# Patient Record
Sex: Male | Born: 1940
Health system: Southern US, Community
[De-identification: ages and names within clinical notes are randomized; demographics above are authoritative.]

## PROBLEM LIST (undated history)

## (undated) DIAGNOSIS — K089 Disorder of teeth and supporting structures, unspecified: Secondary | ICD-10-CM

## (undated) DIAGNOSIS — H7013 Chronic mastoiditis, bilateral: Secondary | ICD-10-CM

## (undated) DIAGNOSIS — E041 Nontoxic single thyroid nodule: Secondary | ICD-10-CM

## (undated) DIAGNOSIS — N189 Chronic kidney disease, unspecified: Secondary | ICD-10-CM

## (undated) DIAGNOSIS — E785 Hyperlipidemia, unspecified: Secondary | ICD-10-CM

## (undated) DIAGNOSIS — M272 Inflammatory conditions of jaws: Secondary | ICD-10-CM

## (undated) DIAGNOSIS — M549 Dorsalgia, unspecified: Secondary | ICD-10-CM

## (undated) DIAGNOSIS — F419 Anxiety disorder, unspecified: Secondary | ICD-10-CM

## (undated) DIAGNOSIS — I10 Essential (primary) hypertension: Secondary | ICD-10-CM

## (undated) DIAGNOSIS — F17210 Nicotine dependence, cigarettes, uncomplicated: Secondary | ICD-10-CM

## (undated) DIAGNOSIS — J449 Chronic obstructive pulmonary disease, unspecified: Secondary | ICD-10-CM

## (undated) DIAGNOSIS — M109 Gout, unspecified: Secondary | ICD-10-CM

## (undated) HISTORY — DX: Morbid (severe) obesity due to excess calories: E66.01

## (undated) HISTORY — PX: TONSILLECTOMY: SUR1361

## (undated) HISTORY — DX: Nontoxic single thyroid nodule: E04.1

## (undated) HISTORY — DX: Essential (primary) hypertension: I10

## (undated) HISTORY — DX: Chronic mastoiditis, bilateral: H70.13

## (undated) HISTORY — DX: Gout, unspecified: M10.9

## (undated) HISTORY — DX: Anxiety disorder, unspecified: F41.9

## (undated) HISTORY — DX: Hyperlipidemia, unspecified: E78.5

## (undated) HISTORY — DX: Inflammatory conditions of jaws: M27.2

## (undated) HISTORY — PX: APPENDECTOMY: SHX54

## (undated) HISTORY — DX: Disorder of teeth and supporting structures, unspecified: K08.9

## (undated) HISTORY — DX: Chronic obstructive pulmonary disease, unspecified: J44.9

## (undated) HISTORY — DX: Nicotine dependence, cigarettes, uncomplicated: F17.210

## (undated) HISTORY — DX: Dorsalgia, unspecified: M54.9

---

## 2003-05-17 ENCOUNTER — Ambulatory Visit (HOSPITAL_COMMUNITY): Admission: RE | Admit: 2003-05-17 | Discharge: 2003-05-17 | Payer: Self-pay | Admitting: Family Medicine

## 2006-07-22 ENCOUNTER — Ambulatory Visit: Payer: Self-pay | Admitting: Cardiology

## 2006-07-30 ENCOUNTER — Ambulatory Visit: Payer: Self-pay

## 2010-08-06 NOTE — Assessment & Plan Note (Signed)
[facility]                            CARDIOLOGY OFFICE NOTE   NAME:Marcus Carr, Marcus Carr                   MRN:          [id number]  DATE:07/22/2006                            DOB:          1940/09/02    PRIMARY CARE PHYSICIAN:  Lindaann Pascal P.A.   REASON FOR PRESENTATION:  Patient with dyspnea and multiple  cardiovascular risk factors.   HISTORY OF PRESENT ILLNESS:  The patient is a 70 year old gentleman  without prior cardiac history.  He has significant cardiovascular risk  factors. He has had some dyspnea.  He notices this doing moderate  activity or activity such as climbing a flight of stairs.  He has been  very limited over the years because of spinal stenosis and he is on  disability for this.  He thinks his dyspnea has slowly been progressive.  He does not describe resting shortness of breath.  He does not have PND  or orthopnea.  He has had palpations long standing but these are  fleeting and sporadic. They are not associated with presyncope or  syncope.  He does not describe chest pressure, neck or arm discomfort.   PAST MEDICAL HISTORY:  Hypertension  x20 years, hyperlipidemia x20  years, gout, depression x10 years, panic attacks.   PAST SURGICAL HISTORY:  Tonsillectomy, adenoidectomy, appendectomy, skin  cancer resected.   ALLERGIES:  PENICILLIN.   MEDICATIONS:  1. Oxycodone.  2. Advicor 1000/20 daily.  3. Hydrochlorothiazide 12.5 mg daily.  4. Lexapro 10 mg daily.  5. Aspirin 325 mg daily.  6. Fish oil 1000 mg daily.  7. Verapamil 240 mg b.i.d.  8. Uric acid.  9. Stool softener.  10.Chantix.   SOCIAL HISTORY:  The patient is disabled.  He is married.  He has  children.  He has been smoking 1-1/2 packs a day of cigarettes for 40  years.   FAMILY HISTORY:  Is contributory for his father having multiple  myocardial infarctions and dying with heart failure at age 54.  He has a  brother who apparently had heart failure, but by  the patient's report,  died with a dissected thoracic aneurysm at age 45.   REVIEW OF SYSTEMS:  As stated in the HPI and negative for other systems.   PHYSICAL EXAMINATION:  The patient is in no distress.  Blood pressure 134/80, heart rate 66 and regular.  Weight 250 pounds.  Body mass index 37.  HEENT:  Eyes unremarkable. Pupils are equal, round and react to light,  fundi not visualized. Oral mucosa unremarkable.  NECK:  No jugular venous distention, wave form within normal limits,  carotid upstroke brisk and symmetric. No bruits, no thyromegaly.  LYMPHATICS:  No cervical, axillary, inguinal adenopathy.  LUNGS:  Clear to auscultation bilaterally.  BACK:  No costovertebral angle tenderness.  CHEST:  Unremarkable.  HEART:  PMI not displaced or sustained, S1 and S2 within normal limits.  No S3, No S4, no clicks, no rubs, no murmurs.  ABDOMEN:  Obese, positive bowel sounds.  Normal in frequency and pitch,  no bruits, no rebound, no guarding, no midline pulsatile mass, no  organomegaly.  SKIN:  No rashes.  No nodules.  EXTREMITIES:  2+ pulses, no cyanosis, no clubbing, no edema.  NEURO:  Oriented to person, place and time.  Cranial nerves II through  XII grossly intact, motor grossly intact.   EKG sinus rhythm, rate 66, axis within normal limits. Intervals within  normal limits, no acute ST-T wave changes.   ASSESSMENT AND PLAN:  1. Dyspnea.  The patient has dyspnea which is probably related to his      deconditioning and smoking.  However, it could be an anginal      equivalent.  It has been slowly progressive.  It happens with      minimal activity.  He has multiple severe cardiovascular risk      factors.  He would need to be screened with a stress test.      However, he could not walk on a treadmill because of back pain.      Therefore, he will have an adenosine Cardiolite.  Further      evaluation based on these results.  2. Tobacco.  We discussed the need to stop smoking.  He  is taking      Chantix.  Hopefully he can commit to this.  3. Obesity.  He understands the need to lose weight with diet and      exercise.  I am asking him to look into whether he has access to a      swimming pool where he could exercise despite his back pain.      Otherwise he will need a more restrictive diet and we have      discussed this.  4. Hypertension.  Blood pressure is well controlled.  He will continue      the medications as listed.  5. Dyslipidemia per Mr. Jacqulyn Bath.  6. Followup.  I will see the patient back as needed.    Rollene Rotunda, MD, [facility]  Electronically Signed   JH/MedQ  DD: 07/22/2006  DT: 07/22/2006  Job #: [id number]   cc:   Lindaann Pascal, P.A.

## 2012-09-14 ENCOUNTER — Other Ambulatory Visit: Payer: Self-pay | Admitting: Nurse Practitioner

## 2012-11-30 ENCOUNTER — Other Ambulatory Visit: Payer: Self-pay | Admitting: Nurse Practitioner

## 2012-11-30 NOTE — Telephone Encounter (Signed)
Last seen 2/14 [facility]   

## 2012-12-01 NOTE — Telephone Encounter (Signed)
Prescription renewed in EPIC. 

## 2012-12-02 NOTE — Telephone Encounter (Signed)
Rx done, pt aware 

## 2012-12-07 ENCOUNTER — Other Ambulatory Visit: Payer: Self-pay

## 2012-12-07 NOTE — Telephone Encounter (Signed)
Last lipids 04/27/12  [facility]

## 2012-12-08 ENCOUNTER — Other Ambulatory Visit: Payer: Self-pay | Admitting: Nurse Practitioner

## 2012-12-09 NOTE — Telephone Encounter (Signed)
Last lipid 04/27/12  [facility]

## 2012-12-10 MED ORDER — SIMVASTATIN 40 MG PO TABS: 40.0000 mg | ORAL_TABLET | Freq: Every day | ORAL | Status: DC

## 2012-12-16 ENCOUNTER — Ambulatory Visit (INDEPENDENT_AMBULATORY_CARE_PROVIDER_SITE_OTHER): Payer: Medicare Other | Admitting: Family Medicine

## 2012-12-16 ENCOUNTER — Encounter: Payer: Self-pay | Admitting: Family Medicine

## 2012-12-16 VITALS — BP 124/71 | HR 59 | Temp 96.7°F | Ht 67.5 in | Wt 237.0 lb

## 2012-12-16 DIAGNOSIS — F32A Depression, unspecified: Secondary | ICD-10-CM

## 2012-12-16 DIAGNOSIS — F329 Major depressive disorder, single episode, unspecified: Secondary | ICD-10-CM

## 2012-12-16 DIAGNOSIS — M109 Gout, unspecified: Secondary | ICD-10-CM

## 2012-12-16 DIAGNOSIS — E785 Hyperlipidemia, unspecified: Secondary | ICD-10-CM

## 2012-12-16 DIAGNOSIS — F3289 Other specified depressive episodes: Secondary | ICD-10-CM

## 2012-12-16 DIAGNOSIS — I10 Essential (primary) hypertension: Secondary | ICD-10-CM

## 2012-12-16 LAB — POCT CBC
Granulocyte percent: 61.9 %G (ref 37–80)
HCT, POC: 44 % (ref 43.5–53.7)
Hemoglobin: 14.2 g/dL (ref 14.1–18.1)
Lymph, poc: 1.4 (ref 0.6–3.4)
MCH, POC: 29.8 pg (ref 27–31.2)
MCHC: 32.4 g/dL (ref 31.8–35.4)
MCV: 92 fL (ref 80–97)
MPV: 6.6 fL (ref 0–99.8)
POC Granulocyte: 2.8 (ref 2–6.9)
POC LYMPH PERCENT: 31.1 %L (ref 10–50)
Platelet Count, POC: 129 10*3/uL — AB (ref 142–424)
RBC: 4.8 M/uL (ref 4.69–6.13)
RDW, POC: 14 %
WBC: 4.6 10*3/uL (ref 4.6–10.2)

## 2012-12-16 MED ORDER — HYDROCHLOROTHIAZIDE 25 MG PO TABS: 25.0000 mg | ORAL_TABLET | Freq: Every day | ORAL | Status: DC

## 2012-12-16 MED ORDER — ALLOPURINOL 300 MG PO TABS: 300.0000 mg | ORAL_TABLET | Freq: Every day | ORAL | Status: DC

## 2012-12-16 MED ORDER — SIMVASTATIN 40 MG PO TABS: 40.0000 mg | ORAL_TABLET | Freq: Every day | ORAL | Status: DC

## 2012-12-16 MED ORDER — VERAPAMIL HCL ER 240 MG PO CP24: 240.0000 mg | ORAL_CAPSULE | Freq: Every day | ORAL | Status: DC

## 2012-12-16 MED ORDER — CITALOPRAM HYDROBROMIDE 20 MG PO TABS: 20.0000 mg | ORAL_TABLET | Freq: Every day | ORAL | Status: DC

## 2012-12-16 MED ORDER — NIACIN ER (ANTIHYPERLIPIDEMIC) 1000 MG PO TBCR: 1000.0000 mg | EXTENDED_RELEASE_TABLET | Freq: Every day | ORAL | Status: DC

## 2012-12-16 NOTE — Progress Notes (Signed)
  Subjective:    Patient ID: Marcus Carr, male    DOB: [birth year], 72 y.o.   MRN: [id number]  HPI  This 72 y.o. male presents for evaluation of hypertension, hyperlipidemia, gout, obesity, Tobacco abuse, and depression.  He states his depression is under control.  He has Been doing fine with gout and has not had any flares.  His last uric acid was elevated At 7.2 but has not had any gout flares and takes allopurinol 300mg  po qd.  He had a cardiac W/u 5 years ago he states by [employer] cardiolgy.  He states he still does not want a colonoscopy. He needs labs and refills on meds.  He has no acute c/o. He denies SOB, hemoptysis and chest pain.  Review of Systems    No chest pain, SOB, HA, dizziness, vision change, N/V, diarrhea, constipation, dysuria, urinary urgency or frequency, myalgias, arthralgias or rash.  Objective:   Physical Exam Vital signs noted  Well developed well nourished male.  HEENT - Head atraumatic Normocephalic                Eyes - PERRLA, Conjuctiva - clear Sclera- Clear EOMI                Ears - EAC's Wnl TM's Wnl Gross Hearing WNL                Nose - Nares patent                 Throat - oropharanx wnl Respiratory - Lungs CTA bilateral Cardiac - RRR S1 and S2 without murmur GI - Abdomen soft Nontender and bowel sounds active x 4 Extremities - No edema. Neuro - Grossly intact.       Assessment & Plan:  Essential hypertension, benign - Plan: POCT CBC, Thyroid Panel With TSH, CMP14+EGFR  Other and unspecified hyperlipidemia - Plan: simvastatin (ZOCOR) 40 MG tablet, niacin (NIASPAN) 1000 MG CR tablet, Thyroid Panel With TSH, Lipid panel, CMP14+EGFR  Unspecified essential hypertension - Plan: hydrochlorothiazide (HYDRODIURIL) 25 MG tablet, verapamil (VERELAN PM) 240 MG 24 hr capsule, Thyroid Panel With TSH  Depression - Plan: citalopram (CELEXA) 20 MG tablet, Thyroid Panel With TSH  Gout - Plan: allopurinol (ZYLOPRIM) 300 MG tablet.  Tobacco  abuse - Discussed quitting smoking.    Follow up in 6 months  Deatra Canter FNP

## 2012-12-16 NOTE — Patient Instructions (Signed)

## 2012-12-17 ENCOUNTER — Telehealth: Payer: Self-pay | Admitting: Family Medicine

## 2012-12-17 LAB — CMP14+EGFR
ALT: 17 IU/L (ref 0–44)
AST: 22 IU/L (ref 0–40)
Albumin/Globulin Ratio: 2.2 (ref 1.1–2.5)
Albumin: 4.6 g/dL (ref 3.5–4.8)
Alkaline Phosphatase: 72 IU/L (ref 39–117)
BUN/Creatinine Ratio: 14 (ref 10–22)
BUN: 15 mg/dL (ref 8–27)
CO2: 26 mmol/L (ref 18–29)
Calcium: 9.7 mg/dL (ref 8.6–10.2)
Chloride: 98 mmol/L (ref 97–108)
Creatinine, Ser: 1.1 mg/dL (ref 0.76–1.27)
GFR calc Af Amer: 78 mL/min/{1.73_m2} (ref >59)
GFR calc non Af Amer: 68 mL/min/{1.73_m2} (ref >59)
Globulin, Total: 2.1 g/dL (ref 1.5–4.5)
Glucose: 104 mg/dL — ABNORMAL HIGH (ref 65–99)
Potassium: 4.4 mmol/L (ref 3.5–5.2)
Sodium: 141 mmol/L (ref 134–144)
Total Bilirubin: 0.5 mg/dL (ref 0.0–1.2)
Total Protein: 6.7 g/dL (ref 6.0–8.5)

## 2012-12-17 LAB — THYROID PANEL WITH TSH
Free Thyroxine Index: 1.8 (ref 1.2–4.9)
T3 Uptake Ratio: 33 % (ref 24–39)
T4, Total: 5.5 ug/dL (ref 4.5–12.0)
TSH: 2.07 u[IU]/mL (ref 0.450–4.500)

## 2012-12-17 LAB — LIPID PANEL
Chol/HDL Ratio: 2.8 ratio units (ref 0.0–5.0)
Cholesterol, Total: 110 mg/dL (ref 100–199)
HDL: 40 mg/dL (ref >39)
LDL Calculated: 49 mg/dL (ref 0–99)
Triglycerides: 103 mg/dL (ref 0–149)
VLDL Cholesterol Cal: 21 mg/dL (ref 5–40)

## 2012-12-20 ENCOUNTER — Other Ambulatory Visit: Payer: Self-pay | Admitting: Family Medicine

## 2012-12-20 MED ORDER — AZITHROMYCIN 250 MG PO TABS: ORAL_TABLET | ORAL | Status: DC

## 2013-01-18 ENCOUNTER — Telehealth: Payer: Self-pay | Admitting: Family Medicine

## 2013-01-20 NOTE — Telephone Encounter (Signed)
DONE

## 2013-01-20 NOTE — Telephone Encounter (Signed)
PT NOTIFIED ABOUT LABS AND TO HAVE RCK. VERBALIZED UNDERSTANDING.

## 2013-06-09 ENCOUNTER — Other Ambulatory Visit: Payer: Self-pay | Admitting: Family Medicine

## 2013-09-22 ENCOUNTER — Other Ambulatory Visit: Payer: Self-pay

## 2013-09-22 DIAGNOSIS — I10 Essential (primary) hypertension: Secondary | ICD-10-CM

## 2013-09-22 NOTE — Telephone Encounter (Signed)
Last seen 12/16/12  [facility]  Requesting 90 day supply

## 2013-09-26 MED ORDER — VERAPAMIL HCL ER 240 MG PO CP24: 240.0000 mg | ORAL_CAPSULE | Freq: Every day | ORAL | Status: DC

## 2013-12-09 ENCOUNTER — Other Ambulatory Visit: Payer: Self-pay | Admitting: Family Medicine

## 2014-01-12 ENCOUNTER — Ambulatory Visit: Payer: Medicare Other | Admitting: Family Medicine

## 2014-01-18 ENCOUNTER — Other Ambulatory Visit: Payer: Self-pay | Admitting: Family Medicine

## 2014-01-20 ENCOUNTER — Other Ambulatory Visit: Payer: Self-pay | Admitting: *Deleted

## 2014-01-20 ENCOUNTER — Telehealth: Payer: Self-pay | Admitting: Family Medicine

## 2014-01-20 DIAGNOSIS — I10 Essential (primary) hypertension: Secondary | ICD-10-CM

## 2014-01-20 MED ORDER — VERAPAMIL HCL ER 240 MG PO CP24: 240.0000 mg | ORAL_CAPSULE | Freq: Every day | ORAL | Status: DC

## 2014-01-20 NOTE — Telephone Encounter (Signed)
Patient notified that rx sent to pharmacy 

## 2014-01-30 ENCOUNTER — Other Ambulatory Visit: Payer: Self-pay | Admitting: Family Medicine

## 2014-01-31 NOTE — Telephone Encounter (Signed)
Pt hasn't been seen since 11/2012, he has appt with [facility] 11/16, wants refill on celexa, ok to refill till appt?

## 2014-02-06 ENCOUNTER — Encounter: Payer: Self-pay | Admitting: Family Medicine

## 2014-02-06 ENCOUNTER — Encounter (INDEPENDENT_AMBULATORY_CARE_PROVIDER_SITE_OTHER): Payer: Self-pay

## 2014-02-06 ENCOUNTER — Ambulatory Visit (INDEPENDENT_AMBULATORY_CARE_PROVIDER_SITE_OTHER): Payer: Medicare Other | Admitting: Family Medicine

## 2014-02-06 VITALS — BP 133/70 | HR 65 | Temp 97.0°F | Ht 67.5 in | Wt 251.4 lb

## 2014-02-06 DIAGNOSIS — E785 Hyperlipidemia, unspecified: Secondary | ICD-10-CM

## 2014-02-06 DIAGNOSIS — I10 Essential (primary) hypertension: Secondary | ICD-10-CM

## 2014-02-06 DIAGNOSIS — R5383 Other fatigue: Secondary | ICD-10-CM

## 2014-02-06 DIAGNOSIS — M1000 Idiopathic gout, unspecified site: Secondary | ICD-10-CM

## 2014-02-06 DIAGNOSIS — Z0000 Encounter for general adult medical examination without abnormal findings: Secondary | ICD-10-CM

## 2014-02-06 DIAGNOSIS — Z139 Encounter for screening, unspecified: Secondary | ICD-10-CM

## 2014-02-06 MED ORDER — VERAPAMIL HCL ER 240 MG PO CP24: 240.0000 mg | ORAL_CAPSULE | Freq: Every day | ORAL | Status: DC

## 2014-02-06 MED ORDER — NIACIN ER (ANTIHYPERLIPIDEMIC) 1000 MG PO TBCR: EXTENDED_RELEASE_TABLET | ORAL | Status: DC

## 2014-02-06 MED ORDER — SIMVASTATIN 40 MG PO TABS: 40.0000 mg | ORAL_TABLET | Freq: Every day | ORAL | Status: DC

## 2014-02-06 MED ORDER — ALLOPURINOL 300 MG PO TABS: 300.0000 mg | ORAL_TABLET | Freq: Every day | ORAL | Status: DC

## 2014-02-06 MED ORDER — HYDROCHLOROTHIAZIDE 25 MG PO TABS: ORAL_TABLET | ORAL | Status: DC

## 2014-02-06 MED ORDER — CITALOPRAM HYDROBROMIDE 20 MG PO TABS: ORAL_TABLET | ORAL | Status: DC

## 2014-02-06 NOTE — Addendum Note (Signed)
Addended by: Selmer Dominion on: 02/06/2014 09:47 AM   Modules accepted: Orders

## 2014-02-06 NOTE — Addendum Note (Signed)
Addended by: Marin Olp on: 02/06/2014 08:30 AM   Modules accepted: Orders

## 2014-02-06 NOTE — Progress Notes (Signed)
   Subjective:    Patient ID: Marcus Carr, male    DOB: [birth year], 73 y.o.   MRN: [id number]  HPI Patient is here for routine CPE.  He has hx of hyperlipidemia, hypertension, and vitamin D deficiency.  He is due for labs.  He has no acute medical problems.  Review of Systems  Constitutional: Negative for fever.  HENT: Negative for ear pain.   Eyes: Negative for discharge.  Respiratory: Negative for cough.   Cardiovascular: Negative for chest pain.  Gastrointestinal: Negative for abdominal distention.  Endocrine: Negative for polyuria.  Genitourinary: Negative for difficulty urinating.  Musculoskeletal: Negative for gait problem and neck pain.  Skin: Negative for color change and rash.  Neurological: Negative for speech difficulty and headaches.  Psychiatric/Behavioral: Negative for agitation.       Objective:    BP 133/70 mmHg  Pulse 65  Temp(Src) 97 F (36.1 C) (Oral)  Ht 5' 7.5" (1.715 m)  Wt 251 lb 6.4 oz (114.034 kg)  BMI 38.77 kg/m2 Physical Exam  Constitutional: He is oriented to person, place, and time. He appears well-developed and well-nourished.  HENT:  Head: Normocephalic and atraumatic.  Mouth/Throat: Oropharynx is clear and moist.  Eyes: Pupils are equal, round, and reactive to light.  Neck: Normal range of motion. Neck supple.  Cardiovascular: Normal rate and regular rhythm.   No murmur heard. Pulmonary/Chest: Effort normal and breath sounds normal.  Abdominal: Soft. Bowel sounds are normal. There is no tenderness.  Neurological: He is alert and oriented to person, place, and time.  Skin: Skin is warm and dry.  Psychiatric: He has a normal mood and affect.          Assessment & Plan:     ICD-9-CM ICD-10-CM   1. Screening V82.9 Z13.9 PSA, total and free     Thyroid Panel With TSH  2. Essential hypertension, benign 401.1 I10 POCT CBC     CMP14+EGFR  3. Hyperlipemia 272.4 E78.5 CMP14+EGFR     Lipid panel  4. Other fatigue 780.79 R53.83  POCT CBC     Thyroid Panel With TSH     No Follow-up on file.  Lysbeth Penner FNP

## 2014-02-07 LAB — LIPID PANEL
Chol/HDL Ratio: 2.9 ratio units (ref 0.0–5.0)
Cholesterol, Total: 115 mg/dL (ref 100–199)
HDL: 39 mg/dL — ABNORMAL LOW (ref >39)
LDL Calculated: 55 mg/dL (ref 0–99)
Triglycerides: 105 mg/dL (ref 0–149)
VLDL Cholesterol Cal: 21 mg/dL (ref 5–40)

## 2014-02-07 LAB — CBC WITH DIFFERENTIAL
Basophils Absolute: 0 10*3/uL (ref 0.0–0.2)
Basos: 0 %
Eos: 0 %
Eosinophils Absolute: 0 10*3/uL (ref 0.0–0.4)
HCT: 40.9 % (ref 37.5–51.0)
Hemoglobin: 13.8 g/dL (ref 12.6–17.7)
Immature Grans (Abs): 0 10*3/uL (ref 0.0–0.1)
Immature Granulocytes: 0 %
Lymphocytes Absolute: 1.7 10*3/uL (ref 0.7–3.1)
Lymphs: 35 %
MCH: 30.7 pg (ref 26.6–33.0)
MCHC: 33.7 g/dL (ref 31.5–35.7)
MCV: 91 fL (ref 79–97)
Monocytes Absolute: 0.5 10*3/uL (ref 0.1–0.9)
Monocytes: 11 %
Neutrophils Absolute: 2.6 10*3/uL (ref 1.4–7.0)
Neutrophils Relative %: 54 %
Platelets: 140 10*3/uL — ABNORMAL LOW (ref 150–379)
RBC: 4.49 x10E6/uL (ref 4.14–5.80)
RDW: 14.5 % (ref 12.3–15.4)
WBC: 4.8 10*3/uL (ref 3.4–10.8)

## 2014-02-07 LAB — CMP14+EGFR
ALT: 18 IU/L (ref 0–44)
AST: 24 IU/L (ref 0–40)
Albumin/Globulin Ratio: 1.8 (ref 1.1–2.5)
Albumin: 4.5 g/dL (ref 3.5–4.8)
Alkaline Phosphatase: 75 IU/L (ref 39–117)
BUN/Creatinine Ratio: 17 (ref 10–22)
BUN: 21 mg/dL (ref 8–27)
CO2: 24 mmol/L (ref 18–29)
Calcium: 9.3 mg/dL (ref 8.6–10.2)
Chloride: 97 mmol/L (ref 97–108)
Creatinine, Ser: 1.26 mg/dL (ref 0.76–1.27)
GFR calc Af Amer: 66 mL/min/{1.73_m2} (ref >59)
GFR calc non Af Amer: 57 mL/min/{1.73_m2} — ABNORMAL LOW (ref >59)
Globulin, Total: 2.5 g/dL (ref 1.5–4.5)
Glucose: 123 mg/dL — ABNORMAL HIGH (ref 65–99)
Potassium: 4.1 mmol/L (ref 3.5–5.2)
Sodium: 138 mmol/L (ref 134–144)
Total Bilirubin: 0.4 mg/dL (ref 0.0–1.2)
Total Protein: 7 g/dL (ref 6.0–8.5)

## 2014-02-07 LAB — THYROID PANEL WITH TSH
Free Thyroxine Index: 1.9 (ref 1.2–4.9)
T3 Uptake Ratio: 30 % (ref 24–39)
T4, Total: 6.4 ug/dL (ref 4.5–12.0)
TSH: 2.26 u[IU]/mL (ref 0.450–4.500)

## 2014-02-07 LAB — PSA, TOTAL AND FREE
PSA, Free Pct: 60 %
PSA, Free: 0.42 ng/mL
PSA: 0.7 ng/mL (ref 0.0–4.0)

## 2014-03-07 ENCOUNTER — Other Ambulatory Visit: Payer: Self-pay | Admitting: Family Medicine

## 2014-03-07 ENCOUNTER — Telehealth: Payer: Self-pay | Admitting: Family Medicine

## 2014-03-07 DIAGNOSIS — I10 Essential (primary) hypertension: Secondary | ICD-10-CM

## 2014-03-07 MED ORDER — VERAPAMIL HCL ER 240 MG PO CP24: ORAL_CAPSULE | ORAL | Status: DC

## 2014-03-07 NOTE — Telephone Encounter (Signed)
rx fixed and sent to pharm

## 2014-03-07 NOTE — Telephone Encounter (Signed)
Aware,script at pharmacy.

## 2014-04-07 ENCOUNTER — Other Ambulatory Visit: Payer: Self-pay | Admitting: Family Medicine

## 2014-04-07 ENCOUNTER — Telehealth: Payer: Self-pay | Admitting: Family Medicine

## 2014-04-07 NOTE — Telephone Encounter (Signed)
Labs show elevated glucose not diabetic range and other labs normal.

## 2014-04-08 NOTE — Telephone Encounter (Signed)
Patient wife aware of results and copy of labs have been mailed to patient.

## 2014-06-21 ENCOUNTER — Other Ambulatory Visit: Payer: Self-pay | Admitting: Family Medicine

## 2014-06-23 ENCOUNTER — Ambulatory Visit (INDEPENDENT_AMBULATORY_CARE_PROVIDER_SITE_OTHER): Payer: Medicare Other | Admitting: Family

## 2014-06-23 ENCOUNTER — Encounter: Payer: Self-pay | Admitting: Family

## 2014-06-23 VITALS — BP 126/77 | HR 75 | Temp 98.5°F | Ht 67.5 in | Wt 248.2 lb

## 2014-06-23 DIAGNOSIS — J0100 Acute maxillary sinusitis, unspecified: Secondary | ICD-10-CM

## 2014-06-23 DIAGNOSIS — I10 Essential (primary) hypertension: Secondary | ICD-10-CM

## 2014-06-23 MED ORDER — METHYLPREDNISOLONE (PAK) 4 MG PO TABS: ORAL_TABLET | ORAL | Status: DC

## 2014-06-23 MED ORDER — VERAPAMIL HCL ER 240 MG PO CP24: ORAL_CAPSULE | ORAL | Status: DC

## 2014-06-23 MED ORDER — AZITHROMYCIN 250 MG PO TABS: ORAL_TABLET | ORAL | Status: DC

## 2014-06-23 MED ORDER — HYDROCHLOROTHIAZIDE 12.5 MG PO TABS: 12.5000 mg | ORAL_TABLET | Freq: Every day | ORAL | Status: DC

## 2014-06-23 NOTE — Progress Notes (Signed)
Subjective:    Patient ID: Marcus Carr, male    DOB: 02/24/1941, 74 y.o.   MRN: [id number]  Otalgia  There is pain in the right ear. This is a new problem. The current episode started in the past 7 days ("about 4 days ago"). The problem occurs every few hours. The problem has been unchanged. There has been no fever. The pain is at a severity of 3/10. The pain is mild. Associated symptoms include coughing, hearing loss, rhinorrhea and a sore throat. Pertinent negatives include no ear discharge, headaches or vomiting. He has tried acetaminophen and antibiotics for the symptoms. The treatment provided mild relief.      Review of Systems  Constitutional: Negative.   HENT: Positive for ear pain, hearing loss, rhinorrhea and sore throat. Negative for ear discharge.   Respiratory: Positive for cough.   Cardiovascular: Negative.   Gastrointestinal: Negative.  Negative for vomiting.  Endocrine: Negative.   Genitourinary: Negative.   Musculoskeletal: Negative.   Neurological: Negative.  Negative for headaches.  Hematological: Negative.   Psychiatric/Behavioral: Negative.   All other systems reviewed and are negative.      Objective:   Physical Exam  Constitutional: He is oriented to person, place, and time. He appears well-developed and well-nourished. No distress.  HENT:  Head: Normocephalic.  Right Ear: External ear normal.  Left Ear: External ear normal.  Nose: Right sinus exhibits maxillary sinus tenderness.  Nasal passage erythemas with mild swelling  Oropharynx erythemas  Eyes: Pupils are equal, round, and reactive to light. Right eye exhibits no discharge. Left eye exhibits no discharge.  Neck: Normal range of motion. Neck supple. No thyromegaly present.  Cardiovascular: Normal rate, regular rhythm, normal heart sounds and intact distal pulses.   No murmur heard. Pulmonary/Chest: Effort normal and breath sounds normal. No respiratory distress. He has no wheezes.    Abdominal: Soft. Bowel sounds are normal. He exhibits no distension. There is no tenderness.  Musculoskeletal: Normal range of motion. He exhibits no edema or tenderness.  Neurological: He is alert and oriented to person, place, and time. He has normal reflexes. No cranial nerve deficit.  Skin: Skin is warm and dry. No rash noted. No erythema.  Psychiatric: He has a normal mood and affect. His behavior is normal. Judgment and thought content normal.  Vitals reviewed.     BP 126/77 mmHg  Pulse 75  Temp(Src) 98.5 F (36.9 C) (Oral)  Ht 5' 7.5" (1.715 m)  Wt 248 lb 3.2 oz (112.583 kg)  BMI 38.28 kg/m2     Assessment & Plan:  1. Essential hypertension - verapamil (VERELAN PM) 240 MG 24 hr capsule; One po bid  Dispense: 60 capsule; Refill: 5 - hydrochlorothiazide (HYDRODIURIL) 12.5 MG tablet; Take 1 tablet (12.5 mg total) by mouth daily.  Dispense: 90 tablet; Refill: 3  2. Acute maxillary sinusitis, recurrence not specified -- Take meds as prescribed - Use a cool mist humidifier  -Use saline nose sprays frequently -Saline irrigations of the nose can be very helpful if done frequently.  * 4X daily for 1 week*  * Use of a nettie pot can be helpful with this. Follow directions with this* -Force fluids -For any cough or congestion  Use plain Mucinex- regular strength or max strength is fine   * Children- consult with Pharmacist for dosing -For fever or aces or pains- take tylenol or ibuprofen appropriate for age and weight.  * for fevers greater than 101 orally you may alternate ibuprofen and  tylenol every  3 hours. -Throat lozenges if help - azithromycin (ZITHROMAX) 250 MG tablet; Take 500 mg once, then 250 mg for four days  Dispense: 6 tablet; Refill: 0 - methylPREDNIsolone (MEDROL DOSPACK) 4 MG tablet; follow package directions  Dispense: 21 tablet; Refill: 0  [name], FNP

## 2014-06-23 NOTE — Patient Instructions (Signed)
Sinusitis Sinusitis is redness, soreness, and inflammation of the paranasal sinuses. Paranasal sinuses are air pockets within the bones of your face (beneath the eyes, the middle of the forehead, or above the eyes). In healthy paranasal sinuses, mucus is able to drain out, and air is able to circulate through them by way of your nose. However, when your paranasal sinuses are inflamed, mucus and air can become trapped. This can allow bacteria and other germs to grow and cause infection. Sinusitis can develop quickly and last only a short time (acute) or continue over a long period (chronic). Sinusitis that lasts for more than 12 weeks is considered chronic.  CAUSES  Causes of sinusitis include:  Allergies.  Structural abnormalities, such as displacement of the cartilage that separates your nostrils (deviated septum), which can decrease the air flow through your nose and sinuses and affect sinus drainage.  Functional abnormalities, such as when the small hairs (cilia) that line your sinuses and help remove mucus do not work properly or are not present. SIGNS AND SYMPTOMS  Symptoms of acute and chronic sinusitis are the same. The primary symptoms are pain and pressure around the affected sinuses. Other symptoms include:  Upper toothache.  Earache.  Headache.  Bad breath.  Decreased sense of smell and taste.  A cough, which worsens when you are lying flat.  Fatigue.  Fever.  Thick drainage from your nose, which often is green and may contain pus (purulent).  Swelling and warmth over the affected sinuses. DIAGNOSIS  Your health care provider will perform a physical exam. During the exam, your health care provider may:  Look in your nose for signs of abnormal growths in your nostrils (nasal polyps).  Tap over the affected sinus to check for signs of infection.  View the inside of your sinuses (endoscopy) using an imaging device that has a light attached (endoscope). If your health  care provider suspects that you have chronic sinusitis, one or more of the following tests may be recommended:  Allergy tests.  Nasal culture. A sample of mucus is taken from your nose, sent to a lab, and screened for bacteria.  Nasal cytology. A sample of mucus is taken from your nose and examined by your health care provider to determine if your sinusitis is related to an allergy. TREATMENT  Most cases of acute sinusitis are related to a viral infection and will resolve on their own within 10 days. Sometimes medicines are prescribed to help relieve symptoms (pain medicine, decongestants, nasal steroid sprays, or saline sprays).  However, for sinusitis related to a bacterial infection, your health care provider will prescribe antibiotic medicines. These are medicines that will help kill the bacteria causing the infection.  Rarely, sinusitis is caused by a fungal infection. In theses cases, your health care provider will prescribe antifungal medicine. For some cases of chronic sinusitis, surgery is needed. Generally, these are cases in which sinusitis recurs more than 3 times per year, despite other treatments. HOME CARE INSTRUCTIONS   Drink plenty of water. Water helps thin the mucus so your sinuses can drain more easily.  Use a humidifier.  Inhale steam 3 to 4 times a day (for example, sit in the bathroom with the shower running).  Apply a warm, moist washcloth to your face 3 to 4 times a day, or as directed by your health care provider.  Use saline nasal sprays to help moisten and clean your sinuses.  Take medicines only as directed by your health care provider.    If you were prescribed either an antibiotic or antifungal medicine, finish it all even if you start to feel better. SEEK IMMEDIATE MEDICAL CARE IF:  You have increasing pain or severe headaches.  You have nausea, vomiting, or drowsiness.  You have swelling around your face.  You have vision problems.  You have a stiff  neck.  You have difficulty breathing. MAKE SURE YOU:   Understand these instructions.  Will watch your condition.  Will get help right away if you are not doing well or get worse. Document Released: 03/10/2005 Document Revised: 07/25/2013 Document Reviewed: 03/25/2011 Exit[employer] Patient Information 2015 [employer]. This information is not intended to replace advice given to you by your health care provider. Make sure you discuss any questions you have with your health care provider.  - Take meds as prescribed - Use a cool mist humidifier  -Use saline nose sprays frequently -Saline irrigations of the nose can be very helpful if done frequently.  * 4X daily for 1 week*  * Use of a nettie pot can be helpful with this. Follow directions with this* -Force fluids -For any cough or congestion  Use plain Mucinex- regular strength or max strength is fine   * Children- consult with Pharmacist for dosing -For fever or aces or pains- take tylenol or ibuprofen appropriate for age and weight.  * for fevers greater than 101 orally you may alternate ibuprofen and tylenol every  3 hours. -Throat lozenges if help   Mutasim Tuckey, FNP  

## 2014-07-07 ENCOUNTER — Telehealth: Payer: Self-pay | Admitting: Family

## 2014-07-07 NOTE — Telephone Encounter (Signed)
Patient offered an appointment for today with [facility] but wanted to wait until Monday appointment scheduled for 4/18 with Christy.

## 2014-07-10 ENCOUNTER — Encounter: Payer: Self-pay | Admitting: Family

## 2014-07-10 ENCOUNTER — Ambulatory Visit (INDEPENDENT_AMBULATORY_CARE_PROVIDER_SITE_OTHER): Payer: Medicare Other | Admitting: Family

## 2014-07-10 VITALS — BP 122/71 | HR 75 | Temp 97.5°F | Ht 67.5 in | Wt 243.0 lb

## 2014-07-10 DIAGNOSIS — N410 Acute prostatitis: Secondary | ICD-10-CM | POA: Diagnosis not present

## 2014-07-10 DIAGNOSIS — K047 Periapical abscess without sinus: Secondary | ICD-10-CM

## 2014-07-10 DIAGNOSIS — R300 Dysuria: Secondary | ICD-10-CM | POA: Diagnosis not present

## 2014-07-10 LAB — POCT URINALYSIS DIPSTICK
BILIRUBIN UA: NEGATIVE
GLUCOSE UA: NEGATIVE
KETONES UA: NEGATIVE
NITRITE UA: NEGATIVE
Protein, UA: NEGATIVE
Spec Grav, UA: 1.01
Urobilinogen, UA: NEGATIVE
pH, UA: 6.5

## 2014-07-10 LAB — POCT UA - MICROSCOPIC ONLY
CRYSTALS, UR, HPF, POC: NEGATIVE
Casts, Ur, LPF, POC: NEGATIVE
Mucus, UA: NEGATIVE

## 2014-07-10 MED ORDER — CIPROFLOXACIN HCL 500 MG PO TABS: 500.0000 mg | ORAL_TABLET | Freq: Two times a day (BID) | ORAL | Status: DC

## 2014-07-10 MED ORDER — CLINDAMYCIN HCL 300 MG PO CAPS: 300.0000 mg | ORAL_CAPSULE | Freq: Four times a day (QID) | ORAL | Status: DC

## 2014-07-10 NOTE — Patient Instructions (Addendum)
Abscessed Tooth An abscessed tooth is an infection around your tooth. It may be caused by holes or damage to the tooth (cavity) or a dental disease. An abscessed tooth causes mild to very bad pain in and around the tooth. See your dentist right away if you have tooth or gum pain. HOME CARE  Take your medicine as told. Finish it even if you start to feel better.  Do not drive after taking pain medicine.  Rinse your mouth (gargle) often with salt water ( teaspoon salt in 8 ounces of warm water).  Do not apply heat to the outside of your face. GET HELP RIGHT AWAY IF:   You have a temperature by mouth above 102 F (38.9 C), not controlled by medicine.  You have chills and a very bad headache.  You have problems breathing or swallowing.  Your mouth will not open.  You develop puffiness (swelling) on the neck or around the eye.  Your pain is not helped by medicine.  Your pain is getting worse instead of better. MAKE SURE YOU:   Understand these instructions.  Will watch your condition.  Will get help right away if you are not doing well or get worse. Document Released: 08/27/2007 Document Revised: 06/02/2011 Document Reviewed: 06/18/2010 [facility] Patient Information 2015 [place], [place]. This information is not intended to replace advice given to you by your health care provider. Make sure you discuss any questions you have with your health care provider.  Prostatitis The prostate gland is about the size and shape of a walnut. It is located just below your bladder. It produces one of the components of semen, which is made up of sperm and the fluids that help nourish and transport it out from the testicles. Prostatitis is inflammation of the prostate gland.  There are four types of prostatitis:  Acute bacterial prostatitis. This is the least common type of prostatitis. It starts quickly and usually is associated with a bladder infection, high fever, and shaking chills. It can occur  at any age.  Chronic bacterial prostatitis. This is a persistent bacterial infection in the prostate. It usually develops from repeated acute bacterial prostatitis or acute bacterial prostatitis that was not properly treated. It can occur in men of any age but is most common in middle-aged men whose prostate has begun to enlarge. The symptoms are not as severe as those in acute bacterial prostatitis. Discomfort in the part of your body that is in front of your rectum and below your scrotum (perineum), lower abdomen, or in the head of your penis (glans) may represent your primary discomfort.  Chronic prostatitis (nonbacterial). This is the most common type of prostatitis. It is inflammation of the prostate gland that is not caused by a bacterial infection. The cause is unknown and may be associated with a viral infection or autoimmune disorder.  Prostatodynia (pelvic floor disorder). This is associated with increased muscular tone in the pelvis surrounding the prostate. CAUSES The causes of bacterial prostatitis are bacterial infection. The causes of the other types of prostatitis are unknown.  SYMPTOMS  Symptoms can vary depending upon the type of prostatitis that exists. There can also be overlap in symptoms. Possible symptoms for each type of prostatitis are listed below. Acute Bacterial Prostatitis  Painful urination.  Fever or chills.  Muscle or joint pains.  Low back pain.  Low abdominal pain.  Inability to empty bladder completely. Chronic Bacterial Prostatitis, Chronic Nonbacterial Prostatitis, and Prostatodynia  Sudden urge to urinate.  Frequent  urination.  Difficulty starting urine stream.  Weak urine stream.  Discharge from the urethra.  Dribbling after urination.  Rectal pain.  Pain in the testicles, penis, or tip of the penis.  Pain in the perineum.  Problems with sexual function.  Painful ejaculation.  Bloody semen. DIAGNOSIS  In order to diagnose  prostatitis, your health care provider will ask about your symptoms. One or more urine samples will be taken and tested (urinalysis). If the urinalysis result is negative for bacteria, your health care provider may use a finger to feel your prostate (digital rectal exam). This exam helps your health care provider determine if your prostate is swollen and tender. It will also produce a specimen of semen that can be analyzed. TREATMENT  Treatment for prostatitis depends on the cause. If a bacterial infection is the cause, it can be treated with antibiotic medicine. In cases of chronic bacterial prostatitis, the use of antibiotics for up to 1 month or 6 weeks may be necessary. Your health care provider may instruct you to take sitz baths to help relieve pain. A sitz bath is a bath of hot water in which your hips and buttocks are under water. This relaxes the pelvic floor muscles and often helps to relieve the pressure on your prostate. HOME CARE INSTRUCTIONS   Take all medicines as directed by your health care provider.  Take sitz baths as directed by your health care provider. SEEK MEDICAL CARE IF:   Your symptoms get worse, not better.  You have a fever. SEEK IMMEDIATE MEDICAL CARE IF:   You have chills.  You feel nauseous or vomit.  You feel lightheaded or faint.  You are unable to urinate.  You have blood or blood clots in your urine. MAKE SURE YOU:  Understand these instructions.  Will watch your condition.  Will get help right away if you are not doing well or get worse. Document Released: 03/07/2000 Document Revised: 03/15/2013 Document Reviewed: 09/27/2012 [facility] Patient Information 2015 [place], [place]. This information is not intended to replace advice given to you by your health care provider. Make sure you discuss any questions you have with your health care provider.

## 2014-07-10 NOTE — Progress Notes (Signed)
Subjective:    Patient ID: Marcus Carr, male    DOB: 09-17-1940, 74 y.o.   MRN: [id number]  Otalgia  There is pain in the right ear. This is a recurrent problem. The current episode started 1 to 4 weeks ago. The problem occurs constantly. The problem has been waxing and waning. There has been no fever. The pain is at a severity of 5/10. The pain is mild. Associated symptoms include headaches. Pertinent negatives include no coughing, diarrhea, ear discharge, hearing loss, neck pain, rhinorrhea, sore throat or vomiting. He has tried acetaminophen for the symptoms. The treatment provided mild relief.  Dysuria  This is a new problem. The current episode started in the past 7 days. The problem occurs intermittently. The problem has been waxing and waning. Associated symptoms include frequency, hesitancy and urgency. Pertinent negatives include no discharge, flank pain, hematuria, nausea or vomiting. The treatment provided no relief.      Review of Systems  Constitutional: Negative.   HENT: Positive for ear pain. Negative for ear discharge, hearing loss, rhinorrhea and sore throat.   Respiratory: Negative.  Negative for cough.   Cardiovascular: Negative.   Gastrointestinal: Negative.  Negative for nausea, vomiting and diarrhea.  Endocrine: Negative.   Genitourinary: Positive for dysuria, hesitancy, urgency and frequency. Negative for hematuria and flank pain.  Musculoskeletal: Negative.  Negative for neck pain.  Neurological: Positive for headaches.  Hematological: Negative.   Psychiatric/Behavioral: Negative.   All other systems reviewed and are negative.      Objective:   Physical Exam  Constitutional: He is oriented to person, place, and time. He appears well-developed and well-nourished. No distress.  HENT:  Head: Normocephalic.  Right Ear: External ear normal. A middle ear effusion is present.  Left Ear: External ear normal.  Mouth/Throat: Oropharynx is clear and moist.    Eyes: Pupils are equal, round, and reactive to light. Right eye exhibits no discharge. Left eye exhibits no discharge.  Neck: Normal range of motion. Neck supple. No thyromegaly present.  Cardiovascular: Normal rate, regular rhythm, normal heart sounds and intact distal pulses.   No murmur heard. Pulmonary/Chest: Effort normal and breath sounds normal. No respiratory distress. He has no wheezes.  Abdominal: Soft. Bowel sounds are normal. He exhibits no distension. There is no tenderness.  Musculoskeletal: Normal range of motion. He exhibits no edema or tenderness.  Neurological: He is alert and oriented to person, place, and time. He has normal reflexes. No cranial nerve deficit.  Skin: Skin is warm and dry. No rash noted. No erythema.  Psychiatric: He has a normal mood and affect. His behavior is normal. Judgment and thought content normal.  Vitals reviewed.     BP 122/71 mmHg  Pulse 75  Temp(Src) 97.5 F (36.4 C) (Oral)  Ht 5' 7.5" (1.715 m)  Wt 243 lb (110.224 kg)  BMI 37.48 kg/m2     Assessment & Plan:  1. Dysuria - POCT urinalysis dipstick - POCT UA - Microscopic Only  2. Tooth abscess - ciprofloxacin (CIPRO) 500 MG tablet; Take 1 tablet (500 mg total) by mouth 2 (two) times daily.  Dispense: 28 tablet; Refill: 0  3. Acute prostatitis - ciprofloxacin (CIPRO) 500 MG tablet; Take 1 tablet (500 mg total) by mouth 2 (two) times daily.  Dispense: 28 tablet; Refill: 0  Pt started on cipro today  If jaw pain does not improve pt needs to follow up with dentists  Pt to RTO in 10-14 weeks to recheck urine  [name], FNP

## 2014-07-17 DIAGNOSIS — M5441 Lumbago with sciatica, right side: Secondary | ICD-10-CM | POA: Diagnosis not present

## 2014-07-17 DIAGNOSIS — M5136 Other intervertebral disc degeneration, lumbar region: Secondary | ICD-10-CM | POA: Diagnosis not present

## 2014-07-24 ENCOUNTER — Ambulatory Visit: Payer: Medicare Other | Admitting: Family

## 2014-07-28 ENCOUNTER — Ambulatory Visit: Payer: Medicare Other | Admitting: Family

## 2014-08-07 ENCOUNTER — Other Ambulatory Visit: Payer: Self-pay | Admitting: Family Medicine

## 2014-09-07 ENCOUNTER — Other Ambulatory Visit: Payer: Self-pay | Admitting: Family Medicine

## 2014-09-07 NOTE — Telephone Encounter (Signed)
Last seen 07/10/14 Marcus Carr  Last lipid 11/15

## 2014-09-21 ENCOUNTER — Telehealth: Payer: Self-pay | Admitting: Family

## 2014-09-23 NOTE — Telephone Encounter (Signed)
Stp's wife and she states the oral surgeon told them to stop the ASA 5 days prior to surgery. Advised pt since he will be doing the surgery to follow the oral surgeons directions. Pt's wife voiced understanding, will close encounter.

## 2014-09-23 NOTE — Telephone Encounter (Signed)
Yes hold ASA for 3 days prior to surgery

## 2014-10-09 ENCOUNTER — Encounter: Payer: Self-pay | Admitting: Family Medicine

## 2014-10-09 ENCOUNTER — Ambulatory Visit (INDEPENDENT_AMBULATORY_CARE_PROVIDER_SITE_OTHER): Payer: Medicare Other | Admitting: Family Medicine

## 2014-10-09 VITALS — BP 134/81 | HR 69 | Temp 97.1°F | Ht 67.5 in | Wt 244.0 lb

## 2014-10-09 DIAGNOSIS — R6884 Jaw pain: Secondary | ICD-10-CM

## 2014-10-09 MED ORDER — CLINDAMYCIN HCL 300 MG PO CAPS: 300.0000 mg | ORAL_CAPSULE | Freq: Three times a day (TID) | ORAL | Status: DC

## 2014-10-09 NOTE — Progress Notes (Signed)
Subjective:  Patient ID: Marcus Carr, male    DOB: 1940/12/12  Age: 74 y.o. MRN: [id number]  CC: gum infection   HPI Marcus Carr presents for pain in R lower jaw. Fragment of jaw fell out last week. Got better, but now painful again. Knot at jawline. Dx with Torus Mandible. Has oral surgeon, Dr. Russella Dar, in [place].   History Marcus Carr has a past medical history of Gout; Hyperlipidemia; Hypertension; Back pain; and Anxiety.   Marcus Carr has past surgical history that includes Appendectomy and Tonsillectomy.   His family history includes Cancer in his mother; Heart attack in his father.Marcus Carr reports that Marcus Carr has been smoking Cigarettes.  Marcus Carr has a 75 pack-year smoking history. Marcus Carr has never used smokeless tobacco. Marcus Carr reports that Marcus Carr does not drink alcohol or use illicit drugs.  Outpatient Prescriptions Prior to Visit  Medication Sig Dispense Refill  . allopurinol (ZYLOPRIM) 300 MG tablet Take 1 tablet (300 mg total) by mouth daily. 90 tablet 3  . cholecalciferol (VITAMIN D) 400 UNITS TABS tablet Take 800 Units by mouth daily.    . citalopram (CELEXA) 20 MG tablet TAKE 1 TABLET DAILY 90 tablet 3  . fish oil-omega-3 fatty acids 1000 MG capsule Take 1 g by mouth daily.    . hydrochlorothiazide (HYDRODIURIL) 12.5 MG tablet Take 1 tablet (12.5 mg total) by mouth daily. 90 tablet 3  . niacin (NIASPAN) 1000 MG CR tablet TAKE 1 TABLET (1,000 MG TOTAL) BY MOUTH AT BEDTIME. 90 tablet 0  . oxyCODONE-acetaminophen (PERCOCET) 10-325 MG per tablet Take 1 tablet by mouth every 12 (twelve) hours as needed.     . simvastatin (ZOCOR) 40 MG tablet Take 1 tablet (40 mg total) by mouth at bedtime. 90 tablet 3  . verapamil (VERELAN PM) 240 MG 24 hr capsule One po bid 60 capsule 5  . ciprofloxacin (CIPRO) 500 MG tablet Take 1 tablet (500 mg total) by mouth 2 (two) times daily. 28 tablet 0   No facility-administered medications prior to visit.    ROS Review of Systems  Constitutional: Negative for fever, chills  and diaphoresis.  HENT: Negative for congestion, rhinorrhea and sore throat.   Respiratory: Negative for cough, shortness of breath and wheezing.   Cardiovascular: Negative for chest pain.  Gastrointestinal: Negative for nausea, vomiting, abdominal pain, diarrhea, constipation and abdominal distention.  Genitourinary: Negative for dysuria and frequency.  Musculoskeletal: Negative for joint swelling and arthralgias.  Skin: Negative for rash.  Neurological: Negative for headaches.    Objective:  BP 134/81 mmHg  Pulse 69  Temp(Src) 97.1 F (36.2 C) (Oral)  Ht 5' 7.5" (1.715 m)  Wt 244 lb (110.678 kg)  BMI 37.63 kg/m2  BP Readings from Last 3 Encounters:  10/09/14 134/81  07/10/14 122/71  06/23/14 126/77    Wt Readings from Last 3 Encounters:  10/09/14 244 lb (110.678 kg)  07/10/14 243 lb (110.224 kg)  06/23/14 248 lb 3.2 oz (112.583 kg)     Physical Exam  Constitutional: Marcus Carr appears well-developed and well-nourished.  HENT:  Head: Normocephalic and atraumatic.  Right Ear: External ear normal.  Left Ear: External ear normal.  Mouth/Throat: Abnormal dentition. Dental caries present. No oropharyngeal exudate or posterior oropharyngeal erythema.  Eyes: Pupils are equal, round, and reactive to light.  Neck: Normal range of motion. Neck supple.  Cardiovascular: Normal rate and regular rhythm.   No murmur heard. Pulmonary/Chest: Breath sounds normal. No respiratory distress.  Abdominal: Bowel sounds are normal.  Vitals reviewed.  No results found for: HGBA1C  Lab Results  Component Value Date   WBC 4.8 02/06/2014   HGB 13.8 02/06/2014   HCT 40.9 02/06/2014   PLT 140* 02/06/2014   GLUCOSE 123* 02/06/2014   CHOL 115 02/06/2014   TRIG 105 02/06/2014   HDL 39* 02/06/2014   LDLCALC 55 02/06/2014   ALT 18 02/06/2014   AST 24 02/06/2014   NA 138 02/06/2014   K 4.1 02/06/2014   CL 97 02/06/2014   CREATININE 1.26 02/06/2014   BUN 21 02/06/2014   CO2 24 02/06/2014    TSH 2.260 02/06/2014   PSA 0.7 02/06/2014    No results found.  Assessment & Plan:   Marcus Carr was seen today for gum infection.  Diagnoses and all orders for this visit:  Pain in bone of jaw Orders: -     Ambulatory referral to Oral Maxillofacial Surgery  Other orders -     clindamycin (CLEOCIN) 300 MG capsule; Take 1 capsule (300 mg total) by mouth 3 (three) times daily.   Marcus have discontinued Marcus Carr's ciprofloxacin. Marcus Carr on clindamycin. Additionally, Marcus am having him maintain his oxyCODONE-acetaminophen, fish oil-omega-3 fatty acids, cholecalciferol, allopurinol, citalopram, simvastatin, verapamil, hydrochlorothiazide, niacin, and chlorhexidine.  Meds ordered this encounter  Medications  . chlorhexidine (PERIDEX) 0.12 % solution    Sig: USE 1/2 CAPFUL SWISH FOR 30 SECONDS THEN SPIT TWICE DAILY BEGIN 2 DAYS BEFORE SURGERY    Refill:  0  . clindamycin (CLEOCIN) 300 MG capsule    Sig: Take 1 capsule (300 mg total) by mouth 3 (three) times daily.    Dispense:  30 capsule    Refill:  0   Patient was encouraged to follow-up as soon as possible with his oral surgeon.  Follow-up: Return if symptoms worsen or fail to improve.  Claretta Fraise, M.D.

## 2014-10-25 ENCOUNTER — Encounter (HOSPITAL_BASED_OUTPATIENT_CLINIC_OR_DEPARTMENT_OTHER): Payer: Medicare Other | Attending: Surgery

## 2014-10-25 DIAGNOSIS — S02600A Fracture of unspecified part of body of mandible, initial encounter for closed fracture: Secondary | ICD-10-CM | POA: Insufficient documentation

## 2014-10-25 DIAGNOSIS — M879 Osteonecrosis, unspecified: Secondary | ICD-10-CM | POA: Diagnosis not present

## 2014-10-25 DIAGNOSIS — X58XXXA Exposure to other specified factors, initial encounter: Secondary | ICD-10-CM | POA: Diagnosis not present

## 2014-10-25 DIAGNOSIS — F17218 Nicotine dependence, cigarettes, with other nicotine-induced disorders: Secondary | ICD-10-CM | POA: Insufficient documentation

## 2014-10-25 DIAGNOSIS — M109 Gout, unspecified: Secondary | ICD-10-CM | POA: Insufficient documentation

## 2014-10-25 DIAGNOSIS — I10 Essential (primary) hypertension: Secondary | ICD-10-CM | POA: Insufficient documentation

## 2014-10-25 DIAGNOSIS — M8788 Other osteonecrosis, other site: Secondary | ICD-10-CM | POA: Diagnosis not present

## 2014-10-26 ENCOUNTER — Ambulatory Visit (INDEPENDENT_AMBULATORY_CARE_PROVIDER_SITE_OTHER): Payer: Medicare Other | Admitting: Family

## 2014-10-26 ENCOUNTER — Ambulatory Visit (INDEPENDENT_AMBULATORY_CARE_PROVIDER_SITE_OTHER): Payer: Medicare Other

## 2014-10-26 ENCOUNTER — Encounter: Payer: Self-pay | Admitting: Family

## 2014-10-26 VITALS — BP 132/80 | HR 69 | Temp 97.3°F | Ht 67.5 in | Wt 234.8 lb

## 2014-10-26 DIAGNOSIS — Z01818 Encounter for other preprocedural examination: Secondary | ICD-10-CM | POA: Diagnosis not present

## 2014-10-26 DIAGNOSIS — Z720 Tobacco use: Secondary | ICD-10-CM | POA: Diagnosis not present

## 2014-10-26 DIAGNOSIS — Z716 Tobacco abuse counseling: Secondary | ICD-10-CM

## 2014-10-26 MED ORDER — VARENICLINE TARTRATE 1 MG PO TABS
1.0000 mg | ORAL_TABLET | Freq: Two times a day (BID) | ORAL | Status: DC
Start: 2014-10-26 — End: 2015-04-03

## 2014-10-26 MED ORDER — VARENICLINE TARTRATE 0.5 MG X 11 & 1 MG X 42 PO MISC: ORAL | Status: DC

## 2014-10-26 NOTE — Patient Instructions (Signed)
Smoking Cessation Quitting smoking is important to your health and has many advantages. However, it is not always easy to quit since nicotine is a very addictive drug. Oftentimes, people try 3 times or more before being able to quit. This document explains the best ways for you to prepare to quit smoking. Quitting takes hard work and a lot of effort, but you can do it. ADVANTAGES OF QUITTING SMOKING  You will live longer, feel better, and live better.  Your body will feel the impact of quitting smoking almost immediately.  Within 20 minutes, blood pressure decreases. Your pulse returns to its normal level.  After 8 hours, carbon monoxide levels in the blood return to normal. Your oxygen level increases.  After 24 hours, the chance of having a heart attack starts to decrease. Your breath, hair, and body stop smelling like smoke.  After 48 hours, damaged nerve endings begin to recover. Your sense of taste and smell improve.  After 72 hours, the body is virtually free of nicotine. Your bronchial tubes relax and breathing becomes easier.  After 2 to 12 weeks, lungs can hold more air. Exercise becomes easier and circulation improves.  The risk of having a heart attack, stroke, cancer, or lung disease is greatly reduced.  After 1 year, the risk of coronary heart disease is cut in half.  After 5 years, the risk of stroke falls to the same as a nonsmoker.  After 10 years, the risk of lung cancer is cut in half and the risk of other cancers decreases significantly.  After 15 years, the risk of coronary heart disease drops, usually to the level of a nonsmoker.  If you are pregnant, quitting smoking will improve your chances of having a healthy baby.  The people you live with, especially any children, will be healthier.  You will have extra money to spend on things other than cigarettes. QUESTIONS TO THINK ABOUT BEFORE ATTEMPTING TO QUIT You may want to talk about your answers with your  health care provider.  Why do you want to quit?  If you tried to quit in the past, what helped and what did not?  What will be the most difficult situations for you after you quit? How will you plan to handle them?  Who can help you through the tough times? Your family? Friends? A health care provider?  What pleasures do you get from smoking? What ways can you still get pleasure if you quit? Here are some questions to ask your health care provider:  How can you help me to be successful at quitting?  What medicine do you think would be best for me and how should I take it?  What should I do if I need more help?  What is smoking withdrawal like? How can I get information on withdrawal? GET READY  Set a quit date.  Change your environment by getting rid of all cigarettes, ashtrays, matches, and lighters in your home, car, or work. Do not let people smoke in your home.  Review your past attempts to quit. Think about what worked and what did not. GET SUPPORT AND ENCOURAGEMENT You have a better chance of being successful if you have help. You can get support in many ways.  Tell your family, friends, and coworkers that you are going to quit and need their support. Ask them not to smoke around you.  Get individual, group, or telephone counseling and support. Programs are available at local hospitals and health centers. Call   your local health department for information about programs in your area.  Spiritual beliefs and practices may help some smokers quit.  Download a "quit meter" on your computer to keep track of quit statistics, such as how long you have gone without smoking, cigarettes not smoked, and money saved.  Get a self-help book about quitting smoking and staying off tobacco. LEARN NEW SKILLS AND BEHAVIORS  Distract yourself from urges to smoke. Talk to someone, go for a walk, or occupy your time with a task.  Change your normal routine. Take a different route to work.  Drink tea instead of coffee. Eat breakfast in a different place.  Reduce your stress. Take a hot bath, exercise, or read a book.  Plan something enjoyable to do every day. Reward yourself for not smoking.  Explore interactive web-based programs that specialize in helping you quit. GET MEDICINE AND USE IT CORRECTLY Medicines can help you stop smoking and decrease the urge to smoke. Combining medicine with the above behavioral methods and support can greatly increase your chances of successfully quitting smoking.  Nicotine replacement therapy helps deliver nicotine to your body without the negative effects and risks of smoking. Nicotine replacement therapy includes nicotine gum, lozenges, inhalers, nasal sprays, and skin patches. Some may be available over-the-counter and others require a prescription.  Antidepressant medicine helps people abstain from smoking, but how this works is unknown. This medicine is available by prescription.  Nicotinic receptor partial agonist medicine simulates the effect of nicotine in your brain. This medicine is available by prescription. Ask your health care provider for advice about which medicines to use and how to use them based on your health history. Your health care provider will tell you what side effects to look out for if you choose to be on a medicine or therapy. Carefully read the information on the package. Do not use any other product containing nicotine while using a nicotine replacement product.  RELAPSE OR DIFFICULT SITUATIONS Most relapses occur within the first 3 months after quitting. Do not be discouraged if you start smoking again. Remember, most people try several times before finally quitting. You may have symptoms of withdrawal because your body is used to nicotine. You may crave cigarettes, be irritable, feel very hungry, cough often, get headaches, or have difficulty concentrating. The withdrawal symptoms are only temporary. They are strongest  when you first quit, but they will go away within 10-14 days. To reduce the chances of relapse, try to:  Avoid drinking alcohol. Drinking lowers your chances of successfully quitting.  Reduce the amount of caffeine you consume. Once you quit smoking, the amount of caffeine in your body increases and can give you symptoms, such as a rapid heartbeat, sweating, and anxiety.  Avoid smokers because they can make you want to smoke.  Do not let weight gain distract you. Many smokers will gain weight when they quit, usually less than 10 pounds. Eat a healthy diet and stay active. You can always lose the weight gained after you quit.  Find ways to improve your mood other than smoking. FOR MORE INFORMATION  www.smokefree.gov  Document Released: 03/04/2001 Document Revised: 07/25/2013 Document Reviewed: 06/19/2011 [employer] Patient Information 2015 [employer]. This information is not intended to replace advice given to you by your health care provider. Make sure you discuss any questions you have with your health care provider.  

## 2014-10-26 NOTE — Progress Notes (Signed)
Subjective:    Patient ID: Marcus Carr, male    DOB: [birth year], 74 y.o.   MRN: [id number]  HPI Pt presents to office to have a chest x-ray and EKG for Wound Care for use of Hyperbaric Oxygen Thearpy. Pt states he has in "infection in his jaw". According to patients record he has a history of Torus Mandible. Pt also states he needs to discuss smoking cessation.    Review of Systems  Constitutional: Negative.   HENT: Negative.   Respiratory: Negative.   Cardiovascular: Negative.   Gastrointestinal: Negative.   Endocrine: Negative.   Genitourinary: Negative.   Musculoskeletal: Negative.   Neurological: Negative.   Hematological: Negative.   Psychiatric/Behavioral: Negative.   All other systems reviewed and are negative.      Objective:   Physical Exam  Constitutional: He is oriented to person, place, and time. He appears well-developed and well-nourished. No distress.  HENT:  Head: Normocephalic.  Neck: Normal range of motion. Neck supple. No thyromegaly present.  Cardiovascular: Normal rate, regular rhythm, normal heart sounds and intact distal pulses.   No murmur heard. Pulmonary/Chest: Effort normal and breath sounds normal. No respiratory distress. He has no wheezes.  Abdominal: Soft. Bowel sounds are normal. He exhibits no distension. There is no tenderness.  Musculoskeletal: Normal range of motion. He exhibits no edema or tenderness.  Neurological: He is alert and oriented to person, place, and time. He has normal reflexes. No cranial nerve deficit.  Skin: Skin is warm and dry. No rash noted. No erythema.  Psychiatric: He has a normal mood and affect. His behavior is normal. Judgment and thought content normal.  Vitals reviewed.   BP 132/80 mmHg  Pulse 69  Temp(Src) 97.3 F (36.3 C) (Oral)  Ht 5' 7.5" (1.715 m)  Wt 234 lb 12.8 oz (106.505 kg)  BMI 36.21 kg/m2  Chest X-ray- WNL  EKG-Sinus Rhythm     Assessment & Plan:  1. Pre-operative clearance -  EKG 12-Lead - DG Chest 2 View; Future - CMP14+EGFR  2. Encounter for smoking cessation counseling -Smoking cessation discussed - varenicline (CHANTIX STARTING MONTH PAK) 0.5 MG X 11 & 1 MG X 42 tablet; Take one 0.5 mg tablet by mouth once daily for 3 days, then increase to one 0.5 mg tablet twice daily for 4 days, then increase to one 1 mg tablet twice daily.  Dispense: 53 tablet; Refill: 0 - varenicline (CHANTIX CONTINUING MONTH PAK) 1 MG tablet; Take 1 tablet (1 mg total) by mouth 2 (two) times daily.  Dispense: 60 tablet; Refill: 2 - CMP14+EGFR   Continue all meds Labs pending Health Maintenance reviewed Diet and exercise encouraged RTO  As needed  [name], FNP    [name], FNP

## 2014-10-27 LAB — CMP14+EGFR
ALT: 20 IU/L (ref 0–44)
AST: 26 IU/L (ref 0–40)
Albumin/Globulin Ratio: 1.3 (ref 1.1–2.5)
Albumin: 3.8 g/dL (ref 3.5–4.8)
Alkaline Phosphatase: 96 IU/L (ref 39–117)
BILIRUBIN TOTAL: 0.5 mg/dL (ref 0.0–1.2)
BUN/Creatinine Ratio: 18 (ref 10–22)
BUN: 19 mg/dL (ref 8–27)
CALCIUM: 9.1 mg/dL (ref 8.6–10.2)
CO2: 25 mmol/L (ref 18–29)
CREATININE: 1.03 mg/dL (ref 0.76–1.27)
Chloride: 93 mmol/L — ABNORMAL LOW (ref 97–108)
GFR calc Af Amer: 84 mL/min/{1.73_m2} (ref >59)
GFR, EST NON AFRICAN AMERICAN: 72 mL/min/{1.73_m2} (ref >59)
GLUCOSE: 97 mg/dL (ref 65–99)
Globulin, Total: 2.9 g/dL (ref 1.5–4.5)
Potassium: 4.1 mmol/L (ref 3.5–5.2)
Sodium: 137 mmol/L (ref 134–144)
Total Protein: 6.7 g/dL (ref 6.0–8.5)

## 2014-10-29 DIAGNOSIS — K122 Cellulitis and abscess of mouth: Secondary | ICD-10-CM | POA: Diagnosis not present

## 2014-10-29 DIAGNOSIS — F17200 Nicotine dependence, unspecified, uncomplicated: Secondary | ICD-10-CM | POA: Diagnosis not present

## 2014-10-29 DIAGNOSIS — I10 Essential (primary) hypertension: Secondary | ICD-10-CM | POA: Diagnosis not present

## 2014-10-29 DIAGNOSIS — L0201 Cutaneous abscess of face: Secondary | ICD-10-CM | POA: Diagnosis not present

## 2014-10-29 DIAGNOSIS — Z79899 Other long term (current) drug therapy: Secondary | ICD-10-CM | POA: Diagnosis not present

## 2014-10-29 DIAGNOSIS — Z7982 Long term (current) use of aspirin: Secondary | ICD-10-CM | POA: Diagnosis not present

## 2014-10-30 ENCOUNTER — Ambulatory Visit (INDEPENDENT_AMBULATORY_CARE_PROVIDER_SITE_OTHER): Payer: Medicare Other | Admitting: Family

## 2014-10-30 ENCOUNTER — Encounter: Payer: Self-pay | Admitting: Family

## 2014-10-30 VITALS — BP 123/76 | HR 69 | Temp 98.4°F | Ht 67.5 in | Wt 234.0 lb

## 2014-10-30 DIAGNOSIS — R6884 Jaw pain: Secondary | ICD-10-CM

## 2014-10-30 DIAGNOSIS — L0291 Cutaneous abscess, unspecified: Secondary | ICD-10-CM

## 2014-10-30 MED ORDER — SULFAMETHOXAZOLE-TRIMETHOPRIM 800-160 MG PO TABS: 1.0000 | ORAL_TABLET | Freq: Two times a day (BID) | ORAL | Status: DC

## 2014-10-30 MED ORDER — OXYCODONE-ACETAMINOPHEN 10-325 MG PO TABS: 1.0000 | ORAL_TABLET | Freq: Two times a day (BID) | ORAL | Status: AC | PRN

## 2014-10-30 NOTE — Patient Instructions (Signed)

## 2014-10-30 NOTE — Progress Notes (Signed)
   Subjective:    Patient ID: Marcus Carr, male    DOB: March 22, 1941, 74 y.o.   MRN: [id number]  HPI Pt presents to the office today for hospital follow up and jaw pain. Pt states he has had jaw pain for about two months. Pt has been to an Chief Financial Officer and a wound center with no answer. PT states the wound center is suppose to start hyperbaric chamber. Pt states he is is constant  Pain of 10 out 10. Pt states his chin started draining yellow pus Saturday morning.   Review of Systems  Constitutional: Negative.   HENT: Negative.   Respiratory: Negative.   Cardiovascular: Negative.   Gastrointestinal: Negative.   Endocrine: Negative.   Genitourinary: Negative.   Musculoskeletal: Negative.   Neurological: Negative.   Hematological: Negative.   Psychiatric/Behavioral: Negative.   All other systems reviewed and are negative.      Objective:   Physical Exam  Constitutional: He is oriented to person, place, and time. He appears well-developed and well-nourished. No distress.  HENT:  Head: Normocephalic.  Right Ear: External ear normal.  Left Ear: External ear normal.  Mouth/Throat: Oropharynx is clear and moist.  Eyes: Pupils are equal, round, and reactive to light. Right eye exhibits no discharge. Left eye exhibits no discharge.  Neck: Normal range of motion. Neck supple. No thyromegaly present.  Cardiovascular: Normal rate, regular rhythm, normal heart sounds and intact distal pulses.   No murmur heard. Pulmonary/Chest: Effort normal and breath sounds normal. No respiratory distress. He has no wheezes.  Abdominal: Soft. Bowel sounds are normal. He exhibits no distension. There is no tenderness.  Musculoskeletal: Normal range of motion. He exhibits no edema or tenderness.  Neurological: He is alert and oriented to person, place, and time. He has normal reflexes. No cranial nerve deficit.  Skin: Skin is warm and dry. No rash noted. No erythema.  Psychiatric: He has a normal mood  and affect. His behavior is normal. Judgment and thought content normal.  Vitals reviewed.     BP 123/76 mmHg  Pulse 69  Temp(Src) 98.4 F (36.9 C) (Oral)  Ht 5' 7.5" (1.715 m)  Wt 234 lb (106.142 kg)  BMI 36.09 kg/m2     Assessment & Plan:  1. Jaw pain - oxyCODONE-acetaminophen (PERCOCET) 10-325 MG per tablet; Take 1 tablet by mouth every 12 (twelve) hours as needed.  Dispense: 90 tablet; Refill: 0  2. Abscess -Warm compresses -Pt to take antibiotic and continue to let chin drain -If pain continue pt will need to follow up with oral surgeon -RTO in 1 week  - sulfamethoxazole-trimethoprim (BACTRIM DS,SEPTRA DS) 800-160 MG per tablet; Take 1 tablet by mouth 2 (two) times daily.  Dispense: 20 tablet; Refill: 0 - oxyCODONE-acetaminophen (PERCOCET) 10-325 MG per tablet; Take 1 tablet by mouth every 12 (twelve) hours as needed.  Dispense: 90 tablet; Refill: 0  * I refilled pt's percocet today because pt is having to take it every 6 hours for pain. Told pt this is the only rx I could write him.  [name], FNP

## 2014-10-31 ENCOUNTER — Telehealth: Payer: Self-pay | Admitting: Family

## 2014-11-01 NOTE — Telephone Encounter (Signed)
Patient advised that I called the hospital and received records. The hospitals medical record department states that they do not see that a culture was done.

## 2014-11-06 ENCOUNTER — Inpatient Hospital Stay (HOSPITAL_COMMUNITY)
Admission: EM | Admit: 2014-11-06 | Discharge: 2014-11-10 | DRG: 158 | Disposition: A | Discharge status: Home / Self Care | Payer: Medicare Other | Attending: Internal Medicine | Admitting: Internal Medicine

## 2014-11-06 ENCOUNTER — Emergency Department (HOSPITAL_COMMUNITY): Payer: Medicare Other

## 2014-11-06 ENCOUNTER — Ambulatory Visit: Payer: Medicare Other | Admitting: Family

## 2014-11-06 ENCOUNTER — Encounter (HOSPITAL_COMMUNITY): Payer: Self-pay | Admitting: Emergency Medicine

## 2014-11-06 DIAGNOSIS — E861 Hypovolemia: Secondary | ICD-10-CM | POA: Diagnosis present

## 2014-11-06 DIAGNOSIS — Z6835 Body mass index (BMI) 35.0-35.9, adult: Secondary | ICD-10-CM | POA: Diagnosis not present

## 2014-11-06 DIAGNOSIS — F419 Anxiety disorder, unspecified: Secondary | ICD-10-CM | POA: Diagnosis present

## 2014-11-06 DIAGNOSIS — K089 Disorder of teeth and supporting structures, unspecified: Secondary | ICD-10-CM | POA: Diagnosis present

## 2014-11-06 DIAGNOSIS — H7013 Chronic mastoiditis, bilateral: Secondary | ICD-10-CM | POA: Diagnosis present

## 2014-11-06 DIAGNOSIS — N189 Chronic kidney disease, unspecified: Secondary | ICD-10-CM | POA: Diagnosis present

## 2014-11-06 DIAGNOSIS — M109 Gout, unspecified: Secondary | ICD-10-CM | POA: Diagnosis not present

## 2014-11-06 DIAGNOSIS — E785 Hyperlipidemia, unspecified: Secondary | ICD-10-CM | POA: Diagnosis present

## 2014-11-06 DIAGNOSIS — N183 Chronic kidney disease, stage 3 (moderate): Secondary | ICD-10-CM | POA: Diagnosis present

## 2014-11-06 DIAGNOSIS — F329 Major depressive disorder, single episode, unspecified: Secondary | ICD-10-CM | POA: Diagnosis present

## 2014-11-06 DIAGNOSIS — Z7982 Long term (current) use of aspirin: Secondary | ICD-10-CM

## 2014-11-06 DIAGNOSIS — E041 Nontoxic single thyroid nodule: Secondary | ICD-10-CM | POA: Diagnosis not present

## 2014-11-06 DIAGNOSIS — I10 Essential (primary) hypertension: Secondary | ICD-10-CM | POA: Diagnosis present

## 2014-11-06 DIAGNOSIS — Z79899 Other long term (current) drug therapy: Secondary | ICD-10-CM

## 2014-11-06 DIAGNOSIS — I129 Hypertensive chronic kidney disease with stage 1 through stage 4 chronic kidney disease, or unspecified chronic kidney disease: Secondary | ICD-10-CM | POA: Diagnosis present

## 2014-11-06 DIAGNOSIS — Z880 Allergy status to penicillin: Secondary | ICD-10-CM | POA: Diagnosis not present

## 2014-11-06 DIAGNOSIS — Z79891 Long term (current) use of opiate analgesic: Secondary | ICD-10-CM

## 2014-11-06 DIAGNOSIS — M549 Dorsalgia, unspecified: Secondary | ICD-10-CM | POA: Diagnosis present

## 2014-11-06 DIAGNOSIS — Z87891 Personal history of nicotine dependence: Secondary | ICD-10-CM | POA: Diagnosis present

## 2014-11-06 DIAGNOSIS — R6884 Jaw pain: Secondary | ICD-10-CM | POA: Diagnosis not present

## 2014-11-06 DIAGNOSIS — F17210 Nicotine dependence, cigarettes, uncomplicated: Secondary | ICD-10-CM | POA: Diagnosis not present

## 2014-11-06 DIAGNOSIS — M272 Inflammatory conditions of jaws: Principal | ICD-10-CM | POA: Diagnosis present

## 2014-11-06 DIAGNOSIS — K122 Cellulitis and abscess of mouth: Secondary | ICD-10-CM | POA: Diagnosis present

## 2014-11-06 DIAGNOSIS — L0201 Cutaneous abscess of face: Secondary | ICD-10-CM | POA: Diagnosis not present

## 2014-11-06 DIAGNOSIS — M869 Osteomyelitis, unspecified: Secondary | ICD-10-CM

## 2014-11-06 HISTORY — DX: Chronic kidney disease, unspecified: N18.9

## 2014-11-06 LAB — CBC WITH DIFFERENTIAL/PLATELET
BASOS ABS: 0 10*3/uL (ref 0.0–0.1)
BASOS PCT: 0 % (ref 0–1)
Eosinophils Absolute: 0.1 10*3/uL (ref 0.0–0.7)
Eosinophils Relative: 2 % (ref 0–5)
HCT: 37.5 % — ABNORMAL LOW (ref 39.0–52.0)
HEMOGLOBIN: 12.5 g/dL — AB (ref 13.0–17.0)
Lymphocytes Relative: 18 % (ref 12–46)
Lymphs Abs: 1.4 10*3/uL (ref 0.7–4.0)
MCH: 29.4 pg (ref 26.0–34.0)
MCHC: 33.3 g/dL (ref 30.0–36.0)
MCV: 88.2 fL (ref 78.0–100.0)
Monocytes Absolute: 0.6 10*3/uL (ref 0.1–1.0)
Monocytes Relative: 8 % (ref 3–12)
NEUTROS PCT: 72 % (ref 43–77)
Neutro Abs: 5.5 10*3/uL (ref 1.7–7.7)
Platelets: 261 10*3/uL (ref 150–400)
RBC: 4.25 MIL/uL (ref 4.22–5.81)
RDW: 13.8 % (ref 11.5–15.5)
WBC: 7.6 10*3/uL (ref 4.0–10.5)

## 2014-11-06 LAB — COMPREHENSIVE METABOLIC PANEL
ALBUMIN: 3.2 g/dL — AB (ref 3.5–5.0)
ALK PHOS: 73 U/L (ref 38–126)
ALT: 26 U/L (ref 17–63)
ANION GAP: 11 (ref 5–15)
AST: 29 U/L (ref 15–41)
BUN: 16 mg/dL (ref 6–20)
CO2: 28 mmol/L (ref 22–32)
CREATININE: 1.25 mg/dL — AB (ref 0.61–1.24)
Calcium: 9.6 mg/dL (ref 8.9–10.3)
Chloride: 96 mmol/L — ABNORMAL LOW (ref 101–111)
GFR calc Af Amer: >60 mL/min (ref >60)
GFR calc non Af Amer: 56 mL/min — ABNORMAL LOW (ref >60)
GLUCOSE: 102 mg/dL — AB (ref 65–99)
Potassium: 4.3 mmol/L (ref 3.5–5.1)
SODIUM: 135 mmol/L (ref 135–145)
Total Bilirubin: 0.3 mg/dL (ref 0.3–1.2)
Total Protein: 7.1 g/dL (ref 6.5–8.1)

## 2014-11-06 LAB — LACTIC ACID, PLASMA: LACTIC ACID, VENOUS: 0.9 mmol/L (ref 0.5–2.0)

## 2014-11-06 MED ORDER — VANCOMYCIN HCL IN DEXTROSE 1-5 GM/200ML-% IV SOLN
1000.0000 mg | Freq: Once | INTRAVENOUS | Status: DC
Start: 2014-11-06 — End: 2014-11-06

## 2014-11-06 MED ORDER — DEXTROSE 5 % IV SOLN
2.0000 g | Freq: Three times a day (TID) | INTRAVENOUS | Status: DC
  Administered 2014-11-06 – 2014-11-10 (×11): 2 g via INTRAVENOUS
  Filled 2014-11-06 (×15): qty 2

## 2014-11-06 MED ORDER — MORPHINE SULFATE (PF) 4 MG/ML IV SOLN
6.0000 mg | Freq: Once | INTRAVENOUS | Status: AC
  Administered 2014-11-06: 6 mg via INTRAVENOUS
  Filled 2014-11-06: qty 2

## 2014-11-06 MED ORDER — VANCOMYCIN HCL 10 G IV SOLR
2000.0000 mg | Freq: Once | INTRAVENOUS | Status: AC
  Administered 2014-11-06: 2000 mg via INTRAVENOUS
  Filled 2014-11-06: qty 2000

## 2014-11-06 MED ORDER — SODIUM CHLORIDE 0.9 % IV BOLUS (SEPSIS)
500.0000 mL | Freq: Once | INTRAVENOUS | Status: AC
  Administered 2014-11-06: 500 mL via INTRAVENOUS

## 2014-11-06 NOTE — ED Notes (Signed)
Pt. reports abscess at lower chin with drainage for several weeks , swelling and drainage worsened this week , denies fever or chills / currently taking oral Clindamycin with no improvement  .

## 2014-11-06 NOTE — ED Provider Notes (Signed)
CSN: [id number]     Arrival date & time 11/06/14  1906 History   First MD Initiated Contact with Patient 11/06/14 2114     Chief Complaint  Patient presents with  . Abscess     (Consider location/radiation/quality/duration/timing/severity/associated sxs/prior Treatment) HPI Comments: Patient followed by [facility] with a history of HTN and chronic back pain presents with draining abscess of chin. He was diagnosed with osteomyelitis of the mandible, according to his wife, by an oral surgeon and has been on Clindamycin for the past 3 weeks. No fever. He has significant pain associated with his last remaining 3 teeth which are the lower incisors. He has been unable to eat anything but a soft diet for 3 weeks. No fever, vomiting. Two weeks ago he had external drainage of purulent material on lower chin, and 2 days ago a second opening began draining. He presents for evaluation of worsening infection.  Patient is a 74 y.o. male presenting with abscess. The history is provided by the patient. No language interpreter was used.  Abscess Location:  Face Facial abscess location:  Chin Abscess quality: draining and painful   Red streaking: no   Duration:  3 weeks Associated symptoms: no fever, no nausea and no vomiting     Past Medical History  Diagnosis Date  . Gout   . Hyperlipidemia   . Hypertension   . Back pain     chronic back pain treated by Dr. Nelva Bush  . Anxiety    Past Surgical History  Procedure Laterality Date  . Appendectomy    . Tonsillectomy     Family History  Problem Relation Age of Onset  . Cancer Mother     esophageal  . Heart attack Father    Social History  Substance Use Topics  . Smoking status: Current Every Day Smoker -- 1.50 packs/day for 50 years    Types: Cigarettes  . Smokeless tobacco: Never Used  . Alcohol Use: No    Review of Systems  Constitutional: Negative for fever and chills.  HENT: Positive for dental problem. Negative for sore throat and  trouble swallowing.   Respiratory: Negative.  Negative for shortness of breath.   Cardiovascular: Negative.   Gastrointestinal: Negative.  Negative for nausea and vomiting.  Genitourinary: Negative.   Musculoskeletal: Positive for back pain.       Chronic back pain, currently uncontrolled with home medications.   Skin: Positive for wound.  Neurological: Negative.       Allergies  Penicillins  Home Medications   Prior to Admission medications   Medication Sig Start Date End Date Taking? Authorizing Provider  allopurinol (ZYLOPRIM) 300 MG tablet Take 1 tablet (300 mg total) by mouth daily. 02/06/14  Yes Lysbeth Penner, FNP  aspirin 325 MG tablet Take 325 mg by mouth daily.   Yes Historical Provider, MD  cholecalciferol (VITAMIN D) 400 UNITS TABS tablet Take 800 Units by mouth daily.   Yes Historical Provider, MD  citalopram (CELEXA) 20 MG tablet TAKE 1 TABLET DAILY 02/06/14  Yes Lysbeth Penner, FNP  clindamycin (CLEOCIN) 300 MG capsule Take 1 capsule (300 mg total) by mouth 3 (three) times daily. 10/09/14  Yes Claretta Fraise, MD  fish oil-omega-3 fatty acids 1000 MG capsule Take 1 g by mouth 2 (two) times daily.    Yes Historical Provider, MD  hydrochlorothiazide (HYDRODIURIL) 12.5 MG tablet Take 1 tablet (12.5 mg total) by mouth daily. 06/23/14  Yes Sharion Balloon, FNP  niacin (NIASPAN) 1000  MG CR tablet TAKE 1 TABLET (1,000 MG TOTAL) BY MOUTH AT BEDTIME. 09/08/14  Yes Sharion Balloon, FNP  oxyCODONE-acetaminophen (PERCOCET) 10-325 MG per tablet Take 1 tablet by mouth every 12 (twelve) hours as needed. 10/30/14  Yes Sharion Balloon, FNP  simvastatin (ZOCOR) 40 MG tablet Take 1 tablet (40 mg total) by mouth at bedtime. 02/06/14  Yes Lysbeth Penner, FNP  varenicline (CHANTIX CONTINUING MONTH PAK) 1 MG tablet Take 1 tablet (1 mg total) by mouth 2 (two) times daily. 10/26/14  Yes Sharion Balloon, FNP  verapamil (VERELAN PM) 240 MG 24 hr capsule One po bid 06/23/14  Yes Sharion Balloon, FNP   chlorhexidine (PERIDEX) 0.12 % solution USE 1/2 CAPFUL SWISH FOR 30 SECONDS THEN SPIT TWICE DAILY BEGIN 2 DAYS BEFORE SURGERY 09/17/14   Historical Provider, MD  clindamycin (CLEOCIN) 150 MG capsule  10/24/14   Historical Provider, MD  sulfamethoxazole-trimethoprim (BACTRIM DS,SEPTRA DS) 800-160 MG per tablet Take 1 tablet by mouth 2 (two) times daily. Patient not taking: Reported on 11/06/2014 10/30/14   Sharion Balloon, FNP   BP 148/79 mmHg  Pulse 65  Temp(Src) 97.7 F (36.5 C) (Oral)  Resp 14  Ht 5\' 8"  (1.727 m)  Wt 234 lb (106.142 kg)  BMI 35.59 kg/m2  SpO2 97% Physical Exam  Constitutional: He is oriented to person, place, and time. He appears well-developed and well-nourished.  HENT:  Head: Normocephalic.  Edentulous except for 3 lower incisors with severe decay that remain.   Eyes: Conjunctivae are normal.  Neck: Normal range of motion. Neck supple.  No anterior neck tenderness or concerning swelling.  Cardiovascular: Normal rate and regular rhythm.   Pulmonary/Chest: Effort normal and breath sounds normal.  Abdominal: Soft. Bowel sounds are normal. There is no tenderness. There is no rebound and no guarding.  Musculoskeletal: Normal range of motion.  Neurological: He is alert and oriented to person, place, and time. Coordination normal.  Skin: Skin is warm and dry. No rash noted.  Abscesses x 2 to chin, actively draining. Moderate localized swelling with induration that extends through submental area.    Psychiatric: He has a normal mood and affect.    ED Course  Procedures (including critical care time) Labs Review Labs Reviewed  CBC WITH DIFFERENTIAL/PLATELET - Abnormal; Notable for the following:    Hemoglobin 12.5 (*)    HCT 37.5 (*)    All other components within normal limits  COMPREHENSIVE METABOLIC PANEL - Abnormal; Notable for the following:    Chloride 96 (*)    Glucose, Bld 102 (*)    Creatinine, Ser 1.25 (*)    Albumin 3.2 (*)    GFR calc non Af Amer 56  (*)    All other components within normal limits  LACTIC ACID, PLASMA  LACTIC ACID, PLASMA   Results for orders placed or performed during the hospital encounter of 11/06/14  Wound culture  Result Value Ref Range   Specimen Description WOUND FACE    Special Requests NONE    Gram Stain      FEW WBC PRESENT, PREDOMINANTLY PMN NO SQUAMOUS EPITHELIAL CELLS SEEN RARE GRAM POSITIVE COCCI IN PAIRS Performed at [facility]    Culture      MULTIPLE ORGANISMS PRESENT, NONE PREDOMINANT Note: NO STAPHYLOCOCCUS AUREUS ISOLATED NO GROUP A STREP (S.PYOGENES) ISOLATED Performed at [facility]    Report Status 11/09/2014 FINAL   CBC with Differential  Result Value Ref Range   WBC 7.6  4.0 - 10.5 K/uL   RBC 4.25 4.22 - 5.81 MIL/uL   Hemoglobin 12.5 (L) 13.0 - 17.0 g/dL   HCT 37.5 (L) 39.0 - 52.0 %   MCV 88.2 78.0 - 100.0 fL   MCH 29.4 26.0 - 34.0 pg   MCHC 33.3 30.0 - 36.0 g/dL   RDW 13.8 11.5 - 15.5 %   Platelets 261 150 - 400 K/uL   Neutrophils Relative % 72 43 - 77 %   Neutro Abs 5.5 1.7 - 7.7 K/uL   Lymphocytes Relative 18 12 - 46 %   Lymphs Abs 1.4 0.7 - 4.0 K/uL   Monocytes Relative 8 3 - 12 %   Monocytes Absolute 0.6 0.1 - 1.0 K/uL   Eosinophils Relative 2 0 - 5 %   Eosinophils Absolute 0.1 0.0 - 0.7 K/uL   Basophils Relative 0 0 - 1 %   Basophils Absolute 0.0 0.0 - 0.1 K/uL  Comprehensive metabolic panel  Result Value Ref Range   Sodium 135 135 - 145 mmol/L   Potassium 4.3 3.5 - 5.1 mmol/L   Chloride 96 (L) 101 - 111 mmol/L   CO2 28 22 - 32 mmol/L   Glucose, Bld 102 (H) 65 - 99 mg/dL   BUN 16 6 - 20 mg/dL   Creatinine, Ser 1.25 (H) 0.61 - 1.24 mg/dL   Calcium 9.6 8.9 - 10.3 mg/dL   Total Protein 7.1 6.5 - 8.1 g/dL   Albumin 3.2 (L) 3.5 - 5.0 g/dL   AST 29 15 - 41 U/L   ALT 26 17 - 63 U/L   Alkaline Phosphatase 73 38 - 126 U/L   Total Bilirubin 0.3 0.3 - 1.2 mg/dL   GFR calc non Af Amer 56 (L) >60 mL/min   GFR calc Af Amer >60 >60 mL/min   Anion gap  11 5 - 15  Lactic acid, plasma  Result Value Ref Range   Lactic Acid, Venous 0.9 0.5 - 2.0 mmol/L  Lactic acid, plasma  Result Value Ref Range   Lactic Acid, Venous 0.8 0.5 - 2.0 mmol/L  TSH  Result Value Ref Range   TSH 1.447 0.350 - 4.500 uIU/mL  Urinalysis, Routine w reflex microscopic (not at [facility])  Result Value Ref Range   Color, Urine YELLOW YELLOW   APPearance CLEAR CLEAR   Specific Gravity, Urine 1.020 1.005 - 1.030   pH 6.5 5.0 - 8.0   Glucose, UA NEGATIVE NEGATIVE mg/dL   Hgb urine dipstick SMALL (A) NEGATIVE   Bilirubin Urine NEGATIVE NEGATIVE   Ketones, ur NEGATIVE NEGATIVE mg/dL   Protein, ur NEGATIVE NEGATIVE mg/dL   Urobilinogen, UA 1.0 0.0 - 1.0 mg/dL   Nitrite NEGATIVE NEGATIVE   Leukocytes, UA NEGATIVE NEGATIVE  Basic metabolic panel  Result Value Ref Range   Sodium 133 (L) 135 - 145 mmol/L   Potassium 3.6 3.5 - 5.1 mmol/L   Chloride 97 (L) 101 - 111 mmol/L   CO2 27 22 - 32 mmol/L   Glucose, Bld 106 (H) 65 - 99 mg/dL   BUN 11 6 - 20 mg/dL   Creatinine, Ser 0.95 0.61 - 1.24 mg/dL   Calcium 8.6 (L) 8.9 - 10.3 mg/dL   GFR calc non Af Amer >60 >60 mL/min   GFR calc Af Amer >60 >60 mL/min   Anion gap 9 5 - 15  CBC  Result Value Ref Range   WBC 5.4 4.0 - 10.5 K/uL   RBC 4.10 (L) 4.22 - 5.81 MIL/uL  Hemoglobin 11.8 (L) 13.0 - 17.0 g/dL   HCT 35.6 (L) 39.0 - 52.0 %   MCV 86.8 78.0 - 100.0 fL   MCH 28.8 26.0 - 34.0 pg   MCHC 33.1 30.0 - 36.0 g/dL   RDW 13.6 11.5 - 15.5 %   Platelets 234 150 - 400 K/uL  Urine microscopic-add on  Result Value Ref Range   RBC / HPF 3-6 <3 RBC/hpf  CBC  Result Value Ref Range   WBC 5.5 4.0 - 10.5 K/uL   RBC 4.07 (L) 4.22 - 5.81 MIL/uL   Hemoglobin 11.7 (L) 13.0 - 17.0 g/dL   HCT 35.3 (L) 39.0 - 52.0 %   MCV 86.7 78.0 - 100.0 fL   MCH 28.7 26.0 - 34.0 pg   MCHC 33.1 30.0 - 36.0 g/dL   RDW 13.6 11.5 - 15.5 %   Platelets 233 150 - 400 K/uL  Basic metabolic panel  Result Value Ref Range   Sodium 135 135 - 145 mmol/L    Potassium 3.4 (L) 3.5 - 5.1 mmol/L   Chloride 101 101 - 111 mmol/L   CO2 27 22 - 32 mmol/L   Glucose, Bld 90 65 - 99 mg/dL   BUN 7 6 - 20 mg/dL   Creatinine, Ser 0.85 0.61 - 1.24 mg/dL   Calcium 8.6 (L) 8.9 - 10.3 mg/dL   GFR calc non Af Amer >60 >60 mL/min   GFR calc Af Amer >60 >60 mL/min   Anion gap 7 5 - 15   Dg Mandible 4 Views  11/06/2014   CLINICAL DATA:  Pt c/o jaw pain x 2 months. no known injury, Sts approx 2 weeks ago he had a large lump on the middle of his chin (bottom) which opened and began draining. Sts a two days ago he had a second opening on the right part of his chin open. Yellow drainage noted from both areas with some swelling and redness.  EXAM: MANDIBLE - 4+ VIEW  COMPARISON:  None.  FINDINGS: No fracture.  Patient is mostly edentulous. There are 3 remaining anterior mandibular teeth which appear narrowed. These are consistent with the incisors and left lateral incisor. There is also an unerupted left third molar. There is no evidence of a root abscess. There is no mandibular lesion.  IMPRESSION: 1. No fracture.  No bone lesion. 2. Patient mostly edentulous.  No evidence of a dental abscess.   Electronically Signed   By: Lajean Manes M.D.   On: 11/06/2014 22:43   Dg Chest 2 View  10/27/2014   CLINICAL DATA:  Preoperative clearance  EXAM: CHEST  2 VIEW  COMPARISON:  None in PACs  FINDINGS: The lungs are adequately inflated and clear. The heart and pulmonary vascularity are normal. The mediastinum is normal in width. There is tortuosity of the descending thoracic aorta. The bony thorax is unremarkable.  IMPRESSION: There is no active cardiopulmonary disease.   Electronically Signed   By: David  Martinique M.D.   On: 10/27/2014 08:00   Ct Soft Tissue Neck W Contrast  11/07/2014   CLINICAL DATA:  Lower chin abscess for several weeks with drainage.  EXAM: CT NECK WITH CONTRAST  TECHNIQUE: Multidetector CT imaging of the neck was performed using the standard protocol following the  bolus administration of intravenous contrast.  CONTRAST:  34mL OMNIPAQUE IOHEXOL 300 MG/ML  SOLN  COMPARISON:  None.  FINDINGS: Pharynx and larynx: No evidence of mass lesion.  Salivary glands: Unremarkable  Thyroid: 3 cm (craniocaudal)  solitary nodule in the left thyroid gland. No invasive features or regional adenopathy.  Lymph nodes: Mild reactive enlargement of submental and submandibular lymph nodes. No necrotic appearing nodes.  Vascular: Cervical carotid atherosclerosis without notable stenosis. No venous thrombosis.  Limited intracranial: Negative  Visualized orbits: Negative  Mastoids and visualized paranasal sinuses: Bilateral mastoid opacification, greater on the left with under pneumatization and sclerosis.  Skeleton: There is mottled lucency within the mandibular symphysis and anterior right body with chronic periosteal reaction and fragmentation (a band of lucency across the diseased bone has the appearance of fracture, but is incomplete). There is contiguous thickening of regional soft tissues with a subcutaneous fluid collection in the right chin measuring 2 cm and consistent with abscess. There is also a 1cm rim enhancing collection around the buccal surface of the right mandible seen on image 42. The alveolar ridge is atrophic in the setting of nearly complete edentulous state (3 remaining mandibular incisors are present and carious) and there is mucosal thinning which could exposed bone. No mucosal based mass is seen. There is no reported history of head neck cancer or radiation therapy. No bisphosphonates on the patient's medication list.  High-grade right C3-4 foraminal stenosis from uncovertebral spurring.  Upper chest: Clear apical lungs.  Acute findings were called by telephone at the time of interpretation on 11/07/2014 at 2:04 am to Dr. Charlann Lange , who verbally acknowledged these results.  IMPRESSION: 1. Mandibular osteomyelitis with 2 cm subcutaneous chin abscess and 1 cm subperiosteal  abscess on the outer right mandible body. 2. 3 cm solitary thyroid nodule on the left. Recommend outpatient sonography. 3. Chronic bilateral mastoiditis.   Electronically Signed   By: Monte Fantasia M.D.   On: 11/07/2014 02:10    Imaging Review No results found. I, Ruger Saxer A, personally reviewed and evaluated these images and lab results as part of my medical decision-making.   EKG Interpretation None      MDM   Final diagnoses:  None    1. Mandibular osteomyelitis  IV Vancomycin and Zosyn provided. The patient appears comfortable, VSS. No fever or leukocytosis, however, CT evidence of osteomyelitis with failure of outpatient treatment on Clindamycin. He will be admitted for further care.     Charlann Lange, PA-C 11/10/14 [id number]  Charlesetta Shanks, MD 11/14/14 [phone]

## 2014-11-06 NOTE — Progress Notes (Addendum)
ANTIBIOTIC CONSULT NOTE - INITIAL  Pharmacy Consult for aztreonam Indication: osteomyelitis  Allergies  Allergen Reactions  . Penicillins Rash    Patient Measurements: Height: 5\' 8"  (172.7 cm) Weight: 234 lb (106.142 kg) IBW/kg (Calculated) : 68.4 Adjusted Body Weight:   Vital Signs: Temp: 97.7 F (36.5 C) (08/15 1932) Temp Source: Oral (08/15 1932) BP: 142/78 mmHg (08/15 2145) Pulse Rate: 69 (08/15 2145) Intake/Output from previous day:   Intake/Output from this shift:    Labs:  Recent Labs  11/06/14 1952  WBC 7.6  HGB 12.5*  PLT 261  CREATININE 1.25*   Estimated Creatinine Clearance: 63.1 mL/min (by C-G formula based on Cr of 1.25). No results for input(s): VANCOTROUGH, VANCOPEAK, VANCORANDOM, GENTTROUGH, GENTPEAK, GENTRANDOM, TOBRATROUGH, TOBRAPEAK, TOBRARND, AMIKACINPEAK, AMIKACINTROU, AMIKACIN in the last 72 hours.   Microbiology: No results found for this or any previous visit (from the past 720 hour(s)).  Medical History: Past Medical History  Diagnosis Date  . Gout   . Hyperlipidemia   . Hypertension   . Back pain     chronic back pain treated by Dr. Nelva Bush  . Anxiety     Medications:  Anti-infectives    Start     Dose/Rate Route Frequency Ordered Stop   11/06/14 2230  aztreonam (AZACTAM) 2 g in dextrose 5 % 50 mL IVPB     2 g 100 mL/hr over 30 Minutes Intravenous Every 8 hours 11/06/14 2213     11/06/14 2215  vancomycin (VANCOCIN) IVPB 1000 mg/200 mL premix  Status:  Discontinued     1,000 mg 200 mL/hr over 60 Minutes Intravenous  Once 11/06/14 2213 11/06/14 2214   11/06/14 2215  vancomycin (VANCOCIN) 2,000 mg in sodium chloride 0.9 % 500 mL IVPB     2,000 mg 250 mL/hr over 120 Minutes Intravenous  Once 11/06/14 2214       Assessment: 86 yom presented to the ED with jaw pain x 2 months. Recently diagnosed with oste and being treated with clindamycin. Now to change to aztreonam. Also ordered a 1x dose of vancomycin.  Aztreo 8/15>> Vanc x  1 8/15  Goal of Therapy:  Eradication of infection  Plan:  - Aztreonam 2gm IV Q8H - Change vancomycin to 2000mg  IV x 1 for loading dose - f/u plans to continue - F/u renal fxn, C&S, clinical status  Rumbarger, Rande Lawman 11/06/2014,10:16 PM   Addendum: Pharmacy consulted to start maintenance IV Vancomycin dose for osteomyelitis. Start Vancomycin 750 mg IV Q 12 hours. [facility] at [place], PharmD., BCPS Clinical Pharmacist Pager [phone]

## 2014-11-06 NOTE — ED Notes (Signed)
Pt c/o jaw pain x 2 months.  Sts approx 2 weeks ago he had a large lump on the middle of his chin (bottom) which opened and began draining.  Sts a two days ago he had a second opening on the right part of his chin open.  Yellow drainage noted from both areas with some swelling and redness.

## 2014-11-07 ENCOUNTER — Emergency Department (HOSPITAL_COMMUNITY): Payer: Medicare Other

## 2014-11-07 ENCOUNTER — Other Ambulatory Visit: Payer: Self-pay | Admitting: Internal Medicine

## 2014-11-07 ENCOUNTER — Encounter (HOSPITAL_COMMUNITY): Payer: Self-pay | Admitting: Radiology

## 2014-11-07 DIAGNOSIS — Z880 Allergy status to penicillin: Secondary | ICD-10-CM | POA: Diagnosis not present

## 2014-11-07 DIAGNOSIS — E861 Hypovolemia: Secondary | ICD-10-CM | POA: Diagnosis present

## 2014-11-07 DIAGNOSIS — N183 Chronic kidney disease, stage 3 (moderate): Secondary | ICD-10-CM | POA: Diagnosis not present

## 2014-11-07 DIAGNOSIS — E785 Hyperlipidemia, unspecified: Secondary | ICD-10-CM | POA: Diagnosis present

## 2014-11-07 DIAGNOSIS — F329 Major depressive disorder, single episode, unspecified: Secondary | ICD-10-CM | POA: Diagnosis present

## 2014-11-07 DIAGNOSIS — I10 Essential (primary) hypertension: Secondary | ICD-10-CM | POA: Diagnosis not present

## 2014-11-07 DIAGNOSIS — M272 Inflammatory conditions of jaws: Secondary | ICD-10-CM | POA: Diagnosis not present

## 2014-11-07 DIAGNOSIS — Z79891 Long term (current) use of opiate analgesic: Secondary | ICD-10-CM | POA: Diagnosis not present

## 2014-11-07 DIAGNOSIS — F419 Anxiety disorder, unspecified: Secondary | ICD-10-CM | POA: Diagnosis present

## 2014-11-07 DIAGNOSIS — H7013 Chronic mastoiditis, bilateral: Secondary | ICD-10-CM | POA: Diagnosis not present

## 2014-11-07 DIAGNOSIS — L0201 Cutaneous abscess of face: Secondary | ICD-10-CM | POA: Diagnosis not present

## 2014-11-07 DIAGNOSIS — Z7982 Long term (current) use of aspirin: Secondary | ICD-10-CM | POA: Diagnosis not present

## 2014-11-07 DIAGNOSIS — M549 Dorsalgia, unspecified: Secondary | ICD-10-CM | POA: Diagnosis present

## 2014-11-07 DIAGNOSIS — N189 Chronic kidney disease, unspecified: Secondary | ICD-10-CM | POA: Diagnosis present

## 2014-11-07 DIAGNOSIS — Z6835 Body mass index (BMI) 35.0-35.9, adult: Secondary | ICD-10-CM | POA: Diagnosis not present

## 2014-11-07 DIAGNOSIS — E041 Nontoxic single thyroid nodule: Secondary | ICD-10-CM | POA: Diagnosis present

## 2014-11-07 DIAGNOSIS — K089 Disorder of teeth and supporting structures, unspecified: Secondary | ICD-10-CM | POA: Diagnosis present

## 2014-11-07 DIAGNOSIS — Z87891 Personal history of nicotine dependence: Secondary | ICD-10-CM | POA: Diagnosis present

## 2014-11-07 DIAGNOSIS — M109 Gout, unspecified: Secondary | ICD-10-CM | POA: Diagnosis not present

## 2014-11-07 DIAGNOSIS — K122 Cellulitis and abscess of mouth: Secondary | ICD-10-CM | POA: Diagnosis not present

## 2014-11-07 DIAGNOSIS — F17210 Nicotine dependence, cigarettes, uncomplicated: Secondary | ICD-10-CM

## 2014-11-07 DIAGNOSIS — I129 Hypertensive chronic kidney disease with stage 1 through stage 4 chronic kidney disease, or unspecified chronic kidney disease: Secondary | ICD-10-CM | POA: Diagnosis present

## 2014-11-07 DIAGNOSIS — Z79899 Other long term (current) drug therapy: Secondary | ICD-10-CM | POA: Diagnosis not present

## 2014-11-07 HISTORY — DX: Morbid (severe) obesity due to excess calories: E66.01

## 2014-11-07 HISTORY — DX: Disorder of teeth and supporting structures, unspecified: K08.9

## 2014-11-07 HISTORY — DX: Nontoxic single thyroid nodule: E04.1

## 2014-11-07 HISTORY — DX: Inflammatory conditions of jaws: M27.2

## 2014-11-07 HISTORY — DX: Chronic mastoiditis, bilateral: H70.13

## 2014-11-07 HISTORY — DX: Nicotine dependence, cigarettes, uncomplicated: F17.210

## 2014-11-07 LAB — BASIC METABOLIC PANEL
Anion gap: 9 (ref 5–15)
BUN: 11 mg/dL (ref 6–20)
CO2: 27 mmol/L (ref 22–32)
CREATININE: 0.95 mg/dL (ref 0.61–1.24)
Calcium: 8.6 mg/dL — ABNORMAL LOW (ref 8.9–10.3)
Chloride: 97 mmol/L — ABNORMAL LOW (ref 101–111)
GFR calc Af Amer: >60 mL/min (ref >60)
Glucose, Bld: 106 mg/dL — ABNORMAL HIGH (ref 65–99)
Potassium: 3.6 mmol/L (ref 3.5–5.1)
SODIUM: 133 mmol/L — AB (ref 135–145)

## 2014-11-07 LAB — URINALYSIS, ROUTINE W REFLEX MICROSCOPIC
BILIRUBIN URINE: NEGATIVE
Glucose, UA: NEGATIVE mg/dL
KETONES UR: NEGATIVE mg/dL
Leukocytes, UA: NEGATIVE
Nitrite: NEGATIVE
PROTEIN: NEGATIVE mg/dL
Specific Gravity, Urine: 1.02 (ref 1.005–1.030)
UROBILINOGEN UA: 1 mg/dL (ref 0.0–1.0)
pH: 6.5 (ref 5.0–8.0)

## 2014-11-07 LAB — TSH: TSH: 1.447 u[IU]/mL (ref 0.350–4.500)

## 2014-11-07 LAB — CBC
HCT: 35.6 % — ABNORMAL LOW (ref 39.0–52.0)
Hemoglobin: 11.8 g/dL — ABNORMAL LOW (ref 13.0–17.0)
MCH: 28.8 pg (ref 26.0–34.0)
MCHC: 33.1 g/dL (ref 30.0–36.0)
MCV: 86.8 fL (ref 78.0–100.0)
PLATELETS: 234 10*3/uL (ref 150–400)
RBC: 4.1 MIL/uL — ABNORMAL LOW (ref 4.22–5.81)
RDW: 13.6 % (ref 11.5–15.5)
WBC: 5.4 10*3/uL (ref 4.0–10.5)

## 2014-11-07 LAB — URINE MICROSCOPIC-ADD ON

## 2014-11-07 LAB — LACTIC ACID, PLASMA: Lactic Acid, Venous: 0.8 mmol/L (ref 0.5–2.0)

## 2014-11-07 MED ORDER — CLINDAMYCIN PHOSPHATE 900 MG/50ML IV SOLN
900.0000 mg | Freq: Three times a day (TID) | INTRAVENOUS | Status: DC
  Administered 2014-11-07 – 2014-11-10 (×10): 900 mg via INTRAVENOUS
  Filled 2014-11-07 (×13): qty 50

## 2014-11-07 MED ORDER — SIMVASTATIN 40 MG PO TABS: 40.0000 mg | ORAL_TABLET | Freq: Every day | ORAL | Status: DC

## 2014-11-07 MED ORDER — OMEGA-3-ACID ETHYL ESTERS 1 G PO CAPS
1.0000 g | ORAL_CAPSULE | Freq: Two times a day (BID) | ORAL | Status: DC
  Administered 2014-11-07 – 2014-11-10 (×7): 1 g via ORAL
  Filled 2014-11-07 (×7): qty 1

## 2014-11-07 MED ORDER — ONDANSETRON HCL 4 MG/2ML IJ SOLN: 4.0000 mg | Freq: Four times a day (QID) | INTRAMUSCULAR | Status: DC | PRN

## 2014-11-07 MED ORDER — DEXTROSE-NACL 5-0.45 % IV SOLN
INTRAVENOUS | Status: DC
  Administered 2014-11-08 – 2014-11-10 (×3): via INTRAVENOUS

## 2014-11-07 MED ORDER — HYDROMORPHONE HCL 1 MG/ML IJ SOLN
0.5000 mg | Freq: Once | INTRAMUSCULAR | Status: AC
  Administered 2014-11-07: 0.5 mg via INTRAVENOUS
  Filled 2014-11-07: qty 1

## 2014-11-07 MED ORDER — VERAPAMIL HCL ER 240 MG PO TBCR
240.0000 mg | EXTENDED_RELEASE_TABLET | Freq: Every day | ORAL | Status: DC
  Administered 2014-11-07 – 2014-11-10 (×4): 240 mg via ORAL
  Filled 2014-11-07 (×4): qty 1

## 2014-11-07 MED ORDER — ENOXAPARIN SODIUM 60 MG/0.6ML ~~LOC~~ SOLN
60.0000 mg | SUBCUTANEOUS | Status: DC
  Administered 2014-11-08 – 2014-11-10 (×3): 60 mg via SUBCUTANEOUS
  Filled 2014-11-07 (×3): qty 0.6

## 2014-11-07 MED ORDER — OMEGA-3 FATTY ACIDS 1000 MG PO CAPS: 1.0000 g | ORAL_CAPSULE | Freq: Two times a day (BID) | ORAL | Status: DC

## 2014-11-07 MED ORDER — VARENICLINE TARTRATE 1 MG PO TABS
1.0000 mg | ORAL_TABLET | Freq: Two times a day (BID) | ORAL | Status: DC
Start: 2014-11-07 — End: 2014-11-10
  Administered 2014-11-07 – 2014-11-10 (×7): 1 mg via ORAL
  Filled 2014-11-07 (×9): qty 1

## 2014-11-07 MED ORDER — NIACIN ER (ANTIHYPERLIPIDEMIC) 1000 MG PO TBCR: 1000.0000 mg | EXTENDED_RELEASE_TABLET | Freq: Every day | ORAL | Status: DC

## 2014-11-07 MED ORDER — HYDROCODONE-ACETAMINOPHEN 5-325 MG PO TABS
1.0000 | ORAL_TABLET | ORAL | Status: DC | PRN
  Administered 2014-11-07 – 2014-11-10 (×9): 2 via ORAL
  Filled 2014-11-07 (×9): qty 2

## 2014-11-07 MED ORDER — ONDANSETRON HCL 4 MG PO TABS: 4.0000 mg | ORAL_TABLET | Freq: Four times a day (QID) | ORAL | Status: DC | PRN

## 2014-11-07 MED ORDER — NIACIN ER 500 MG PO CPCR
1000.0000 mg | ORAL_CAPSULE | Freq: Every day | ORAL | Status: DC
  Administered 2014-11-07 – 2014-11-09 (×3): 1000 mg via ORAL
  Filled 2014-11-07 (×4): qty 2

## 2014-11-07 MED ORDER — ASPIRIN 325 MG PO TABS
325.0000 mg | ORAL_TABLET | Freq: Every day | ORAL | Status: DC
  Administered 2014-11-07 – 2014-11-10 (×4): 325 mg via ORAL
  Filled 2014-11-07 (×4): qty 1

## 2014-11-07 MED ORDER — ALUM & MAG HYDROXIDE-SIMETH 200-200-20 MG/5ML PO SUSP: 30.0000 mL | Freq: Four times a day (QID) | ORAL | Status: DC | PRN

## 2014-11-07 MED ORDER — ENOXAPARIN SODIUM 40 MG/0.4ML ~~LOC~~ SOLN
40.0000 mg | SUBCUTANEOUS | Status: DC
  Administered 2014-11-07: 40 mg via SUBCUTANEOUS
  Filled 2014-11-07: qty 0.4

## 2014-11-07 MED ORDER — SODIUM CHLORIDE 0.9 % IV BOLUS (SEPSIS): 2000.0000 mL | Freq: Once | INTRAVENOUS | Status: DC

## 2014-11-07 MED ORDER — POLYETHYLENE GLYCOL 3350 17 G PO PACK: 17.0000 g | PACK | Freq: Every day | ORAL | Status: DC | PRN

## 2014-11-07 MED ORDER — MORPHINE SULFATE (PF) 4 MG/ML IV SOLN
6.0000 mg | Freq: Once | INTRAVENOUS | Status: AC
  Administered 2014-11-07: 6 mg via INTRAVENOUS

## 2014-11-07 MED ORDER — VANCOMYCIN HCL IN DEXTROSE 750-5 MG/150ML-% IV SOLN
750.0000 mg | Freq: Two times a day (BID) | INTRAVENOUS | Status: DC
  Filled 2014-11-07: qty 150

## 2014-11-07 MED ORDER — IOHEXOL 300 MG/ML  SOLN
75.0000 mL | Freq: Once | INTRAMUSCULAR | Status: AC | PRN
  Administered 2014-11-07: 75 mL via INTRAVENOUS

## 2014-11-07 MED ORDER — MORPHINE SULFATE (PF) 2 MG/ML IV SOLN
INTRAVENOUS | Status: AC
  Filled 2014-11-07: qty 1

## 2014-11-07 MED ORDER — CITALOPRAM HYDROBROMIDE 20 MG PO TABS
20.0000 mg | ORAL_TABLET | Freq: Every day | ORAL | Status: DC
  Administered 2014-11-07 – 2014-11-10 (×4): 20 mg via ORAL
  Filled 2014-11-07 (×4): qty 1

## 2014-11-07 MED ORDER — MORPHINE SULFATE (PF) 4 MG/ML IV SOLN
INTRAVENOUS | Status: AC
  Filled 2014-11-07: qty 1

## 2014-11-07 MED ORDER — ATORVASTATIN CALCIUM 10 MG PO TABS
20.0000 mg | ORAL_TABLET | Freq: Every day | ORAL | Status: DC
  Administered 2014-11-07 – 2014-11-09 (×3): 20 mg via ORAL
  Filled 2014-11-07 (×3): qty 2

## 2014-11-07 MED ORDER — ALLOPURINOL 300 MG PO TABS
300.0000 mg | ORAL_TABLET | Freq: Every day | ORAL | Status: DC
  Administered 2014-11-07 – 2014-11-10 (×4): 300 mg via ORAL
  Filled 2014-11-07 (×4): qty 1

## 2014-11-07 MED ORDER — CHOLECALCIFEROL 10 MCG (400 UNIT) PO TABS
800.0000 [IU] | ORAL_TABLET | Freq: Every day | ORAL | Status: DC
  Administered 2014-11-07 – 2014-11-10 (×4): 800 [IU] via ORAL
  Filled 2014-11-07 (×4): qty 2

## 2014-11-07 MED ORDER — VERAPAMIL HCL ER 240 MG PO CP24: 240.0000 mg | ORAL_CAPSULE | Freq: Every day | ORAL | Status: DC

## 2014-11-07 NOTE — Progress Notes (Signed)
I have seen and assessed patient and agree with Dr Melvia Heaps assessment and plan. ID has been consulted and antibiotics have been changed to IV Clindamycin and IV aztreonam per Dr Megan Salon. Patient has been seen in the outpatient setting by oral surgeon Dr.Golehon. [facility] oral surgeon's office and received a call back from surgical coordinator, Geralyn Corwin, who stated that oral surgeon, Dr.Golehon did not have privileges at [facility] to consult on the patient but had recommended patient undergo hyperbaric treatment at the [facility] wound care center and once infection is improving will need a referral to be seen by Dr. Fredricka Bonine ENT/oncology at the [facility] for further evaluation. Will continue current empiric IV antibiotics as directed by infectious diseases. Will need to curbside Dr. Enrique Sack of dentistry for further recommendations tomorrow 11/08/2014.

## 2014-11-07 NOTE — Consult Note (Signed)
[facility] for Infectious Disease    Date of Admission:  11/06/2014          Reason for Consult: Mandibular osteomyelitis complicated by submandibular abscess    Referring Physician: Dr. Irine Seal  Principal Problem:   Osteomyelitis of mandible Active Problems:   Poor dentition   Hypertension   Gout   Chronic kidney disease   Hyperlipidemia   Morbid obesity   Back pain   Anxiety   Chronic mastoiditis of both sides   Thyroid nodule   Cigarette smoker   . allopurinol  300 mg Oral Daily  . aspirin  325 mg Oral Daily  . atorvastatin  20 mg Oral q1800  . aztreonam  2 g Intravenous Q8H  . cholecalciferol  800 Units Oral Daily  . citalopram  20 mg Oral Daily  . [START ON 11/08/2014] enoxaparin (LOVENOX) injection  60 mg Subcutaneous Q24H  . morphine      . niacin  1,000 mg Oral QHS  . omega-3 acid ethyl esters  1 g Oral BID  . sodium chloride  2,000 mL Intravenous Once  . vancomycin  750 mg Intravenous Q12H  . varenicline  1 mg Oral BID  . verapamil  240 mg Oral Daily    Recommendations: 1. Continue aztreonam 2. Start IV clindamycin 3. Discontinue vancomycin   Assessment: Marcus Carr has chronic mandibular osteomyelitis with abscess formation. His few remaining teeth are carious. He really needs further evaluation and consideration for oral surgery. I will treat him with IV aztreonam and clindamycin given his history of penicillin allergy.   HPI: Marcus Carr is a 74 y.o. male with poor dentition who has been struggling with dental pain and infection for several months. He was treated for right maxillary sinusitis and possible tooth abscess in April. More recently he began to develop jaw pain. He had x-rays done by his dentist and was referred to Dr. Consuella Lose [facility] Oral Surgery. Initially he was felt to have a bony abnormality (torus mandible) and was scheduled for surgery. Apparently earlier this month surgery was canceled and he was  started on oral clindamycin. He has had increasingly severe jaw pain and developed an enlarging mass in the submandibular region that began to drain recently leading to his admission last night. CT scan reveals mandibular osteomyelitis with adjacent abscess. He has a history of developing hives after penicillin several years ago.   Review of Systems: Pertinent items are noted in HPI.  Past Medical History  Diagnosis Date  . Gout   . Hyperlipidemia   . Hypertension   . Back pain     chronic back pain treated by Dr. Nelva Bush  . Anxiety   . Chronic kidney disease     Social History  Substance Use Topics  . Smoking status: Current Every Day Smoker -- 1.50 packs/day for 50 years    Types: Cigarettes  . Smokeless tobacco: Never Used  . Alcohol Use: No    Family History  Problem Relation Age of Onset  . Cancer Mother     esophageal  . Heart attack Father    Allergies  Allergen Reactions  . Penicillins Rash    OBJECTIVE: Blood pressure 143/80, pulse 72, temperature 97.4 F (36.3 C), temperature source Oral, resp. rate 16, height 5\' 8"  (1.727 m), weight 234 lb (106.142 kg), SpO2 96 %. General: He is in no distress sitting up on the side of the bed eating lunch  Skin: No rash Oral: Many missing teeth. No oropharyngeal lesions. Draining sub-mandibular abscess Lungs: Clear Cor: Regular S1 and S2 with no murmurs   Lab Results Lab Results  Component Value Date   WBC 5.4 11/07/2014   HGB 11.8* 11/07/2014   HCT 35.6* 11/07/2014   MCV 86.8 11/07/2014   PLT 234 11/07/2014    Lab Results  Component Value Date   CREATININE 0.95 11/07/2014   BUN 11 11/07/2014   NA 133* 11/07/2014   K 3.6 11/07/2014   CL 97* 11/07/2014   CO2 27 11/07/2014    Lab Results  Component Value Date   ALT 26 11/06/2014   AST 29 11/06/2014   ALKPHOS 73 11/06/2014   BILITOT 0.3 11/06/2014     Microbiology: Recent Results (from the past 240 hour(s))  Wound culture     Status: None (Preliminary  result)   Collection Time: 11/07/14  1:05 AM  Result Value Ref Range Status   Specimen Description WOUND FACE  Final   Special Requests NONE  Final   Gram Stain   Final    FEW WBC PRESENT, PREDOMINANTLY PMN NO SQUAMOUS EPITHELIAL CELLS SEEN RARE GRAM POSITIVE COCCI IN PAIRS Performed at [facility]    Culture PENDING  Incomplete   Report Status PENDING  Incomplete   CT NECK WITH CONTRAST  TECHNIQUE: Multidetector CT imaging of the neck was performed using the standard protocol following the bolus administration of intravenous contrast.  CONTRAST: 58mL OMNIPAQUE IOHEXOL 300 MG/ML SOLN  COMPARISON: None.  FINDINGS: Pharynx and larynx: No evidence of mass lesion.  Salivary glands: Unremarkable  Thyroid: 3 cm (craniocaudal) solitary nodule in the left thyroid gland. No invasive features or regional adenopathy.  Lymph nodes: Mild reactive enlargement of submental and submandibular lymph nodes. No necrotic appearing nodes.  Vascular: Cervical carotid atherosclerosis without notable stenosis. No venous thrombosis.  Limited intracranial: Negative  Visualized orbits: Negative  Mastoids and visualized paranasal sinuses: Bilateral mastoid opacification, greater on the left with under pneumatization and sclerosis.  Skeleton: There is mottled lucency within the mandibular symphysis and anterior right body with chronic periosteal reaction and fragmentation (a band of lucency across the diseased bone has the appearance of fracture, but is incomplete). There is contiguous thickening of regional soft tissues with a subcutaneous fluid collection in the right chin measuring 2 cm and consistent with abscess. There is also a 1cm rim enhancing collection around the buccal surface of the right mandible seen on image 42. The alveolar ridge is atrophic in the setting of nearly complete edentulous state (3 remaining mandibular incisors are present and carious)  and there is mucosal thinning which could exposed bone. No mucosal based mass is seen. There is no reported history of head neck cancer or radiation therapy. No bisphosphonates on the patient's medication list.  High-grade right C3-4 foraminal stenosis from uncovertebral spurring.  Upper chest: Clear apical lungs.  Acute findings were called by telephone at the time of interpretation on 11/07/2014 at 2:04 am to Dr. Charlann Lange , who verbally acknowledged these results.  IMPRESSION: 1. Mandibular osteomyelitis with 2 cm subcutaneous chin abscess and 1 cm subperiosteal abscess on the outer right mandible body. 2. 3 cm solitary thyroid nodule on the left. Recommend outpatient sonography. 3. Chronic bilateral mastoiditis.   Electronically Signed  By: Monte Fantasia M.D.  On: 11/07/2014 02:10  Michel Bickers, MD [place] [phone] pager   [phone] cell 11/07/2014, 12:32 PM

## 2014-11-07 NOTE — ED Notes (Signed)
Patient transported to CT scan . 

## 2014-11-07 NOTE — H&P (Signed)
[facility] History and Physical  Marcus Carr WUJ:[id number] DOB: 05/07/1940 DOA: 11/06/2014  Referring physician: ER MD PCP: Sharion Balloon, FNP   Chief Complaint: Pus draining under chin  HPI: Marcus Carr is a 74 y.o. male with hx of gout, HL, HTN and anxiety who about 1- 2 months ago was treated with po clindamycin for "osteomyelitis" of his jaw , according to the patient's wife. Now he is presenting to ED with draining of pus from a large lump under the middle of his chin which started about 2 weeks ago.  Then 2 days ago there began a second opening on the under the R part of his chin w yellow drainage. Came to ED where no fever, WBC wnl.  CT of neck with contrast shows "mandibular osteomyelitis with 2 cm subcutaneous chin abscess and 1 cm subperiosteal abscess on the outer R mandible body."  He rec'd IV vanc/ and aztreonam in the ED (PCN allergic), we are asked to admit.   Patient denies any fevers, chills, sweats. No CP, abd pain, n/v/d, no joint pain. No trouble swallowing or pain in the throat. No HA. No confusion.  Where does patient live at home w family Can patient participate in ADLs? yes  Past Medical History  Past Medical History  Diagnosis Date  . Gout   . Hyperlipidemia   . Hypertension   . Back pain     chronic back pain treated by Dr. Nelva Bush  . Anxiety    Past Surgical History  Past Surgical History  Procedure Laterality Date  . Appendectomy    . Tonsillectomy     Family History  Family History  Problem Relation Age of Onset  . Cancer Mother     esophageal  . Heart attack Father    Social History  reports that he has been smoking Cigarettes.  He has a 75 pack-year smoking history. He has never used smokeless tobacco. He reports that he does not drink alcohol or use illicit drugs. Allergies  Allergies  Allergen Reactions  . Penicillins Rash   Home medications Prior to Admission medications   Medication Sig Start Date End Date  Taking? Authorizing Provider  allopurinol (ZYLOPRIM) 300 MG tablet Take 1 tablet (300 mg total) by mouth daily. 02/06/14  Yes Lysbeth Penner, FNP  aspirin 325 MG tablet Take 325 mg by mouth daily.   Yes Historical Provider, MD  cholecalciferol (VITAMIN D) 400 UNITS TABS tablet Take 800 Units by mouth daily.   Yes Historical Provider, MD  citalopram (CELEXA) 20 MG tablet TAKE 1 TABLET DAILY 02/06/14  Yes Lysbeth Penner, FNP  clindamycin (CLEOCIN) 300 MG capsule Take 1 capsule (300 mg total) by mouth 3 (three) times daily. 10/09/14  Yes Claretta Fraise, MD  fish oil-omega-3 fatty acids 1000 MG capsule Take 1 g by mouth 2 (two) times daily.    Yes Historical Provider, MD  hydrochlorothiazide (HYDRODIURIL) 12.5 MG tablet Take 1 tablet (12.5 mg total) by mouth daily. 06/23/14  Yes Sharion Balloon, FNP  niacin (NIASPAN) 1000 MG CR tablet TAKE 1 TABLET (1,000 MG TOTAL) BY MOUTH AT BEDTIME. 09/08/14  Yes Sharion Balloon, FNP  oxyCODONE-acetaminophen (PERCOCET) 10-325 MG per tablet Take 1 tablet by mouth every 12 (twelve) hours as needed. 10/30/14  Yes Sharion Balloon, FNP  simvastatin (ZOCOR) 40 MG tablet Take 1 tablet (40 mg total) by mouth at bedtime. 02/06/14  Yes Lysbeth Penner, FNP  varenicline (CHANTIX CONTINUING MONTH PAK) 1  MG tablet Take 1 tablet (1 mg total) by mouth 2 (two) times daily. 10/26/14  Yes Sharion Balloon, FNP  verapamil (VERELAN PM) 240 MG 24 hr capsule One po bid 06/23/14  Yes Sharion Balloon, FNP  sulfamethoxazole-trimethoprim (BACTRIM DS,SEPTRA DS) 800-160 MG per tablet Take 1 tablet by mouth 2 (two) times daily. Patient not taking: Reported on 11/06/2014 10/30/14   Sharion Balloon, FNP   Liver Function Tests  Recent Labs Lab 11/06/14 1952  AST 29  ALT 26  ALKPHOS 73  BILITOT 0.3  PROT 7.1  ALBUMIN 3.2*   No results for input(s): LIPASE, AMYLASE in the last 168 hours. CBC  Recent Labs Lab 11/06/14 1952  WBC 7.6  NEUTROABS 5.5  HGB 12.5*  HCT 37.5*  MCV 88.2  PLT 951    Basic Metabolic Panel  Recent Labs Lab 11/06/14 1952  NA 135  K 4.3  CL 96*  CO2 28  GLUCOSE 102*  BUN 16  CREATININE 1.25*  CALCIUM 9.6     Filed Vitals:   11/07/14 0015 11/07/14 0030 11/07/14 0101 11/07/14 0131  BP: 145/78 139/116 134/84 140/75  Pulse: 67 67 70 73  Temp:      TempSrc:      Resp:   16 16  Height:      Weight:      SpO2: 94% 96% 95% 95%   Exam: Alert, no distress, calm, looks uncomfortable No rash, cyanosis or gangrene Sclera anicteric, throat clear/ dry Under chin there is indurated inflamed tissue and two areas which are draining, one under middle of the chin and another to the right under chin +cervical post LAN Chest is clear bilat RRR no MRG Abd soft ntnd no hsm or ascites +bs GU normal male MS no joint effusions/ deformity No LE or UE edema No foot / arm wounds or ulcers Neuro is alert, nf, Ox 3  Na 135  K 4.3  CO2 28  BUN 16  Creat 1.25   Ca 9.6   glo 102   Alb 3.2   lft's wnl WBC 7k  Hb 12  plt 261   Venous lactic acid 0.8  CT neck >  1. Mandibular osteomyelitis with 2 cm subcutaneous chin abscess and 1 cm subperiosteal abscess on the outer right mandible body. 2. 3 cm solitary thyroid nodule on the left. Recommend outpatient sonography. 3. Chronic bilateral mastoiditis.    Assessment: 1. Mandibular osteomyelitis - admit, IV vanc/ aztreonam (PCN allergic), IVF's 2. CKD stage 3 - baseline creat 1.0- 1.2 3. HTN cont verapamil, hold hctz 4. Hypovolemia - looks a bit dry on exam 5. Gout 6. Depression 7. HL  Plan - abx, IVF's, wound cx, pain control   DVT Prophylaxis lovenox  Code Status: full  Family Communication: d/w family at bedside  Disposition Plan: home when stable    [place] [facility] Pager [phone]  If 7PM-7AM, please contact night-coverage [email or web address] Password [email or web address] 11/07/2014, 2:20 AM

## 2014-11-07 NOTE — Progress Notes (Signed)
Utilization review completed.  

## 2014-11-08 ENCOUNTER — Encounter (HOSPITAL_COMMUNITY): Payer: Self-pay | Admitting: Dentistry

## 2014-11-08 DIAGNOSIS — M272 Inflammatory conditions of jaws: Secondary | ICD-10-CM

## 2014-11-08 DIAGNOSIS — N183 Chronic kidney disease, stage 3 (moderate): Secondary | ICD-10-CM

## 2014-11-08 DIAGNOSIS — F419 Anxiety disorder, unspecified: Secondary | ICD-10-CM

## 2014-11-08 LAB — CBC
HCT: 35.3 % — ABNORMAL LOW (ref 39.0–52.0)
Hemoglobin: 11.7 g/dL — ABNORMAL LOW (ref 13.0–17.0)
MCH: 28.7 pg (ref 26.0–34.0)
MCHC: 33.1 g/dL (ref 30.0–36.0)
MCV: 86.7 fL (ref 78.0–100.0)
PLATELETS: 233 10*3/uL (ref 150–400)
RBC: 4.07 MIL/uL — AB (ref 4.22–5.81)
RDW: 13.6 % (ref 11.5–15.5)
WBC: 5.5 10*3/uL (ref 4.0–10.5)

## 2014-11-08 LAB — BASIC METABOLIC PANEL
Anion gap: 7 (ref 5–15)
BUN: 7 mg/dL (ref 6–20)
CALCIUM: 8.6 mg/dL — AB (ref 8.9–10.3)
CO2: 27 mmol/L (ref 22–32)
Chloride: 101 mmol/L (ref 101–111)
Creatinine, Ser: 0.85 mg/dL (ref 0.61–1.24)
GFR calc Af Amer: >60 mL/min (ref >60)
Glucose, Bld: 90 mg/dL (ref 65–99)
POTASSIUM: 3.4 mmol/L — AB (ref 3.5–5.1)
SODIUM: 135 mmol/L (ref 135–145)

## 2014-11-08 MED ORDER — POTASSIUM CHLORIDE CRYS ER 20 MEQ PO TBCR
20.0000 meq | EXTENDED_RELEASE_TABLET | Freq: Once | ORAL | Status: AC
  Administered 2014-11-08: 20 meq via ORAL

## 2014-11-08 NOTE — Progress Notes (Signed)
Patient ID: Marcus Carr, male   DOB: January 30, 1941, 74 y.o.   MRN: [id number]         [facility] for Infectious Disease    Date of Admission:  11/06/2014   He has had multiple outpatient courses of oral antibiotics including 4 rounds of clindamycin therapy recently        Day 2 IV clindamycin and aztreonam         Principal Problem:   Osteomyelitis of mandible Active Problems:   Poor dentition   Hypertension   Gout   Chronic kidney disease   Hyperlipidemia   Morbid obesity   Back pain   Anxiety   Chronic mastoiditis of both sides   Thyroid nodule   Cigarette smoker   Abscess, jaw   . allopurinol  300 mg Oral Daily  . aspirin  325 mg Oral Daily  . atorvastatin  20 mg Oral q1800  . aztreonam  2 g Intravenous Q8H  . cholecalciferol  800 Units Oral Daily  . citalopram  20 mg Oral Daily  . clindamycin (CLEOCIN) IV  900 mg Intravenous 3 times per day  . enoxaparin (LOVENOX) injection  60 mg Subcutaneous Q24H  . niacin  1,000 mg Oral QHS  . omega-3 acid ethyl esters  1 g Oral BID  . potassium chloride  20 mEq Oral Once  . sodium chloride  2,000 mL Intravenous Once  . varenicline  1 mg Oral BID  . verapamil  240 mg Oral Daily    OBJECTIVE: Filed Vitals:   11/07/14 0945 11/07/14 1457 11/07/14 2213 11/08/14 0554  BP: 143/80 121/65 154/81 155/80  Pulse:  57 71 67  Temp:  98.1 F (36.7 C) 97.7 F (36.5 C) 97.8 F (36.6 C)  TempSrc:  Oral Oral Oral  Resp:  16 16 16   Height:      Weight:      SpO2:  94% 97% 96%   Body mass index is 35.59 kg/(m^2).  General: he was sound asleep when I came by today. I did not wake him for examination. He has a bandage covering his submandibular draining abscess  Lab Results Lab Results  Component Value Date   WBC 5.5 11/08/2014   HGB 11.7* 11/08/2014   HCT 35.3* 11/08/2014   MCV 86.7 11/08/2014   PLT 233 11/08/2014    Lab Results  Component Value Date   CREATININE 0.85 11/08/2014   BUN 7 11/08/2014   NA 135  11/08/2014   K 3.4* 11/08/2014   CL 101 11/08/2014   CO2 27 11/08/2014    Lab Results  Component Value Date   ALT 26 11/06/2014   AST 29 11/06/2014   ALKPHOS 73 11/06/2014   BILITOT 0.3 11/06/2014     Microbiology: Recent Results (from the past 240 hour(s))  Wound culture     Status: None (Preliminary result)   Collection Time: 11/07/14  1:05 AM  Result Value Ref Range Status   Specimen Description WOUND FACE  Final   Special Requests NONE  Final   Gram Stain   Final    FEW WBC PRESENT, PREDOMINANTLY PMN NO SQUAMOUS EPITHELIAL CELLS SEEN RARE GRAM POSITIVE COCCI IN PAIRS Performed at [facility]    Culture   Final    Culture reincubated for better growth Performed at [facility]    Report Status PENDING  Incomplete    Problem List Items Addressed This Visit      High   * (Principal)Osteomyelitis of  mandible    I will continue empiric IV clindamycin and aztreonam for his mandibular osteomyelitis and abscess. I agree with [facility] and oral surgery consultation.      Relevant Medications   aztreonam (AZACTAM) 2 g in dextrose 5 % 50 mL IVPB   vancomycin (VANCOCIN) 2,000 mg in sodium chloride 0.9 % 500 mL IVPB (Completed)   clindamycin (CLEOCIN) IVPB 900 mg     Unprioritized   Abscess, jaw    Other Visit Diagnoses    Osteomyelitis    -  Primary    Relevant Medications    aztreonam (AZACTAM) 2 g in dextrose 5 % 50 mL IVPB    vancomycin (VANCOCIN) 2,000 mg in sodium chloride 0.9 % 500 mL IVPB (Completed)    clindamycin (CLEOCIN) IVPB 900 mg    Other Relevant Orders    MR FACE/TRIGEMINAL WO CM        Michel Bickers, MD [facility] for [facility] [place] [phone] pager   [phone] cell 11/08/2014, 4:41 PM

## 2014-11-08 NOTE — Progress Notes (Signed)
PROGRESS NOTE  Marcus Carr WER:[id number] DOB: 08/06/1940 DOA: 11/06/2014 PCP: Sharion Balloon, FNP  Brief history 74 year old male with a history of osteomyelitis of his right jaw for which he had been following [place] oral surgery. The patient states that he recently finished a course of oral antibiotics. However for the past 2-3 weeks he has noted increasing drainage from the right side of his and able. Approximately 2 days prior to admission he noticed a new opening closer to the midline of his jaw. He denied any fevers, chills, chest pain, shortness breath.  He was treated for right maxillary sinusitis and possible tooth abscess in April 2016. CT of his neck revealed mandibular osteomyelitis with 2 cm thin abscess and 1 cm subperiosteal abscess on the right mandibular body. Assessment/Plan: Mandibular osteomyelitis -appreciate ID recommendations -I have consulted oral surgery, Dr. Enrique Sack -order Panorex -continue IV abx hypertension  -Continue verapamil  -Hold HCTZ Hyperlipidemia  -Continue Lipitor, niacin, Lovaza Tobacco abuse  -continue Chantix -tobacco cessation discussed Anxiety  -Continue Celexa  Chronic mastoiditis  CKD stage III  -Baseline creatinine 1.0-1.2     Family Communication:   Wife updated at beside Disposition Plan:   Home when medically stable       Procedures/Studies: Dg Mandible 4 Views  11/06/2014   CLINICAL DATA:  Pt c/o jaw pain x 2 months. no known injury, Sts approx 2 weeks ago he had a large lump on the middle of his chin (bottom) which opened and began draining. Sts a two days ago he had a second opening on the right part of his chin open. Yellow drainage noted from both areas with some swelling and redness.  EXAM: MANDIBLE - 4+ VIEW  COMPARISON:  None.  FINDINGS: No fracture.  Patient is mostly edentulous. There are 3 remaining anterior mandibular teeth which appear narrowed. These are consistent with the incisors and left  lateral incisor. There is also an unerupted left third molar. There is no evidence of a root abscess. There is no mandibular lesion.  IMPRESSION: 1. No fracture.  No bone lesion. 2. Patient mostly edentulous.  No evidence of a dental abscess.   Electronically Signed   By: Lajean Manes M.D.   On: 11/06/2014 22:43   Dg Chest 2 View  10/27/2014   CLINICAL DATA:  Preoperative clearance  EXAM: CHEST  2 VIEW  COMPARISON:  None in PACs  FINDINGS: The lungs are adequately inflated and clear. The heart and pulmonary vascularity are normal. The mediastinum is normal in width. There is tortuosity of the descending thoracic aorta. The bony thorax is unremarkable.  IMPRESSION: There is no active cardiopulmonary disease.   Electronically Signed   By: Ura Yingling  [place] M.D.   On: 10/27/2014 08:00   Ct Soft Tissue Neck W Contrast  11/07/2014   CLINICAL DATA:  Lower chin abscess for several weeks with drainage.  EXAM: CT NECK WITH CONTRAST  TECHNIQUE: Multidetector CT imaging of the neck was performed using the standard protocol following the bolus administration of intravenous contrast.  CONTRAST:  84mL OMNIPAQUE IOHEXOL 300 MG/ML  SOLN  COMPARISON:  None.  FINDINGS: Pharynx and larynx: No evidence of mass lesion.  Salivary glands: Unremarkable  Thyroid: 3 cm (craniocaudal) solitary nodule in the left thyroid gland. No invasive features or regional adenopathy.  Lymph nodes: Mild reactive enlargement of submental and submandibular lymph nodes. No necrotic appearing nodes.  Vascular: Cervical carotid atherosclerosis without notable stenosis. No  venous thrombosis.  Limited intracranial: Negative  Visualized orbits: Negative  Mastoids and visualized paranasal sinuses: Bilateral mastoid opacification, greater on the left with under pneumatization and sclerosis.  Skeleton: There is mottled lucency within the mandibular symphysis and anterior right body with chronic periosteal reaction and fragmentation (a band of lucency across the  diseased bone has the appearance of fracture, but is incomplete). There is contiguous thickening of regional soft tissues with a subcutaneous fluid collection in the right chin measuring 2 cm and consistent with abscess. There is also a 1cm rim enhancing collection around the buccal surface of the right mandible seen on image 42. The alveolar ridge is atrophic in the setting of nearly complete edentulous state (3 remaining mandibular incisors are present and carious) and there is mucosal thinning which could exposed bone. No mucosal based mass is seen. There is no reported history of head neck cancer or radiation therapy. No bisphosphonates on the patient's medication list.  High-grade right C3-4 foraminal stenosis from uncovertebral spurring.  Upper chest: Clear apical lungs.  Acute findings were called by telephone at the time of interpretation on 11/07/2014 at 2:04 am to Dr. Charlann Lange , who verbally acknowledged these results.  IMPRESSION: 1. Mandibular osteomyelitis with 2 cm subcutaneous chin abscess and 1 cm subperiosteal abscess on the outer right mandible body. 2. 3 cm solitary thyroid nodule on the left. Recommend outpatient sonography. 3. Chronic bilateral mastoiditis.   Electronically Signed   By: Monte Fantasia M.D.   On: 11/07/2014 02:10         Subjective: Patient complains of right-sided jaw pain that is somewhat better than the day of admission. Denies any fevers, chills, chest pain, tightness breath, nausea, vomiting or diarrhea, coughing, dysuria, hematuria. No rashes.   Objective: Filed Vitals:   11/07/14 0945 11/07/14 1457 11/07/14 2213 11/08/14 0554  BP: 143/80 121/65 154/81 155/80  Pulse:  57 71 67  Temp:  98.1 F (36.7 C) 97.7 F (36.5 C) 97.8 F (36.6 C)  TempSrc:  Oral Oral Oral  Resp:  16 16 16   Height:      Weight:      SpO2:  94% 97% 96%    Intake/Output Summary (Last 24 hours) at 11/08/14 1309 Last data filed at 11/08/14 6387  Gross per 24 hour  Intake    1560 ml  Output    150 ml  Net   1410 ml   Weight change:  Exam:   General:  Pt is alert, follows commands appropriately, not in acute distress  HEENT: No icterus, No thrush, [place]/AT right lower mandible with purulent drainage without crepitance or necrosis. ;   Cardiovascular: RRR, S1/S2, no rubs, no gallops  Respiratory: CTA bilaterally, no wheezing, no crackles, no rhonchi  Abdomen: Soft/+BS, non tender, non distended, no guarding  Extremities: No edema, No lymphangitis, No petechiae, No rashes, no synovitis  Data Reviewed: Basic Metabolic Panel:  Recent Labs Lab 11/06/14 1952 11/07/14 0628 11/08/14 0610  NA 135 133* 135  K 4.3 3.6 3.4*  CL 96* 97* 101  CO2 28 27 27   GLUCOSE 102* 106* 90  BUN 16 11 7   CREATININE 1.25* 0.95 0.85  CALCIUM 9.6 8.6* 8.6*   Liver Function Tests:  Recent Labs Lab 11/06/14 1952  AST 29  ALT 26  ALKPHOS 73  BILITOT 0.3  PROT 7.1  ALBUMIN 3.2*   No results for input(s): LIPASE, AMYLASE in the last 168 hours. No results for input(s): AMMONIA in the last 168  hours. CBC:  Recent Labs Lab 11/06/14 1952 11/07/14 0628 11/08/14 0610  WBC 7.6 5.4 5.5  NEUTROABS 5.5  --   --   HGB 12.5* 11.8* 11.7*  HCT 37.5* 35.6* 35.3*  MCV 88.2 86.8 86.7  PLT 261 234 233   Cardiac Enzymes: No results for input(s): CKTOTAL, CKMB, CKMBINDEX, TROPONINI in the last 168 hours. BNP: Invalid input(s): POCBNP CBG: No results for input(s): GLUCAP in the last 168 hours.  Recent Results (from the past 240 hour(s))  Wound culture     Status: None (Preliminary result)   Collection Time: 11/07/14  1:05 AM  Result Value Ref Range Status   Specimen Description WOUND FACE  Final   Special Requests NONE  Final   Gram Stain   Final    FEW WBC PRESENT, PREDOMINANTLY PMN NO SQUAMOUS EPITHELIAL CELLS SEEN RARE GRAM POSITIVE COCCI IN PAIRS Performed at [facility]    Culture   Final    Culture reincubated for better growth Performed at  [facility]    Report Status PENDING  Incomplete     Scheduled Meds: . allopurinol  300 mg Oral Daily  . aspirin  325 mg Oral Daily  . atorvastatin  20 mg Oral q1800  . aztreonam  2 g Intravenous Q8H  . cholecalciferol  800 Units Oral Daily  . citalopram  20 mg Oral Daily  . clindamycin (CLEOCIN) IV  900 mg Intravenous 3 times per day  . enoxaparin (LOVENOX) injection  60 mg Subcutaneous Q24H  . niacin  1,000 mg Oral QHS  . omega-3 acid ethyl esters  1 g Oral BID  . sodium chloride  2,000 mL Intravenous Once  . varenicline  1 mg Oral BID  . verapamil  240 mg Oral Daily   Continuous Infusions: . dextrose 5 % and 0.45% NaCl       Jaxtyn Linville, DO  [facility] Pager [phone]  If 7PM-7AM, please contact night-coverage [email or web address] Password [email or web address] 11/08/2014, 1:09 PM   LOS: 1 day

## 2014-11-08 NOTE — Assessment & Plan Note (Signed)
I will continue empiric IV clindamycin and aztreonam for his mandibular osteomyelitis and abscess. I agree with Panorex and oral surgery consultation.

## 2014-11-08 NOTE — Consult Note (Signed)
DENTAL CONSULTATION  Date of Consultation:  11/08/2014 Patient Name:   Marcus Carr Date of Birth:   [birth year] Medical Record Number: [id number]  VITALS: BP 155/80 mmHg  Pulse 67  Temp(Src) 97.8 F (36.6 C) (Oral)  Resp 16  Ht 5\' 8"  (1.727 m)  Wt 234 lb (106.142 kg)  BMI 35.59 kg/m2  SpO2 96%  CHIEF COMPLAINT: Patient was referred by Dr. Shanon Brow Tat for evaluation of osteomyelitis of the right mandible.  HPI: Marcus Carr is a 74 year old male that was recently diagnosed with osteomyelitis of the right mandible after admission on 11/06/2014. Patient is currently on IV antibiotic therapy with clindamycin and Azactam under the care of infectious disease specialist, Dr. Michel Bickers.  Dental consultation requested to evaluate patient and assist in coordination of definitive care for the patient.  The patient has a history of developing jaw pain in July of 2016. Patient saw Dr. Wynetta Emery (primary dentist ) in [place], [place] at that time. Patient was subsequently referred to an oral surgeon, Dr. Consuella Lose, for further evaluation and treatment as indicated. Patient was found to have mandibular right exposed bone and ulcerated mandibular tori. The patient was reevaluated by Dr. Olena Heckle (oral surgeon) on 10/24/2014. Patient was prescribed oral antibiotic therapy at that time and was referred to the [facility] for hyperbaric oxygen therapy. The patient saw Dr. Con Memos and was in the process of obtaining prior authorization for hyperbaric oxygen therapy with definitive surgical intervention to be performed by Dr. Ulyses Amor and Neck Surgeon) at [facility] after appropriate healing.  Patient subsequently developed increased pain and jaw swelling and presented to the emergency department on 11/06/2014. Patient was experiencing 10 out of 10 dull and sharp pain that occurred in an intermittent fashion. Pain last for hours at a time. Pain with spontaneous at times. Pain is  currently 1 out of 10 in intensity with antibiotic therapy and pain medication. The patient denies ever having had previous radiation therapy to the head and neck area. The patient denies having had any oral bisphosphonate therapy or osteoporosis therapy.  Prior to July of 2016, the patient had not seen a dentist for over 15 years by report. Patient denies having any dentures. The patient indicates that he has 3 remaining teeth and was not aware of the presence of an impacted mandibular left wisdom tooth #17.  PROBLEM LIST: Patient Active Problem List   Diagnosis Date Noted  . Abscess, jaw   . Osteomyelitis of mandible 11/07/2014  . Hypertension 11/07/2014  . Gout 11/07/2014  . Chronic kidney disease 11/07/2014  . Hyperlipidemia 11/07/2014  . Morbid obesity 11/07/2014  . Back pain 11/07/2014  . Anxiety 11/07/2014  . Chronic mastoiditis of both sides 11/07/2014  . Thyroid nodule 11/07/2014  . Cigarette smoker 11/07/2014  . Poor dentition 11/07/2014    PMH: Past Medical History  Diagnosis Date  . Gout   . Hyperlipidemia   . Hypertension   . Back pain     chronic back pain treated by Dr. Nelva Bush  . Anxiety   . Chronic kidney disease     PSH: Past Surgical History  Procedure Laterality Date  . Appendectomy    . Tonsillectomy      ALLERGIES: Allergies  Allergen Reactions  . Penicillins Rash    MEDICATIONS: Current Facility-Administered Medications  Medication Dose Route Frequency Provider Last Rate Last Dose  . allopurinol (ZYLOPRIM) tablet 300 mg  300 mg Oral Daily Roney Jaffe, MD   300 mg at  11/08/14 0916  . alum & mag hydroxide-simeth (MAALOX/MYLANTA) 200-200-20 MG/5ML suspension 30 mL  30 mL Oral Q6H PRN Roney Jaffe, MD      . aspirin tablet 325 mg  325 mg Oral Daily Roney Jaffe, MD   325 mg at 11/08/14 0917  . atorvastatin (LIPITOR) tablet 20 mg  20 mg Oral q1800 Roney Jaffe, MD   20 mg at 11/07/14 1749  . aztreonam (AZACTAM) 2 g in dextrose 5 % 50 mL  IVPB  2 g Intravenous Q8H Rachel L Rumbarger, RPH 100 mL/hr at 11/08/14 0753 2 g at 11/08/14 0753  . cholecalciferol (VITAMIN D) tablet 800 Units  800 Units Oral Daily Roney Jaffe, MD   800 Units at 11/07/14 0944  . citalopram (CELEXA) tablet 20 mg  20 mg Oral Daily Roney Jaffe, MD   20 mg at 11/08/14 0916  . clindamycin (CLEOCIN) IVPB 900 mg  900 mg Intravenous 3 times per day Michel Bickers, MD   900 mg at 11/08/14 [phone]  . dextrose 5 %-0.45 % sodium chloride infusion   Intravenous Continuous Roney Jaffe, MD      . enoxaparin (LOVENOX) injection 60 mg  60 mg Subcutaneous Q24H Eugenie Filler, MD   60 mg at 11/08/14 0917  . HYDROcodone-acetaminophen (NORCO/VICODIN) 5-325 MG per tablet 1-2 tablet  1-2 tablet Oral Q4H PRN Roney Jaffe, MD   2 tablet at 11/08/14 0640  . niacin CR capsule 1,000 mg  1,000 mg Oral QHS Roney Jaffe, MD   1,000 mg at 11/07/14 2217  . omega-3 acid ethyl esters (LOVAZA) capsule 1 g  1 g Oral BID Roney Jaffe, MD   1 g at 11/08/14 9371  . ondansetron (ZOFRAN) tablet 4 mg  4 mg Oral Q6H PRN Roney Jaffe, MD       Or  . ondansetron [facility]) injection 4 mg  4 mg Intravenous Q6H PRN Roney Jaffe, MD      . polyethylene glycol (MIRALAX / GLYCOLAX) packet 17 g  17 g Oral Daily PRN Roney Jaffe, MD      . potassium chloride SA (K-DUR,KLOR-CON) CR tablet 20 mEq  20 mEq Oral Once Shanon Brow Tat, MD      . sodium chloride 0.9 % bolus 2,000 mL  2,000 mL Intravenous Once Roney Jaffe, MD   2,000 mL at 11/07/14 0343  . varenicline (CHANTIX) tablet 1 mg  1 mg Oral BID Roney Jaffe, MD   1 mg at 11/07/14 2301  . verapamil (CALAN-SR) CR tablet 240 mg  240 mg Oral Daily Roney Jaffe, MD   240 mg at 11/07/14 0945    LABS: Lab Results  Component Value Date   WBC 5.5 11/08/2014   HGB 11.7* 11/08/2014   HCT 35.3* 11/08/2014   MCV 86.7 11/08/2014   PLT 233 11/08/2014      Component Value Date/Time   NA 135 11/08/2014 0610   NA 137 10/26/2014 1513   K 3.4*  11/08/2014 0610   CL 101 11/08/2014 0610   CO2 27 11/08/2014 0610   GLUCOSE 90 11/08/2014 0610   GLUCOSE 97 10/26/2014 1513   BUN 7 11/08/2014 0610   BUN 19 10/26/2014 1513   CREATININE 0.85 11/08/2014 0610   CALCIUM 8.6* 11/08/2014 0610   GFRNONAA >60 11/08/2014 0610   GFRAA >60 11/08/2014 0610   No results found for: INR, PROTIME No results found for: PTT  SOCIAL HISTORY: Social History   Social History  . Marital Status: Married    Spouse Name:  N/A  . Number of Children: N/A  . Years of Education: N/A   Occupational History  . Not on file.   Social History Main Topics  . Smoking status: Current Every Day Smoker -- 1.50 packs/day for 50 years    Types: Cigarettes  . Smokeless tobacco: Never Used  . Alcohol Use: No  . Drug Use: No  . Sexual Activity: Not on file   Other Topics Concern  . Not on file   Social History Narrative    FAMILY HISTORY: Family History  Problem Relation Age of Onset  . Cancer Mother     esophageal  . Heart attack Father     REVIEW OF SYSTEMS: Reviewed from chart for this admission.  DENTAL HISTORY: CHIEF COMPLAINT: Patient was referred by Dr. Shanon Brow Tat for evaluation of osteomyelitis of the right mandible.  HPI: Marcus Carr is a 74 year old male that was recently diagnosed with osteomyelitis of the right mandible after admission on 11/06/2014. Patient is currently on IV antibiotic therapy with clindamycin and Azactam under the care of infectious disease specialist, Dr. Michel Bickers.  Dental consultation requested to evaluate patient and assist in coordination of definitive care for the patient.  The patient has a history of developing jaw pain in July of 2016. Patient saw Dr. Wynetta Emery (primary dentist ) in [place], [place] at that time. Patient was subsequently referred to an oral surgeon, Dr. Consuella Lose, for further evaluation and treatment as indicated. Patient was found to have mandibular right exposed bone and  ulcerated mandibular tori. The patient was reevaluated by Dr. Olena Heckle (oral surgeon) on 10/24/2014. Patient was prescribed oral antibiotic therapy at that time and was referred to the [facility] for hyperbaric oxygen therapy. The patient saw Dr. Con Memos and was in the process of obtaining prior authorization for hyperbaric oxygen therapy with definitive surgical intervention to be performed by Dr. Ulyses Amor and Neck Surgeon) at [facility] after appropriate healing.  Patient subsequently developed increased pain and jaw swelling and presented to the emergency department on 11/06/2014. Patient was experiencing 10 out of 10 dull and sharp pain that occurred in an intermittent fashion. Pain last for hours at a time. Pain with spontaneous at times. Pain is currently 1 out of 10 in intensity with antibiotic therapy and pain medication. The patient denies ever having had previous radiation therapy to the head and neck area. The patient denies having had any oral bisphosphonate therapy or osteoporosis therapy.  Prior to July of 2016, the patient had not seen a dentist for over 15 years by report. Patient denies having any dentures. The patient indicates that he has 3 remaining teeth and was not aware of the presence of an impacted mandibular left wisdom tooth #17.  DENTAL EXAMINATION: GENERAL: The patient is a well-developed, well-nourished male sitting in a chair by the window. HEAD AND NECK: The patient has a fistulous tract in the area of the right submental area.  This is covered by a bandage and minimal examination is allowed at this time. INTRAORAL EXAM: Patient has normal saliva. Patient has bilateral mandibular lingual tori. Patient has tooth numbers 22, 23, 24 noted clinically. The impacted wisdom tooth #17 is not noted clinically. There is atrophy of the edentulous alveolar ridges. No exposed bone is noted at this time. DENTITION: Patient is missing all teeth with exception of the impacted  tooth numbers 17, 22, 23, and 24.  PERIODONTAL: Patient has chronic periodontitis with plaque and calculus accumulations, gingival recession, and incipient  tooth mobility of tooth numbers 22, 23, and 24. DENTAL CARIES/SUBOPTIMAL RESTORATIONS: No obvious dental caries are associated with remaining teeth although patient has significant plaque that prevents a thorough clinical exam at this time. ENDODONTIC: The patient is complaining of jaw pain, but does not point to any teeth as being the offending teeth. No obvious periapical radiolucencies are noted involving tooth numbers 22, 23, 24. Ideally a periapical radiograph is to be obtained of this area to rule out incipient periapical pathology. Tooth #17 does appear to have some coronal radiolucency. CROWN AND BRIDGE: There are no crown or bridge restorations. PROSTHODONTIC: Patient denies having any dentures. OCCLUSION: Patient has a poor occlusal scheme secondary to multiple missing teeth and lack of replacement of all missing teeth with clinically acceptable dental prostheses.  RADIOGRAPHIC INTERPRETATION: An orthopantogram was taken as part of a mandible series on 11/06/2014. This is suboptimal. There are multiple missing teeth with exception of impacted tooth #17, 22, 23, and 24. There is a radiolucency involving the right body of mandible and extending into the mandibular anterior mandible that may be consistent with the osteomyelitis diagnosis. Tooth #17 is a full bony impaction with coronal radiolucency. No obvious dental caries are noted. Patient has moderate bone loss noted.  CLINICAL DATA: Lower chin abscess for several weeks with drainage.  EXAM: CT NECK WITH CONTRAST  TECHNIQUE: Multidetector CT imaging of the neck was performed using the standard protocol following the bolus administration of intravenous contrast.  CONTRAST: 59mL OMNIPAQUE IOHEXOL 300 MG/ML SOLN  COMPARISON: None.  FINDINGS: Pharynx and larynx: No  evidence of mass lesion.  Salivary glands: Unremarkable  Thyroid: 3 cm (craniocaudal) solitary nodule in the left thyroid gland. No invasive features or regional adenopathy.  Lymph nodes: Mild reactive enlargement of submental and submandibular lymph nodes. No necrotic appearing nodes.  Vascular: Cervical carotid atherosclerosis without notable stenosis. No venous thrombosis.  Limited intracranial: Negative  Visualized orbits: Negative  Mastoids and visualized paranasal sinuses: Bilateral mastoid opacification, greater on the left with under pneumatization and sclerosis.  Skeleton: There is mottled lucency within the mandibular symphysis and anterior right body with chronic periosteal reaction and fragmentation (a band of lucency across the diseased bone has the appearance of fracture, but is incomplete). There is contiguous thickening of regional soft tissues with a subcutaneous fluid collection in the right chin measuring 2 cm and consistent with abscess. There is also a 1cm rim enhancing collection around the buccal surface of the right mandible seen on image 42. The alveolar ridge is atrophic in the setting of nearly complete edentulous state (3 remaining mandibular incisors are present and carious) and there is mucosal thinning which could exposed bone. No mucosal based mass is seen. There is no reported history of head neck cancer or radiation therapy. No bisphosphonates on the patient's medication list.  High-grade right C3-4 foraminal stenosis from uncovertebral spurring.  Upper chest: Clear apical lungs.  Acute findings were called by telephone at the time of interpretation on 11/07/2014 at 2:04 am to Dr. Charlann Lange , who verbally acknowledged these results.  IMPRESSION: 1. Mandibular osteomyelitis with 2 cm subcutaneous chin abscess and 1 cm subperiosteal abscess on the outer right mandible body. 2. 3 cm solitary thyroid nodule on the left.  Recommend outpatient sonography. 3. Chronic bilateral mastoiditis.   Electronically Signed  By: Monte Fantasia M.D.  On: 11/07/2014 02:10    ASSESSMENTS: 1. Osteomyelitis of the right mandible with current IV antibiotic therapy 2. Impacted tooth #17 3. Chronic periodontitis  with bone loss 4. Incipient tooth mobility 5. Multiple missing teeth with the exception of tooth numbers 17, 22, 23, and 24. 6. Malocclusion 7. Bilateral mandibular lingual tori  PLAN/RECOMMENDATIONS: 1. I discussed the risks, benefits, and complications of various treatment options with the patient in relationship to his medical and dental conditions. Patient will need continued antibiotic therapy per infectious disease consultation. The patient will then need to follow-up with Dr. Consuella Lose [phone], oral surgeon, for discussion of plan of care in the future considering referral to Dr. Conley Canal at [facility] and to determine whether hyperbaric oxygen therapy is indicated at this time. Patient eventually may benefit from extraction of remaining teeth with alveoloplasty and pre-prosthetic surgery as indicated along with fabrication of upper and lower complete dentures after adequate healing pending acceptable resolution of the infective process.   2. Discussion of findings with medical team and coordination of future medical and dental care as needed.  Lenn Cal, DDS

## 2014-11-09 DIAGNOSIS — H7013 Chronic mastoiditis, bilateral: Secondary | ICD-10-CM

## 2014-11-09 LAB — WOUND CULTURE

## 2014-11-09 NOTE — Progress Notes (Signed)
Patient ID: Marcus Carr, male   DOB: 06-Mar-1941, 74 y.o.   MRN: [id number]         [facility]    Date of Admission:  11/06/2014           Day 3 aztreonam and clindamycin  Principal Problem:   Osteomyelitis of mandible Active Problems:   Poor dentition   Hypertension   Gout   Chronic kidney disease   Hyperlipidemia   Morbid obesity   Back pain   Anxiety   Chronic mastoiditis of both sides   Thyroid nodule   Cigarette smoker   Abscess, jaw   . allopurinol  300 mg Oral Daily  . aspirin  325 mg Oral Daily  . atorvastatin  20 mg Oral q1800  . aztreonam  2 g Intravenous Q8H  . cholecalciferol  800 Units Oral Daily  . citalopram  20 mg Oral Daily  . clindamycin (CLEOCIN) IV  900 mg Intravenous 3 times per day  . enoxaparin (LOVENOX) injection  60 mg Subcutaneous Q24H  . niacin  1,000 mg Oral QHS  . omega-3 acid ethyl esters  1 g Oral BID  . sodium chloride  2,000 mL Intravenous Once  . varenicline  1 mg Oral BID  . verapamil  240 mg Oral Daily    Subjective: His pain is under a little bit better control.  Review of Systems: Pertinent items are noted in HPI.  Past Medical History  Diagnosis Date  . Gout   . Hyperlipidemia   . Hypertension   . Back pain     chronic back pain treated by Dr. Nelva Bush  . Anxiety   . Chronic kidney disease     Social History  Substance Use Topics  . Smoking status: Current Every Day Smoker -- 1.50 packs/day for 50 years    Types: Cigarettes  . Smokeless tobacco: Never Used  . Alcohol Use: No    Family History  Problem Relation Age of Onset  . Cancer Mother     esophageal  . Heart attack Father    Allergies  Allergen Reactions  . Penicillins Rash    OBJECTIVE: Filed Vitals:   11/08/14 0554 11/08/14 0949 11/08/14 2039 11/09/14 0600  BP: 155/80 155/80 148/71 145/80  Pulse: 67  69 70  Temp: 97.8 F (36.6 C)  97.7 F (36.5 C) 97.9 F (36.6 C)  TempSrc: Oral   Oral  Resp: 16  16 18     Height:      Weight:      SpO2: 96%  96% 97%   Body mass index is 35.59 kg/(m^2).  General: he is sitting up in a chair visiting with his wife Oral: Submandibular swelling slightly decreased was some drainage on gauze dressing  Lab Results Lab Results  Component Value Date   WBC 5.5 11/08/2014   HGB 11.7* 11/08/2014   HCT 35.3* 11/08/2014   MCV 86.7 11/08/2014   PLT 233 11/08/2014    Lab Results  Component Value Date   CREATININE 0.85 11/08/2014   BUN 7 11/08/2014   NA 135 11/08/2014   K 3.4* 11/08/2014   CL 101 11/08/2014   CO2 27 11/08/2014    Lab Results  Component Value Date   ALT 26 11/06/2014   AST 29 11/06/2014   ALKPHOS 73 11/06/2014   BILITOT 0.3 11/06/2014     Microbiology: Recent Results (from the past 240 hour(s))  Wound culture     Status: None  Collection Time: 11/07/14  1:05 AM  Result Value Ref Range Status   Specimen Description WOUND FACE  Final   Special Requests NONE  Final   Gram Stain   Final    FEW WBC PRESENT, PREDOMINANTLY PMN NO SQUAMOUS EPITHELIAL CELLS SEEN RARE GRAM POSITIVE COCCI IN PAIRS Performed at [facility]    Culture   Final    MULTIPLE ORGANISMS PRESENT, NONE PREDOMINANT Note: NO STAPHYLOCOCCUS AUREUS ISOLATED NO GROUP A STREP (S.PYOGENES) ISOLATED Performed at [facility]    Report Status 11/09/2014 FINAL  Final   He has chronic mandibular osteomyelitis complicated by submandibular abscess. He has been treated with multiple short courses of antibiotic therapy. When he is ready for discharge I would convert him back to oral clindamycin and plan on at least 6 weeks of therapy. I also believe that he needs definitive oral surgery. If the plan is to refer him to [facility] I would prefer that that be done sooner rather than later. Hyperbaric oxygen has been discussed but there is very little research evidence for utility in the setting of osteomyelitis.  Michel Bickers,  MD [facility] [phone] pager   [phone] cell 11/09/2014, 2:18 PM

## 2014-11-09 NOTE — Care Management Important Message (Signed)
Important Message  Patient Details  Name: Marcus Carr MRN: [id number] Date of Birth: May 06, 1940   Medicare Important Message Given:  Yes-second notification given    Delorse Lek 11/09/2014, 2:21 PM

## 2014-11-09 NOTE — Consult Note (Signed)
Reason for Consult: Mandibular osteomyelitis Referring Physician: Orson Eva, MD  Marcus Carr is an 74 y.o. male.  HPI: Long history of dental neglect, more recent history of chronic drainage from the external chin skin. He has been thoroughly evaluated by infectious disease, dental medicine, and has seen Dr. Lewanda Rife. He was supposed to have surgical treatment of his dental disease.  Past Medical History  Diagnosis Date  . Gout   . Hyperlipidemia   . Hypertension   . Back pain     chronic back pain treated by Dr. Nelva Bush  . Anxiety   . Chronic kidney disease     Past Surgical History  Procedure Laterality Date  . Appendectomy    . Tonsillectomy      Family History  Problem Relation Age of Onset  . Cancer Mother     esophageal  . Heart attack Father     Social History:  reports that he has been smoking Cigarettes.  He has a 75 pack-year smoking history. He has never used smokeless tobacco. He reports that he does not drink alcohol or use illicit drugs.  Allergies:  Allergies  Allergen Reactions  . Penicillins Rash    Medications: Reviewed  Results for orders placed or performed during the hospital encounter of 11/06/14 (from the past 48 hour(s))  CBC     Status: Abnormal   Collection Time: 11/08/14  6:10 AM  Result Value Ref Range   WBC 5.5 4.0 - 10.5 K/uL   RBC 4.07 (L) 4.22 - 5.81 MIL/uL   Hemoglobin 11.7 (L) 13.0 - 17.0 g/dL   HCT 35.3 (L) 39.0 - 52.0 %   MCV 86.7 78.0 - 100.0 fL   MCH 28.7 26.0 - 34.0 pg   MCHC 33.1 30.0 - 36.0 g/dL   RDW 13.6 11.5 - 15.5 %   Platelets 233 150 - 400 K/uL  Basic metabolic panel     Status: Abnormal   Collection Time: 11/08/14  6:10 AM  Result Value Ref Range   Sodium 135 135 - 145 mmol/L   Potassium 3.4 (L) 3.5 - 5.1 mmol/L   Chloride 101 101 - 111 mmol/L   CO2 27 22 - 32 mmol/L   Glucose, Bld 90 65 - 99 mg/dL   BUN 7 6 - 20 mg/dL   Creatinine, Ser 0.85 0.61 - 1.24 mg/dL   Calcium 8.6 (L) 8.9 - 10.3 mg/dL   GFR  calc non Af Amer >60 >60 mL/min   GFR calc Af Amer >60 >60 mL/min    Comment: (NOTE) The eGFR has been calculated using the CKD EPI equation. This calculation has not been validated in all clinical situations. eGFR's persistently <60 mL/min signify possible Chronic Kidney Disease.    Anion gap 7 5 - 15    No results found.  NGE:[id number] except as listed in admit H&P  Blood pressure 145/80, pulse 70, temperature 97.9 F (36.6 C), temperature source Oral, resp. rate 18, height _0  (1.727 m), weight 106.142 kg (234 lb), SpO2 97 %.  PHYSICAL EXAM: Overall appearance:  Healthy appearing, in no distress Head:  Normocephalic, atraumatic. Ears: External ears appear normal. Nose: External nose is healthy in appearance. Internal nasal exam free of any lesions or obstruction. Oral Cavity:  Remaining dentition is in poor shape with diffuse periodontal disease. There are no mucosal masses identified. Neuro:  No identifiable neurologic deficits. Neck: No palpable neck masses. There is some crusting and residual induration of the submental skin but no palpable  fluid-filled cavity.  Studies Reviewed: CT neck  Procedures: none   Assessment/Plan: There is no persistent drainable abscess currently. Recommend continue with the original plan of oral surgical debridement and tooth extraction. I have nothing to offer this unfortunate gentleman. Call if needed.  Marcus Carr 11/09/2014, 5:32 PM

## 2014-11-09 NOTE — Progress Notes (Signed)
ANTIBIOTIC CONSULT NOTE - FOLLOW UP  Pharmacy Consult for Aztreonam Indication: mandibular osteomyelitis  Allergies  Allergen Reactions  . Penicillins Rash    Patient Measurements: Height: 5\' 8"  (172.7 cm) Weight: 234 lb (106.142 kg) IBW/kg (Calculated) : 68.4  Vital Signs: Temp: 97.9 F (36.6 C) (08/18 0600) Temp Source: Oral (08/18 0600) BP: 145/80 mmHg (08/18 0600) Pulse Rate: 70 (08/18 0600) Intake/Output from previous day: 08/17 0701 - 08/18 0700 In: 2520 [P.O.:1320; I.V.:1200] Out: -  Intake/Output from this shift:    Labs:  Recent Labs  11/06/14 1952 11/07/14 0628 11/08/14 0610  WBC 7.6 5.4 5.5  HGB 12.5* 11.8* 11.7*  PLT 261 234 233  CREATININE 1.25* 0.95 0.85   Estimated Creatinine Clearance: 92.8 mL/min (by C-G formula based on Cr of 0.85). No results for input(s): VANCOTROUGH, VANCOPEAK, VANCORANDOM, GENTTROUGH, GENTPEAK, GENTRANDOM, TOBRATROUGH, TOBRAPEAK, TOBRARND, AMIKACINPEAK, AMIKACINTROU, AMIKACIN in the last 72 hours.   Microbiology: Recent Results (from the past 720 hour(s))  Wound culture     Status: None   Collection Time: 11/07/14  1:05 AM  Result Value Ref Range Status   Specimen Description WOUND FACE  Final   Special Requests NONE  Final   Gram Stain   Final    FEW WBC PRESENT, PREDOMINANTLY PMN NO SQUAMOUS EPITHELIAL CELLS SEEN RARE GRAM POSITIVE COCCI IN PAIRS Performed at [facility]    Culture   Final    MULTIPLE ORGANISMS PRESENT, NONE PREDOMINANT Note: NO STAPHYLOCOCCUS AUREUS ISOLATED NO GROUP A STREP (S.PYOGENES) ISOLATED Performed at [facility]    Report Status 11/09/2014 FINAL  Final    Anti-infectives    Start     Dose/Rate Route Frequency Ordered Stop   11/07/14 1400  clindamycin (CLEOCIN) IVPB 900 mg     900 mg 100 mL/hr over 30 Minutes Intravenous 3 times per day 11/07/14 1250     11/07/14 1200  vancomycin (VANCOCIN) IVPB 750 mg/150 ml premix  Status:  Discontinued     750 mg 150 mL/hr  over 60 Minutes Intravenous Every 12 hours 11/07/14 0315 11/07/14 1249   11/06/14 2230  aztreonam (AZACTAM) 2 g in dextrose 5 % 50 mL IVPB     2 g 100 mL/hr over 30 Minutes Intravenous Every 8 hours 11/06/14 2213     11/06/14 2215  vancomycin (VANCOCIN) IVPB 1000 mg/200 mL premix  Status:  Discontinued     1,000 mg 200 mL/hr over 60 Minutes Intravenous  Once 11/06/14 2213 11/06/14 2214   11/06/14 2215  vancomycin (VANCOCIN) 2,000 mg in sodium chloride 0.9 % 500 mL IVPB     2,000 mg 250 mL/hr over 120 Minutes Intravenous  Once 11/06/14 2214 11/07/14 0236      Assessment: 74 yo M continues on day#4 of IV antibiotics for mandibular abscess/osteo.  His renal function remains stable.  Afeb, WBC wnl.  ID and oral surgery are also consulted.  8/16 wound cx data = multi organisms, none predominant  Goal of Therapy:  Eradication of Infection Renal dose adjustment  Plan:  Continue Aztreonam 2gm IV q8h - anticipate no further renal dose adjustment needed.  Rx will sign off.  [employer], Pharm.D., BCPS Clinical Pharmacist Pager [phone] 11/09/2014 1:39 PM

## 2014-11-09 NOTE — Progress Notes (Signed)
PROGRESS NOTE  Marcus Carr NWG:[id number] DOB: [birth year] DOA: 11/06/2014 PCP: Sharion Balloon, FNP   Brief history 74 year old male with a history of osteomyelitis of his right jaw for which he had been following [place] oral surgery. The patient states that he recently finished a course of oral antibiotics. However for the past 2-3 weeks he has noted increasing drainage from the right side of his and able. Approximately 2 days prior to admission he noticed a new opening closer to the midline of his jaw. He denied any fevers, chills, chest pain, shortness breath. He was treated for right maxillary sinusitis and possible tooth abscess in April 2016. CT of his neck revealed mandibular osteomyelitis with 2 cm thin abscess and 1 cm subperiosteal abscess on the right mandibular body. Assessment/Plan: Mandibular osteomyelitis -appreciate ID recommendations -I have consulted oral surgery, Dr. Eulah Citizen input -order Panorex-no bone lesion, no dental abscess -continue IV abx -placed consult to [facility]NT for possible I&D hypertension  -Continue verapamil  -Hold HCTZ Hyperlipidemia  -Continue Lipitor, niacin, Lovaza Tobacco abuse  -continue Chantix -tobacco cessation discussed Anxiety  -Continue Celexa  Chronic mastoiditis  CKD stage III  -Baseline creatinine 1.0-1.2     Family Communication: Wife updated at beside Disposition Plan: Home pending ENT input     Procedures/Studies: Dg Mandible 4 Views  11/06/2014   CLINICAL DATA:  Pt c/o jaw pain x 2 months. no known injury, Sts approx 2 weeks ago he had a large lump on the middle of his chin (bottom) which opened and began draining. Sts a two days ago he had a second opening on the right part of his chin open. Yellow drainage noted from both areas with some swelling and redness.  EXAM: MANDIBLE - 4+ VIEW  COMPARISON:  None.  FINDINGS: No fracture.  Patient is mostly edentulous. There are 3  remaining anterior mandibular teeth which appear narrowed. These are consistent with the incisors and left lateral incisor. There is also an unerupted left third molar. There is no evidence of a root abscess. There is no mandibular lesion.  IMPRESSION: 1. No fracture.  No bone lesion. 2. Patient mostly edentulous.  No evidence of a dental abscess.   Electronically Signed   By: Lajean Manes M.D.   On: 11/06/2014 22:43   Dg Chest 2 View  10/27/2014   CLINICAL DATA:  Preoperative clearance  EXAM: CHEST  2 VIEW  COMPARISON:  None in PACs  FINDINGS: The lungs are adequately inflated and clear. The heart and pulmonary vascularity are normal. The mediastinum is normal in width. There is tortuosity of the descending thoracic aorta. The bony thorax is unremarkable.  IMPRESSION: There is no active cardiopulmonary disease.   Electronically Signed   By: Joeanne Robicheaux  [place] M.D.   On: 10/27/2014 08:00   Ct Soft Tissue Neck W Contrast  11/07/2014   CLINICAL DATA:  Lower chin abscess for several weeks with drainage.  EXAM: CT NECK WITH CONTRAST  TECHNIQUE: Multidetector CT imaging of the neck was performed using the standard protocol following the bolus administration of intravenous contrast.  CONTRAST:  68mL OMNIPAQUE IOHEXOL 300 MG/ML  SOLN  COMPARISON:  None.  FINDINGS: Pharynx and larynx: No evidence of mass lesion.  Salivary glands: Unremarkable  Thyroid: 3 cm (craniocaudal) solitary nodule in the left thyroid gland. No invasive features or regional adenopathy.  Lymph nodes: Mild reactive enlargement of submental and submandibular lymph nodes. No necrotic appearing nodes.  Vascular: Cervical carotid atherosclerosis without notable stenosis. No venous thrombosis.  Limited intracranial: Negative  Visualized orbits: Negative  Mastoids and visualized paranasal sinuses: Bilateral mastoid opacification, greater on the left with under pneumatization and sclerosis.  Skeleton: There is mottled lucency within the mandibular symphysis  and anterior right body with chronic periosteal reaction and fragmentation (a band of lucency across the diseased bone has the appearance of fracture, but is incomplete). There is contiguous thickening of regional soft tissues with a subcutaneous fluid collection in the right chin measuring 2 cm and consistent with abscess. There is also a 1cm rim enhancing collection around the buccal surface of the right mandible seen on image 42. The alveolar ridge is atrophic in the setting of nearly complete edentulous state (3 remaining mandibular incisors are present and carious) and there is mucosal thinning which could exposed bone. No mucosal based mass is seen. There is no reported history of head neck cancer or radiation therapy. No bisphosphonates on the patient's medication list.  High-grade right C3-4 foraminal stenosis from uncovertebral spurring.  Upper chest: Clear apical lungs.  Acute findings were called by telephone at the time of interpretation on 11/07/2014 at 2:04 am to Dr. Charlann Lange , who verbally acknowledged these results.  IMPRESSION: 1. Mandibular osteomyelitis with 2 cm subcutaneous chin abscess and 1 cm subperiosteal abscess on the outer right mandible body. 2. 3 cm solitary thyroid nodule on the left. Recommend outpatient sonography. 3. Chronic bilateral mastoiditis.   Electronically Signed   By: Monte Fantasia M.D.   On: 11/07/2014 02:10         Subjective: Patient denies fevers, chills, headache, chest pain, dyspnea, nausea, vomiting, diarrhea, abdominal pain, dysuria, hematuria Overall he is feeling better. Jaw is less painful. Denies any fevers, chills, chest pain, shortness breath, nausea, vomiting, diarrhea.  Objective: Filed Vitals:   11/08/14 0554 11/08/14 0949 11/08/14 2039 11/09/14 0600  BP: 155/80 155/80 148/71 145/80  Pulse: 67  69 70  Temp: 97.8 F (36.6 C)  97.7 F (36.5 C) 97.9 F (36.6 C)  TempSrc: Oral   Oral  Resp: 16  16 18   Height:      Weight:        SpO2: 96%  96% 97%    Intake/Output Summary (Last 24 hours) at 11/09/14 1559 Last data filed at 11/09/14 0615  Gross per 24 hour  Intake   1920 ml  Output      0 ml  Net   1920 ml   Weight change:  Exam:   General:  Pt is alert, follows commands appropriately, not in acute distress  HEENT: No icterus, No thrush, No neck mass, [place]/AT; right jaw also mandibular area with induration and fullness. Tender to palpation. No purulent drainage.  Cardiovascular: RRR, S1/S2, no rubs, no gallops  Respiratory: Diminished breath hospital to auscultation. No wheezing.  Abdomen: Soft/+BS, non tender, non distended, no guarding  Extremities: No edema, No lymphangitis, No petechiae, No rashes, no synovitis  Data Reviewed: Basic Metabolic Panel:  Recent Labs Lab 11/06/14 1952 11/07/14 0628 11/08/14 0610  NA 135 133* 135  K 4.3 3.6 3.4*  CL 96* 97* 101  CO2 28 27 27   GLUCOSE 102* 106* 90  BUN 16 11 7   CREATININE 1.25* 0.95 0.85  CALCIUM 9.6 8.6* 8.6*   Liver Function Tests:  Recent Labs Lab 11/06/14 1952  AST 29  ALT 26  ALKPHOS 73  BILITOT 0.3  PROT 7.1  ALBUMIN 3.2*   No results  for input(s): LIPASE, AMYLASE in the last 168 hours. No results for input(s): AMMONIA in the last 168 hours. CBC:  Recent Labs Lab 11/06/14 1952 11/07/14 0628 11/08/14 0610  WBC 7.6 5.4 5.5  NEUTROABS 5.5  --   --   HGB 12.5* 11.8* 11.7*  HCT 37.5* 35.6* 35.3*  MCV 88.2 86.8 86.7  PLT 261 234 233   Cardiac Enzymes: No results for input(s): CKTOTAL, CKMB, CKMBINDEX, TROPONINI in the last 168 hours. BNP: Invalid input(s): POCBNP CBG: No results for input(s): GLUCAP in the last 168 hours.  Recent Results (from the past 240 hour(s))  Wound culture     Status: None   Collection Time: 11/07/14  1:05 AM  Result Value Ref Range Status   Specimen Description WOUND FACE  Final   Special Requests NONE  Final   Gram Stain   Final    FEW WBC PRESENT, PREDOMINANTLY PMN NO SQUAMOUS  EPITHELIAL CELLS SEEN RARE GRAM POSITIVE COCCI IN PAIRS Performed at [facility]    Culture   Final    MULTIPLE ORGANISMS PRESENT, NONE PREDOMINANT Note: NO STAPHYLOCOCCUS AUREUS ISOLATED NO GROUP A STREP (S.PYOGENES) ISOLATED Performed at [facility]    Report Status 11/09/2014 FINAL  Final     Scheduled Meds: . allopurinol  300 mg Oral Daily  . aspirin  325 mg Oral Daily  . atorvastatin  20 mg Oral q1800  . aztreonam  2 g Intravenous Q8H  . cholecalciferol  800 Units Oral Daily  . citalopram  20 mg Oral Daily  . clindamycin (CLEOCIN) IV  900 mg Intravenous 3 times per day  . enoxaparin (LOVENOX) injection  60 mg Subcutaneous Q24H  . niacin  1,000 mg Oral QHS  . omega-3 acid ethyl esters  1 g Oral BID  . sodium chloride  2,000 mL Intravenous Once  . varenicline  1 mg Oral BID  . verapamil  240 mg Oral Daily   Continuous Infusions: . dextrose 5 % and 0.45% NaCl 100 mL/hr at 11/08/14 2331     [place] Nordquist, DO  [facility] Pager [phone]  If 7PM-7AM, please contact night-coverage [email or web address] Password [email or web address] 11/09/2014, 3:59 PM   LOS: 2 days

## 2014-11-10 DIAGNOSIS — E785 Hyperlipidemia, unspecified: Secondary | ICD-10-CM

## 2014-11-10 LAB — BASIC METABOLIC PANEL
Anion gap: 10 (ref 5–15)
BUN: 5 mg/dL — AB (ref 6–20)
CALCIUM: 8.8 mg/dL — AB (ref 8.9–10.3)
CO2: 26 mmol/L (ref 22–32)
CREATININE: 0.93 mg/dL (ref 0.61–1.24)
Chloride: 101 mmol/L (ref 101–111)
GFR calc Af Amer: >60 mL/min (ref >60)
GLUCOSE: 115 mg/dL — AB (ref 65–99)
POTASSIUM: 3.4 mmol/L — AB (ref 3.5–5.1)
SODIUM: 137 mmol/L (ref 135–145)

## 2014-11-10 LAB — MAGNESIUM: MAGNESIUM: 1.6 mg/dL — AB (ref 1.7–2.4)

## 2014-11-10 MED ORDER — ATORVASTATIN CALCIUM 20 MG PO TABS: 20.0000 mg | ORAL_TABLET | Freq: Every day | ORAL | Status: DC

## 2014-11-10 MED ORDER — CLINDAMYCIN HCL 150 MG PO CAPS: 450.0000 mg | ORAL_CAPSULE | Freq: Three times a day (TID) | ORAL | Status: DC

## 2014-11-10 MED ORDER — POTASSIUM CHLORIDE CRYS ER 20 MEQ PO TBCR
20.0000 meq | EXTENDED_RELEASE_TABLET | Freq: Once | ORAL | Status: AC
  Administered 2014-11-10: 20 meq via ORAL
  Filled 2014-11-10: qty 1

## 2014-11-10 MED ORDER — MAGNESIUM SULFATE 2 GM/50ML IV SOLN
2.0000 g | Freq: Once | INTRAVENOUS | Status: AC
  Administered 2014-11-10: 2 g via INTRAVENOUS
  Filled 2014-11-10: qty 50

## 2014-11-10 NOTE — Discharge Summary (Signed)
Physician Discharge Summary  Marcus Carr CVE:[id number] DOB: 1940/07/28 DOA: 11/06/2014  PCP: Sharion Balloon, FNP  Admit date: 11/06/2014 Discharge date: 11/10/2014  Recommendations for Outpatient Follow-up:  1. Pt will need to follow up with PCP in 2 weeks post discharge 2. Please obtain BMP and CBC in 1-2 weeks  Discharge Diagnoses:  Mandibular osteomyelitis -appreciate ID recommendations -11/06/2014 CT neck showed mandibular osteomyelitis with thin abscess on the outer right mandibular body. -I have consulted oral Carr, Dr. Eulah Citizen input--ultimately deferred any further surgical intervention to the patient's outpatient oral surgeons. -order Panorex-no bone lesion, no dental abscess -continue IV abx--patient received clindamycin and aztreonam during the hospitalization -He will be discharged with clindamycin 450 milligrams every 8 hours for 6 weeks -Consulted [facility]--Dr. Elie Goody saw the pt--> after reviewing the patient's CT, he did not feel that there was any persistent drainable abscess as the patient had clinically improved with decreasing fluctuance and edema on intravenous antibiotics  -The patient was instructed to follow up with his oral surgeon Dr. Consuella Lose or Dr. Olena Heckle within one week after d/c -pt presently wanted to go home with oupt followup with his oral surgeon.  He did not want transfer to [facility] -At the time of discharge, the patient was able to chew and masticate without significant pain.  hypertension  -Continue verapamil  -Hold HCTZ-->restart after d/c Hyperlipidemia  -Continue Lipitor, niacin, Lovaza -due to interaction with verapamil, simvastatin was discontinued -home with atorvastatin 20 mg daily Tobacco abuse  -continue Chantix -tobacco cessation discussed Anxiety  -Continue Celexa  Chronic mastoiditis  CKD stage III  -Baseline creatinine 1.0-1.2   Discharge Condition: stable  Disposition:  home Follow-up Information    Call [facility], DDS.   Specialty:  Oral Carr   Why:  Follow up evaluation   Contact information:   [address] [place] [address] [phone]       Diet:soft Wt Readings from Last 3 Encounters:  11/06/14 106.142 kg (234 lb)  10/30/14 106.142 kg (234 lb)  10/26/14 106.505 kg (234 lb 12.8 oz)    History of present illness:  74 year old male with a history of osteomyelitis of his right jaw for which he had been following [facility], Dr. Consuella Lose. The patient states that he recently finished a course of oral antibiotics. However for the past 2-3 weeks he has noted increasing drainage from the right side of his and able. Approximately 2 days prior to admission he noticed a new opening closer to the midline of his jaw and he had increasing pain. He denied any fevers, chills, chest pain, shortness breath. He was treated for right maxillary sinusitis and possible tooth abscess in April 2016. CT of his neck revealed mandibular osteomyelitis with 2 cm thin abscess and 1 cm subperiosteal abscess on the right mandibular body. After admission, the patient was started on clindamycin and asked to name. CT of the neck confirmed mandibular osteomyelitis with order right mandibular abscess. His wound continued to drain spontaneously. He was seen by dental medicine Dr. Enrique Sack who felt the patient was receiving appropriate treatment and need to follow up with oral Carr, Dr. Lewanda Rife. He was also seen by infectious disease who recommended 6 weeks of outpatient clindamycin. He was also seen by ENT, Dr. Constance Holster who did not feel that the patient had any further drainable abscess. The patient improved clinically. He will be discharged in stable condition.  Consultants: Dr. Enrique Sack Dr. Megan Salon Dr. Garret Reddish  Rosen  Discharge Exam: Filed Vitals:   11/10/14 0632  BP: 142/71  Pulse: 68  Temp: 98 F (36.7 C)  Resp: 18   Filed  Vitals:   11/09/14 0600 11/09/14 1400 11/09/14 2022 11/10/14 0632  BP: 145/80 124/67 145/81 142/71  Pulse: 70 68 60 68  Temp: 97.9 F (36.6 C) 98.1 F (36.7 C) 97.7 F (36.5 C) 98 F (36.7 C)  TempSrc: Oral     Resp: 18 18 18 18   Height:      Weight:      SpO2: 97% 97% 97% 97%   General: A&O x 3, NAD, pleasant, cooperative Cardiovascular: RRR, no rub, no gallop, no S3; right jaw without any crepitance, lymphangitis, necrosis. Respiratory: CTAB, no wheeze, no rhonchi Abdomen:soft, nontender, nondistended, positive bowel sounds Extremities: No edema, No lymphangitis, no petechiae  Discharge Instructions      Discharge Instructions    Diet - low sodium heart healthy    Complete by:  As directed      Increase activity slowly    Complete by:  As directed             Medication List    STOP taking these medications        simvastatin 40 MG tablet  Commonly known as:  ZOCOR     sulfamethoxazole-trimethoprim 800-160 MG per tablet  Commonly known as:  BACTRIM DS,SEPTRA DS      TAKE these medications        allopurinol 300 MG tablet  Commonly known as:  ZYLOPRIM  Take 1 tablet (300 mg total) by mouth daily.     aspirin 325 MG tablet  Take 325 mg by mouth daily.     atorvastatin 20 MG tablet  Commonly known as:  LIPITOR  Take 1 tablet (20 mg total) by mouth daily at 6 PM.     cholecalciferol 400 UNITS Tabs tablet  Commonly known as:  VITAMIN D  Take 800 Units by mouth daily.     citalopram 20 MG tablet  Commonly known as:  CELEXA  TAKE 1 TABLET DAILY     clindamycin 150 MG capsule  Commonly known as:  CLEOCIN  Take 3 capsules (450 mg total) by mouth 3 (three) times daily. X 6 weeks     fish oil-omega-3 fatty acids 1000 MG capsule  Take 1 g by mouth 2 (two) times daily.     hydrochlorothiazide 12.5 MG tablet  Commonly known as:  HYDRODIURIL  Take 1 tablet (12.5 mg total) by mouth daily.     niacin 1000 MG CR tablet  Commonly known as:  NIASPAN  TAKE  1 TABLET (1,000 MG TOTAL) BY MOUTH AT BEDTIME.     oxyCODONE-acetaminophen 10-325 MG per tablet  Commonly known as:  PERCOCET  Take 1 tablet by mouth every 12 (twelve) hours as needed.     varenicline 1 MG tablet  Commonly known as:  CHANTIX CONTINUING MONTH PAK  Take 1 tablet (1 mg total) by mouth 2 (two) times daily.     verapamil 240 MG 24 hr capsule  Commonly known as:  VERELAN PM  One po bid         The results of significant diagnostics from this hospitalization (including imaging, microbiology, ancillary and laboratory) are listed below for reference.    Significant Diagnostic Studies: Dg Mandible 4 Views  11/06/2014   CLINICAL DATA:  Pt c/o jaw pain x 2 months. no known injury, Sts approx 2 weeks ago he  had a large lump on the middle of his chin (bottom) which opened and began draining. Sts a two days ago he had a second opening on the right part of his chin open. Yellow drainage noted from both areas with some swelling and redness.  EXAM: MANDIBLE - 4+ VIEW  COMPARISON:  None.  FINDINGS: No fracture.  Patient is mostly edentulous. There are 3 remaining anterior mandibular teeth which appear narrowed. These are consistent with the incisors and left lateral incisor. There is also an unerupted left third molar. There is no evidence of a root abscess. There is no mandibular lesion.  IMPRESSION: 1. No fracture.  No bone lesion. 2. Patient mostly edentulous.  No evidence of a dental abscess.   Electronically Signed   By: Lajean Manes M.D.   On: 11/06/2014 22:43   Dg Chest 2 View  10/27/2014   CLINICAL DATA:  Preoperative clearance  EXAM: CHEST  2 VIEW  COMPARISON:  None in PACs  FINDINGS: The lungs are adequately inflated and clear. The heart and pulmonary vascularity are normal. The mediastinum is normal in width. There is tortuosity of the descending thoracic aorta. The bony thorax is unremarkable.  IMPRESSION: There is no active cardiopulmonary disease.   Electronically Signed   By:  Lanitra Battaglini  [place] M.D.   On: 10/27/2014 08:00   Ct Soft Tissue Neck W Contrast  11/07/2014   CLINICAL DATA:  Lower chin abscess for several weeks with drainage.  EXAM: CT NECK WITH CONTRAST  TECHNIQUE: Multidetector CT imaging of the neck was performed using the standard protocol following the bolus administration of intravenous contrast.  CONTRAST:  52mL OMNIPAQUE IOHEXOL 300 MG/ML  SOLN  COMPARISON:  None.  FINDINGS: Pharynx and larynx: No evidence of mass lesion.  Salivary glands: Unremarkable  Thyroid: 3 cm (craniocaudal) solitary nodule in the left thyroid gland. No invasive features or regional adenopathy.  Lymph nodes: Mild reactive enlargement of submental and submandibular lymph nodes. No necrotic appearing nodes.  Vascular: Cervical carotid atherosclerosis without notable stenosis. No venous thrombosis.  Limited intracranial: Negative  Visualized orbits: Negative  Mastoids and visualized paranasal sinuses: Bilateral mastoid opacification, greater on the left with under pneumatization and sclerosis.  Skeleton: There is mottled lucency within the mandibular symphysis and anterior right body with chronic periosteal reaction and fragmentation (a band of lucency across the diseased bone has the appearance of fracture, but is incomplete). There is contiguous thickening of regional soft tissues with a subcutaneous fluid collection in the right chin measuring 2 cm and consistent with abscess. There is also a 1cm rim enhancing collection around the buccal surface of the right mandible seen on image 42. The alveolar ridge is atrophic in the setting of nearly complete edentulous state (3 remaining mandibular incisors are present and carious) and there is mucosal thinning which could exposed bone. No mucosal based mass is seen. There is no reported history of head neck cancer or radiation therapy. No bisphosphonates on the patient's medication list.  High-grade right C3-4 foraminal stenosis from uncovertebral spurring.   Upper chest: Clear apical lungs.  Acute findings were called by telephone at the time of interpretation on 11/07/2014 at 2:04 am to Dr. Charlann Lange , who verbally acknowledged these results.  IMPRESSION: 1. Mandibular osteomyelitis with 2 cm subcutaneous chin abscess and 1 cm subperiosteal abscess on the outer right mandible body. 2. 3 cm solitary thyroid nodule on the left. Recommend outpatient sonography. 3. Chronic bilateral mastoiditis.   Electronically Signed   By:  Monte Fantasia M.D.   On: 11/07/2014 02:10     Microbiology: Recent Results (from the past 240 hour(s))  Wound culture     Status: None   Collection Time: 11/07/14  1:05 AM  Result Value Ref Range Status   Specimen Description WOUND FACE  Final   Special Requests NONE  Final   Gram Stain   Final    FEW WBC PRESENT, PREDOMINANTLY PMN NO SQUAMOUS EPITHELIAL CELLS SEEN RARE GRAM POSITIVE COCCI IN PAIRS Performed at [facility]    Culture   Final    MULTIPLE ORGANISMS PRESENT, NONE PREDOMINANT Note: NO STAPHYLOCOCCUS AUREUS ISOLATED NO GROUP A STREP (S.PYOGENES) ISOLATED Performed at [facility]    Report Status 11/09/2014 FINAL  Final     Labs: Basic Metabolic Panel:  Recent Labs Lab 11/06/14 1952 11/07/14 0628 11/08/14 0610 11/10/14 0415  NA 135 133* 135 137  K 4.3 3.6 3.4* 3.4*  CL 96* 97* 101 101  CO2 28 27 27 26   GLUCOSE 102* 106* 90 115*  BUN 16 11 7  5*  CREATININE 1.25* 0.95 0.85 0.93  CALCIUM 9.6 8.6* 8.6* 8.8*  MG  --   --   --  1.6*   Liver Function Tests:  Recent Labs Lab 11/06/14 1952  AST 29  ALT 26  ALKPHOS 73  BILITOT 0.3  PROT 7.1  ALBUMIN 3.2*   No results for input(s): LIPASE, AMYLASE in the last 168 hours. No results for input(s): AMMONIA in the last 168 hours. CBC:  Recent Labs Lab 11/06/14 1952 11/07/14 0628 11/08/14 0610  WBC 7.6 5.4 5.5  NEUTROABS 5.5  --   --   HGB 12.5* 11.8* 11.7*  HCT 37.5* 35.6* 35.3*  MCV 88.2 86.8 86.7  PLT 261 234  233   Cardiac Enzymes: No results for input(s): CKTOTAL, CKMB, CKMBINDEX, TROPONINI in the last 168 hours. BNP: Invalid input(s): POCBNP CBG: No results for input(s): GLUCAP in the last 168 hours.  Time coordinating discharge:  Greater than 30 minutes  Signed:  Felipa Laroche, DO [facility] Pager: [phone] 11/10/2014, 9:04 AM

## 2014-11-13 ENCOUNTER — Ambulatory Visit (HOSPITAL_COMMUNITY)
Admit: 2014-11-13 | Discharge: 2014-11-13 | Disposition: A | Discharge status: Home / Self Care | Payer: Medicare Other | Attending: Surgery | Admitting: Surgery

## 2014-11-13 ENCOUNTER — Other Ambulatory Visit: Payer: Self-pay | Admitting: Surgery

## 2014-11-13 DIAGNOSIS — M869 Osteomyelitis, unspecified: Secondary | ICD-10-CM

## 2014-11-15 ENCOUNTER — Ambulatory Visit (HOSPITAL_COMMUNITY)
Admission: RE | Admit: 2014-11-15 | Discharge: 2014-11-15 | Disposition: A | Discharge status: Home / Self Care | Payer: Medicare Other | Source: Ambulatory Visit | Attending: Surgery | Admitting: Surgery

## 2014-11-15 ENCOUNTER — Other Ambulatory Visit: Payer: Self-pay | Admitting: Surgery

## 2014-11-15 DIAGNOSIS — M272 Inflammatory conditions of jaws: Secondary | ICD-10-CM | POA: Diagnosis not present

## 2014-11-15 DIAGNOSIS — R6884 Jaw pain: Secondary | ICD-10-CM | POA: Diagnosis present

## 2014-11-15 DIAGNOSIS — M869 Osteomyelitis, unspecified: Secondary | ICD-10-CM

## 2014-11-16 ENCOUNTER — Other Ambulatory Visit: Payer: Self-pay

## 2014-11-16 MED ORDER — NIACIN ER (ANTIHYPERLIPIDEMIC) 1000 MG PO TBCR: EXTENDED_RELEASE_TABLET | ORAL | Status: DC

## 2014-11-23 ENCOUNTER — Other Ambulatory Visit: Payer: Self-pay | Admitting: *Deleted

## 2014-11-23 DIAGNOSIS — I10 Essential (primary) hypertension: Secondary | ICD-10-CM

## 2014-11-23 MED ORDER — HYDROCHLOROTHIAZIDE 12.5 MG PO TABS: 12.5000 mg | ORAL_TABLET | Freq: Every day | ORAL | Status: DC

## 2014-11-30 ENCOUNTER — Encounter (HOSPITAL_BASED_OUTPATIENT_CLINIC_OR_DEPARTMENT_OTHER): Payer: Medicare Other | Attending: Internal Medicine

## 2014-11-30 DIAGNOSIS — I10 Essential (primary) hypertension: Secondary | ICD-10-CM | POA: Diagnosis not present

## 2014-11-30 DIAGNOSIS — M109 Gout, unspecified: Secondary | ICD-10-CM | POA: Insufficient documentation

## 2014-11-30 DIAGNOSIS — F17210 Nicotine dependence, cigarettes, uncomplicated: Secondary | ICD-10-CM | POA: Insufficient documentation

## 2014-11-30 DIAGNOSIS — M272 Inflammatory conditions of jaws: Secondary | ICD-10-CM | POA: Insufficient documentation

## 2014-11-30 DIAGNOSIS — M879 Osteonecrosis, unspecified: Secondary | ICD-10-CM | POA: Diagnosis not present

## 2014-12-04 DIAGNOSIS — M272 Inflammatory conditions of jaws: Secondary | ICD-10-CM | POA: Diagnosis not present

## 2014-12-04 DIAGNOSIS — I10 Essential (primary) hypertension: Secondary | ICD-10-CM | POA: Diagnosis not present

## 2014-12-04 DIAGNOSIS — F17210 Nicotine dependence, cigarettes, uncomplicated: Secondary | ICD-10-CM | POA: Diagnosis not present

## 2014-12-04 DIAGNOSIS — M109 Gout, unspecified: Secondary | ICD-10-CM | POA: Diagnosis not present

## 2014-12-04 DIAGNOSIS — M879 Osteonecrosis, unspecified: Secondary | ICD-10-CM | POA: Diagnosis not present

## 2014-12-05 DIAGNOSIS — I10 Essential (primary) hypertension: Secondary | ICD-10-CM | POA: Diagnosis not present

## 2014-12-05 DIAGNOSIS — F17210 Nicotine dependence, cigarettes, uncomplicated: Secondary | ICD-10-CM | POA: Diagnosis not present

## 2014-12-05 DIAGNOSIS — M272 Inflammatory conditions of jaws: Secondary | ICD-10-CM | POA: Diagnosis not present

## 2014-12-05 DIAGNOSIS — M879 Osteonecrosis, unspecified: Secondary | ICD-10-CM | POA: Diagnosis not present

## 2014-12-05 DIAGNOSIS — M109 Gout, unspecified: Secondary | ICD-10-CM | POA: Diagnosis not present

## 2014-12-06 ENCOUNTER — Ambulatory Visit: Payer: Medicare Other | Admitting: Family Medicine

## 2014-12-08 ENCOUNTER — Ambulatory Visit (INDEPENDENT_AMBULATORY_CARE_PROVIDER_SITE_OTHER): Payer: Medicare Other | Admitting: Family

## 2014-12-08 ENCOUNTER — Encounter: Payer: Self-pay | Admitting: Family

## 2014-12-08 VITALS — BP 116/75 | HR 77 | Temp 96.6°F | Ht 67.5 in | Wt 219.6 lb

## 2014-12-08 DIAGNOSIS — R55 Syncope and collapse: Secondary | ICD-10-CM | POA: Diagnosis not present

## 2014-12-08 NOTE — Patient Instructions (Signed)
Syncope °Syncope is a medical term for fainting or passing out. This means you lose consciousness and drop to the ground. People are generally unconscious for less than 5 minutes. You may have some muscle twitches for up to 15 seconds before waking up and returning to normal. Syncope occurs more often in older adults, but it can happen to anyone. While most causes of syncope are not dangerous, syncope can be a sign of a serious medical problem. It is important to seek medical care.  °CAUSES  °Syncope is caused by a sudden drop in blood flow to the brain. The specific cause is often not determined. Factors that can bring on syncope include: °· Taking medicines that lower blood pressure. °· Sudden changes in posture, such as standing up quickly. °· Taking more medicine than prescribed. °· Standing in one place for too long. °· Seizure disorders. °· Dehydration and excessive exposure to heat. °· Low blood sugar (hypoglycemia). °· Straining to have a bowel movement. °· Heart disease, irregular heartbeat, or other circulatory problems. °· Fear, emotional distress, seeing blood, or severe pain. °SYMPTOMS  °Right before fainting, you may: °· Feel dizzy or light-headed. °· Feel nauseous. °· See all white or all black in your field of vision. °· Have cold, clammy skin. °DIAGNOSIS  °Your health care provider will ask about your symptoms, perform a physical exam, and perform an electrocardiogram (ECG) to record the electrical activity of your heart. Your health care provider may also perform other heart or blood tests to determine the cause of your syncope which may include: °· Transthoracic echocardiogram (TTE). During echocardiography, sound waves are used to evaluate how blood flows through your heart. °· Transesophageal echocardiogram (TEE). °· Cardiac monitoring. This allows your health care provider to monitor your heart rate and rhythm in real time. °· Holter monitor. This is a portable device that records your  heartbeat and can help diagnose heart arrhythmias. It allows your health care provider to track your heart activity for several days, if needed. °· Stress tests by exercise or by giving medicine that makes the heart beat faster. °TREATMENT  °In most cases, no treatment is needed. Depending on the cause of your syncope, your health care provider may recommend changing or stopping some of your medicines. °HOME CARE INSTRUCTIONS °· Have someone stay with you until you feel stable. °· Do not drive, use machinery, or play sports until your health care provider says it is okay. °· Keep all follow-up appointments as directed by your health care provider. °· Lie down right away if you start feeling like you might faint. Breathe deeply and steadily. Wait until all the symptoms have passed. °· Drink enough fluids to keep your urine clear or pale yellow. °· If you are taking blood pressure or heart medicine, get up slowly and take several minutes to sit and then stand. This can reduce dizziness. °SEEK IMMEDIATE MEDICAL CARE IF:  °· You have a severe headache. °· You have unusual pain in the chest, abdomen, or back. °· You are bleeding from your mouth or rectum, or you have black or tarry stool. °· You have an irregular or very fast heartbeat. °· You have pain with breathing. °· You have repeated fainting or seizure-like jerking during an episode. °· You faint when sitting or lying down. °· You have confusion. °· You have trouble walking. °· You have severe weakness. °· You have vision problems. °If you fainted, call your local emergency services (911 in [place] Do not drive   yourself to the hospital.  °MAKE SURE YOU: °· Understand these instructions. °· Will watch your condition. °· Will get help right away if you are not doing well or get worse. °Document Released: 03/10/2005 Document Revised: 03/15/2013 Document Reviewed: 05/09/2011 °ExitCare® Patient Information ©2015 [employer]. This information is not intended to replace  advice given to you by your health care provider. Make sure you discuss any questions you have with your health care provider. ° °

## 2014-12-08 NOTE — Progress Notes (Signed)
   Subjective:    Patient ID: Marcus Carr, male    DOB: 1940-04-05, 74 y.o.   MRN: [id number]  PT presents to the office today for a syncope episode. Pt states on Tuesday he got out of his car and was walking into his house and "fainted". Pt states he fell and hit the coffee table on her forehead. Pt denies any double vision, gait problems, slurred speech, dizziness, or weakness. Pt states at times when he "stands up too fast" he gets "light headed". Pt is currently being treated for ostiomeylitis with clindamycin TID and hyperbaric oxygen therapy 5 times a week. Loss of Consciousness This is a new problem. The current episode started in the past 7 days (Tuesday). There was no loss of consciousness. The symptoms are aggravated by standing. Pertinent negatives include no abdominal pain, back pain, bladder incontinence, dizziness, fever, vertigo, vomiting or weakness. He has tried nothing for the symptoms. The treatment provided no relief.      Review of Systems  Constitutional: Negative.  Negative for fever.  HENT: Negative.   Respiratory: Negative.   Cardiovascular: Positive for syncope.  Gastrointestinal: Negative.  Negative for vomiting and abdominal pain.  Endocrine: Negative.   Genitourinary: Negative.  Negative for bladder incontinence.  Musculoskeletal: Negative.  Negative for back pain.  Neurological: Negative for dizziness, vertigo and weakness.  Hematological: Negative.   Psychiatric/Behavioral: Negative.   All other systems reviewed and are negative.      Objective:   Physical Exam  Constitutional: He is oriented to person, place, and time. He appears well-developed and well-nourished. No distress.  Eyes: Pupils are equal, round, and reactive to light. Right eye exhibits no discharge. Left eye exhibits no discharge.  Cardiovascular: Normal rate, regular rhythm, normal heart sounds and intact distal pulses.   No murmur heard. Pulmonary/Chest: Effort normal and breath  sounds normal. No respiratory distress. He has no wheezes.  Abdominal: Soft. Bowel sounds are normal. He exhibits no distension. There is no tenderness.  Musculoskeletal: Normal range of motion. He exhibits no edema or tenderness.  Neurological: He is alert and oriented to person, place, and time. He has normal reflexes. No cranial nerve deficit.  Skin: Skin is warm and dry. No rash noted. No erythema.  Psychiatric: He has a normal mood and affect. His behavior is normal. Judgment and thought content normal.  Vitals reviewed.     BP 116/75 mmHg  Pulse 77  Temp(Src) 96.6 F (35.9 C) (Oral)  Ht 5' 7.5" (1.715 m)  Wt 219 lb 9.6 oz (99.61 kg)  BMI 33.87 kg/m2     Assessment & Plan:  1. Syncope, unspecified syncope type -PT to stop HCTZ 12.5 mg -When pt stands he is to count to 5 before moving -Falls precaution discussed -Continue with antibiotic and hyperbaric oxygen therapy for the osteomyelitis   -RTO prn - CBC with Differential/Platelet - CMP14+EGFR  [name], FNP

## 2014-12-09 LAB — CBC WITH DIFFERENTIAL/PLATELET
BASOS ABS: 0 10*3/uL (ref 0.0–0.2)
Basos: 0 %
EOS (ABSOLUTE): 0.2 10*3/uL (ref 0.0–0.4)
Eos: 2 %
Hematocrit: 36 % — ABNORMAL LOW (ref 37.5–51.0)
Hemoglobin: 12.4 g/dL — ABNORMAL LOW (ref 12.6–17.7)
Immature Grans (Abs): 0 10*3/uL (ref 0.0–0.1)
Immature Granulocytes: 0 %
LYMPHS ABS: 1.2 10*3/uL (ref 0.7–3.1)
LYMPHS: 13 %
MCH: 29 pg (ref 26.6–33.0)
MCHC: 34.4 g/dL (ref 31.5–35.7)
MCV: 84 fL (ref 79–97)
MONOS ABS: 0.8 10*3/uL (ref 0.1–0.9)
Monocytes: 9 %
NEUTROS ABS: 6.8 10*3/uL (ref 1.4–7.0)
Neutrophils: 76 %
PLATELETS: 267 10*3/uL (ref 150–379)
RBC: 4.27 x10E6/uL (ref 4.14–5.80)
RDW: 15.1 % (ref 12.3–15.4)
WBC: 9 10*3/uL (ref 3.4–10.8)

## 2014-12-09 LAB — CMP14+EGFR
A/G RATIO: 1.2 (ref 1.1–2.5)
ALK PHOS: 82 IU/L (ref 39–117)
ALT: 13 IU/L (ref 0–44)
AST: 13 IU/L (ref 0–40)
Albumin: 4.1 g/dL (ref 3.5–4.8)
BILIRUBIN TOTAL: 0.4 mg/dL (ref 0.0–1.2)
BUN/Creatinine Ratio: 14 (ref 10–22)
BUN: 15 mg/dL (ref 8–27)
CHLORIDE: 91 mmol/L — AB (ref 97–108)
CO2: 25 mmol/L (ref 18–29)
Calcium: 9.4 mg/dL (ref 8.6–10.2)
Creatinine, Ser: 1.11 mg/dL (ref 0.76–1.27)
GFR calc Af Amer: 76 mL/min/{1.73_m2} (ref >59)
GFR calc non Af Amer: 66 mL/min/{1.73_m2} (ref >59)
GLUCOSE: 133 mg/dL — AB (ref 65–99)
Globulin, Total: 3.3 g/dL (ref 1.5–4.5)
POTASSIUM: 3.6 mmol/L (ref 3.5–5.2)
Sodium: 136 mmol/L (ref 134–144)
Total Protein: 7.4 g/dL (ref 6.0–8.5)

## 2014-12-11 ENCOUNTER — Encounter: Payer: Self-pay | Admitting: *Deleted

## 2014-12-11 DIAGNOSIS — M5441 Lumbago with sciatica, right side: Secondary | ICD-10-CM | POA: Diagnosis not present

## 2014-12-11 DIAGNOSIS — G894 Chronic pain syndrome: Secondary | ICD-10-CM | POA: Diagnosis not present

## 2014-12-11 DIAGNOSIS — F17210 Nicotine dependence, cigarettes, uncomplicated: Secondary | ICD-10-CM | POA: Diagnosis not present

## 2014-12-11 DIAGNOSIS — I10 Essential (primary) hypertension: Secondary | ICD-10-CM | POA: Diagnosis not present

## 2014-12-11 DIAGNOSIS — M109 Gout, unspecified: Secondary | ICD-10-CM | POA: Diagnosis not present

## 2014-12-11 DIAGNOSIS — M272 Inflammatory conditions of jaws: Secondary | ICD-10-CM | POA: Diagnosis not present

## 2014-12-11 DIAGNOSIS — M5136 Other intervertebral disc degeneration, lumbar region: Secondary | ICD-10-CM | POA: Diagnosis not present

## 2014-12-11 DIAGNOSIS — Z79891 Long term (current) use of opiate analgesic: Secondary | ICD-10-CM | POA: Diagnosis not present

## 2014-12-11 DIAGNOSIS — M879 Osteonecrosis, unspecified: Secondary | ICD-10-CM | POA: Diagnosis not present

## 2014-12-12 DIAGNOSIS — I10 Essential (primary) hypertension: Secondary | ICD-10-CM | POA: Diagnosis not present

## 2014-12-12 DIAGNOSIS — F17210 Nicotine dependence, cigarettes, uncomplicated: Secondary | ICD-10-CM | POA: Diagnosis not present

## 2014-12-12 DIAGNOSIS — M109 Gout, unspecified: Secondary | ICD-10-CM | POA: Diagnosis not present

## 2014-12-12 DIAGNOSIS — M272 Inflammatory conditions of jaws: Secondary | ICD-10-CM | POA: Diagnosis not present

## 2014-12-12 DIAGNOSIS — M879 Osteonecrosis, unspecified: Secondary | ICD-10-CM | POA: Diagnosis not present

## 2014-12-13 DIAGNOSIS — M109 Gout, unspecified: Secondary | ICD-10-CM | POA: Diagnosis not present

## 2014-12-13 DIAGNOSIS — M8668 Other chronic osteomyelitis, other site: Secondary | ICD-10-CM | POA: Diagnosis not present

## 2014-12-13 DIAGNOSIS — I10 Essential (primary) hypertension: Secondary | ICD-10-CM | POA: Diagnosis not present

## 2014-12-13 DIAGNOSIS — M272 Inflammatory conditions of jaws: Secondary | ICD-10-CM | POA: Diagnosis not present

## 2014-12-13 DIAGNOSIS — F17210 Nicotine dependence, cigarettes, uncomplicated: Secondary | ICD-10-CM | POA: Diagnosis not present

## 2014-12-13 DIAGNOSIS — M8648 Chronic osteomyelitis with draining sinus, other site: Secondary | ICD-10-CM | POA: Diagnosis not present

## 2014-12-13 DIAGNOSIS — M879 Osteonecrosis, unspecified: Secondary | ICD-10-CM | POA: Diagnosis not present

## 2014-12-14 DIAGNOSIS — M272 Inflammatory conditions of jaws: Secondary | ICD-10-CM | POA: Diagnosis not present

## 2014-12-14 DIAGNOSIS — M109 Gout, unspecified: Secondary | ICD-10-CM | POA: Diagnosis not present

## 2014-12-14 DIAGNOSIS — M879 Osteonecrosis, unspecified: Secondary | ICD-10-CM | POA: Diagnosis not present

## 2014-12-14 DIAGNOSIS — I10 Essential (primary) hypertension: Secondary | ICD-10-CM | POA: Diagnosis not present

## 2014-12-14 DIAGNOSIS — F17210 Nicotine dependence, cigarettes, uncomplicated: Secondary | ICD-10-CM | POA: Diagnosis not present

## 2014-12-15 DIAGNOSIS — F17210 Nicotine dependence, cigarettes, uncomplicated: Secondary | ICD-10-CM | POA: Diagnosis not present

## 2014-12-15 DIAGNOSIS — M879 Osteonecrosis, unspecified: Secondary | ICD-10-CM | POA: Diagnosis not present

## 2014-12-15 DIAGNOSIS — M272 Inflammatory conditions of jaws: Secondary | ICD-10-CM | POA: Diagnosis not present

## 2014-12-15 DIAGNOSIS — M109 Gout, unspecified: Secondary | ICD-10-CM | POA: Diagnosis not present

## 2014-12-15 DIAGNOSIS — I10 Essential (primary) hypertension: Secondary | ICD-10-CM | POA: Diagnosis not present

## 2014-12-18 DIAGNOSIS — F17210 Nicotine dependence, cigarettes, uncomplicated: Secondary | ICD-10-CM | POA: Diagnosis not present

## 2014-12-18 DIAGNOSIS — M879 Osteonecrosis, unspecified: Secondary | ICD-10-CM | POA: Diagnosis not present

## 2014-12-18 DIAGNOSIS — I10 Essential (primary) hypertension: Secondary | ICD-10-CM | POA: Diagnosis not present

## 2014-12-18 DIAGNOSIS — M272 Inflammatory conditions of jaws: Secondary | ICD-10-CM | POA: Diagnosis not present

## 2014-12-18 DIAGNOSIS — M109 Gout, unspecified: Secondary | ICD-10-CM | POA: Diagnosis not present

## 2014-12-19 DIAGNOSIS — M272 Inflammatory conditions of jaws: Secondary | ICD-10-CM | POA: Diagnosis not present

## 2014-12-19 DIAGNOSIS — F17210 Nicotine dependence, cigarettes, uncomplicated: Secondary | ICD-10-CM | POA: Diagnosis not present

## 2014-12-19 DIAGNOSIS — I10 Essential (primary) hypertension: Secondary | ICD-10-CM | POA: Diagnosis not present

## 2014-12-19 DIAGNOSIS — M109 Gout, unspecified: Secondary | ICD-10-CM | POA: Diagnosis not present

## 2014-12-19 DIAGNOSIS — M879 Osteonecrosis, unspecified: Secondary | ICD-10-CM | POA: Diagnosis not present

## 2014-12-20 DIAGNOSIS — M868X8 Other osteomyelitis, other site: Secondary | ICD-10-CM | POA: Diagnosis not present

## 2014-12-20 DIAGNOSIS — F17210 Nicotine dependence, cigarettes, uncomplicated: Secondary | ICD-10-CM | POA: Diagnosis not present

## 2014-12-20 DIAGNOSIS — M879 Osteonecrosis, unspecified: Secondary | ICD-10-CM | POA: Diagnosis not present

## 2014-12-20 DIAGNOSIS — M109 Gout, unspecified: Secondary | ICD-10-CM | POA: Diagnosis not present

## 2014-12-20 DIAGNOSIS — M272 Inflammatory conditions of jaws: Secondary | ICD-10-CM | POA: Diagnosis not present

## 2014-12-20 DIAGNOSIS — I10 Essential (primary) hypertension: Secondary | ICD-10-CM | POA: Diagnosis not present

## 2014-12-21 DIAGNOSIS — M272 Inflammatory conditions of jaws: Secondary | ICD-10-CM | POA: Diagnosis not present

## 2014-12-21 DIAGNOSIS — M109 Gout, unspecified: Secondary | ICD-10-CM | POA: Diagnosis not present

## 2014-12-21 DIAGNOSIS — I10 Essential (primary) hypertension: Secondary | ICD-10-CM | POA: Diagnosis not present

## 2014-12-21 DIAGNOSIS — F17210 Nicotine dependence, cigarettes, uncomplicated: Secondary | ICD-10-CM | POA: Diagnosis not present

## 2014-12-21 DIAGNOSIS — M879 Osteonecrosis, unspecified: Secondary | ICD-10-CM | POA: Diagnosis not present

## 2014-12-22 DIAGNOSIS — M109 Gout, unspecified: Secondary | ICD-10-CM | POA: Diagnosis not present

## 2014-12-22 DIAGNOSIS — I10 Essential (primary) hypertension: Secondary | ICD-10-CM | POA: Diagnosis not present

## 2014-12-22 DIAGNOSIS — M879 Osteonecrosis, unspecified: Secondary | ICD-10-CM | POA: Diagnosis not present

## 2014-12-22 DIAGNOSIS — F17210 Nicotine dependence, cigarettes, uncomplicated: Secondary | ICD-10-CM | POA: Diagnosis not present

## 2014-12-22 DIAGNOSIS — M272 Inflammatory conditions of jaws: Secondary | ICD-10-CM | POA: Diagnosis not present

## 2014-12-25 ENCOUNTER — Encounter (HOSPITAL_BASED_OUTPATIENT_CLINIC_OR_DEPARTMENT_OTHER): Payer: Medicare Other | Attending: Surgery

## 2014-12-25 DIAGNOSIS — I10 Essential (primary) hypertension: Secondary | ICD-10-CM | POA: Insufficient documentation

## 2014-12-25 DIAGNOSIS — M109 Gout, unspecified: Secondary | ICD-10-CM | POA: Insufficient documentation

## 2014-12-25 DIAGNOSIS — L0201 Cutaneous abscess of face: Secondary | ICD-10-CM | POA: Diagnosis not present

## 2014-12-25 DIAGNOSIS — F17210 Nicotine dependence, cigarettes, uncomplicated: Secondary | ICD-10-CM | POA: Diagnosis not present

## 2014-12-25 DIAGNOSIS — M272 Inflammatory conditions of jaws: Secondary | ICD-10-CM | POA: Diagnosis not present

## 2014-12-25 DIAGNOSIS — M879 Osteonecrosis, unspecified: Secondary | ICD-10-CM | POA: Insufficient documentation

## 2014-12-26 DIAGNOSIS — M879 Osteonecrosis, unspecified: Secondary | ICD-10-CM | POA: Diagnosis not present

## 2014-12-26 DIAGNOSIS — F17210 Nicotine dependence, cigarettes, uncomplicated: Secondary | ICD-10-CM | POA: Diagnosis not present

## 2014-12-26 DIAGNOSIS — L0201 Cutaneous abscess of face: Secondary | ICD-10-CM | POA: Diagnosis not present

## 2014-12-26 DIAGNOSIS — I10 Essential (primary) hypertension: Secondary | ICD-10-CM | POA: Diagnosis not present

## 2014-12-26 DIAGNOSIS — M272 Inflammatory conditions of jaws: Secondary | ICD-10-CM | POA: Diagnosis not present

## 2014-12-26 DIAGNOSIS — M109 Gout, unspecified: Secondary | ICD-10-CM | POA: Diagnosis not present

## 2014-12-27 DIAGNOSIS — M879 Osteonecrosis, unspecified: Secondary | ICD-10-CM | POA: Diagnosis not present

## 2014-12-27 DIAGNOSIS — M272 Inflammatory conditions of jaws: Secondary | ICD-10-CM | POA: Diagnosis not present

## 2014-12-27 DIAGNOSIS — F17210 Nicotine dependence, cigarettes, uncomplicated: Secondary | ICD-10-CM | POA: Diagnosis not present

## 2014-12-27 DIAGNOSIS — F17218 Nicotine dependence, cigarettes, with other nicotine-induced disorders: Secondary | ICD-10-CM | POA: Diagnosis not present

## 2014-12-27 DIAGNOSIS — L0201 Cutaneous abscess of face: Secondary | ICD-10-CM | POA: Diagnosis not present

## 2014-12-27 DIAGNOSIS — I10 Essential (primary) hypertension: Secondary | ICD-10-CM | POA: Diagnosis not present

## 2014-12-27 DIAGNOSIS — M868X8 Other osteomyelitis, other site: Secondary | ICD-10-CM | POA: Diagnosis not present

## 2014-12-27 DIAGNOSIS — M109 Gout, unspecified: Secondary | ICD-10-CM | POA: Diagnosis not present

## 2014-12-28 DIAGNOSIS — L0201 Cutaneous abscess of face: Secondary | ICD-10-CM | POA: Diagnosis not present

## 2014-12-28 DIAGNOSIS — M272 Inflammatory conditions of jaws: Secondary | ICD-10-CM | POA: Diagnosis not present

## 2014-12-28 DIAGNOSIS — M879 Osteonecrosis, unspecified: Secondary | ICD-10-CM | POA: Diagnosis not present

## 2014-12-28 DIAGNOSIS — M109 Gout, unspecified: Secondary | ICD-10-CM | POA: Diagnosis not present

## 2014-12-28 DIAGNOSIS — I10 Essential (primary) hypertension: Secondary | ICD-10-CM | POA: Diagnosis not present

## 2014-12-28 DIAGNOSIS — M868X8 Other osteomyelitis, other site: Secondary | ICD-10-CM | POA: Diagnosis not present

## 2014-12-28 DIAGNOSIS — F17210 Nicotine dependence, cigarettes, uncomplicated: Secondary | ICD-10-CM | POA: Diagnosis not present

## 2014-12-29 DIAGNOSIS — F17210 Nicotine dependence, cigarettes, uncomplicated: Secondary | ICD-10-CM | POA: Diagnosis not present

## 2014-12-29 DIAGNOSIS — M879 Osteonecrosis, unspecified: Secondary | ICD-10-CM | POA: Diagnosis not present

## 2014-12-29 DIAGNOSIS — M109 Gout, unspecified: Secondary | ICD-10-CM | POA: Diagnosis not present

## 2014-12-29 DIAGNOSIS — I10 Essential (primary) hypertension: Secondary | ICD-10-CM | POA: Diagnosis not present

## 2014-12-29 DIAGNOSIS — M272 Inflammatory conditions of jaws: Secondary | ICD-10-CM | POA: Diagnosis not present

## 2014-12-29 DIAGNOSIS — L0201 Cutaneous abscess of face: Secondary | ICD-10-CM | POA: Diagnosis not present

## 2015-01-01 DIAGNOSIS — F17210 Nicotine dependence, cigarettes, uncomplicated: Secondary | ICD-10-CM | POA: Diagnosis not present

## 2015-01-01 DIAGNOSIS — I10 Essential (primary) hypertension: Secondary | ICD-10-CM | POA: Diagnosis not present

## 2015-01-01 DIAGNOSIS — L0201 Cutaneous abscess of face: Secondary | ICD-10-CM | POA: Diagnosis not present

## 2015-01-01 DIAGNOSIS — M272 Inflammatory conditions of jaws: Secondary | ICD-10-CM | POA: Diagnosis not present

## 2015-01-01 DIAGNOSIS — M879 Osteonecrosis, unspecified: Secondary | ICD-10-CM | POA: Diagnosis not present

## 2015-01-01 DIAGNOSIS — M109 Gout, unspecified: Secondary | ICD-10-CM | POA: Diagnosis not present

## 2015-01-02 ENCOUNTER — Other Ambulatory Visit: Payer: Self-pay | Admitting: Family

## 2015-01-02 DIAGNOSIS — M109 Gout, unspecified: Secondary | ICD-10-CM | POA: Diagnosis not present

## 2015-01-02 DIAGNOSIS — I10 Essential (primary) hypertension: Secondary | ICD-10-CM | POA: Diagnosis not present

## 2015-01-02 DIAGNOSIS — L0201 Cutaneous abscess of face: Secondary | ICD-10-CM | POA: Diagnosis not present

## 2015-01-02 DIAGNOSIS — F17210 Nicotine dependence, cigarettes, uncomplicated: Secondary | ICD-10-CM | POA: Diagnosis not present

## 2015-01-02 DIAGNOSIS — M879 Osteonecrosis, unspecified: Secondary | ICD-10-CM | POA: Diagnosis not present

## 2015-01-02 DIAGNOSIS — M272 Inflammatory conditions of jaws: Secondary | ICD-10-CM | POA: Diagnosis not present

## 2015-01-02 NOTE — Telephone Encounter (Signed)
Last seen 12/08/14 Marcus Carr  Last lipid  02/06/14

## 2015-01-02 NOTE — Telephone Encounter (Signed)
Medication refilled. Pt needs to be seen for chronic follow up

## 2015-01-03 DIAGNOSIS — M272 Inflammatory conditions of jaws: Secondary | ICD-10-CM | POA: Diagnosis not present

## 2015-01-03 DIAGNOSIS — F17218 Nicotine dependence, cigarettes, with other nicotine-induced disorders: Secondary | ICD-10-CM | POA: Diagnosis not present

## 2015-01-03 DIAGNOSIS — L0201 Cutaneous abscess of face: Secondary | ICD-10-CM | POA: Diagnosis not present

## 2015-01-03 DIAGNOSIS — I10 Essential (primary) hypertension: Secondary | ICD-10-CM | POA: Diagnosis not present

## 2015-01-03 DIAGNOSIS — M109 Gout, unspecified: Secondary | ICD-10-CM | POA: Diagnosis not present

## 2015-01-03 DIAGNOSIS — M879 Osteonecrosis, unspecified: Secondary | ICD-10-CM | POA: Diagnosis not present

## 2015-01-03 DIAGNOSIS — M868X8 Other osteomyelitis, other site: Secondary | ICD-10-CM | POA: Diagnosis not present

## 2015-01-03 DIAGNOSIS — F17210 Nicotine dependence, cigarettes, uncomplicated: Secondary | ICD-10-CM | POA: Diagnosis not present

## 2015-01-04 DIAGNOSIS — M879 Osteonecrosis, unspecified: Secondary | ICD-10-CM | POA: Diagnosis not present

## 2015-01-04 DIAGNOSIS — M272 Inflammatory conditions of jaws: Secondary | ICD-10-CM | POA: Diagnosis not present

## 2015-01-04 DIAGNOSIS — M109 Gout, unspecified: Secondary | ICD-10-CM | POA: Diagnosis not present

## 2015-01-04 DIAGNOSIS — F17210 Nicotine dependence, cigarettes, uncomplicated: Secondary | ICD-10-CM | POA: Diagnosis not present

## 2015-01-04 DIAGNOSIS — L0201 Cutaneous abscess of face: Secondary | ICD-10-CM | POA: Diagnosis not present

## 2015-01-04 DIAGNOSIS — I10 Essential (primary) hypertension: Secondary | ICD-10-CM | POA: Diagnosis not present

## 2015-01-05 DIAGNOSIS — M272 Inflammatory conditions of jaws: Secondary | ICD-10-CM | POA: Diagnosis not present

## 2015-01-05 DIAGNOSIS — M879 Osteonecrosis, unspecified: Secondary | ICD-10-CM | POA: Diagnosis not present

## 2015-01-05 DIAGNOSIS — I10 Essential (primary) hypertension: Secondary | ICD-10-CM | POA: Diagnosis not present

## 2015-01-05 DIAGNOSIS — F17210 Nicotine dependence, cigarettes, uncomplicated: Secondary | ICD-10-CM | POA: Diagnosis not present

## 2015-01-05 DIAGNOSIS — L0201 Cutaneous abscess of face: Secondary | ICD-10-CM | POA: Diagnosis not present

## 2015-01-05 DIAGNOSIS — M109 Gout, unspecified: Secondary | ICD-10-CM | POA: Diagnosis not present

## 2015-01-08 DIAGNOSIS — L0201 Cutaneous abscess of face: Secondary | ICD-10-CM | POA: Diagnosis not present

## 2015-01-08 DIAGNOSIS — M272 Inflammatory conditions of jaws: Secondary | ICD-10-CM | POA: Diagnosis not present

## 2015-01-08 DIAGNOSIS — M109 Gout, unspecified: Secondary | ICD-10-CM | POA: Diagnosis not present

## 2015-01-08 DIAGNOSIS — F17210 Nicotine dependence, cigarettes, uncomplicated: Secondary | ICD-10-CM | POA: Diagnosis not present

## 2015-01-08 DIAGNOSIS — I10 Essential (primary) hypertension: Secondary | ICD-10-CM | POA: Diagnosis not present

## 2015-01-08 DIAGNOSIS — M879 Osteonecrosis, unspecified: Secondary | ICD-10-CM | POA: Diagnosis not present

## 2015-01-09 DIAGNOSIS — L0201 Cutaneous abscess of face: Secondary | ICD-10-CM | POA: Diagnosis not present

## 2015-01-09 DIAGNOSIS — M272 Inflammatory conditions of jaws: Secondary | ICD-10-CM | POA: Diagnosis not present

## 2015-01-09 DIAGNOSIS — M109 Gout, unspecified: Secondary | ICD-10-CM | POA: Diagnosis not present

## 2015-01-09 DIAGNOSIS — M879 Osteonecrosis, unspecified: Secondary | ICD-10-CM | POA: Diagnosis not present

## 2015-01-09 DIAGNOSIS — I10 Essential (primary) hypertension: Secondary | ICD-10-CM | POA: Diagnosis not present

## 2015-01-09 DIAGNOSIS — F17210 Nicotine dependence, cigarettes, uncomplicated: Secondary | ICD-10-CM | POA: Diagnosis not present

## 2015-01-10 ENCOUNTER — Other Ambulatory Visit: Payer: Self-pay | Admitting: Family Medicine

## 2015-01-10 DIAGNOSIS — L0201 Cutaneous abscess of face: Secondary | ICD-10-CM | POA: Diagnosis not present

## 2015-01-10 DIAGNOSIS — M8668 Other chronic osteomyelitis, other site: Secondary | ICD-10-CM | POA: Diagnosis not present

## 2015-01-10 DIAGNOSIS — M272 Inflammatory conditions of jaws: Secondary | ICD-10-CM | POA: Diagnosis not present

## 2015-01-10 DIAGNOSIS — I10 Essential (primary) hypertension: Secondary | ICD-10-CM | POA: Diagnosis not present

## 2015-01-10 DIAGNOSIS — F17210 Nicotine dependence, cigarettes, uncomplicated: Secondary | ICD-10-CM | POA: Diagnosis not present

## 2015-01-10 DIAGNOSIS — M868X8 Other osteomyelitis, other site: Secondary | ICD-10-CM | POA: Diagnosis not present

## 2015-01-10 DIAGNOSIS — M109 Gout, unspecified: Secondary | ICD-10-CM | POA: Diagnosis not present

## 2015-01-10 DIAGNOSIS — M879 Osteonecrosis, unspecified: Secondary | ICD-10-CM | POA: Diagnosis not present

## 2015-01-10 NOTE — Telephone Encounter (Signed)
Last seen 12/08/14 Marcus Carr   Last lipid 02/06/14

## 2015-01-11 DIAGNOSIS — M879 Osteonecrosis, unspecified: Secondary | ICD-10-CM | POA: Diagnosis not present

## 2015-01-11 DIAGNOSIS — M109 Gout, unspecified: Secondary | ICD-10-CM | POA: Diagnosis not present

## 2015-01-11 DIAGNOSIS — L0201 Cutaneous abscess of face: Secondary | ICD-10-CM | POA: Diagnosis not present

## 2015-01-11 DIAGNOSIS — F17210 Nicotine dependence, cigarettes, uncomplicated: Secondary | ICD-10-CM | POA: Diagnosis not present

## 2015-01-11 DIAGNOSIS — I10 Essential (primary) hypertension: Secondary | ICD-10-CM | POA: Diagnosis not present

## 2015-01-11 DIAGNOSIS — M272 Inflammatory conditions of jaws: Secondary | ICD-10-CM | POA: Diagnosis not present

## 2015-01-15 DIAGNOSIS — F17210 Nicotine dependence, cigarettes, uncomplicated: Secondary | ICD-10-CM | POA: Diagnosis not present

## 2015-01-15 DIAGNOSIS — M879 Osteonecrosis, unspecified: Secondary | ICD-10-CM | POA: Diagnosis not present

## 2015-01-15 DIAGNOSIS — L0201 Cutaneous abscess of face: Secondary | ICD-10-CM | POA: Diagnosis not present

## 2015-01-15 DIAGNOSIS — M272 Inflammatory conditions of jaws: Secondary | ICD-10-CM | POA: Diagnosis not present

## 2015-01-15 DIAGNOSIS — M109 Gout, unspecified: Secondary | ICD-10-CM | POA: Diagnosis not present

## 2015-01-15 DIAGNOSIS — I10 Essential (primary) hypertension: Secondary | ICD-10-CM | POA: Diagnosis not present

## 2015-01-16 DIAGNOSIS — L0201 Cutaneous abscess of face: Secondary | ICD-10-CM | POA: Diagnosis not present

## 2015-01-16 DIAGNOSIS — I10 Essential (primary) hypertension: Secondary | ICD-10-CM | POA: Diagnosis not present

## 2015-01-16 DIAGNOSIS — M109 Gout, unspecified: Secondary | ICD-10-CM | POA: Diagnosis not present

## 2015-01-16 DIAGNOSIS — M879 Osteonecrosis, unspecified: Secondary | ICD-10-CM | POA: Diagnosis not present

## 2015-01-16 DIAGNOSIS — M272 Inflammatory conditions of jaws: Secondary | ICD-10-CM | POA: Diagnosis not present

## 2015-01-16 DIAGNOSIS — F17210 Nicotine dependence, cigarettes, uncomplicated: Secondary | ICD-10-CM | POA: Diagnosis not present

## 2015-01-17 DIAGNOSIS — L0201 Cutaneous abscess of face: Secondary | ICD-10-CM | POA: Diagnosis not present

## 2015-01-17 DIAGNOSIS — M109 Gout, unspecified: Secondary | ICD-10-CM | POA: Diagnosis not present

## 2015-01-17 DIAGNOSIS — M868X8 Other osteomyelitis, other site: Secondary | ICD-10-CM | POA: Diagnosis not present

## 2015-01-17 DIAGNOSIS — M879 Osteonecrosis, unspecified: Secondary | ICD-10-CM | POA: Diagnosis not present

## 2015-01-17 DIAGNOSIS — F17210 Nicotine dependence, cigarettes, uncomplicated: Secondary | ICD-10-CM | POA: Diagnosis not present

## 2015-01-17 DIAGNOSIS — M272 Inflammatory conditions of jaws: Secondary | ICD-10-CM | POA: Diagnosis not present

## 2015-01-17 DIAGNOSIS — I10 Essential (primary) hypertension: Secondary | ICD-10-CM | POA: Diagnosis not present

## 2015-01-19 DIAGNOSIS — M272 Inflammatory conditions of jaws: Secondary | ICD-10-CM | POA: Diagnosis not present

## 2015-01-19 DIAGNOSIS — M879 Osteonecrosis, unspecified: Secondary | ICD-10-CM | POA: Diagnosis not present

## 2015-01-19 DIAGNOSIS — L0201 Cutaneous abscess of face: Secondary | ICD-10-CM | POA: Diagnosis not present

## 2015-01-19 DIAGNOSIS — M109 Gout, unspecified: Secondary | ICD-10-CM | POA: Diagnosis not present

## 2015-01-19 DIAGNOSIS — F17210 Nicotine dependence, cigarettes, uncomplicated: Secondary | ICD-10-CM | POA: Diagnosis not present

## 2015-01-19 DIAGNOSIS — I10 Essential (primary) hypertension: Secondary | ICD-10-CM | POA: Diagnosis not present

## 2015-01-23 ENCOUNTER — Encounter (HOSPITAL_BASED_OUTPATIENT_CLINIC_OR_DEPARTMENT_OTHER): Payer: Medicare Other | Attending: General Surgery

## 2015-01-25 ENCOUNTER — Ambulatory Visit: Payer: Medicare Other | Admitting: Family

## 2015-01-31 ENCOUNTER — Other Ambulatory Visit: Payer: Self-pay | Admitting: Family

## 2015-02-02 ENCOUNTER — Encounter: Payer: Self-pay | Admitting: Family

## 2015-02-02 ENCOUNTER — Ambulatory Visit (INDEPENDENT_AMBULATORY_CARE_PROVIDER_SITE_OTHER): Payer: Medicare Other | Admitting: Family

## 2015-02-02 VITALS — BP 110/71 | HR 82 | Temp 97.0°F | Ht 67.5 in | Wt 214.4 lb

## 2015-02-02 DIAGNOSIS — R5383 Other fatigue: Secondary | ICD-10-CM

## 2015-02-02 DIAGNOSIS — I493 Ventricular premature depolarization: Secondary | ICD-10-CM

## 2015-02-02 DIAGNOSIS — R82998 Other abnormal findings in urine: Secondary | ICD-10-CM

## 2015-02-02 DIAGNOSIS — R8299 Other abnormal findings in urine: Secondary | ICD-10-CM | POA: Diagnosis not present

## 2015-02-02 DIAGNOSIS — I10 Essential (primary) hypertension: Secondary | ICD-10-CM | POA: Diagnosis not present

## 2015-02-02 DIAGNOSIS — R002 Palpitations: Secondary | ICD-10-CM | POA: Diagnosis not present

## 2015-02-02 DIAGNOSIS — R531 Weakness: Secondary | ICD-10-CM

## 2015-02-02 LAB — POCT URINALYSIS DIPSTICK
BILIRUBIN UA: NEGATIVE
Glucose, UA: NEGATIVE
KETONES UA: NEGATIVE
Nitrite, UA: NEGATIVE
PROTEIN UA: NEGATIVE
SPEC GRAV UA: 1.025
Urobilinogen, UA: NEGATIVE
pH, UA: 6.5

## 2015-02-02 LAB — POCT UA - MICROSCOPIC ONLY
Bacteria, U Microscopic: NEGATIVE
CRYSTALS, UR, HPF, POC: NEGATIVE
Casts, Ur, LPF, POC: NEGATIVE
MUCUS UA: NEGATIVE
YEAST UA: NEGATIVE

## 2015-02-02 MED ORDER — VERAPAMIL HCL ER 180 MG PO TBCR: 180.0000 mg | EXTENDED_RELEASE_TABLET | Freq: Every day | ORAL | Status: DC

## 2015-02-02 NOTE — Patient Instructions (Signed)
Fatigue  Fatigue is feeling tired all of the time, a lack of energy, or a lack of motivation. Occasional or mild fatigue is often a normal response to activity or life in general. However, long-lasting (chronic) or extreme fatigue may indicate an underlying medical condition.  HOME CARE INSTRUCTIONS   Watch your fatigue for any changes. The following actions may help to lessen any discomfort you are feeling:  · Talk to your health care provider about how much sleep you need each night. Try to get the required amount every night.  · Take medicines only as directed by your health care provider.  · Eat a healthy and nutritious diet. Ask your health care provider if you need help changing your diet.  · Drink enough fluid to keep your urine clear or pale yellow.  · Practice ways of relaxing, such as yoga, meditation, massage therapy, or acupuncture.  · Exercise regularly.    · Change situations that cause you stress. Try to keep your work and personal routine reasonable.  · Do not abuse illegal drugs.  · Limit alcohol intake to no more than 1 drink per day for nonpregnant women and 2 drinks per day for men. One drink equals 12 ounces of beer, 5 ounces of wine, or 1½ ounces of hard liquor.  · Take a multivitamin, if directed by your health care provider.  SEEK MEDICAL CARE IF:   · Your fatigue does not get better.  · You have a fever.    · You have unintentional weight loss or gain.  · You have headaches.    · You have difficulty:      Falling asleep.    Sleeping throughout the night.  · You feel angry, guilty, anxious, or sad.     · You are unable to have a bowel movement (constipation).    · You skin is dry.     · Your legs or another part of your body is swollen.    SEEK IMMEDIATE MEDICAL CARE IF:   · You feel confused.    · Your vision is blurry.  · You feel faint or pass out.    · You have a severe headache.    · You have severe abdominal, pelvic, or back pain.    · You have chest pain, shortness of breath, or an  irregular or fast heartbeat.    · You are unable to urinate or you urinate less than normal.    · You develop abnormal bleeding, such as bleeding from the rectum, vagina, nose, lungs, or nipples.  · You vomit blood.     · You have thoughts about harming yourself or committing suicide.    · You are worried that you might harm someone else.       This information is not intended to replace advice given to you by your health care provider. Make sure you discuss any questions you have with your health care provider.     Document Released: 01/05/2007 Document Revised: 03/31/2014 Document Reviewed: 07/12/2013  Elsevier Interactive Patient Education ©2016 [employer]

## 2015-02-02 NOTE — Progress Notes (Signed)
Subjective:    Patient ID: Marcus Carr, male    DOB: 1940-05-02, 74 y.o.   MRN: [id number]  HPI Pt presents to the office today for weakness. Pt states this has been going on for the last few weeks. Pt states when he stands up he feels weak. Pt states he has felt his heart "flutterling". Pt states his potassium was low a few years ago and made him feel like this. Pt denies and SOB, chest pain, or edema. Pt has osteomyelitis of his mandible and is currently trying to get appt at [facility] for an oral surgeon. Pt states this has been going on since August and has been on multiple antibiotics.    Review of Systems  Constitutional: Negative.   HENT: Negative.   Respiratory: Negative.   Cardiovascular: Negative.   Gastrointestinal: Negative.   Endocrine: Negative.   Genitourinary: Negative.   Musculoskeletal: Negative.   Neurological: Negative.   Hematological: Negative.   Psychiatric/Behavioral: Negative.   All other systems reviewed and are negative.      Objective:   Physical Exam  Constitutional: He is oriented to person, place, and time. He appears well-developed and well-nourished. No distress.  HENT:  Head: Normocephalic.  Eyes: Pupils are equal, round, and reactive to light. Right eye exhibits no discharge. Left eye exhibits no discharge.  Neck: Normal range of motion. Neck supple. No thyromegaly present.  Cardiovascular: Normal rate, regular rhythm, normal heart sounds and intact distal pulses.   No murmur heard. Pulmonary/Chest: Effort normal and breath sounds normal. No respiratory distress. He has no wheezes.  Abdominal: Soft. Bowel sounds are normal. He exhibits no distension. There is no tenderness.  Musculoskeletal: Normal range of motion. He exhibits no edema or tenderness.  Neurological: He is alert and oriented to person, place, and time. He has normal reflexes. No cranial nerve deficit.  Skin: Skin is warm and dry. No rash noted. No erythema.  Psychiatric:  He has a normal mood and affect. His behavior is normal. Judgment and thought content normal.  Vitals reviewed.     BP 110/71 mmHg  Pulse 82  Temp(Src) 97 F (36.1 C) (Oral)  Ht 5' 7.5" (1.715 m)  Wt 214 lb 6.4 oz (97.251 kg)  BMI 33.06 kg/m2     Assessment & Plan:  1. Weakness - Anemia Profile B - POCT UA - Microscopic Only - POCT urinalysis dipstick - CMP14+EGFR - Thyroid Panel With TSH - EKG 12-Lead  2. Other fatigue - Anemia Profile B - POCT UA - Microscopic Only - POCT urinalysis dipstick - CMP14+EGFR - Thyroid Panel With TSH - EKG 12-Lead - Ambulatory referral to Cardiology  3. Palpitations - Anemia Profile B - POCT UA - Microscopic Only - POCT urinalysis dipstick - CMP14+EGFR - Thyroid Panel With TSH - EKG 12-Lead - Ambulatory referral to Cardiology  4. Dark urine - POCT UA - Microscopic Only - POCT urinalysis dipstick  5. PVC's (premature ventricular contractions) - verapamil (CALAN-SR) 180 MG CR tablet; Take 1 tablet (180 mg total) by mouth at bedtime.  Dispense: 90 tablet; Refill: 1 - Ambulatory referral to Cardiology  6. Essential hypertension - verapamil (CALAN-SR) 180 MG CR tablet; Take 1 tablet (180 mg total) by mouth at bedtime.  Dispense: 90 tablet; Refill: 1 - Ambulatory referral to Cardiology   Verapamil decreased to 180 mg from 240 mg related to orthostatic hypotension Increase fluid intake Labs pending Health Maintenance reviewed Diet and exercise encouraged RTO 1 week   [name],  FNP

## 2015-02-03 LAB — ANEMIA PROFILE B
BASOS ABS: 0 10*3/uL (ref 0.0–0.2)
BASOS: 0 %
EOS (ABSOLUTE): 0.1 10*3/uL (ref 0.0–0.4)
EOS: 2 %
Ferritin: 332 ng/mL (ref 30–400)
Folate: 10.3 ng/mL (ref >3.0)
HEMOGLOBIN: 12.9 g/dL (ref 12.6–17.7)
Hematocrit: 39.5 % (ref 37.5–51.0)
IRON SATURATION: 58 % — AB (ref 15–55)
IRON: 88 ug/dL (ref 38–169)
Immature Grans (Abs): 0 10*3/uL (ref 0.0–0.1)
Immature Granulocytes: 0 %
Lymphocytes Absolute: 1.3 10*3/uL (ref 0.7–3.1)
Lymphs: 27 %
MCH: 29.5 pg (ref 26.6–33.0)
MCHC: 32.7 g/dL (ref 31.5–35.7)
MCV: 90 fL (ref 79–97)
MONOCYTES: 12 %
Monocytes Absolute: 0.6 10*3/uL (ref 0.1–0.9)
NEUTROS ABS: 2.8 10*3/uL (ref 1.4–7.0)
Neutrophils: 59 %
PLATELETS: 165 10*3/uL (ref 150–379)
RBC: 4.37 x10E6/uL (ref 4.14–5.80)
RDW: 16.5 % — ABNORMAL HIGH (ref 12.3–15.4)
RETIC CT PCT: 0.9 % (ref 0.6–2.6)
TIBC: 153 ug/dL — AB (ref 250–450)
UIBC: 65 ug/dL — AB (ref 111–343)
VITAMIN B 12: 376 pg/mL (ref 211–946)
WBC: 4.7 10*3/uL (ref 3.4–10.8)

## 2015-02-03 LAB — CMP14+EGFR
ALBUMIN: 3.9 g/dL (ref 3.5–4.8)
ALT: 30 IU/L (ref 0–44)
AST: 36 IU/L (ref 0–40)
Albumin/Globulin Ratio: 1.4 (ref 1.1–2.5)
Alkaline Phosphatase: 59 IU/L (ref 39–117)
BILIRUBIN TOTAL: 0.4 mg/dL (ref 0.0–1.2)
BUN / CREAT RATIO: 16 (ref 10–22)
BUN: 18 mg/dL (ref 8–27)
CHLORIDE: 100 mmol/L (ref 97–106)
CO2: 26 mmol/L (ref 18–29)
CREATININE: 1.11 mg/dL (ref 0.76–1.27)
Calcium: 9.4 mg/dL (ref 8.6–10.2)
GFR calc non Af Amer: 66 mL/min/{1.73_m2} (ref >59)
GFR, EST AFRICAN AMERICAN: 76 mL/min/{1.73_m2} (ref >59)
GLOBULIN, TOTAL: 2.7 g/dL (ref 1.5–4.5)
GLUCOSE: 99 mg/dL (ref 65–99)
Potassium: 4.1 mmol/L (ref 3.5–5.2)
Sodium: 140 mmol/L (ref 136–144)
TOTAL PROTEIN: 6.6 g/dL (ref 6.0–8.5)

## 2015-02-03 LAB — THYROID PANEL WITH TSH
FREE THYROXINE INDEX: 2.4 (ref 1.2–4.9)
T3 Uptake Ratio: 35 % (ref 24–39)
T4, Total: 6.9 ug/dL (ref 4.5–12.0)
TSH: 1.95 u[IU]/mL (ref 0.450–4.500)

## 2015-02-08 ENCOUNTER — Other Ambulatory Visit: Payer: Self-pay | Admitting: Family

## 2015-02-09 ENCOUNTER — Ambulatory Visit (INDEPENDENT_AMBULATORY_CARE_PROVIDER_SITE_OTHER): Payer: Medicare Other | Admitting: Family

## 2015-02-09 ENCOUNTER — Encounter: Payer: Self-pay | Admitting: Family

## 2015-02-09 VITALS — BP 120/79 | HR 81 | Temp 97.0°F | Ht 67.5 in | Wt 212.4 lb

## 2015-02-09 DIAGNOSIS — R5383 Other fatigue: Secondary | ICD-10-CM | POA: Diagnosis not present

## 2015-02-09 DIAGNOSIS — R319 Hematuria, unspecified: Secondary | ICD-10-CM

## 2015-02-09 LAB — POCT URINALYSIS DIPSTICK
Glucose, UA: NEGATIVE
LEUKOCYTES UA: NEGATIVE
NITRITE UA: NEGATIVE
PH UA: 6.5
Spec Grav, UA: 1.025
UROBILINOGEN UA: NEGATIVE

## 2015-02-09 LAB — POCT UA - MICROSCOPIC ONLY
Casts, Ur, LPF, POC: NEGATIVE
Crystals, Ur, HPF, POC: NEGATIVE
Yeast, UA: NEGATIVE

## 2015-02-09 NOTE — Progress Notes (Signed)
   Subjective:    Patient ID: Marcus Carr, male    DOB: Sep 02, 1940, 74 y.o.   MRN: [id number]  HPI Pt presents to the office today to recheck fatigue. Pt was seen in the office on 11/11 with weakness, fatigue, and palpitations. PT was referred to a Cardiologists and has an appt in December. Pt's anemia panel, CMP, and TSH were all stable. Pt reports the fatigue has greatly improved and states he is "getting out and doing a lot more". PT states he does not seem as weak as he was and denies any headache, palpitations, SOB, or edema at this time.  Pt also has an appt in [place] with Dr. Owens Shark about the osteomyelitis of his mandible on Wednesday.      Review of Systems  Constitutional: Negative.   HENT: Negative.   Respiratory: Negative.   Cardiovascular: Negative.   Gastrointestinal: Negative.   Endocrine: Negative.   Genitourinary: Negative.   Musculoskeletal: Negative.   Neurological: Negative.   Hematological: Negative.   Psychiatric/Behavioral: Negative.   All other systems reviewed and are negative.      Objective:   Physical Exam  Constitutional: He is oriented to person, place, and time. He appears well-developed and well-nourished. No distress.  Eyes: Pupils are equal, round, and reactive to light. Right eye exhibits no discharge. Left eye exhibits no discharge.  Neck: Normal range of motion. Neck supple. No thyromegaly present.  Cardiovascular: Normal rate, regular rhythm, normal heart sounds and intact distal pulses.   No murmur heard. Pulmonary/Chest: Effort normal and breath sounds normal. No respiratory distress. He has no wheezes.  Abdominal: Soft. Bowel sounds are normal. He exhibits no distension. There is no tenderness.  Musculoskeletal: Normal range of motion. He exhibits no edema or tenderness.  Neurological: He is alert and oriented to person, place, and time. He has normal reflexes. No cranial nerve deficit.  Skin: Skin is warm and dry. No rash noted. No  erythema.  Psychiatric: He has a normal mood and affect. His behavior is normal. Judgment and thought content normal.  Vitals reviewed.   BP 120/79 mmHg  Pulse 81  Temp(Src) 97 F (36.1 C) (Oral)  Ht 5' 7.5" (1.715 m)  Wt 212 lb 6.4 oz (96.344 kg)  BMI 32.76 kg/m2       Assessment & Plan:  1. Other fatigue -Pt reports fatigue and weakness greatly improved- May have been a viral illness? -Keep all appts with Cardiologists and Oral Surgeon   2. Hematuria -Will recheck urine since last sample was positive for blood -Force fluids -RTO prn - POCT urinalysis dipstick - POCT UA - Microscopic Only  [name], FNP

## 2015-02-09 NOTE — Patient Instructions (Signed)

## 2015-02-14 DIAGNOSIS — Z87891 Personal history of nicotine dependence: Secondary | ICD-10-CM | POA: Diagnosis not present

## 2015-02-14 DIAGNOSIS — M272 Inflammatory conditions of jaws: Secondary | ICD-10-CM | POA: Diagnosis not present

## 2015-02-23 ENCOUNTER — Ambulatory Visit (INDEPENDENT_AMBULATORY_CARE_PROVIDER_SITE_OTHER): Payer: Medicare Other | Admitting: Cardiology

## 2015-02-23 ENCOUNTER — Encounter (HOSPITAL_BASED_OUTPATIENT_CLINIC_OR_DEPARTMENT_OTHER): Payer: Medicare Other | Attending: Internal Medicine

## 2015-02-23 ENCOUNTER — Other Ambulatory Visit: Payer: Self-pay | Admitting: Internal Medicine

## 2015-02-23 ENCOUNTER — Encounter: Payer: Self-pay | Admitting: Cardiology

## 2015-02-23 VITALS — BP 118/72 | HR 64 | Ht 68.5 in | Wt 214.3 lb

## 2015-02-23 DIAGNOSIS — M272 Inflammatory conditions of jaws: Secondary | ICD-10-CM | POA: Insufficient documentation

## 2015-02-23 DIAGNOSIS — F17210 Nicotine dependence, cigarettes, uncomplicated: Secondary | ICD-10-CM | POA: Diagnosis not present

## 2015-02-23 DIAGNOSIS — R9431 Abnormal electrocardiogram [ECG] [EKG]: Secondary | ICD-10-CM

## 2015-02-23 DIAGNOSIS — M879 Osteonecrosis, unspecified: Secondary | ICD-10-CM | POA: Insufficient documentation

## 2015-02-23 DIAGNOSIS — M868X8 Other osteomyelitis, other site: Secondary | ICD-10-CM

## 2015-02-23 DIAGNOSIS — I10 Essential (primary) hypertension: Secondary | ICD-10-CM | POA: Diagnosis not present

## 2015-02-23 DIAGNOSIS — M109 Gout, unspecified: Secondary | ICD-10-CM | POA: Diagnosis not present

## 2015-02-23 NOTE — Progress Notes (Signed)
Cardiology Office Note   Date:  02/23/2015   ID:  Marcus, Carr November 23, 1940, MRN [id number]  PCP:  Marcus Dun, FNP  Cardiologist:   Marcus Breeding, MD   No chief complaint on file.     History of Present Illness: Marcus Carr is a 74 y.o. male who presents for evaluation of syncope. He has no prior cardiac history though he does have risk factors and an abnormal EKG as listed below. He's recently been managed for mandibular osteomyelitis. He's had to undergo bariatric therapy is and antibiotics. Once while walking into his therapy he got out of his truck he thinks too quickly stood up and when he walked in actually had a slight syncopal episode. He doesn't notice any palpitations, presyncope or syncope usually. He doesn't describe orthostatic symptoms. He does not have chest pressure, neck or arm discomfort. He has had hypertension which has been somewhat difficult to control but not as bad since he lost 30 pounds with his jaw problems. Of note he has a long history of smoking but actually quit smoking recently. He is active and was able to put a new alternator on his car which was a 4 hour job recently and he had no symptoms with this.  Past Medical History  Diagnosis Date  . Gout   . Hyperlipidemia   . Hypertension   . Back pain     chronic back pain treated by Dr. Nelva Carr  . Anxiety   . Chronic kidney disease   . Abscess, jaw   . Chronic mastoiditis of both sides 11/07/2014  . Cigarette smoker 11/07/2014  . Morbid obesity ([place]) 11/07/2014  . Osteomyelitis of mandible 11/07/2014  . Poor dentition 11/07/2014  . Thyroid nodule 11/07/2014    Past Surgical History  Procedure Laterality Date  . Appendectomy    . Tonsillectomy       Current Outpatient Prescriptions  Medication Sig Dispense Refill  . allopurinol (ZYLOPRIM) 300 MG tablet Take 1 tablet (300 mg total) by mouth daily. 90 tablet 3  . aspirin 325 MG tablet Take 325 mg by mouth daily.    Marland Kitchen atorvastatin  (LIPITOR) 20 MG tablet TAKE 1 TABLET (20 MG TOTAL) BY MOUTH DAILY AT 6 PM. 90 tablet 0  . cholecalciferol (VITAMIN D) 400 UNITS TABS tablet Take 800 Units by mouth daily.    . citalopram (CELEXA) 20 MG tablet TAKE 1 TABLET DAILY 90 tablet 3  . fish oil-omega-3 fatty acids 1000 MG capsule Take 1 g by mouth 2 (two) times daily.     . niacin (NIASPAN) 1000 MG CR tablet TAKE 1 TABLET (1,000 MG TOTAL) BY MOUTH AT BEDTIME. 90 tablet 0  . oxyCODONE-acetaminophen (PERCOCET) 10-325 MG per tablet Take 1 tablet by mouth every 12 (twelve) hours as needed. 90 tablet 0  . varenicline (CHANTIX CONTINUING MONTH PAK) 1 MG tablet Take 1 tablet (1 mg total) by mouth 2 (two) times daily. 60 tablet 2  . verapamil (CALAN-SR) 180 MG CR tablet Take 1 tablet (180 mg total) by mouth at bedtime. 90 tablet 1  . Vitamin D, Cholecalciferol, 400 UNITS CAPS Take 1 tablet by mouth daily. Take 1 tab daily     No current facility-administered medications for this visit.    Allergies:   Penicillins    Social History:  The patient  reports that he quit smoking about 4 months ago. His smoking use included Cigarettes. He has a 75 pack-year smoking history. He has never used  smokeless tobacco. He reports that he does not drink alcohol or use illicit drugs.   Family History:  The patient's family history includes Cancer in his mother; Heart attack (age of onset: 70) in his father.    ROS:  Please see the history of present illness.   Otherwise, review of systems are positive for none.   All other systems are reviewed and negative.    PHYSICAL EXAM: VS:  BP 118/72 mmHg  Pulse 64  Ht 5' 8.5" (1.74 m)  Wt 214 lb 5 oz (97.212 kg)  BMI 32.11 kg/m2 , BMI Body mass index is 32.11 kg/(m^2). GENERAL:  Well appearing HEENT:  Pupils equal round and reactive, fundi not visualized, oral mucosa unremarkable NECK:  No jugular venous distention, waveform within normal limits, carotid upstroke brisk and symmetric, no bruits, no  thyromegaly LYMPHATICS:  No cervical, inguinal adenopathy LUNGS:  Clear to auscultation bilaterally BACK:  No CVA tenderness CHEST:  Unremarkable HEART:  PMI not displaced or sustained,S1 and S2 within normal limits, no S3, no S4, no clicks, no rubs, no murmurs ABD:  Flat, positive bowel sounds normal in frequency in pitch, no bruits, no rebound, no guarding, no midline pulsatile mass, no hepatomegaly, no splenomegaly EXT:  2 plus pulses throughout, no edema, no cyanosis no clubbing SKIN:  No rashes no nodules NEURO:  Cranial nerves II through XII grossly intact, motor grossly intact throughout PSYCH:  Cognitively intact, oriented to person place and time    EKG:  EKG is not ordered today. The ekg ordered 02/02/15 demonstrates sinus rhythm, rate 75, premature ventricular contractions, probable old lateral infarct age undetermined, no acute ST-T wave changes.   Recent Labs: 11/08/2014: Hemoglobin 11.7*; Platelets 233 11/10/2014: Magnesium 1.6* 02/02/2015: ALT 30; BUN 18; Creatinine, Ser 1.11; Potassium 4.1; Sodium 140; TSH 1.950    Lipid Panel    Component Value Date/Time   CHOL 115 02/06/2014 0918   TRIG 105 02/06/2014 0918   HDL 39* 02/06/2014 0918   CHOLHDL 2.9 02/06/2014 0918   LDLCALC 55 02/06/2014 0918      Wt Readings from Last 3 Encounters:  02/23/15 214 lb 5 oz (97.212 kg)  02/09/15 212 lb 6.4 oz (96.344 kg)  02/02/15 214 lb 6.4 oz (97.251 kg)      Other studies Reviewed: Additional studies/ records that were reviewed today include: Hospital records. Review of the above records demonstrates:  Please see elsewhere in the note.     ASSESSMENT AND PLAN:  SYNCOPE:  This may well have been an orthostatic issue and is no longer having symptoms. No further evaluation for this is planned.  ABNORMAL EKG:  Patient does have suggestion of an old lateral infarct. He's going to have a jaw surgery. Preoperative screening. I will bring him back for a POET (Plain Old Exercise  Treadmill)   Current medicines are reviewed at length with the patient today.  The patient does not have concerns regarding medicines.  The following changes have been made:  no change  Labs/ tests ordered today include:   Orders Placed This Encounter  Procedures  . Exercise Tolerance Test     Disposition:   FU with me as needed.      Signed, Marcus Breeding, MD  02/23/2015 4:06 PM    [facility]

## 2015-02-23 NOTE — Patient Instructions (Signed)
Your physician recommends that you schedule a follow-up appointment in: As Needed  Your physician has requested that you have an exercise tolerance test. For further information please visit [email or web address]. Please also follow instruction sheet, as given.    

## 2015-02-28 ENCOUNTER — Other Ambulatory Visit: Payer: Self-pay | Admitting: *Deleted

## 2015-02-28 DIAGNOSIS — Z1211 Encounter for screening for malignant neoplasm of colon: Secondary | ICD-10-CM

## 2015-03-01 ENCOUNTER — Other Ambulatory Visit (INDEPENDENT_AMBULATORY_CARE_PROVIDER_SITE_OTHER): Payer: Medicare Other

## 2015-03-01 DIAGNOSIS — M868X8 Other osteomyelitis, other site: Secondary | ICD-10-CM

## 2015-03-01 DIAGNOSIS — M879 Osteonecrosis, unspecified: Secondary | ICD-10-CM

## 2015-03-01 NOTE — Progress Notes (Signed)
labs for dr. Laurene Footman wound care [place] BMP dx. M87.9, m86.8x8

## 2015-03-07 ENCOUNTER — Ambulatory Visit (HOSPITAL_COMMUNITY): Admission: RE | Admit: 2015-03-07 | Payer: Medicare Other | Source: Ambulatory Visit

## 2015-03-09 ENCOUNTER — Ambulatory Visit (HOSPITAL_COMMUNITY)
Admission: RE | Admit: 2015-03-09 | Discharge: 2015-03-09 | Disposition: A | Discharge status: Home / Self Care | Payer: Medicare Other | Source: Ambulatory Visit | Attending: Internal Medicine | Admitting: Internal Medicine

## 2015-03-09 DIAGNOSIS — M868X8 Other osteomyelitis, other site: Secondary | ICD-10-CM | POA: Insufficient documentation

## 2015-03-09 DIAGNOSIS — M8448XA Pathological fracture, other site, initial encounter for fracture: Secondary | ICD-10-CM | POA: Insufficient documentation

## 2015-03-09 DIAGNOSIS — M272 Inflammatory conditions of jaws: Secondary | ICD-10-CM | POA: Diagnosis not present

## 2015-03-09 DIAGNOSIS — M879 Osteonecrosis, unspecified: Secondary | ICD-10-CM | POA: Diagnosis not present

## 2015-03-09 MED ORDER — GADOBENATE DIMEGLUMINE 529 MG/ML IV SOLN
20.0000 mL | Freq: Once | INTRAVENOUS | Status: AC | PRN
  Administered 2015-03-09: 20 mL via INTRAVENOUS

## 2015-03-22 ENCOUNTER — Inpatient Hospital Stay (HOSPITAL_COMMUNITY): Admission: RE | Admit: 2015-03-22 | Payer: Medicare Other | Source: Ambulatory Visit

## 2015-03-31 ENCOUNTER — Other Ambulatory Visit: Payer: Self-pay | Admitting: Family

## 2015-04-02 NOTE — Telephone Encounter (Signed)
Last seen 02/09/15 Marcus Carr  Last lipid 02/06/14

## 2015-04-03 ENCOUNTER — Telehealth (HOSPITAL_COMMUNITY): Payer: Self-pay

## 2015-04-03 ENCOUNTER — Ambulatory Visit (INDEPENDENT_AMBULATORY_CARE_PROVIDER_SITE_OTHER): Payer: Medicare Other | Admitting: Family

## 2015-04-03 ENCOUNTER — Encounter: Payer: Self-pay | Admitting: Family

## 2015-04-03 VITALS — BP 131/78 | HR 57 | Temp 95.9°F | Ht 67.5 in | Wt 222.4 lb

## 2015-04-03 DIAGNOSIS — M272 Inflammatory conditions of jaws: Secondary | ICD-10-CM

## 2015-04-03 DIAGNOSIS — K089 Disorder of teeth and supporting structures, unspecified: Secondary | ICD-10-CM | POA: Diagnosis not present

## 2015-04-03 DIAGNOSIS — H7013 Chronic mastoiditis, bilateral: Secondary | ICD-10-CM

## 2015-04-03 MED ORDER — CLINDAMYCIN HCL 300 MG PO CAPS: 300.0000 mg | ORAL_CAPSULE | Freq: Four times a day (QID) | ORAL | Status: DC

## 2015-04-03 NOTE — Telephone Encounter (Signed)
Encounter complete. 

## 2015-04-03 NOTE — Patient Instructions (Signed)

## 2015-04-03 NOTE — Progress Notes (Signed)
   Subjective:    Patient ID: Marcus Carr, male    DOB: [birth year], 75 y.o.   MRN: [id number]  HPI Pt presents to the office today with "swelling of his gums". PT has osteomyelitis of the mandible, abscess of the jaw and has seen several dentists and has been referred to an oral surgeon in [facility]. PT states he has an appt next week with the Oral surgeon. PT states he has been on several antibiotics, but has not been on any in the past month. PT has also gone to the wound care center in [place] and had 30 hyperbaric oxygen therapy treatments. PT reports swelling of his gums and redness. Pt denies any warmth or pain at this time. Pt states he has started having a scant amount of yellow drainage from his chin. Pt states he has a "hole" in his chin that has never healed.    Review of Systems  Constitutional: Negative.   HENT: Negative.   Respiratory: Negative.   Cardiovascular: Negative.   Gastrointestinal: Negative.   Endocrine: Negative.   Genitourinary: Negative.   Musculoskeletal: Negative.   Neurological: Negative.   Hematological: Negative.   Psychiatric/Behavioral: Negative.   All other systems reviewed and are negative.      Objective:   Physical Exam  Constitutional: He is oriented to person, place, and time. He appears well-developed and well-nourished. No distress.  HENT:  Head: Normocephalic.  Mouth/Throat: Abnormal dentition (erythemas of lower gum with moderate amt of swelling. ). Dental abscesses and dental caries present.  Eyes: Pupils are equal, round, and reactive to light. Right eye exhibits no discharge. Left eye exhibits no discharge.  Cardiovascular: Normal rate, regular rhythm, normal heart sounds and intact distal pulses.   No murmur heard. Pulmonary/Chest: Effort normal and breath sounds normal. No respiratory distress. He has no wheezes.  Abdominal: Soft. Bowel sounds are normal. He exhibits no distension. There is no tenderness.    Musculoskeletal: Normal range of motion. He exhibits no edema or tenderness.  Neurological: He is alert and oriented to person, place, and time. He has normal reflexes. No cranial nerve deficit.  Skin: Skin is warm and dry. No rash noted. No erythema.  Psychiatric: He has a normal mood and affect. His behavior is normal. Judgment and thought content normal.  Vitals reviewed.   BP 131/78 mmHg  Pulse 57  Temp(Src) 95.9 F (35.5 C) (Oral)  Ht 5' 7.5" (1.715 m)  Wt 222 lb 6.4 oz (100.88 kg)  BMI 34.30 kg/m2       Assessment & Plan:  1. Poor dentition - clindamycin (CLEOCIN) 300 MG capsule; Take 1 capsule (300 mg total) by mouth 4 (four) times daily.  Dispense: 40 capsule; Refill: 0  2. Osteomyelitis of mandible - clindamycin (CLEOCIN) 300 MG capsule; Take 1 capsule (300 mg total) by mouth 4 (four) times daily.  Dispense: 40 capsule; Refill: 0  3. Chronic mastoiditis of both sides - clindamycin (CLEOCIN) 300 MG capsule; Take 1 capsule (300 mg total) by mouth 4 (four) times daily.  Dispense: 40 capsule; Refill: 0  4. Abscess, jaw - clindamycin (CLEOCIN) 300 MG capsule; Take 1 capsule (300 mg total) by mouth 4 (four) times daily.  Dispense: 40 capsule; Refill: 0  KEEP appt with Oral Surgeon!!  Encourage pt to gargle with warm water or salt water after meals RTO prn   [name], FNP

## 2015-04-05 ENCOUNTER — Ambulatory Visit (HOSPITAL_COMMUNITY)
Admission: RE | Admit: 2015-04-05 | Discharge: 2015-04-05 | Disposition: A | Discharge status: Home / Self Care | Payer: Medicare Other | Source: Ambulatory Visit | Attending: Cardiology | Admitting: Cardiology

## 2015-04-05 DIAGNOSIS — R9431 Abnormal electrocardiogram [ECG] [EKG]: Secondary | ICD-10-CM

## 2015-04-05 LAB — EXERCISE TOLERANCE TEST
CHL CUP STRESS STAGE 1 DBP: 82 mmHg
CHL CUP STRESS STAGE 1 SPEED: 0 mph
CHL CUP STRESS STAGE 2 GRADE: 0 %
CHL CUP STRESS STAGE 2 HR: 68 {beats}/min
CHL CUP STRESS STAGE 2 SPEED: 1 mph
CHL CUP STRESS STAGE 3 GRADE: 0.1 %
CHL CUP STRESS STAGE 4 HR: 100 {beats}/min
CHL CUP STRESS STAGE 5 HR: 121 {beats}/min
CHL CUP STRESS STAGE 6 SPEED: 0 mph
CHL CUP STRESS STAGE 7 GRADE: 0 %
CHL CUP STRESS STAGE 7 HR: 96 {beats}/min
CHL CUP STRESS STAGE 7 SBP: 185 mmHg
CHL CUP STRESS STAGE 7 SPEED: 0 mph
Estimated workload: 7 METS
Exercise duration (min): 5 min
MPHR: 147 {beats}/min
Peak BP: 139 mmHg
Peak HR: 121 {beats}/min
Percent HR: 85 %
Percent of predicted max HR: 82 %
RPE: 16
Rest HR: 62 {beats}/min
Stage 1 Grade: 0 %
Stage 1 HR: 64 {beats}/min
Stage 1 SBP: 118 mmHg
Stage 3 HR: 68 {beats}/min
Stage 3 Speed: 1 mph
Stage 4 DBP: 63 mmHg
Stage 4 Grade: 10 %
Stage 4 SBP: 107 mmHg
Stage 4 Speed: 1.7 mph
Stage 5 DBP: 87 mmHg
Stage 5 Grade: 12 %
Stage 5 SBP: 139 mmHg
Stage 5 Speed: 2.5 mph
Stage 6 DBP: 86 mmHg
Stage 6 Grade: 0 %
Stage 6 HR: 122 {beats}/min
Stage 6 SBP: 126 mmHg
Stage 7 DBP: 100 mmHg

## 2015-04-09 DIAGNOSIS — G8929 Other chronic pain: Secondary | ICD-10-CM | POA: Diagnosis not present

## 2015-04-09 DIAGNOSIS — M5136 Other intervertebral disc degeneration, lumbar region: Secondary | ICD-10-CM | POA: Diagnosis not present

## 2015-04-09 DIAGNOSIS — M5441 Lumbago with sciatica, right side: Secondary | ICD-10-CM | POA: Diagnosis not present

## 2015-04-09 DIAGNOSIS — G894 Chronic pain syndrome: Secondary | ICD-10-CM | POA: Diagnosis not present

## 2015-04-27 DIAGNOSIS — M272 Inflammatory conditions of jaws: Secondary | ICD-10-CM | POA: Diagnosis not present

## 2015-04-27 DIAGNOSIS — Z880 Allergy status to penicillin: Secondary | ICD-10-CM | POA: Diagnosis not present

## 2015-04-28 ENCOUNTER — Other Ambulatory Visit: Payer: Self-pay | Admitting: Family

## 2015-04-30 ENCOUNTER — Ambulatory Visit (INDEPENDENT_AMBULATORY_CARE_PROVIDER_SITE_OTHER): Payer: Medicare Other

## 2015-04-30 ENCOUNTER — Ambulatory Visit (INDEPENDENT_AMBULATORY_CARE_PROVIDER_SITE_OTHER): Payer: Medicare Other | Admitting: Family

## 2015-04-30 ENCOUNTER — Encounter: Payer: Self-pay | Admitting: Family

## 2015-04-30 VITALS — BP 132/80 | HR 67 | Temp 96.4°F | Ht 67.5 in | Wt 224.0 lb

## 2015-04-30 DIAGNOSIS — H7013 Chronic mastoiditis, bilateral: Secondary | ICD-10-CM

## 2015-04-30 DIAGNOSIS — Z01818 Encounter for other preprocedural examination: Secondary | ICD-10-CM

## 2015-04-30 DIAGNOSIS — M272 Inflammatory conditions of jaws: Secondary | ICD-10-CM | POA: Diagnosis not present

## 2015-04-30 DIAGNOSIS — K089 Disorder of teeth and supporting structures, unspecified: Secondary | ICD-10-CM | POA: Diagnosis not present

## 2015-04-30 MED ORDER — CITALOPRAM HYDROBROMIDE 20 MG PO TABS: ORAL_TABLET | ORAL | Status: DC

## 2015-04-30 NOTE — Progress Notes (Signed)
   Subjective:    Patient ID: Marcus Carr, male    DOB: 08/17/1940, 75 y.o.   MRN: [id number]  HPI PT presents to the office today for surgical clearance. Pt has osteomyelitis of mandible, chronic mastoiditis and is planning to have surgery on his jaw bone to remove the "infection and maybe strengthen the bone". Pt states he does have a date for surgery at this time, but is seeing Dr. Arnetha Courser DDS, oral surgeon. Pt seen a cardiologists on 04/05/15 and had a negative stress test. Pt states he was told her was cleared for surgery from a cardiac standpoint. PT is also followed by Infection Disease who is working with patient to have IV antibiotics before and after surgery. PT see Dr. Idelle Crouch from [facility].    Review of Systems  Constitutional: Negative.   HENT: Negative.   Respiratory: Negative.   Cardiovascular: Negative.   Gastrointestinal: Negative.   Endocrine: Negative.   Genitourinary: Negative.   Musculoskeletal: Negative.   Neurological: Negative.   Hematological: Negative.   Psychiatric/Behavioral: Negative.   All other systems reviewed and are negative.      Objective:   Physical Exam  Constitutional: He is oriented to person, place, and time. He appears well-developed and well-nourished. No distress.  HENT:  Head: Normocephalic.  Mouth/Throat: Abnormal dentition. Dental abscesses and dental caries present.  Eyes: Pupils are equal, round, and reactive to light. Right eye exhibits no discharge. Left eye exhibits no discharge.  Neck: Normal range of motion. Neck supple. No thyromegaly present.  Cardiovascular: Normal rate, regular rhythm, normal heart sounds and intact distal pulses.   No murmur heard. Pulmonary/Chest: Effort normal and breath sounds normal. No respiratory distress. He has no wheezes.  Abdominal: Soft. Bowel sounds are normal. He exhibits no distension. There is no tenderness.  Musculoskeletal: Normal range of motion. He exhibits no edema  or tenderness.  Neurological: He is alert and oriented to person, place, and time. He has normal reflexes. No cranial nerve deficit.  Skin: Skin is warm and dry. No rash noted. No erythema.  Abscess under chin with no redness or drainage presents at this time  Psychiatric: He has a normal mood and affect. His behavior is normal. Judgment and thought content normal.  Vitals reviewed.   BP 132/80 mmHg  Pulse 67  Temp(Src) 96.4 F (35.8 C) (Oral)  Ht 5' 7.5" (1.715 m)  Wt 224 lb (101.606 kg)  BMI 34.55 kg/m2  Chest- Xray- WNL Preliminary reading by [name], FNP [facility]       Assessment & Plan:  1. Pre-op exam - DG Chest 2 View; Future - EKG 12-Lead - CMP14+EGFR - CBC with Differential/Platelet  2. Poor dentition - CMP14+EGFR - CBC with Differential/Platelet  3. Osteomyelitis of mandible - CMP14+EGFR - CBC with Differential/Platelet  4. Chronic mastoiditis of both sides - CMP14+EGFR - CBC with Differential/Platelet  5. Abscess, jaw - CMP14+EGFR - CBC with Differential/Platelet  Labs pending Keep all appointments with Oral Surgeon and Infection Disease  RTO prn   [name], FNP

## 2015-04-30 NOTE — Patient Instructions (Signed)
Dental Abscess A dental abscess is pus in or around a tooth. HOME CARE  Take medicines only as told by your dentist.  If you were prescribed antibiotic medicine, finish all of it even if you start to feel better.  Rinse your mouth (gargle) often with salt water.  Do not drive or use heavy machinery, like a lawn mower, while taking pain medicine.  Do not apply heat to the outside of your mouth.  Keep all follow-up visits as told by your dentist. This is important. GET HELP IF:  Your pain is worse, and medicine does not help. GET HELP RIGHT AWAY IF:  You have a fever or chills.  Your symptoms suddenly get worse.  You have a very bad headache.  You have problems breathing or swallowing.  You have trouble opening your mouth.  You have puffiness (swelling) in your neck or around your eye.   This information is not intended to replace advice given to you by your health care provider. Make sure you discuss any questions you have with your health care provider.   Document Released: 07/25/2014 Document Reviewed: 07/25/2014 Elsevier Interactive Patient Education 2016 [employer]  

## 2015-05-01 ENCOUNTER — Encounter: Payer: Self-pay | Admitting: *Deleted

## 2015-05-01 LAB — CBC WITH DIFFERENTIAL/PLATELET
BASOS ABS: 0 10*3/uL (ref 0.0–0.2)
BASOS: 0 %
EOS (ABSOLUTE): 0.1 10*3/uL (ref 0.0–0.4)
Eos: 2 %
HEMOGLOBIN: 13.1 g/dL (ref 12.6–17.7)
Hematocrit: 38.3 % (ref 37.5–51.0)
IMMATURE GRANS (ABS): 0 10*3/uL (ref 0.0–0.1)
IMMATURE GRANULOCYTES: 0 %
LYMPHS: 37 %
Lymphocytes Absolute: 1.5 10*3/uL (ref 0.7–3.1)
MCH: 29.7 pg (ref 26.6–33.0)
MCHC: 34.2 g/dL (ref 31.5–35.7)
MCV: 87 fL (ref 79–97)
MONOCYTES: 8 %
Monocytes Absolute: 0.3 10*3/uL (ref 0.1–0.9)
NEUTROS ABS: 2.1 10*3/uL (ref 1.4–7.0)
NEUTROS PCT: 53 %
PLATELETS: 126 10*3/uL — AB (ref 150–379)
RBC: 4.41 x10E6/uL (ref 4.14–5.80)
RDW: 13.2 % (ref 12.3–15.4)
WBC: 4 10*3/uL (ref 3.4–10.8)

## 2015-05-01 LAB — CMP14+EGFR
ALT: 15 IU/L (ref 0–44)
AST: 21 IU/L (ref 0–40)
Albumin/Globulin Ratio: 1.8 (ref 1.1–2.5)
Albumin: 4.4 g/dL (ref 3.5–4.8)
Alkaline Phosphatase: 91 IU/L (ref 39–117)
BUN/Creatinine Ratio: 16 (ref 10–22)
BUN: 17 mg/dL (ref 8–27)
Bilirubin Total: 0.6 mg/dL (ref 0.0–1.2)
CALCIUM: 9.1 mg/dL (ref 8.6–10.2)
CHLORIDE: 101 mmol/L (ref 96–106)
CO2: 28 mmol/L (ref 18–29)
Creatinine, Ser: 1.08 mg/dL (ref 0.76–1.27)
GFR, EST AFRICAN AMERICAN: 78 mL/min/{1.73_m2} (ref >59)
GFR, EST NON AFRICAN AMERICAN: 68 mL/min/{1.73_m2} (ref >59)
GLUCOSE: 101 mg/dL — AB (ref 65–99)
Globulin, Total: 2.4 g/dL (ref 1.5–4.5)
POTASSIUM: 4.9 mmol/L (ref 3.5–5.2)
Sodium: 143 mmol/L (ref 134–144)
TOTAL PROTEIN: 6.8 g/dL (ref 6.0–8.5)

## 2015-05-02 DIAGNOSIS — K122 Cellulitis and abscess of mouth: Secondary | ICD-10-CM | POA: Diagnosis not present

## 2015-05-02 DIAGNOSIS — Z452 Encounter for adjustment and management of vascular access device: Secondary | ICD-10-CM | POA: Diagnosis not present

## 2015-05-02 DIAGNOSIS — M272 Inflammatory conditions of jaws: Secondary | ICD-10-CM | POA: Diagnosis not present

## 2015-05-03 DIAGNOSIS — M272 Inflammatory conditions of jaws: Secondary | ICD-10-CM | POA: Diagnosis not present

## 2015-05-04 DIAGNOSIS — M272 Inflammatory conditions of jaws: Secondary | ICD-10-CM | POA: Diagnosis not present

## 2015-05-06 DIAGNOSIS — K122 Cellulitis and abscess of mouth: Secondary | ICD-10-CM | POA: Diagnosis not present

## 2015-05-06 DIAGNOSIS — M272 Inflammatory conditions of jaws: Secondary | ICD-10-CM | POA: Diagnosis not present

## 2015-05-09 DIAGNOSIS — I10 Essential (primary) hypertension: Secondary | ICD-10-CM | POA: Diagnosis not present

## 2015-05-09 DIAGNOSIS — E785 Hyperlipidemia, unspecified: Secondary | ICD-10-CM | POA: Diagnosis not present

## 2015-05-09 DIAGNOSIS — M8788 Other osteonecrosis, other site: Secondary | ICD-10-CM | POA: Diagnosis not present

## 2015-05-09 DIAGNOSIS — M879 Osteonecrosis, unspecified: Secondary | ICD-10-CM | POA: Diagnosis not present

## 2015-05-09 DIAGNOSIS — M272 Inflammatory conditions of jaws: Secondary | ICD-10-CM | POA: Diagnosis not present

## 2015-05-09 DIAGNOSIS — Z79899 Other long term (current) drug therapy: Secondary | ICD-10-CM | POA: Diagnosis not present

## 2015-05-09 DIAGNOSIS — H7013 Chronic mastoiditis, bilateral: Secondary | ICD-10-CM | POA: Diagnosis not present

## 2015-05-09 DIAGNOSIS — Z87891 Personal history of nicotine dependence: Secondary | ICD-10-CM | POA: Diagnosis not present

## 2015-05-09 DIAGNOSIS — Z01818 Encounter for other preprocedural examination: Secondary | ICD-10-CM | POA: Diagnosis not present

## 2015-05-09 DIAGNOSIS — M8618 Other acute osteomyelitis, other site: Secondary | ICD-10-CM | POA: Diagnosis not present

## 2015-05-10 ENCOUNTER — Other Ambulatory Visit: Payer: Self-pay | Admitting: Family

## 2015-05-10 NOTE — Telephone Encounter (Signed)
Last lipid 01/2014

## 2015-05-12 DIAGNOSIS — K122 Cellulitis and abscess of mouth: Secondary | ICD-10-CM | POA: Diagnosis not present

## 2015-05-13 DIAGNOSIS — M272 Inflammatory conditions of jaws: Secondary | ICD-10-CM | POA: Diagnosis not present

## 2015-05-14 ENCOUNTER — Encounter (HOSPITAL_BASED_OUTPATIENT_CLINIC_OR_DEPARTMENT_OTHER): Payer: Medicare Other | Attending: Internal Medicine

## 2015-05-14 DIAGNOSIS — M272 Inflammatory conditions of jaws: Secondary | ICD-10-CM | POA: Diagnosis not present

## 2015-05-14 DIAGNOSIS — F17210 Nicotine dependence, cigarettes, uncomplicated: Secondary | ICD-10-CM | POA: Diagnosis not present

## 2015-05-14 DIAGNOSIS — M8648 Chronic osteomyelitis with draining sinus, other site: Secondary | ICD-10-CM | POA: Diagnosis not present

## 2015-05-14 DIAGNOSIS — M879 Osteonecrosis, unspecified: Secondary | ICD-10-CM | POA: Diagnosis not present

## 2015-05-18 DIAGNOSIS — Z880 Allergy status to penicillin: Secondary | ICD-10-CM | POA: Diagnosis not present

## 2015-05-18 DIAGNOSIS — M272 Inflammatory conditions of jaws: Secondary | ICD-10-CM | POA: Diagnosis not present

## 2015-05-20 DIAGNOSIS — M272 Inflammatory conditions of jaws: Secondary | ICD-10-CM | POA: Diagnosis not present

## 2015-05-21 DIAGNOSIS — M879 Osteonecrosis, unspecified: Secondary | ICD-10-CM | POA: Diagnosis not present

## 2015-05-21 DIAGNOSIS — M8648 Chronic osteomyelitis with draining sinus, other site: Secondary | ICD-10-CM | POA: Diagnosis not present

## 2015-05-21 DIAGNOSIS — M272 Inflammatory conditions of jaws: Secondary | ICD-10-CM | POA: Diagnosis not present

## 2015-05-22 ENCOUNTER — Encounter (INDEPENDENT_AMBULATORY_CARE_PROVIDER_SITE_OTHER): Payer: Medicare Other | Admitting: Family Medicine

## 2015-05-22 DIAGNOSIS — M879 Osteonecrosis, unspecified: Secondary | ICD-10-CM | POA: Diagnosis not present

## 2015-05-22 DIAGNOSIS — Z5181 Encounter for therapeutic drug level monitoring: Secondary | ICD-10-CM | POA: Diagnosis not present

## 2015-05-22 DIAGNOSIS — Z452 Encounter for adjustment and management of vascular access device: Secondary | ICD-10-CM | POA: Diagnosis not present

## 2015-05-22 DIAGNOSIS — M8668 Other chronic osteomyelitis, other site: Secondary | ICD-10-CM | POA: Diagnosis not present

## 2015-05-22 DIAGNOSIS — M272 Inflammatory conditions of jaws: Secondary | ICD-10-CM

## 2015-05-23 ENCOUNTER — Encounter (HOSPITAL_BASED_OUTPATIENT_CLINIC_OR_DEPARTMENT_OTHER): Payer: Medicare Other | Attending: Surgery

## 2015-05-23 DIAGNOSIS — M272 Inflammatory conditions of jaws: Secondary | ICD-10-CM | POA: Insufficient documentation

## 2015-05-23 DIAGNOSIS — M8648 Chronic osteomyelitis with draining sinus, other site: Secondary | ICD-10-CM | POA: Diagnosis not present

## 2015-05-24 DIAGNOSIS — M272 Inflammatory conditions of jaws: Secondary | ICD-10-CM | POA: Diagnosis not present

## 2015-05-24 DIAGNOSIS — M8648 Chronic osteomyelitis with draining sinus, other site: Secondary | ICD-10-CM | POA: Diagnosis not present

## 2015-05-25 DIAGNOSIS — M272 Inflammatory conditions of jaws: Secondary | ICD-10-CM | POA: Diagnosis not present

## 2015-05-25 DIAGNOSIS — M8648 Chronic osteomyelitis with draining sinus, other site: Secondary | ICD-10-CM | POA: Diagnosis not present

## 2015-05-26 DIAGNOSIS — K122 Cellulitis and abscess of mouth: Secondary | ICD-10-CM | POA: Diagnosis not present

## 2015-05-27 DIAGNOSIS — M272 Inflammatory conditions of jaws: Secondary | ICD-10-CM | POA: Diagnosis not present

## 2015-05-28 DIAGNOSIS — M272 Inflammatory conditions of jaws: Secondary | ICD-10-CM | POA: Diagnosis not present

## 2015-05-28 DIAGNOSIS — M8648 Chronic osteomyelitis with draining sinus, other site: Secondary | ICD-10-CM | POA: Diagnosis not present

## 2015-05-29 DIAGNOSIS — M272 Inflammatory conditions of jaws: Secondary | ICD-10-CM | POA: Diagnosis not present

## 2015-05-29 DIAGNOSIS — M8648 Chronic osteomyelitis with draining sinus, other site: Secondary | ICD-10-CM | POA: Diagnosis not present

## 2015-05-30 DIAGNOSIS — M8648 Chronic osteomyelitis with draining sinus, other site: Secondary | ICD-10-CM | POA: Diagnosis not present

## 2015-05-30 DIAGNOSIS — M272 Inflammatory conditions of jaws: Secondary | ICD-10-CM | POA: Diagnosis not present

## 2015-05-31 DIAGNOSIS — M272 Inflammatory conditions of jaws: Secondary | ICD-10-CM | POA: Diagnosis not present

## 2015-05-31 DIAGNOSIS — M8648 Chronic osteomyelitis with draining sinus, other site: Secondary | ICD-10-CM | POA: Diagnosis not present

## 2015-06-01 DIAGNOSIS — M272 Inflammatory conditions of jaws: Secondary | ICD-10-CM | POA: Diagnosis not present

## 2015-06-01 DIAGNOSIS — M8648 Chronic osteomyelitis with draining sinus, other site: Secondary | ICD-10-CM | POA: Diagnosis not present

## 2015-06-03 DIAGNOSIS — M272 Inflammatory conditions of jaws: Secondary | ICD-10-CM | POA: Diagnosis not present

## 2015-06-04 DIAGNOSIS — M8648 Chronic osteomyelitis with draining sinus, other site: Secondary | ICD-10-CM | POA: Diagnosis not present

## 2015-06-04 DIAGNOSIS — M272 Inflammatory conditions of jaws: Secondary | ICD-10-CM | POA: Diagnosis not present

## 2015-06-05 DIAGNOSIS — M272 Inflammatory conditions of jaws: Secondary | ICD-10-CM | POA: Diagnosis not present

## 2015-06-05 DIAGNOSIS — M8648 Chronic osteomyelitis with draining sinus, other site: Secondary | ICD-10-CM | POA: Diagnosis not present

## 2015-06-06 DIAGNOSIS — M272 Inflammatory conditions of jaws: Secondary | ICD-10-CM | POA: Diagnosis not present

## 2015-06-06 DIAGNOSIS — M8648 Chronic osteomyelitis with draining sinus, other site: Secondary | ICD-10-CM | POA: Diagnosis not present

## 2015-06-07 DIAGNOSIS — M272 Inflammatory conditions of jaws: Secondary | ICD-10-CM | POA: Diagnosis not present

## 2015-06-07 DIAGNOSIS — M8648 Chronic osteomyelitis with draining sinus, other site: Secondary | ICD-10-CM | POA: Diagnosis not present

## 2015-06-08 DIAGNOSIS — M272 Inflammatory conditions of jaws: Secondary | ICD-10-CM | POA: Diagnosis not present

## 2015-06-08 DIAGNOSIS — M8648 Chronic osteomyelitis with draining sinus, other site: Secondary | ICD-10-CM | POA: Diagnosis not present

## 2015-06-08 DIAGNOSIS — M8668 Other chronic osteomyelitis, other site: Secondary | ICD-10-CM | POA: Diagnosis not present

## 2015-06-09 DIAGNOSIS — K122 Cellulitis and abscess of mouth: Secondary | ICD-10-CM | POA: Diagnosis not present

## 2015-06-10 DIAGNOSIS — M272 Inflammatory conditions of jaws: Secondary | ICD-10-CM | POA: Diagnosis not present

## 2015-06-10 DIAGNOSIS — Z452 Encounter for adjustment and management of vascular access device: Secondary | ICD-10-CM | POA: Diagnosis not present

## 2015-06-11 DIAGNOSIS — M272 Inflammatory conditions of jaws: Secondary | ICD-10-CM | POA: Diagnosis not present

## 2015-06-11 DIAGNOSIS — M8648 Chronic osteomyelitis with draining sinus, other site: Secondary | ICD-10-CM | POA: Diagnosis not present

## 2015-06-12 DIAGNOSIS — Z880 Allergy status to penicillin: Secondary | ICD-10-CM | POA: Diagnosis not present

## 2015-06-12 DIAGNOSIS — M272 Inflammatory conditions of jaws: Secondary | ICD-10-CM | POA: Diagnosis not present

## 2015-06-13 DIAGNOSIS — M272 Inflammatory conditions of jaws: Secondary | ICD-10-CM | POA: Diagnosis not present

## 2015-06-13 DIAGNOSIS — M8648 Chronic osteomyelitis with draining sinus, other site: Secondary | ICD-10-CM | POA: Diagnosis not present

## 2015-06-14 ENCOUNTER — Other Ambulatory Visit: Payer: Self-pay | Admitting: Family

## 2015-06-18 DIAGNOSIS — M8648 Chronic osteomyelitis with draining sinus, other site: Secondary | ICD-10-CM | POA: Diagnosis not present

## 2015-06-18 DIAGNOSIS — M272 Inflammatory conditions of jaws: Secondary | ICD-10-CM | POA: Diagnosis not present

## 2015-06-19 DIAGNOSIS — M272 Inflammatory conditions of jaws: Secondary | ICD-10-CM | POA: Diagnosis not present

## 2015-06-19 DIAGNOSIS — M8648 Chronic osteomyelitis with draining sinus, other site: Secondary | ICD-10-CM | POA: Diagnosis not present

## 2015-06-20 DIAGNOSIS — M272 Inflammatory conditions of jaws: Secondary | ICD-10-CM | POA: Diagnosis not present

## 2015-06-20 DIAGNOSIS — M8648 Chronic osteomyelitis with draining sinus, other site: Secondary | ICD-10-CM | POA: Diagnosis not present

## 2015-06-21 DIAGNOSIS — M272 Inflammatory conditions of jaws: Secondary | ICD-10-CM | POA: Diagnosis not present

## 2015-06-21 DIAGNOSIS — M278 Other specified diseases of jaws: Secondary | ICD-10-CM | POA: Diagnosis not present

## 2015-06-21 DIAGNOSIS — M8668 Other chronic osteomyelitis, other site: Secondary | ICD-10-CM | POA: Diagnosis not present

## 2015-06-22 DIAGNOSIS — M272 Inflammatory conditions of jaws: Secondary | ICD-10-CM | POA: Diagnosis not present

## 2015-06-22 DIAGNOSIS — M278 Other specified diseases of jaws: Secondary | ICD-10-CM | POA: Diagnosis not present

## 2015-06-22 DIAGNOSIS — M8668 Other chronic osteomyelitis, other site: Secondary | ICD-10-CM | POA: Diagnosis not present

## 2015-06-25 ENCOUNTER — Encounter (HOSPITAL_BASED_OUTPATIENT_CLINIC_OR_DEPARTMENT_OTHER): Payer: Medicare Other | Attending: Internal Medicine

## 2015-06-25 DIAGNOSIS — M278 Other specified diseases of jaws: Secondary | ICD-10-CM | POA: Diagnosis not present

## 2015-06-25 DIAGNOSIS — M8668 Other chronic osteomyelitis, other site: Secondary | ICD-10-CM | POA: Diagnosis not present

## 2015-06-25 DIAGNOSIS — M272 Inflammatory conditions of jaws: Secondary | ICD-10-CM | POA: Insufficient documentation

## 2015-06-26 DIAGNOSIS — M8648 Chronic osteomyelitis with draining sinus, other site: Secondary | ICD-10-CM | POA: Diagnosis not present

## 2015-06-26 DIAGNOSIS — M272 Inflammatory conditions of jaws: Secondary | ICD-10-CM | POA: Diagnosis not present

## 2015-06-27 DIAGNOSIS — M272 Inflammatory conditions of jaws: Secondary | ICD-10-CM | POA: Diagnosis not present

## 2015-06-27 DIAGNOSIS — M8648 Chronic osteomyelitis with draining sinus, other site: Secondary | ICD-10-CM | POA: Diagnosis not present

## 2015-06-28 DIAGNOSIS — M278 Other specified diseases of jaws: Secondary | ICD-10-CM | POA: Diagnosis not present

## 2015-06-28 DIAGNOSIS — M8668 Other chronic osteomyelitis, other site: Secondary | ICD-10-CM | POA: Diagnosis not present

## 2015-06-28 DIAGNOSIS — M272 Inflammatory conditions of jaws: Secondary | ICD-10-CM | POA: Diagnosis not present

## 2015-06-29 DIAGNOSIS — Z872 Personal history of diseases of the skin and subcutaneous tissue: Secondary | ICD-10-CM | POA: Diagnosis not present

## 2015-06-29 DIAGNOSIS — M278 Other specified diseases of jaws: Secondary | ICD-10-CM | POA: Diagnosis not present

## 2015-06-29 DIAGNOSIS — M8668 Other chronic osteomyelitis, other site: Secondary | ICD-10-CM | POA: Diagnosis not present

## 2015-06-29 DIAGNOSIS — M272 Inflammatory conditions of jaws: Secondary | ICD-10-CM | POA: Diagnosis not present

## 2015-06-29 DIAGNOSIS — Z09 Encounter for follow-up examination after completed treatment for conditions other than malignant neoplasm: Secondary | ICD-10-CM | POA: Diagnosis not present

## 2015-07-01 ENCOUNTER — Other Ambulatory Visit: Payer: Self-pay | Admitting: Family

## 2015-07-02 DIAGNOSIS — M272 Inflammatory conditions of jaws: Secondary | ICD-10-CM | POA: Diagnosis not present

## 2015-07-02 DIAGNOSIS — M278 Other specified diseases of jaws: Secondary | ICD-10-CM | POA: Diagnosis not present

## 2015-07-02 DIAGNOSIS — M8668 Other chronic osteomyelitis, other site: Secondary | ICD-10-CM | POA: Diagnosis not present

## 2015-07-02 NOTE — Telephone Encounter (Signed)
Last lipid 01/2014

## 2015-07-03 DIAGNOSIS — M272 Inflammatory conditions of jaws: Secondary | ICD-10-CM | POA: Diagnosis not present

## 2015-07-03 DIAGNOSIS — M8668 Other chronic osteomyelitis, other site: Secondary | ICD-10-CM | POA: Diagnosis not present

## 2015-07-04 DIAGNOSIS — M8668 Other chronic osteomyelitis, other site: Secondary | ICD-10-CM | POA: Diagnosis not present

## 2015-07-04 DIAGNOSIS — M272 Inflammatory conditions of jaws: Secondary | ICD-10-CM | POA: Diagnosis not present

## 2015-07-23 ENCOUNTER — Other Ambulatory Visit: Payer: Self-pay | Admitting: Family

## 2015-08-07 ENCOUNTER — Other Ambulatory Visit: Payer: Self-pay | Admitting: Family

## 2015-08-07 NOTE — Telephone Encounter (Signed)
Last seen 04/30/15  Marcus Carr  Last lipid 02/06/14

## 2015-08-23 DIAGNOSIS — G8929 Other chronic pain: Secondary | ICD-10-CM | POA: Diagnosis not present

## 2015-08-23 DIAGNOSIS — M5136 Other intervertebral disc degeneration, lumbar region: Secondary | ICD-10-CM | POA: Diagnosis not present

## 2015-08-23 DIAGNOSIS — M5441 Lumbago with sciatica, right side: Secondary | ICD-10-CM | POA: Diagnosis not present

## 2015-08-23 DIAGNOSIS — G894 Chronic pain syndrome: Secondary | ICD-10-CM | POA: Diagnosis not present

## 2015-09-04 DIAGNOSIS — H40033 Anatomical narrow angle, bilateral: Secondary | ICD-10-CM | POA: Diagnosis not present

## 2015-09-04 DIAGNOSIS — H1013 Acute atopic conjunctivitis, bilateral: Secondary | ICD-10-CM | POA: Diagnosis not present

## 2015-09-26 ENCOUNTER — Other Ambulatory Visit: Payer: Self-pay | Admitting: Family

## 2015-10-24 ENCOUNTER — Ambulatory Visit (INDEPENDENT_AMBULATORY_CARE_PROVIDER_SITE_OTHER): Payer: Medicare Other | Admitting: Family

## 2015-10-24 ENCOUNTER — Encounter: Payer: Self-pay | Admitting: Family

## 2015-10-24 VITALS — BP 138/87 | HR 73 | Ht 67.5 in | Wt 251.0 lb

## 2015-10-24 DIAGNOSIS — N183 Chronic kidney disease, stage 3 unspecified: Secondary | ICD-10-CM

## 2015-10-24 DIAGNOSIS — E8881 Metabolic syndrome: Secondary | ICD-10-CM

## 2015-10-24 DIAGNOSIS — Z23 Encounter for immunization: Secondary | ICD-10-CM

## 2015-10-24 DIAGNOSIS — E785 Hyperlipidemia, unspecified: Secondary | ICD-10-CM

## 2015-10-24 DIAGNOSIS — F419 Anxiety disorder, unspecified: Secondary | ICD-10-CM | POA: Diagnosis not present

## 2015-10-24 DIAGNOSIS — M109 Gout, unspecified: Secondary | ICD-10-CM | POA: Diagnosis not present

## 2015-10-24 DIAGNOSIS — M549 Dorsalgia, unspecified: Secondary | ICD-10-CM

## 2015-10-24 DIAGNOSIS — M272 Inflammatory conditions of jaws: Secondary | ICD-10-CM | POA: Diagnosis not present

## 2015-10-24 DIAGNOSIS — Z720 Tobacco use: Secondary | ICD-10-CM

## 2015-10-24 DIAGNOSIS — Z1211 Encounter for screening for malignant neoplasm of colon: Secondary | ICD-10-CM

## 2015-10-24 DIAGNOSIS — I10 Essential (primary) hypertension: Secondary | ICD-10-CM

## 2015-10-24 DIAGNOSIS — F17210 Nicotine dependence, cigarettes, uncomplicated: Secondary | ICD-10-CM

## 2015-10-24 NOTE — Patient Instructions (Signed)

## 2015-10-24 NOTE — Addendum Note (Signed)
Addended by: Shelbie Ammons on: 10/24/2015 10:51 AM   Modules accepted: Orders

## 2015-10-24 NOTE — Progress Notes (Signed)
Subjective:    Patient ID: Marcus Carr, male    DOB: 02/17/1941, 75 y.o.   MRN: [id number]  Pt presents to the office today for chronic follow up. PT has history of osteomyelitis of mandible. Pt states he last surgery was "a few months ago". Pt can not recall the exact date, but states he is doing well and everything is healed. Medication Refill  Pertinent negatives include no headaches or numbness.  Hypertension  This is a chronic problem. The current episode started more than 1 year ago. The problem has been waxing and waning since onset. The problem is controlled. Associated symptoms include anxiety. Pertinent negatives include no headaches, palpitations, peripheral edema or shortness of breath. Risk factors for coronary artery disease include obesity, male gender, sedentary lifestyle, dyslipidemia and family history. Past treatments include calcium channel blockers. The current treatment provides mild improvement. Hypertensive end-organ damage includes kidney disease. There is no history of CAD/MI, CVA or heart failure.  Hyperlipidemia  This is a chronic problem. The current episode started more than 1 year ago. The problem is controlled. Recent lipid tests were reviewed and are normal. He has no history of diabetes. Pertinent negatives include no leg pain or shortness of breath. Current antihyperlipidemic treatment includes statins and nicotinic acid. The current treatment provides moderate improvement of lipids. Risk factors for coronary artery disease include a sedentary lifestyle, obesity, hypertension, male sex, family history and dyslipidemia.  Anxiety  Presents for follow-up visit. Patient reports no depressed mood, excessive worry, irritability, nervous/anxious behavior, palpitations or shortness of breath. Symptoms occur rarely. The severity of symptoms is mild. The quality of sleep is good.    Back Pain  This is a chronic problem. The current episode started more than 1 year  ago. The problem occurs intermittently. The problem is unchanged. The pain is present in the lumbar spine. The quality of the pain is described as aching. The pain is at a severity of 3/10. The pain is moderate. The symptoms are aggravated by bending and standing. Pertinent negatives include no bladder incontinence, bowel incontinence, dysuria, headaches, leg pain, numbness or tingling. Risk factors include obesity. He has tried analgesics and bed rest for the symptoms. The treatment provided mild relief.  Gout PT currently taking allopurinol 300 mg every day. Pt states it has been years since his last "gout flare up". Metabolic Syndrome Pt currently taking Lipitor 20 mg and niacin daily. PT states he has gained over 30 lbs since stopping smoking in 10/2014.   Review of Systems  Constitutional: Negative.  Negative for irritability.  HENT: Negative.   Respiratory: Negative.  Negative for shortness of breath.   Cardiovascular: Negative.  Negative for palpitations.  Gastrointestinal: Negative.  Negative for bowel incontinence.  Endocrine: Negative.   Genitourinary: Negative.  Negative for bladder incontinence and dysuria.  Musculoskeletal: Positive for back pain.  Neurological: Negative.  Negative for tingling, numbness and headaches.  Hematological: Negative.   Psychiatric/Behavioral: Negative.  The patient is not nervous/anxious.   All other systems reviewed and are negative.      Objective:   Physical Exam  Constitutional: He is oriented to person, place, and time. He appears well-developed and well-nourished. No distress.  HENT:  Head: Normocephalic.  Right Ear: External ear normal.  Left Ear: External ear normal.  Nose: Nose normal.  Mouth/Throat: Oropharynx is clear and moist. He has dentures (No teeth present).  Eyes: Pupils are equal, round, and reactive to light. Right eye exhibits no discharge. Left eye  exhibits no discharge.  Neck: Normal range of motion. Neck supple. No  thyromegaly present.  Cardiovascular: Normal rate, regular rhythm, normal heart sounds and intact distal pulses.   No murmur heard. Pulmonary/Chest: Effort normal and breath sounds normal. No respiratory distress. He has no wheezes.  Abdominal: Soft. Bowel sounds are normal. He exhibits no distension. There is no tenderness.  Musculoskeletal: Normal range of motion. He exhibits no edema or tenderness.  Neurological: He is alert and oriented to person, place, and time. He has normal reflexes. No cranial nerve deficit.  Skin: Skin is warm and dry. No rash noted. No erythema.  Psychiatric: He has a normal mood and affect. His behavior is normal. Judgment and thought content normal.  Vitals reviewed.     BP 138/87   Pulse 73   Ht 5' 7.5" (1.715 m)   Wt 251 lb (113.9 kg)   BMI 38.73 kg/m      Assessment & Plan:  1. Essential hypertension - CMP14+EGFR  2. Osteomyelitis of mandible - CMP14+EGFR  3. Chronic kidney disease, stage 3 (moderate) - CMP14+EGFR  4. Cigarette smoker -Pt quit  - CMP14+EGFR  5. Back pain, unspecified location - CMP14+EGFR  6. Anxiety - CMP14+EGFR  7. Morbid obesity, unspecified obesity type (HCC) - CMP14+EGFR  8. Hyperlipidemia -If cholesterol levels come back normal will stop niacin - CMP14+EGFR - Lipid panel  9. Metabolic syndrome - MMC37+VOHK  10. Gout of right foot, unspecified cause, unspecified chronicity -If Uric acid WNL, will stop allopurinol  - CMP14+EGFR - Uric acid  11. Colon cancer screening - CMP14+EGFR - Fecal occult blood, imunochemical; Future   Continue all meds Labs pending Health Maintenance reviewed- Prevnar given today Diet and exercise encouraged RTO 6 months  [name], FNP

## 2015-10-25 ENCOUNTER — Other Ambulatory Visit: Payer: Self-pay | Admitting: Family

## 2015-10-25 LAB — CMP14+EGFR
ALBUMIN: 4.4 g/dL (ref 3.5–4.8)
ALK PHOS: 87 IU/L (ref 39–117)
ALT: 22 IU/L (ref 0–44)
AST: 32 IU/L (ref 0–40)
Albumin/Globulin Ratio: 1.6 (ref 1.2–2.2)
BUN / CREAT RATIO: 19 (ref 10–24)
BUN: 22 mg/dL (ref 8–27)
Bilirubin Total: 0.5 mg/dL (ref 0.0–1.2)
CO2: 26 mmol/L (ref 18–29)
CREATININE: 1.13 mg/dL (ref 0.76–1.27)
Calcium: 9.4 mg/dL (ref 8.6–10.2)
Chloride: 100 mmol/L (ref 96–106)
GFR calc non Af Amer: 64 mL/min/{1.73_m2} (ref >59)
GFR, EST AFRICAN AMERICAN: 74 mL/min/{1.73_m2} (ref >59)
GLOBULIN, TOTAL: 2.8 g/dL (ref 1.5–4.5)
Glucose: 120 mg/dL — ABNORMAL HIGH (ref 65–99)
Potassium: 4.7 mmol/L (ref 3.5–5.2)
SODIUM: 141 mmol/L (ref 134–144)
Total Protein: 7.2 g/dL (ref 6.0–8.5)

## 2015-10-25 LAB — LIPID PANEL
CHOL/HDL RATIO: 2.5 ratio (ref 0.0–5.0)
CHOLESTEROL TOTAL: 121 mg/dL (ref 100–199)
HDL: 48 mg/dL (ref >39)
LDL CALC: 56 mg/dL (ref 0–99)
Triglycerides: 85 mg/dL (ref 0–149)
VLDL Cholesterol Cal: 17 mg/dL (ref 5–40)

## 2015-10-25 LAB — URIC ACID: URIC ACID: 6.3 mg/dL (ref 3.7–8.6)

## 2016-01-22 ENCOUNTER — Encounter: Payer: Self-pay | Admitting: Nurse Practitioner

## 2016-01-22 ENCOUNTER — Telehealth: Payer: Self-pay | Admitting: Family

## 2016-01-22 ENCOUNTER — Ambulatory Visit (INDEPENDENT_AMBULATORY_CARE_PROVIDER_SITE_OTHER): Payer: Medicare Other | Admitting: Nurse Practitioner

## 2016-01-22 VITALS — BP 144/86 | HR 65 | Temp 96.9°F | Ht 67.0 in | Wt 259.0 lb

## 2016-01-22 DIAGNOSIS — I10 Essential (primary) hypertension: Secondary | ICD-10-CM | POA: Diagnosis not present

## 2016-01-22 MED ORDER — HYDROCHLOROTHIAZIDE 25 MG PO TABS: 25.0000 mg | ORAL_TABLET | Freq: Every day | ORAL | 1 refills | Status: DC

## 2016-01-22 MED ORDER — HYDROCHLOROTHIAZIDE 12.5 MG PO CAPS: 12.5000 mg | ORAL_CAPSULE | Freq: Every day | ORAL | 5 refills | Status: DC

## 2016-01-22 NOTE — Progress Notes (Signed)
   Subjective:    Patient ID: Marcus Carr, male    DOB: [birth year], 75 y.o.   MRN: [id number]  HPI  Patient comes in today c/o high blod pressure. He usually sees C. Hawks. SHe currently has him on verapamil 240mg  daily- He went to hospital in August 2016 for chin surgery and they decreased him down to verapamil 180 mg CR. He has been on that for a year. Says taht he has gained weight and now bkood presure has increased. Blood pressure running 0000000 systolic. He denies ant headaches, visual changes, SOB or chest pain.  Review of Systems  Constitutional: Negative.   HENT: Negative.   Eyes: Negative.  Negative for visual disturbance.  Respiratory: Negative.   Cardiovascular: Negative.  Negative for chest pain, palpitations and leg swelling.  Gastrointestinal: Negative.   Genitourinary: Negative.   Neurological: Negative for dizziness and headaches.  Psychiatric/Behavioral: Negative.   All other systems reviewed and are negative.      Objective:   Physical Exam  Constitutional: He is oriented to person, place, and time. He appears well-developed and well-nourished.  Cardiovascular: Normal rate, regular rhythm and normal heart sounds.   Pulmonary/Chest: Effort normal and breath sounds normal.  Abdominal: Soft. Bowel sounds are normal.  Neurological: He is alert and oriented to person, place, and time.  Skin: Skin is warm.  Psychiatric: He has a normal mood and affect. His behavior is normal. Judgment and thought content normal.   BP (!) 144/86 (BP Location: Left Arm, Cuff Size: Normal)   Pulse 65   Temp (!) 96.9 F (36.1 C) (Oral)   Ht 5\' 7"  (1.702 m)   Wt 259 lb (117.5 kg)   BMI 40.57 kg/m         Assessment & Plan:  1. Essential hypertension Continue verapamil at current dose Added HCTZ 12.5 mg daily Keep diary of blood pressure at home Follow up in 1 week with C. Hawks to check blood pressure - hydrochlorothiazide (MICROZIDE) 12.5 MG capsule; Take 1 capsule (12.5  mg total) by mouth daily.  Dispense: 30 capsule; Refill: [place], FNP

## 2016-01-22 NOTE — Telephone Encounter (Signed)
HCTZ increased, pt need appt to be seen.

## 2016-01-22 NOTE — Patient Instructions (Signed)
Hypertension Hypertension, commonly called high blood pressure, is when the force of blood pumping through your arteries is too strong. Your arteries are the blood vessels that carry blood from your heart throughout your body. A blood pressure reading consists of a higher number over a lower number, such as 110/72. The higher number (systolic) is the pressure inside your arteries when your heart pumps. The lower number (diastolic) is the pressure inside your arteries when your heart relaxes. Ideally you want your blood pressure below 120/80. Hypertension forces your heart to work harder to pump blood. Your arteries may become narrow or stiff. Having untreated or uncontrolled hypertension can cause heart attack, stroke, kidney disease, and other problems. RISK FACTORS Some risk factors for high blood pressure are controllable. Others are not.  Risk factors you cannot control include:   Race. You may be at higher risk if you are African American.  Age. Risk increases with age.  Gender. Men are at higher risk than women before age 45 years. After age 65, women are at higher risk than men. Risk factors you can control include:  Not getting enough exercise or physical activity.  Being overweight.  Getting too much fat, sugar, calories, or salt in your diet.  Drinking too much alcohol. SIGNS AND SYMPTOMS Hypertension does not usually cause signs or symptoms. Extremely high blood pressure (hypertensive crisis) may cause headache, anxiety, shortness of breath, and nosebleed. DIAGNOSIS To check if you have hypertension, your health care provider will measure your blood pressure while you are seated, with your arm held at the level of your heart. It should be measured at least twice using the same arm. Certain conditions can cause a difference in blood pressure between your right and left arms. A blood pressure reading that is higher than normal on one occasion does not mean that you need treatment. If  it is not clear whether you have high blood pressure, you may be asked to return on a different day to have your blood pressure checked again. Or, you may be asked to monitor your blood pressure at home for 1 or more weeks. TREATMENT Treating high blood pressure includes making lifestyle changes and possibly taking medicine. Living a healthy lifestyle can help lower high blood pressure. You may need to change some of your habits. Lifestyle changes may include:  Following the DASH diet. This diet is high in fruits, vegetables, and whole grains. It is low in salt, red meat, and added sugars.  Keep your sodium intake below 2,300 mg per day.  Getting at least 30-45 minutes of aerobic exercise at least 4 times per week.  Losing weight if necessary.  Not smoking.  Limiting alcoholic beverages.  Learning ways to reduce stress. Your health care provider may prescribe medicine if lifestyle changes are not enough to get your blood pressure under control, and if one of the following is true:  You are 18-59 years of age and your systolic blood pressure is above 140.  You are 60 years of age or older, and your systolic blood pressure is above 150.  Your diastolic blood pressure is above 90.  You have diabetes, and your systolic blood pressure is over 140 or your diastolic blood pressure is over 90.  You have kidney disease and your blood pressure is above 140/90.  You have heart disease and your blood pressure is above 140/90. Your personal target blood pressure may vary depending on your medical conditions, your age, and other factors. HOME CARE INSTRUCTIONS    Have your blood pressure rechecked as directed by your health care provider.   Take medicines only as directed by your health care provider. Follow the directions carefully. Blood pressure medicines must be taken as prescribed. The medicine does not work as well when you skip doses. Skipping doses also puts you at risk for  problems.  Do not smoke.   Monitor your blood pressure at home as directed by your health care provider. SEEK MEDICAL CARE IF:   You think you are having a reaction to medicines taken.  You have recurrent headaches or feel dizzy.  You have swelling in your ankles.  You have trouble with your vision. SEEK IMMEDIATE MEDICAL CARE IF:  You develop a severe headache or confusion.  You have unusual weakness, numbness, or feel faint.  You have severe chest or abdominal pain.  You vomit repeatedly.  You have trouble breathing. MAKE SURE YOU:   Understand these instructions.  Will watch your condition.  Will get help right away if you are not doing well or get worse.   This information is not intended to replace advice given to you by your health care provider. Make sure you discuss any questions you have with your health care provider.   Document Released: 03/10/2005 Document Revised: 07/25/2014 Document Reviewed: 12/31/2012 Elsevier Interactive Patient Education 2016 [employer]  

## 2016-01-28 ENCOUNTER — Ambulatory Visit (INDEPENDENT_AMBULATORY_CARE_PROVIDER_SITE_OTHER): Payer: Medicare Other | Admitting: Family

## 2016-01-28 ENCOUNTER — Encounter: Payer: Self-pay | Admitting: Family

## 2016-01-28 VITALS — BP 127/74 | HR 67 | Temp 97.3°F | Ht 67.0 in | Wt 256.8 lb

## 2016-01-28 DIAGNOSIS — Z1211 Encounter for screening for malignant neoplasm of colon: Secondary | ICD-10-CM

## 2016-01-28 DIAGNOSIS — R7989 Other specified abnormal findings of blood chemistry: Secondary | ICD-10-CM | POA: Diagnosis not present

## 2016-01-28 DIAGNOSIS — I10 Essential (primary) hypertension: Secondary | ICD-10-CM | POA: Diagnosis not present

## 2016-01-28 NOTE — Progress Notes (Signed)
   Subjective:    Patient ID: Marcus Carr, male    DOB: 1941-01-04, 75 y.o.   MRN: [id number]  Pt presents to the office today to recheck HTN. PT's BP is at goal! Hypertension  This is a chronic problem. The current episode started more than 1 year ago. The problem has been resolved since onset. The problem is controlled. Pertinent negatives include no headaches, malaise/fatigue, peripheral edema or shortness of breath. Risk factors for coronary artery disease include male gender, obesity, sedentary lifestyle and family history. Past treatments include calcium channel blockers and diuretics. The current treatment provides mild improvement. Hypertensive end-organ damage includes CAD/MI. There is no history of kidney disease or heart failure.      Review of Systems  Constitutional: Negative for malaise/fatigue.  Respiratory: Negative for shortness of breath.   Neurological: Negative for headaches.  All other systems reviewed and are negative.      Objective:   Physical Exam  Constitutional: He is oriented to person, place, and time. He appears well-developed and well-nourished. No distress.  Eyes: Pupils are equal, round, and reactive to light.  Cardiovascular: Normal rate, regular rhythm, normal heart sounds and intact distal pulses.   No murmur heard. Pulmonary/Chest: Effort normal and breath sounds normal. No respiratory distress. He has no wheezes.  Abdominal: Soft. Bowel sounds are normal. He exhibits no distension. There is no tenderness.  Musculoskeletal: Normal range of motion. He exhibits no edema or tenderness.  Neurological: He is alert and oriented to person, place, and time.  Skin: Skin is warm and dry. No rash noted. No erythema.  Psychiatric: He has a normal mood and affect. His behavior is normal. Judgment and thought content normal.  Vitals reviewed.    BP 127/74   Pulse 67   Temp 97.3 F (36.3 C)   Ht '5\' 7"'$  (1.702 m)   Wt 256 lb 12.8 oz (116.5 kg)   BMI  40.22 kg/m       Assessment & Plan:  1. Essential hypertension --Daily blood pressure log given with instructions on how to fill out and told to bring to next visit -Dash diet information given -Exercise encouraged - Stress Management  -Continue current meds -RTO in 3 months - BMP8+EGFR  2. Colon cancer screening - Fecal occult blood, imunochemical; Future   Continue all meds Labs pending Health Maintenance reviewed Diet and exercise encouraged RTO 3 months  [name], FNP

## 2016-01-28 NOTE — Patient Instructions (Signed)
Hypertension Hypertension, commonly called high blood pressure, is when the force of blood pumping through your arteries is too strong. Your arteries are the blood vessels that carry blood from your heart throughout your body. A blood pressure reading consists of a higher number over a lower number, such as 110/72. The higher number (systolic) is the pressure inside your arteries when your heart pumps. The lower number (diastolic) is the pressure inside your arteries when your heart relaxes. Ideally you want your blood pressure below 120/80. Hypertension forces your heart to work harder to pump blood. Your arteries may become narrow or stiff. Having untreated or uncontrolled hypertension can cause heart attack, stroke, kidney disease, and other problems. RISK FACTORS Some risk factors for high blood pressure are controllable. Others are not.  Risk factors you cannot control include:   Race. You may be at higher risk if you are African American.  Age. Risk increases with age.  Gender. Men are at higher risk than women before age 45 years. After age 65, women are at higher risk than men. Risk factors you can control include:  Not getting enough exercise or physical activity.  Being overweight.  Getting too much fat, sugar, calories, or salt in your diet.  Drinking too much alcohol. SIGNS AND SYMPTOMS Hypertension does not usually cause signs or symptoms. Extremely high blood pressure (hypertensive crisis) may cause headache, anxiety, shortness of breath, and nosebleed. DIAGNOSIS To check if you have hypertension, your health care provider will measure your blood pressure while you are seated, with your arm held at the level of your heart. It should be measured at least twice using the same arm. Certain conditions can cause a difference in blood pressure between your right and left arms. A blood pressure reading that is higher than normal on one occasion does not mean that you need treatment. If  it is not clear whether you have high blood pressure, you may be asked to return on a different day to have your blood pressure checked again. Or, you may be asked to monitor your blood pressure at home for 1 or more weeks. TREATMENT Treating high blood pressure includes making lifestyle changes and possibly taking medicine. Living a healthy lifestyle can help lower high blood pressure. You may need to change some of your habits. Lifestyle changes may include:  Following the DASH diet. This diet is high in fruits, vegetables, and whole grains. It is low in salt, red meat, and added sugars.  Keep your sodium intake below 2,300 mg per day.  Getting at least 30-45 minutes of aerobic exercise at least 4 times per week.  Losing weight if necessary.  Not smoking.  Limiting alcoholic beverages.  Learning ways to reduce stress. Your health care provider may prescribe medicine if lifestyle changes are not enough to get your blood pressure under control, and if one of the following is true:  You are 18-59 years of age and your systolic blood pressure is above 140.  You are 60 years of age or older, and your systolic blood pressure is above 150.  Your diastolic blood pressure is above 90.  You have diabetes, and your systolic blood pressure is over 140 or your diastolic blood pressure is over 90.  You have kidney disease and your blood pressure is above 140/90.  You have heart disease and your blood pressure is above 140/90. Your personal target blood pressure may vary depending on your medical conditions, your age, and other factors. HOME CARE INSTRUCTIONS    Have your blood pressure rechecked as directed by your health care provider.   Take medicines only as directed by your health care provider. Follow the directions carefully. Blood pressure medicines must be taken as prescribed. The medicine does not work as well when you skip doses. Skipping doses also puts you at risk for  problems.  Do not smoke.   Monitor your blood pressure at home as directed by your health care provider. SEEK MEDICAL CARE IF:   You think you are having a reaction to medicines taken.  You have recurrent headaches or feel dizzy.  You have swelling in your ankles.  You have trouble with your vision. SEEK IMMEDIATE MEDICAL CARE IF:  You develop a severe headache or confusion.  You have unusual weakness, numbness, or feel faint.  You have severe chest or abdominal pain.  You vomit repeatedly.  You have trouble breathing. MAKE SURE YOU:   Understand these instructions.  Will watch your condition.  Will get help right away if you are not doing well or get worse.   This information is not intended to replace advice given to you by your health care provider. Make sure you discuss any questions you have with your health care provider.   Document Released: 03/10/2005 Document Revised: 07/25/2014 Document Reviewed: 12/31/2012 Elsevier Interactive Patient Education 2016 [employer]  

## 2016-01-29 ENCOUNTER — Other Ambulatory Visit: Payer: Self-pay | Admitting: Family

## 2016-01-29 DIAGNOSIS — R7989 Other specified abnormal findings of blood chemistry: Secondary | ICD-10-CM

## 2016-01-29 LAB — BMP8+EGFR
BUN/Creatinine Ratio: 16 (ref 10–24)
BUN: 23 mg/dL (ref 8–27)
CO2: 27 mmol/L (ref 18–29)
Calcium: 9.6 mg/dL (ref 8.6–10.2)
Chloride: 96 mmol/L (ref 96–106)
Creatinine, Ser: 1.44 mg/dL — ABNORMAL HIGH (ref 0.76–1.27)
GFR calc Af Amer: 55 mL/min/{1.73_m2} — ABNORMAL LOW (ref >59)
GFR calc non Af Amer: 48 mL/min/{1.73_m2} — ABNORMAL LOW (ref >59)
GLUCOSE: 114 mg/dL — AB (ref 65–99)
POTASSIUM: 4.4 mmol/L (ref 3.5–5.2)
SODIUM: 140 mmol/L (ref 134–144)

## 2016-01-29 NOTE — Addendum Note (Signed)
Addended by: Thana Ates on: 01/29/2016 09:02 AM   Modules accepted: Orders

## 2016-02-06 ENCOUNTER — Other Ambulatory Visit: Payer: Medicare Other

## 2016-02-06 DIAGNOSIS — R7989 Other specified abnormal findings of blood chemistry: Secondary | ICD-10-CM | POA: Diagnosis not present

## 2016-02-07 LAB — BMP8+EGFR
BUN/Creatinine Ratio: 16 (ref 10–24)
BUN: 19 mg/dL (ref 8–27)
CALCIUM: 8.9 mg/dL (ref 8.6–10.2)
CO2: 24 mmol/L (ref 18–29)
CREATININE: 1.19 mg/dL (ref 0.76–1.27)
Chloride: 99 mmol/L (ref 96–106)
GFR, EST AFRICAN AMERICAN: 70 mL/min/{1.73_m2} (ref >59)
GFR, EST NON AFRICAN AMERICAN: 60 mL/min/{1.73_m2} (ref >59)
Glucose: 108 mg/dL — ABNORMAL HIGH (ref 65–99)
Potassium: 4.2 mmol/L (ref 3.5–5.2)
Sodium: 141 mmol/L (ref 134–144)

## 2016-02-12 ENCOUNTER — Ambulatory Visit (INDEPENDENT_AMBULATORY_CARE_PROVIDER_SITE_OTHER): Payer: Medicare Other | Admitting: Family Medicine

## 2016-02-12 ENCOUNTER — Encounter: Payer: Self-pay | Admitting: Family Medicine

## 2016-02-12 VITALS — BP 168/94 | HR 79 | Temp 97.5°F | Ht 67.0 in | Wt 255.6 lb

## 2016-02-12 DIAGNOSIS — N471 Phimosis: Secondary | ICD-10-CM | POA: Diagnosis not present

## 2016-02-12 DIAGNOSIS — N476 Balanoposthitis: Secondary | ICD-10-CM | POA: Diagnosis not present

## 2016-02-12 MED ORDER — MUPIROCIN 2 % EX OINT: 1.0000 "application " | TOPICAL_OINTMENT | Freq: Two times a day (BID) | CUTANEOUS | 1 refills | Status: DC

## 2016-02-12 MED ORDER — SULFAMETHOXAZOLE-TRIMETHOPRIM 800-160 MG PO TABS: 1.0000 | ORAL_TABLET | Freq: Two times a day (BID) | ORAL | 0 refills | Status: DC

## 2016-02-12 NOTE — Progress Notes (Signed)
   HPI  Patient presents today here with penile irritation.  Patient's lines that he's had a sore on the tip of his penis and penile irritation intermittently for the last 10 years. He describes burning when he urinates and states that he's no longer able to pull back his foreskin.  Over the last month it's Progressively Worse.   He denies any prostate problems, he is passing urine easily, has burning.   PMH: Smoking status noted ROS: Per HPI  Objective: BP (!) 168/94   Pulse 79   Temp 97.5 F (36.4 C) (Oral)   Ht 5\' 7"  (1.702 m)   Wt 255 lb 9.6 oz (115.9 kg)   BMI 40.03 kg/m  Gen: NAD, alert, cooperative with exam HEENT: NCAT, EOMI, PERRL CV: RRR, good S1/S2, no murmur Resp: CTABL, no wheezes, non-labored Neuro: Alert and oriented, No gross deficits  GU:  Phimosis, irritation and redness of the foreskin at the urethral opening, no open lesions  Assessment and plan:  # phimosis, Balanoposthitis ( new problem)  Phimosis with concern for acute on chronic balanoposthitis Treat Bactrim and mupirocin ointment Refer to urology to address long time improvement Discussed red flags of difficulty passing urine, this occurs return to clinic right away   HTN Elevated today, patient is noticeably nervous about the genital exam and I believ this is likely the underlying issue.  Usually controlled, no changes    Orders Placed This Encounter  Procedures  . Ambulatory referral to Urology    Referral Priority:   Routine    Referral Type:   Consultation    Referral Reason:   Specialty Services Required    Requested Specialty:   Urology    Number of Visits Requested:   1    Meds ordered this encounter  Medications  . sulfamethoxazole-trimethoprim (BACTRIM DS) 800-160 MG tablet    Sig: Take 1 tablet by mouth 2 (two) times daily.    Dispense:  14 tablet    Refill:  0  . mupirocin ointment (BACTROBAN) 2 %    Sig: Apply 1 application topically 2 (two) times daily.   Dispense:  22 g    Refill:  [place], MD [facility] 02/12/2016, 2:54 PM

## 2016-02-12 NOTE — Patient Instructions (Signed)
Great to meet you!  We will work on a urology appointment  Please let us know if you have any worsening symptoms  Finsh all antibiotics, try the ointment twice daily

## 2016-02-25 ENCOUNTER — Other Ambulatory Visit: Payer: Self-pay | Admitting: Family Medicine

## 2016-02-27 NOTE — Telephone Encounter (Signed)
Patient aware and verbalizes understanding. 

## 2016-03-21 ENCOUNTER — Other Ambulatory Visit: Payer: Self-pay | Admitting: Pharmacist

## 2016-03-21 NOTE — Patient Outreach (Signed)
Outreach call to [employer] regarding medication adherence to atorvastatin 20mg .  HIPPA compliant voicemail was left for the patient.   Harlow Asa, PharmD, [facility] [phone]

## 2016-04-21 ENCOUNTER — Other Ambulatory Visit: Payer: Self-pay | Admitting: Family

## 2016-04-24 ENCOUNTER — Encounter: Payer: Self-pay | Admitting: Family

## 2016-04-24 ENCOUNTER — Ambulatory Visit (INDEPENDENT_AMBULATORY_CARE_PROVIDER_SITE_OTHER): Payer: Medicare Other | Admitting: Family

## 2016-04-24 VITALS — BP 131/83 | HR 58 | Temp 97.0°F | Ht 67.0 in | Wt 258.0 lb

## 2016-04-24 DIAGNOSIS — K089 Disorder of teeth and supporting structures, unspecified: Secondary | ICD-10-CM

## 2016-04-24 DIAGNOSIS — E785 Hyperlipidemia, unspecified: Secondary | ICD-10-CM | POA: Diagnosis not present

## 2016-04-24 DIAGNOSIS — N183 Chronic kidney disease, stage 3 unspecified: Secondary | ICD-10-CM

## 2016-04-24 DIAGNOSIS — Z87891 Personal history of nicotine dependence: Secondary | ICD-10-CM

## 2016-04-24 DIAGNOSIS — M109 Gout, unspecified: Secondary | ICD-10-CM | POA: Diagnosis not present

## 2016-04-24 DIAGNOSIS — G8929 Other chronic pain: Secondary | ICD-10-CM | POA: Diagnosis not present

## 2016-04-24 DIAGNOSIS — I10 Essential (primary) hypertension: Secondary | ICD-10-CM

## 2016-04-24 DIAGNOSIS — M549 Dorsalgia, unspecified: Secondary | ICD-10-CM

## 2016-04-24 DIAGNOSIS — F419 Anxiety disorder, unspecified: Secondary | ICD-10-CM

## 2016-04-24 DIAGNOSIS — E8881 Metabolic syndrome: Secondary | ICD-10-CM

## 2016-04-24 DIAGNOSIS — Z1211 Encounter for screening for malignant neoplasm of colon: Secondary | ICD-10-CM

## 2016-04-24 NOTE — Progress Notes (Signed)
Subjective:    Patient ID: Marcus Carr, male    DOB: [birth year], 76 y.o.   MRN: [id number]  Pt presents to the office today for chronic follow up. PT has history of osteomyelitis of mandible, doing stable at this time. Pt is followed by Pain Management every 3 months for chronic back pain.  Hypertension  This is a chronic problem. The current episode started more than 1 year ago. The problem has been resolved since onset. The problem is controlled. Associated symptoms include anxiety. Pertinent negatives include no palpitations, peripheral edema or shortness of breath. Risk factors for coronary artery disease include obesity, male gender, sedentary lifestyle, dyslipidemia and family history. Past treatments include calcium channel blockers and diuretics. The current treatment provides mild improvement. Hypertensive end-organ damage includes kidney disease. There is no history of CAD/MI, CVA or heart failure.  Hyperlipidemia  This is a chronic problem. The current episode started more than 1 year ago. The problem is controlled. Recent lipid tests were reviewed and are normal. Exacerbating diseases include obesity. He has no history of diabetes. Pertinent negatives include no leg pain or shortness of breath. Current antihyperlipidemic treatment includes statins and nicotinic acid. The current treatment provides moderate improvement of lipids. Risk factors for coronary artery disease include a sedentary lifestyle, obesity, hypertension, male sex, family history and dyslipidemia.  Anxiety  Presents for follow-up visit. Patient reports no depressed mood, excessive worry, irritability, nervous/anxious behavior, palpitations or shortness of breath. Symptoms occur rarely. The severity of symptoms is mild. The quality of sleep is good.    Back Pain  This is a chronic problem. The current episode started more than 1 year ago. The problem occurs intermittently. The problem is unchanged. The pain is  present in the lumbar spine. The quality of the pain is described as aching. The pain is at a severity of 5/10. The pain is moderate. The symptoms are aggravated by bending and standing. Pertinent negatives include no bladder incontinence, bowel incontinence, dysuria, leg pain or tingling. Risk factors include obesity. He has tried analgesics and bed rest for the symptoms. The treatment provided mild relief.  Gout PT stopped his allopurinol 300 mg and it has been years since his last "gout flare up". Metabolic Syndrome Pt states he tries to do low fat diet, but does not exercise.   Review of Systems  Constitutional: Negative.  Negative for irritability.  HENT: Negative.   Respiratory: Negative.  Negative for shortness of breath.   Cardiovascular: Negative.  Negative for palpitations.  Gastrointestinal: Negative.  Negative for bowel incontinence.  Endocrine: Negative.   Genitourinary: Negative.  Negative for bladder incontinence and dysuria.  Musculoskeletal: Positive for back pain.  Neurological: Negative.  Negative for tingling.  Hematological: Negative.   Psychiatric/Behavioral: Negative.  The patient is not nervous/anxious.   All other systems reviewed and are negative.      Objective:   Physical Exam  Constitutional: He is oriented to person, place, and time. He appears well-developed and well-nourished. No distress.  HENT:  Head: Normocephalic.  Right Ear: External ear normal.  Left Ear: External ear normal.  Nose: Nose normal.  Mouth/Throat: Oropharynx is clear and moist. He has dentures (No teeth present).  Eyes: Pupils are equal, round, and reactive to light. Right eye exhibits no discharge. Left eye exhibits no discharge.  Neck: Normal range of motion. Neck supple. No thyromegaly present.  Cardiovascular: Normal rate, regular rhythm, normal heart sounds and intact distal pulses.   No murmur heard. Pulmonary/Chest:  Effort normal and breath sounds normal. No respiratory  distress. He has no wheezes.  Abdominal: Soft. Bowel sounds are normal. He exhibits no distension. There is no tenderness.  Musculoskeletal: Normal range of motion. He exhibits no edema or tenderness.  Neurological: He is alert and oriented to person, place, and time. He has normal reflexes. No cranial nerve deficit.  Skin: Skin is warm and dry. No rash noted. No erythema.  Psychiatric: He has a normal mood and affect. His behavior is normal. Judgment and thought content normal.  Vitals reviewed.     BP 131/83   Pulse (!) 58   Temp 97 F (36.1 C) (Oral)   Ht 5' 7" (1.702 m)   Wt 258 lb (117 kg)   BMI 40.41 kg/m      Assessment & Plan:  1. Essential hypertension - CMP14+EGFR  2. Stage 3 chronic kidney disease - CMP14+EGFR  3. Morbid obesity (HCC)  - CMP14+EGFR - Lipid panel  4. Metabolic syndrome  - CMP14+EGFR - Lipid panel  5. Hyperlipidemia, unspecified hyperlipidemia type  - CMP14+EGFR - Lipid panel  6. Anxiety - CMP14+EGFR  7. Hx of smoking  - CMP14+EGFR  8. Poor dentition  - CMP14+EGFR  9. Gout of right foot, unspecified cause, unspecified chronicity - CMP14+EGFR  10. Chronic bilateral back pain, unspecified back location  - CMP14+EGFR  11. Colon cancer screening  - CMP14+EGFR - Fecal occult blood, imunochemical; Future   Continue all meds Labs pending Health Maintenance reviewed Diet and exercise encouraged RTO 6 months   Christy Hawks, FNP  

## 2016-04-24 NOTE — Patient Instructions (Signed)
Chronic Back Pain When back pain lasts longer than 3 months, it is called chronic back pain.The cause of your back pain may not be known. Some common causes include:  Wear and tear (degenerative disease) of the bones, ligaments, or disks in your back.  Inflammation and stiffness in your back (arthritis). People who have chronic back pain often go through certain periods in which the pain is more intense (flare-ups). Many people can learn to manage the pain with home care. Follow these instructions at home: Pay attention to any changes in your symptoms. Take these actions to help with your pain: Activity   Avoid bending and activities that make the problem worse.  Do not sit or stand in one place for long periods of time.  Take brief periods of rest throughout the day. This will reduce your pain. Resting in a lying or standing position is usually better than sitting to rest.  When you are resting for longer periods, mix in some mild activity or stretching between periods of rest. This will help to prevent stiffness and pain.  Get regular exercise. Ask your health care provider what activities are safe for you.  Do not lift anything that is heavier than 10 lb (4.5 kg). Always use proper lifting technique, which includes:  Bending your knees.  Keeping the load close to your body.  Avoiding twisting. Managing pain   If directed, apply ice to the painful area. Your health care provider may recommend applying ice during the first 24-48 hours after a flare-up begins.  Put ice in a plastic bag.  Place a towel between your skin and the bag.  Leave the ice on for 20 minutes, 2-3 times per day.  After icing, apply heat to the affected area as often as told by your health care provider. Use the heat source that your health care provider recommends, such as a moist heat pack or a heating pad.  Place a towel between your skin and the heat source.  Leave the heat on for 20-30  minutes.  Remove the heat if your skin turns bright red. This is especially important if you are unable to feel pain, heat, or cold. You may have a greater risk of getting burned.  Try soaking in a warm tub.  Take over-the-counter and prescription medicines only as told by your health care provider.  Keep all follow-up visits as told by your health care provider. This is important. Contact a health care provider if:  You have pain that is not relieved with rest or medicine. Get help right away if:  You have weakness or numbness in one or both of your legs or feet.  You have trouble controlling your bladder or your bowels.  You have nausea or vomiting.  You have pain in your abdomen.  You have shortness of breath or you faint. This information is not intended to replace advice given to you by your health care provider. Make sure you discuss any questions you have with your health care provider. Document Released: 04/17/2004 Document Revised: 07/19/2015 Document Reviewed: 08/28/2014 Elsevier Interactive Patient Education  2017 Elsevier Inc.  

## 2016-04-25 ENCOUNTER — Other Ambulatory Visit: Payer: Self-pay | Admitting: Family

## 2016-04-25 DIAGNOSIS — R7989 Other specified abnormal findings of blood chemistry: Secondary | ICD-10-CM

## 2016-04-25 LAB — LIPID PANEL
CHOLESTEROL TOTAL: 236 mg/dL — AB (ref 100–199)
Chol/HDL Ratio: 6.6 ratio units — ABNORMAL HIGH (ref 0.0–5.0)
HDL: 36 mg/dL — ABNORMAL LOW (ref >39)
LDL CALC: 170 mg/dL — AB (ref 0–99)
Triglycerides: 151 mg/dL — ABNORMAL HIGH (ref 0–149)
VLDL CHOLESTEROL CAL: 30 mg/dL (ref 5–40)

## 2016-04-25 LAB — CMP14+EGFR
ALBUMIN: 4.4 g/dL (ref 3.5–4.8)
ALK PHOS: 81 IU/L (ref 39–117)
ALT: 17 IU/L (ref 0–44)
AST: 24 IU/L (ref 0–40)
Albumin/Globulin Ratio: 1.6 (ref 1.2–2.2)
BILIRUBIN TOTAL: 0.5 mg/dL (ref 0.0–1.2)
BUN / CREAT RATIO: 16 (ref 10–24)
BUN: 22 mg/dL (ref 8–27)
CHLORIDE: 94 mmol/L — AB (ref 96–106)
CO2: 27 mmol/L (ref 18–29)
CREATININE: 1.34 mg/dL — AB (ref 0.76–1.27)
Calcium: 9.5 mg/dL (ref 8.6–10.2)
GFR calc non Af Amer: 52 mL/min/{1.73_m2} — ABNORMAL LOW (ref >59)
GFR, EST AFRICAN AMERICAN: 60 mL/min/{1.73_m2} (ref >59)
GLOBULIN, TOTAL: 2.8 g/dL (ref 1.5–4.5)
Glucose: 101 mg/dL — ABNORMAL HIGH (ref 65–99)
Potassium: 4.7 mmol/L (ref 3.5–5.2)
SODIUM: 137 mmol/L (ref 134–144)
TOTAL PROTEIN: 7.2 g/dL (ref 6.0–8.5)

## 2016-04-25 MED ORDER — ATORVASTATIN CALCIUM 20 MG PO TABS: 20.0000 mg | ORAL_TABLET | Freq: Every day | ORAL | 3 refills | Status: DC

## 2016-04-29 ENCOUNTER — Ambulatory Visit: Payer: Medicare Other | Admitting: Family

## 2016-05-19 ENCOUNTER — Other Ambulatory Visit: Payer: Self-pay | Admitting: Nurse Practitioner

## 2016-05-19 DIAGNOSIS — I10 Essential (primary) hypertension: Secondary | ICD-10-CM

## 2016-07-20 ENCOUNTER — Other Ambulatory Visit: Payer: Self-pay | Admitting: Family

## 2016-07-20 ENCOUNTER — Other Ambulatory Visit: Payer: Self-pay | Admitting: Family Medicine

## 2016-08-29 DIAGNOSIS — G8929 Other chronic pain: Secondary | ICD-10-CM | POA: Diagnosis not present

## 2016-08-29 DIAGNOSIS — M5441 Lumbago with sciatica, right side: Secondary | ICD-10-CM | POA: Diagnosis not present

## 2016-09-12 DIAGNOSIS — H2513 Age-related nuclear cataract, bilateral: Secondary | ICD-10-CM | POA: Diagnosis not present

## 2016-09-12 DIAGNOSIS — H40033 Anatomical narrow angle, bilateral: Secondary | ICD-10-CM | POA: Diagnosis not present

## 2016-10-14 ENCOUNTER — Other Ambulatory Visit: Payer: Self-pay | Admitting: Family

## 2016-10-22 ENCOUNTER — Ambulatory Visit (INDEPENDENT_AMBULATORY_CARE_PROVIDER_SITE_OTHER): Payer: Medicare Other | Admitting: Family

## 2016-10-22 ENCOUNTER — Encounter: Payer: Self-pay | Admitting: Family

## 2016-10-22 VITALS — BP 108/72 | HR 56 | Temp 97.0°F | Ht 67.0 in | Wt 258.6 lb

## 2016-10-22 DIAGNOSIS — F419 Anxiety disorder, unspecified: Secondary | ICD-10-CM | POA: Diagnosis not present

## 2016-10-22 DIAGNOSIS — G8929 Other chronic pain: Secondary | ICD-10-CM

## 2016-10-22 DIAGNOSIS — E785 Hyperlipidemia, unspecified: Secondary | ICD-10-CM

## 2016-10-22 DIAGNOSIS — M549 Dorsalgia, unspecified: Secondary | ICD-10-CM

## 2016-10-22 DIAGNOSIS — I10 Essential (primary) hypertension: Secondary | ICD-10-CM | POA: Diagnosis not present

## 2016-10-22 MED ORDER — VERAPAMIL HCL ER 180 MG PO TBCR: EXTENDED_RELEASE_TABLET | ORAL | 1 refills | Status: DC

## 2016-10-22 MED ORDER — ATORVASTATIN CALCIUM 20 MG PO TABS: 20.0000 mg | ORAL_TABLET | Freq: Every day | ORAL | 3 refills | Status: DC

## 2016-10-22 MED ORDER — HYDROCHLOROTHIAZIDE 12.5 MG PO CAPS: 12.5000 mg | ORAL_CAPSULE | Freq: Every day | ORAL | 6 refills | Status: DC

## 2016-10-22 MED ORDER — CITALOPRAM HYDROBROMIDE 20 MG PO TABS: 20.0000 mg | ORAL_TABLET | Freq: Every day | ORAL | 0 refills | Status: DC

## 2016-10-22 NOTE — Progress Notes (Signed)
Subjective:    Patient ID: Marcus Carr, male    DOB: [birth year], 76 y.o.   MRN: [id number]  Pt presents to the office today for chronic follow up. PT has history of osteomyelitis of mandible, doing stable at this time. Pt is followed by Pain Management every 3 months for chronic back pain.   PT states his has family history of cardiovascular. States his dad and brother both died at age 57 with MI.  Hypertension  This is a chronic problem. The current episode started more than 1 year ago. The problem has been resolved since onset. The problem is controlled. Associated symptoms include anxiety. Pertinent negatives include no malaise/fatigue, peripheral edema or shortness of breath. Risk factors for coronary artery disease include dyslipidemia, obesity, sedentary lifestyle and family history. The current treatment provides moderate improvement. Hypertensive end-organ damage includes CAD/MI. There is no history of kidney disease, CVA or heart failure.  Hyperlipidemia  This is a chronic problem. The current episode started more than 1 year ago. The problem is uncontrolled. Recent lipid tests were reviewed and are high. Exacerbating diseases include obesity. Pertinent negatives include no shortness of breath. Current antihyperlipidemic treatment includes statins. The current treatment provides mild improvement of lipids. Risk factors for coronary artery disease include dyslipidemia, male sex, obesity and family history.  Anxiety  Presents for follow-up visit. Symptoms include excessive worry, irritability and nervous/anxious behavior. Patient reports no decreased concentration or shortness of breath. Symptoms occur occasionally. The severity of symptoms is mild. The quality of sleep is good.    Back Pain  This is a chronic problem. The current episode started more than 1 year ago. The problem occurs intermittently. The problem has been waxing and waning since onset. The pain is present in the  lumbar spine. The pain is at a severity of 3/10. Pertinent negatives include no bladder incontinence or bowel incontinence. Risk factors include obesity. He has tried analgesics for the symptoms. The treatment provided moderate relief.      Review of Systems  Constitutional: Positive for irritability. Negative for malaise/fatigue.  Respiratory: Negative for shortness of breath.   Gastrointestinal: Negative for bowel incontinence.  Genitourinary: Negative for bladder incontinence.  Musculoskeletal: Positive for back pain.  Psychiatric/Behavioral: Negative for decreased concentration. The patient is nervous/anxious.        Objective:   Physical Exam  Constitutional: He is oriented to person, place, and time. He appears well-developed and well-nourished. No distress.  HENT:  Head: Normocephalic.  Right Ear: External ear normal.  Left Ear: External ear normal.  Nose: Nose normal.  Mouth/Throat: Oropharynx is clear and moist. Abnormal dentition.  Eyes: Pupils are equal, round, and reactive to light. Right eye exhibits no discharge. Left eye exhibits no discharge.  Neck: Normal range of motion. Neck supple. No thyromegaly present.  Cardiovascular: Normal rate, regular rhythm, normal heart sounds and intact distal pulses.   No murmur heard. Pulmonary/Chest: Effort normal and breath sounds normal. No respiratory distress. He has no wheezes.  Abdominal: Soft. Bowel sounds are normal. He exhibits no distension. There is no tenderness.  Musculoskeletal: Normal range of motion. He exhibits no edema or tenderness.  Neurological: He is alert and oriented to person, place, and time. He has normal reflexes. No cranial nerve deficit.  Skin: Skin is warm and dry. No rash noted. No erythema.  Psychiatric: He has a normal mood and affect. His behavior is normal. Judgment and thought content normal.  Vitals reviewed.     BP 108/72  Pulse (!) 56   Temp (!) 97 F (36.1 C) (Oral)   Ht '5\' 7"'   (1.702 m)   Wt 258 lb 9.6 oz (117.3 kg)   BMI 40.50 kg/m      Assessment & Plan:  1. Essential hypertension - CMP14+EGFR - verapamil (CALAN-SR) 180 MG CR tablet; TAKE 1 TABLET (180 MG TOTAL) BY MOUTH AT BEDTIME.  Dispense: 90 tablet; Refill: 1 - hydrochlorothiazide (MICROZIDE) 12.5 MG capsule; Take 1 capsule (12.5 mg total) by mouth daily.  Dispense: 30 capsule; Refill: 6  2. Morbid obesity ([place]) - CMP14+EGFR  3. Hyperlipidemia, unspecified hyperlipidemia type - CMP14+EGFR - Lipid panel - atorvastatin (LIPITOR) 20 MG tablet; Take 1 tablet (20 mg total) by mouth daily.  Dispense: 90 tablet; Refill: 3  4. Chronic bilateral back pain, unspecified back location - CMP14+EGFR  5. Anxiet - CMP14+EGFR - citalopram (CELEXA) 20 MG tablet; Take 1 tablet (20 mg total) by mouth daily.  Dispense: 90 tablet; Refill: 0   Continue all meds Labs pending Health Maintenance reviewed Diet and exercise encouraged RTO 6 months   [name], FNP

## 2016-10-22 NOTE — Patient Instructions (Signed)
Fat and Cholesterol Restricted Diet High levels of fat and cholesterol in your blood may lead to various health problems, such as diseases of the heart, blood vessels, gallbladder, liver, and pancreas. Fats are concentrated sources of energy that come in various forms. Certain types of fat, including saturated fat, may be harmful in excess. Cholesterol is a substance needed by your body in small amounts. Your body makes all the cholesterol it needs. Excess cholesterol comes from the food you eat. When you have high levels of cholesterol and saturated fat in your blood, health problems can develop because the excess fat and cholesterol will gather along the walls of your blood vessels, causing them to narrow. Choosing the right foods will help you control your intake of fat and cholesterol. This will help keep the levels of these substances in your blood within normal limits and reduce your risk of disease. What is my plan? Your health care provider recommends that you:  Limit your fat intake to ______% or less of your total calories per day.  Limit the amount of cholesterol in your diet to less than _________mg per day.  Eat 20-30 grams of fiber each day.  What types of fat should I choose?  Choose healthy fats more often. Choose monounsaturated and polyunsaturated fats, such as olive and canola oil, flaxseeds, walnuts, almonds, and seeds.  Eat more omega-3 fats. Good choices include salmon, mackerel, sardines, tuna, flaxseed oil, and ground flaxseeds. Aim to eat fish at least two times a week.  Limit saturated fats. Saturated fats are primarily found in animal products, such as meats, butter, and cream. Plant sources of saturated fats include palm oil, palm kernel oil, and coconut oil.  Avoid foods with partially hydrogenated oils in them. These contain trans fats. Examples of foods that contain trans fats are stick margarine, some tub margarines, cookies, crackers, and other baked goods. What  general guidelines do I need to follow? These guidelines for healthy eating will help you control your intake of fat and cholesterol:  Check food labels carefully to identify foods with trans fats or high amounts of saturated fat.  Fill one half of your plate with vegetables and green salads.  Fill one fourth of your plate with whole grains. Look for the word "whole" as the first word in the ingredient list.  Fill one fourth of your plate with lean protein foods.  Limit fruit to two servings a day. Choose fruit instead of juice.  Eat more foods that contain fiber, such as apples, broccoli, carrots, beans, peas, and barley.  Eat more home-cooked food and less restaurant, buffet, and fast food.  Limit or avoid alcohol.  Limit foods high in starch and sugar.  Limit fried foods.  Cook foods using methods other than frying. Baking, boiling, grilling, and broiling are all great options.  Lose weight if you are overweight. Losing just 5-10% of your initial body weight can help your overall health and prevent diseases such as diabetes and heart disease.  What foods can I eat? Grains  Whole grains, such as whole wheat or whole grain breads, crackers, cereals, and pasta. Unsweetened oatmeal, bulgur, barley, quinoa, or brown rice. Corn or whole wheat flour tortillas. Vegetables  Fresh or frozen vegetables (raw, steamed, roasted, or grilled). Green salads. Fruits  All fresh, canned (in natural juice), or frozen fruits. Meats and other protein foods  Ground beef (85% or leaner), grass-fed beef, or beef trimmed of fat. Skinless chicken or turkey. Ground chicken or turkey.   Pork trimmed of fat. All fish and seafood. Eggs. Dried beans, peas, or lentils. Unsalted nuts or seeds. Unsalted canned or dry beans. Dairy  Low-fat dairy products, such as skim or 1% milk, 2% or reduced-fat cheeses, low-fat ricotta or cottage cheese, or plain low-fat yo Fats and oils  Tub margarines without trans  fats. Light or reduced-fat mayonnaise and salad dressings. Avocado. Olive, canola, sesame, or safflower oils. Natural peanut or almond butter (choose ones without added sugar and oil). The items listed above may not be a complete list of recommended foods or beverages. Contact your dietitian for more options. Foods to avoid Grains  White bread. White pasta. White rice. Cornbread. Bagels, pastries, and croissants. Crackers that contain trans fat. Vegetables  White potatoes. Corn. Creamed or fried vegetables. Vegetables in a cheese sauce. Fruits  Dried fruits. Canned fruit in light or heavy syrup. Fruit juice. Meats and other protein foods  Fatty cuts of meat. Ribs, chicken wings, bacon, sausage, bologna, salami, chitterlings, fatback, hot dogs, bratwurst, and packaged luncheon meats. Liver and organ meats. Dairy  Whole or 2% milk, cream, half-and-half, and cream cheese. Whole milk cheeses. Whole-fat or sweetened yogurt. Full-fat cheeses. Nondairy creamers and whipped toppings. Processed cheese, cheese spreads, or cheese curds. Beverages  Alcohol. Sweetened drinks (such as sodas, lemonade, and fruit drinks or punches). Fats and oils  Butter, stick margarine, lard, shortening, ghee, or bacon fat. Coconut, palm kernel, or palm oils. Sweets and desserts  Corn syrup, sugars, honey, and molasses. Candy. Jam and jelly. Syrup. Sweetened cereals. Cookies, pies, cakes, donuts, muffins, and ice cream. The items listed above may not be a complete list of foods and beverages to avoid. Contact your dietitian for more information. This information is not intended to replace advice given to you by your health care provider. Make sure you discuss any questions you have with your health care provider. Document Released: 03/10/2005 Document Revised: 03/31/2014 Document Reviewed: 06/08/2013 Elsevier Interactive Patient Education  2017 Elsevier Inc.  

## 2016-10-23 LAB — CMP14+EGFR
ALT: 20 IU/L (ref 0–44)
AST: 27 IU/L (ref 0–40)
Albumin/Globulin Ratio: 1.6 (ref 1.2–2.2)
Albumin: 4.4 g/dL (ref 3.5–4.8)
Alkaline Phosphatase: 84 IU/L (ref 39–117)
BILIRUBIN TOTAL: 0.7 mg/dL (ref 0.0–1.2)
BUN / CREAT RATIO: 17 (ref 10–24)
BUN: 25 mg/dL (ref 8–27)
CHLORIDE: 97 mmol/L (ref 96–106)
CO2: 26 mmol/L (ref 20–29)
Calcium: 9.2 mg/dL (ref 8.6–10.2)
Creatinine, Ser: 1.43 mg/dL — ABNORMAL HIGH (ref 0.76–1.27)
GFR calc non Af Amer: 48 mL/min/{1.73_m2} — ABNORMAL LOW (ref >59)
GFR, EST AFRICAN AMERICAN: 55 mL/min/{1.73_m2} — AB (ref >59)
GLOBULIN, TOTAL: 2.8 g/dL (ref 1.5–4.5)
Glucose: 100 mg/dL — ABNORMAL HIGH (ref 65–99)
POTASSIUM: 4.5 mmol/L (ref 3.5–5.2)
SODIUM: 139 mmol/L (ref 134–144)
TOTAL PROTEIN: 7.2 g/dL (ref 6.0–8.5)

## 2016-10-23 LAB — LIPID PANEL
Chol/HDL Ratio: 4.1 ratio (ref 0.0–5.0)
Cholesterol, Total: 148 mg/dL (ref 100–199)
HDL: 36 mg/dL — AB (ref >39)
LDL Calculated: 87 mg/dL (ref 0–99)
Triglycerides: 126 mg/dL (ref 0–149)
VLDL Cholesterol Cal: 25 mg/dL (ref 5–40)

## 2016-11-11 ENCOUNTER — Other Ambulatory Visit: Payer: Self-pay | Admitting: Nurse Practitioner

## 2016-11-11 DIAGNOSIS — I10 Essential (primary) hypertension: Secondary | ICD-10-CM

## 2017-01-11 ENCOUNTER — Other Ambulatory Visit: Payer: Self-pay | Admitting: Family

## 2017-01-21 DIAGNOSIS — M5441 Lumbago with sciatica, right side: Secondary | ICD-10-CM | POA: Diagnosis not present

## 2017-01-21 DIAGNOSIS — G8929 Other chronic pain: Secondary | ICD-10-CM | POA: Diagnosis not present

## 2017-04-12 ENCOUNTER — Other Ambulatory Visit: Payer: Self-pay | Admitting: Family

## 2017-04-13 NOTE — Telephone Encounter (Signed)
Last seen 10/22/16  Marcus Carr

## 2017-07-08 ENCOUNTER — Encounter: Payer: Self-pay | Admitting: *Deleted

## 2017-07-08 ENCOUNTER — Ambulatory Visit (INDEPENDENT_AMBULATORY_CARE_PROVIDER_SITE_OTHER): Payer: Medicare Other | Admitting: *Deleted

## 2017-07-08 ENCOUNTER — Other Ambulatory Visit: Payer: Self-pay | Admitting: Family

## 2017-07-08 VITALS — BP 130/78 | HR 52 | Ht 67.0 in | Wt 262.0 lb

## 2017-07-08 DIAGNOSIS — Z1211 Encounter for screening for malignant neoplasm of colon: Secondary | ICD-10-CM

## 2017-07-08 DIAGNOSIS — Z23 Encounter for immunization: Secondary | ICD-10-CM | POA: Diagnosis not present

## 2017-07-08 DIAGNOSIS — I10 Essential (primary) hypertension: Secondary | ICD-10-CM

## 2017-07-08 DIAGNOSIS — Z0000 Encounter for general adult medical examination without abnormal findings: Secondary | ICD-10-CM

## 2017-07-08 NOTE — Progress Notes (Signed)
Subjective:   Marcus Carr is a 77 y.o. male who presents for an Initial Medicare Annual Wellness Visit.  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  He retired at age 39 due to back problems.  He enjoys working outside around his home and planting a garden.  Marcus Carr lives with his wife and 57 year old grandson who lives with them part time.  He has 2 daughters and 1 grandson.  Marcus Carr reports no surgeries ER visits or hospitalizations in the past year.  He feels his health is the same this year as it was last year.        Review of Systems  All negative today      Objective:    Today's Vitals   07/08/17 1153  BP: 130/78  Pulse: (!) 52  Weight: 262 lb (118.8 kg)  Height: 5\' 7"  (1.702 m)  PainSc: 0-No pain   Body mass index is 41.04 kg/m.  Advanced Directives 07/08/2017 11/06/2014  Does Patient Have a Medical Advance Directive? No No  Would patient like information on creating a medical advance directive? Yes (MAU/Ambulatory/Procedural Areas - Information given) -    Current Medications (verified) Outpatient Encounter Medications as of 07/08/2017  Medication Sig  . aspirin 325 MG tablet Take 325 mg by mouth daily.  Marland Kitchen atorvastatin (LIPITOR) 20 MG tablet Take 1 tablet (20 mg total) by mouth daily.  . cholecalciferol (VITAMIN D) 400 UNITS TABS tablet Take 800 Units by mouth daily.  . citalopram (CELEXA) 20 MG tablet Take 1 tablet (20 mg total) by mouth daily.  . fish oil-omega-3 fatty acids 1000 MG capsule Take 1 g by mouth 2 (two) times daily.   . hydrochlorothiazide (MICROZIDE) 12.5 MG capsule Take 1 capsule (12.5 mg total) by mouth daily.  Marland Kitchen oxyCODONE-acetaminophen (PERCOCET) 10-325 MG per tablet Take 1 tablet by mouth every 12 (twelve) hours as needed.  Marland Kitchen Specialty Vitamins Products (MAGNESIUM, AMINO ACID CHELATE,) 133 MG tablet Take 1 tablet by mouth daily.  . verapamil (Marcus-SR) 180 MG CR tablet TAKE 1 TABLET (180 MG TOTAL) BY MOUTH AT BEDTIME.  .  [DISCONTINUED] citalopram (CELEXA) 20 MG tablet TAKE 1 TABLET DAILY  . [DISCONTINUED] hydrochlorothiazide (MICROZIDE) 12.5 MG capsule TAKE 1 CAPSULE (12.5 MG TOTAL) BY MOUTH DAILY.  . [DISCONTINUED] mupirocin ointment (BACTROBAN) 2 % Apply 1 application topically 2 (two) times daily.   No facility-administered encounter medications on file as of 07/08/2017.     Allergies (verified) Penicillins   History: Past Medical History:  Diagnosis Date  . Abscess, jaw   . Anxiety   . Back pain    chronic back pain treated by Dr. Nelva Bush  . Chronic kidney disease   . Chronic mastoiditis of both sides 11/07/2014  . Cigarette smoker 11/07/2014  . Gout   . Hyperlipidemia   . Hypertension   . Morbid obesity ([place]) 11/07/2014  . Osteomyelitis of mandible 11/07/2014  . Poor dentition 11/07/2014  . Thyroid nodule 11/07/2014   Past Surgical History:  Procedure Laterality Date  . APPENDECTOMY    . TONSILLECTOMY     Family History  Problem Relation Age of Onset  . Cancer Mother        esophageal  . Heart attack Father 72   Social History   Socioeconomic History  . Marital status: Married    Spouse name: Not on file  . Number of children: 2  . Years of education: Not on file  . Highest education  level: Not on file  Occupational History  . Not on file  Social Needs  . Financial resource strain: Not on file  . Food insecurity:    Worry: Not on file    Inability: Not on file  . Transportation needs:    Medical: Not on file    Non-medical: Not on file  Tobacco Use  . Smoking status: Former Smoker    Packs/day: 1.50    Years: 50.00    Pack years: 75.00    Types: Cigarettes    Last attempt to quit: 10/24/2014    Years since quitting: 2.7  . Smokeless tobacco: Never Used  Substance and Sexual Activity  . Alcohol use: No    Alcohol/week: 0.0 oz  . Drug use: No  . Sexual activity: Not on file  Lifestyle  . Physical activity:    Days per week: Not on file    Minutes per session: Not on  file  . Stress: Not on file  Relationships  . Social connections:    Talks on phone: Not on file    Gets together: Not on file    Attends religious service: Not on file    Active member of club or organization: Not on file    Attends meetings of clubs or organizations: Not on file    Relationship status: Not on file  Other Topics Concern  . Not on file  Social History Narrative   Lives wife.  Two daughters.     Tobacco Counseling Counseling given: Yes   Clinical Intake:     Pain Score: 0-No pain                 Activities of Daily Living In your present state of health, do you have any difficulty performing the following activities: 07/08/2017  Hearing? Y  Comment decreased hearing both ears, left ear worse   Vision? N  Difficulty concentrating or making decisions? N  Walking or climbing stairs? Y  Comment Climbing several stairs is harder due to weight gain  Dressing or bathing? N  Doing errands, shopping? N  Preparing Food and eating ? N  Using the Toilet? N  In the past six months, have you accidently leaked urine? N  Do you have problems with loss of bowel control? N  Managing your Medications? N  Managing your Finances? N  Comment Wife manages finances  Housekeeping or managing your Housekeeping? N  Some recent data might be hidden     Immunizations and Health Maintenance Immunization History  Administered Date(s) Administered  . Influenza Split 01/05/2013  . Influenza, High Dose Seasonal PF 01/21/2014, 01/01/2015, 01/14/2016  . Influenza-Unspecified 01/21/2014, 01/01/2015  . Pneumococcal Conjugate-13 10/24/2015  . Pneumococcal Polysaccharide-23 07/08/2017  . Tdap 07/08/2017   Health Maintenance Due  Topic Date Due  . COLON CANCER SCREENING ANNUAL FOBT  02/10/1992  . TETANUS/TDAP  11/21/2015  . PNA vac Low Risk Adult (2 of 2 - PPSV23) 10/23/2016   FOBT given for completion at home today Tdap and pneumovax given today  Patient Care  Team: Sharion Balloon, FNP as PCP - General (Nurse Practitioner) Arnetha Courser (Inactive) Arnetha Courser (Inactive) as Surgeon Verlin Grills, MD as Attending Physician (Infectious Diseases)      Assessment:   This is a routine wellness examination for Marcus Carr.  Hearing/Vision screen Patient states he can tell his hearing has been declining over the past several years- he states the left ear is worse than the right.  He  declines a referral to audiology at this time.   States he sees well with his glasses, he is up to date on his eye exam   Dietary issues and exercise activities discussed: Current Exercise Habits: Home exercise routine, Type of exercise: Other - see comments(stationary bicycle), Time (Minutes): 20, Frequency (Times/Week): 1, Weekly Exercise (Minutes/Week): 20, Intensity: Mild, Exercise limited by: orthopedic condition(s)  Goals    . Exercise 3x per week (30 min per time)     Patient is a member at the [facility]- increase gym visits to 3 times per week for at 20-30 minutes on the stationary bike.      Patient states he eats 3 meals per day and snacks as needed.  Recommended a diet of mostly lean proteins, vegetables, fruits and whole grains    Depression Screen PHQ 2/9 Scores 07/08/2017 10/22/2016 04/24/2016 02/12/2016  PHQ - 2 Score 0 0 0 0    Fall Risk Fall Risk  07/08/2017 10/22/2016 04/24/2016 02/12/2016 01/28/2016  Falls in the past year? No No No No No  Risk for fall due to : - - - - -  Risk for fall due to: Comment - - - - -    Is the patient's home free of loose throw rugs in walkways, pet beds, electrical cords, etc?   no- throw rug in kitchen      Grab bars in the bathroom? no- has them, but has not installed yet      Handrails on the stairs?   yes      Adequate lighting?   yes    Cognitive Function: MMSE - Mini Mental State Exam 07/08/2017  Orientation to time 5  Orientation to Place 5  Registration 3  Attention/ Calculation 0  Recall 2   Language- name 2 objects 2  Language- repeat 1  Language- follow 3 step command 3  Language- read & follow direction 1  Write a sentence 1  Copy design 1  Total score 24        Screening Tests Health Maintenance  Topic Date Due  . COLON CANCER SCREENING ANNUAL FOBT  02/10/1992  . TETANUS/TDAP  11/21/2015  . PNA vac Low Risk Adult (2 of 2 - PPSV23) 10/23/2016  . COLONOSCOPY  01/28/2028 (Originally 02/10/1992)  . INFLUENZA VACCINE  10/22/2017   Pneumovax 23 and tdap given today. FOBT given for completion at home.    Qualifies for Shingles Vaccine? declined   Additional Screenings:  Hepatitis C Screening: recommend at next visit with [name], FNP      Plan:     Work on increasing exercise to 3 times per week for 20-30 minutes - stationary bike is a good option. Review information given on medical advance directives, and if completed, please bring our office a copy to file in your chart. Please complete your stool test and return to [facility].  Please attend your follow up appointment with [name], FNP on 07/15/17 at 10:40 am.   I have personally reviewed and noted the following in the patient's chart:   . Medical and social history . Use of alcohol, tobacco or illicit drugs  . Current medications and supplements . Functional ability and status . Nutritional status . Physical activity . Advanced directives . List of other physicians . Hospitalizations, surgeries, and ER visits in previous 12 months . Vitals . Screenings to include cognitive, depression, and falls . Referrals and appointments  In addition, I have reviewed and discussed with patient  certain preventive protocols, quality metrics, and best practice recommendations. A written personalized care plan for preventive services as well as general preventive health recommendations were provided to patient.     Denyce Robert, RN   07/08/2017

## 2017-07-08 NOTE — Telephone Encounter (Signed)
Last seen 10/22/16  Marcus Carr

## 2017-07-08 NOTE — Patient Instructions (Signed)
Please work on increasing your exercise to 3 times per week for 20-30 minutes - stationary bike is a good option.  Please review information given on medical advance directives.  If you complete these, please bring our office a copy to file in your chart.  You received your pneumovax (pneumonia) and tdap (tetanus) vaccines today.   Please complete your stool test and return to [place].   Thank you for coming in for your Annual Wellness Visit today.       Preventive Care 76 Years and Older, Male Preventive care refers to lifestyle choices and visits with your health care provider that can promote health and wellness. What does preventive care include?  A yearly physical exam. This is also called an annual well check.  Dental exams once or twice a year.  Routine eye exams. Ask your health care provider how often you should have your eyes checked.  Personal lifestyle choices, including: ? Daily care of your teeth and gums. ? Regular physical activity. ? Eating a healthy diet. ? Avoiding tobacco and drug use. ? Limiting alcohol use. ? Practicing safe sex. ? Taking low doses of aspirin every day. ? Taking vitamin and mineral supplements as recommended by your health care provider. What happens during an annual well check? The services and screenings done by your health care provider during your annual well check will depend on your age, overall health, lifestyle risk factors, and family history of disease. Counseling Your health care provider may ask you questions about your:  Alcohol use.  Tobacco use.  Drug use.  Emotional well-being.  Home and relationship well-being.  Sexual activity.  Eating habits.  History of falls.  Memory and ability to understand (cognition).  Work and work Statistician.  Screening You may have the following tests or measurements:  Height, weight, and BMI.  Blood pressure.  Lipid and cholesterol levels.  These may be checked every 5 years, or more frequently if you are over 76 years old.  Skin check.  Lung cancer screening. You may have this screening every year starting at age 76 if you have a 30-pack-year history of smoking and currently smoke or have quit within the past 15 years.  Fecal occult blood test (FOBT) of the stool. You may have this test every year starting at age 76.  Flexible sigmoidoscopy or colonoscopy. You may have a sigmoidoscopy every 5 years or a colonoscopy every 10 years starting at age 76.  Prostate cancer screening. Recommendations will vary depending on your family history and other risks.  Hepatitis C blood test.  Hepatitis B blood test.  Sexually transmitted disease (STD) testing.  Diabetes screening. This is done by checking your blood sugar (glucose) after you have not eaten for a while (fasting). You may have this done every 1-3 years.  Abdominal aortic aneurysm (AAA) screening. You may need this if you are a current or former smoker.  Osteoporosis. You may be screened starting at age 88 if you are at high risk.  Talk with your health care provider about your test results, treatment options, and if necessary, the need for more tests. Vaccines Your health care provider may recommend certain vaccines, such as:  Influenza vaccine. This is recommended every year.  Tetanus, diphtheria, and acellular pertussis (Tdap, Td) vaccine. You may need a Td booster every 10 years.  Varicella vaccine. You may need this if you have not been vaccinated.  Zoster vaccine. You may need this after age 76.  Measles, mumps, and rubella (MMR) vaccine. You may need at least one dose of MMR if you were born in 1957 or later. You may also need a second dose.  Pneumococcal 13-valent conjugate (PCV13) vaccine. One dose is recommended after age 76.  Pneumococcal polysaccharide (PPSV23) vaccine. One dose is recommended after age 76.  Meningococcal vaccine. You may need this  if you have certain conditions.  Hepatitis A vaccine. You may need this if you have certain conditions or if you travel or work in places where you may be exposed to hepatitis A.  Hepatitis B vaccine. You may need this if you have certain conditions or if you travel or work in places where you may be exposed to hepatitis B.  Haemophilus influenzae type b (Hib) vaccine. You may need this if you have certain risk factors.  Talk to your health care provider about which screenings and vaccines you need and how often you need them. This information is not intended to replace advice given to you by your health care provider. Make sure you discuss any questions you have with your health care provider. Document Released: 04/06/2015 Document Revised: 11/28/2015 Document Reviewed: 01/09/2015 Elsevier Interactive Patient Education  [employer].

## 2017-07-09 IMAGING — CT CT NECK W/ CM
4 of 5 series · 14 of 33 positions shown, 16 images · IV contrast (omnipaque)
Comparison: None.

CLINICAL DATA: Lower chin abscess for several weeks with drainage.

EXAM:
CT NECK WITH CONTRAST
TECHNIQUE: Multidetector CT imaging of the neck was performed using the
standard protocol following the bolus administration of intravenous
contrast.
CONTRAST:  75mL OMNIPAQUE IOHEXOL 300 MG/ML  SOLN

[Series 3: neck 2.0 i31s 3 · axial · 0.43mm/px · z∈[-205,-145]mm · 2 of 119 slices shown]
[im 30/119  bone]
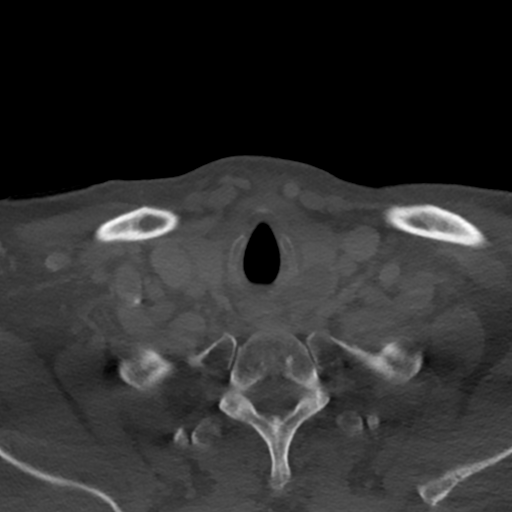
[im 60/119  bone]
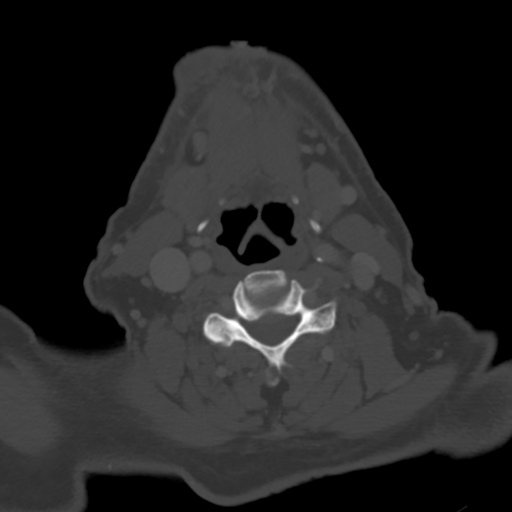

[Series 6: coronal st · coronal · 0.34mm/px · 3 of 101 slices shown]
[im 21/101  bone]
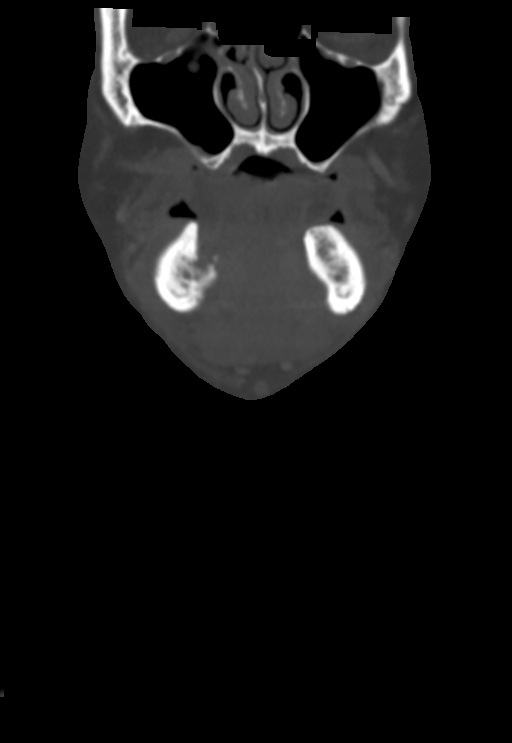
[im 41/101  bone]
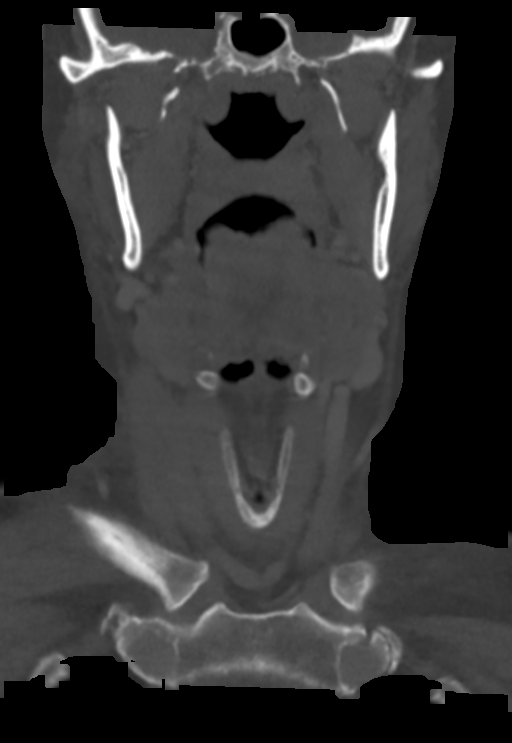
[im 61/101  bone]
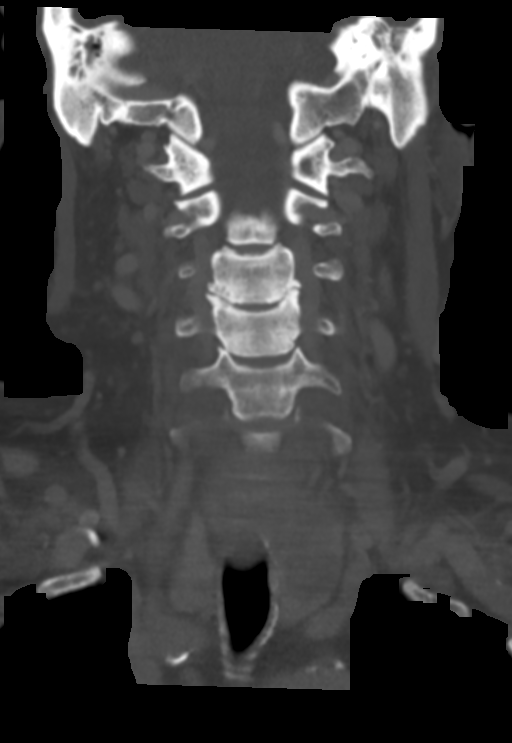

[Series 9: sagittal st · sagittal · 0.51mm/px · 5 of 95 slices shown, 6 images]
[im 32/95  bone]
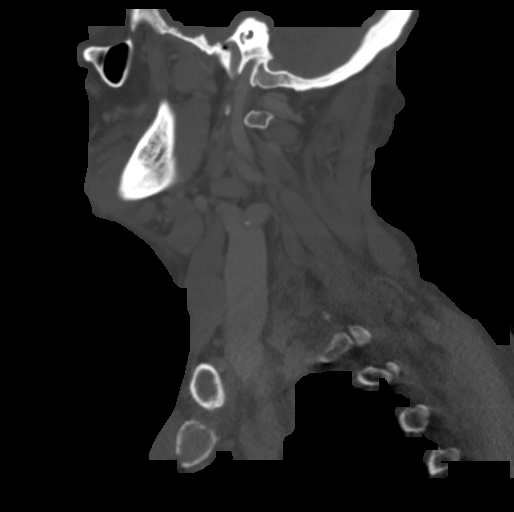
[im 40/95  bone]
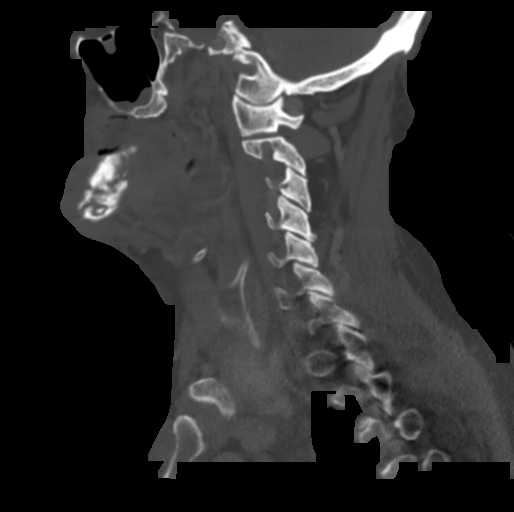
[im 48/95  soft-tissue]
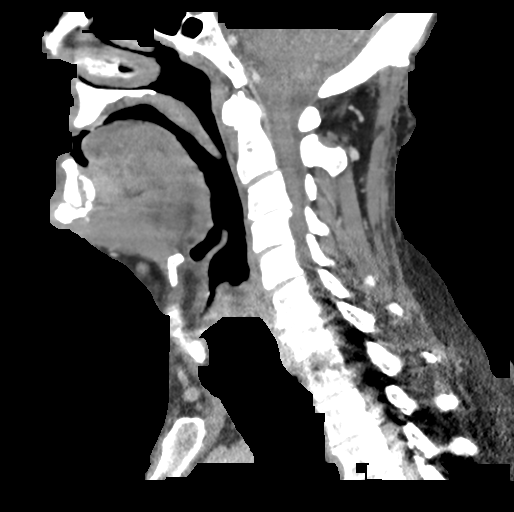
[im 48/95  bone]
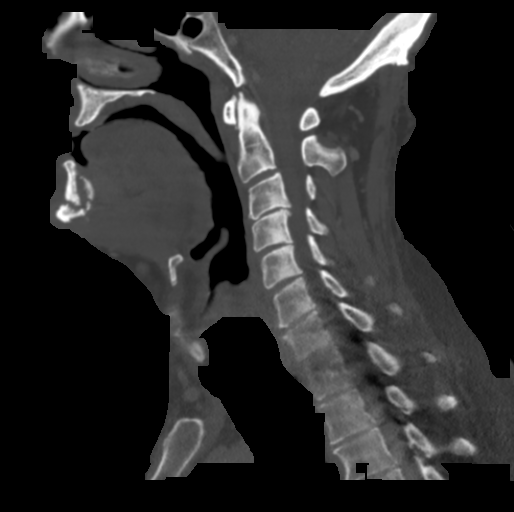
[im 55/95  bone]
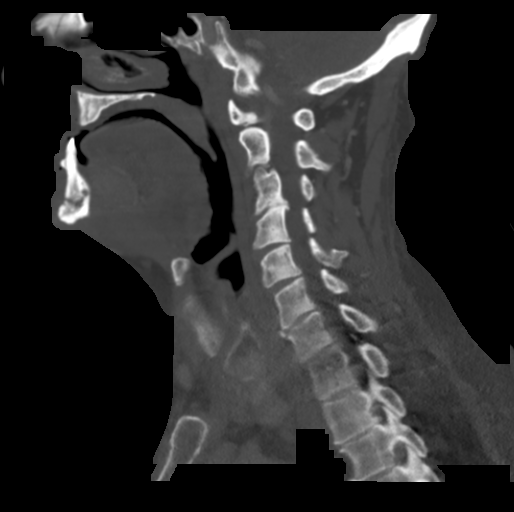
[im 63/95  bone]
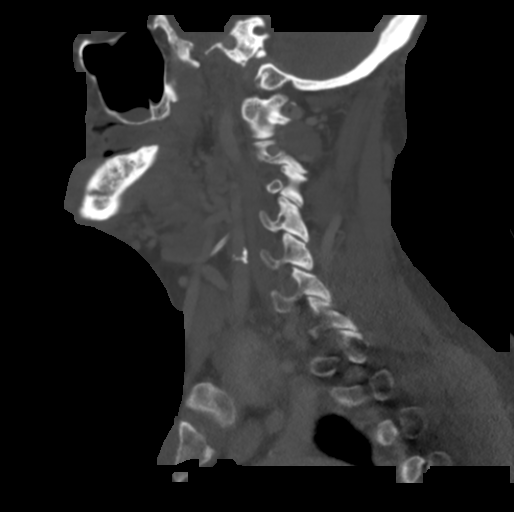

[Series 10: orthogonal st · axial · 0.39mm/px · z∈[-239,-92]mm · 4 of 124 slices shown, 5 images]
[im 25/124  soft-tissue]
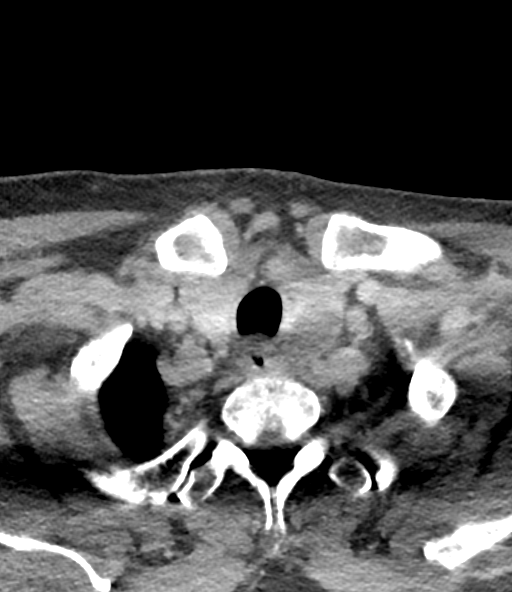
[im 25/124  bone]
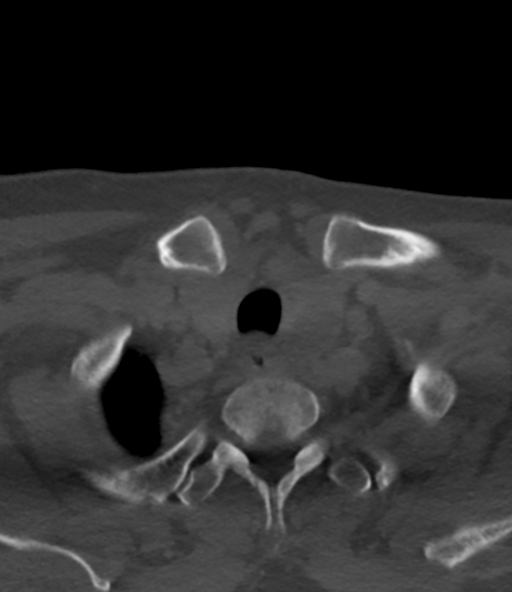
[im 50/124  bone]
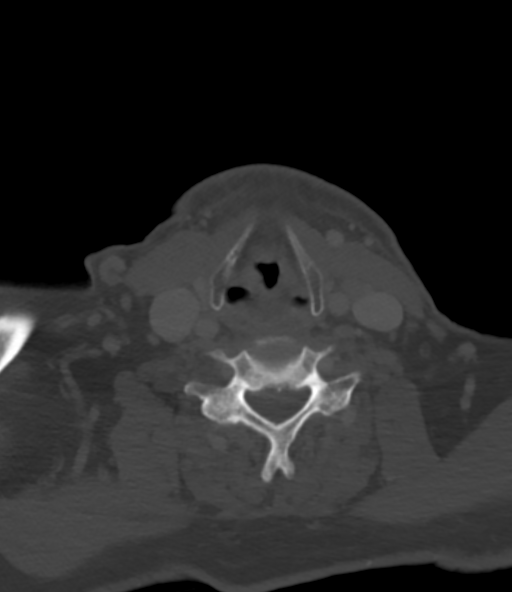
[im 74/124  bone]
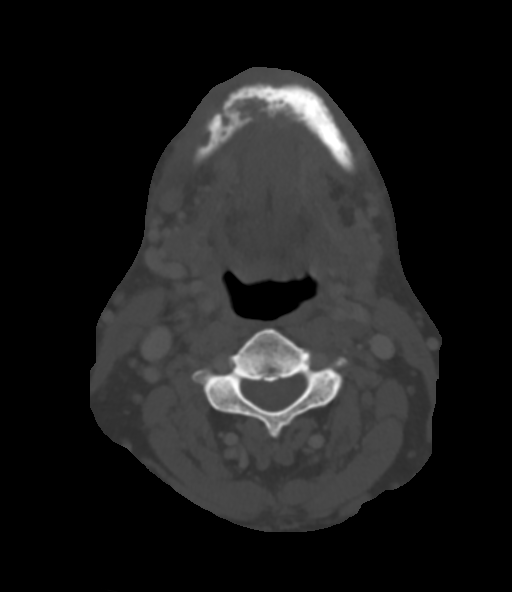
[im 99/124  bone]
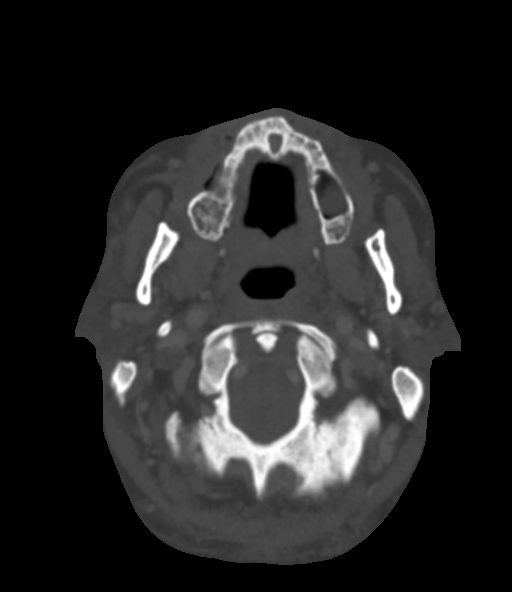

[14 of 33 positions shown; findings below may reference images not displayed]

FINDINGS: Pharynx and larynx: No evidence of mass lesion.

Salivary glands: Unremarkable

Thyroid: 3 cm (craniocaudal) solitary nodule in the left thyroid
gland. No invasive features or regional adenopathy.

Lymph nodes: Mild reactive enlargement of submental and
submandibular lymph nodes. No necrotic appearing nodes.

Vascular: Cervical carotid atherosclerosis without notable stenosis.
No venous thrombosis.

Limited intracranial: Negative

Visualized orbits: Negative

Mastoids and visualized paranasal sinuses: Bilateral mastoid
opacification, greater on the left with under pneumatization and
sclerosis.

Skeleton: There is mottled lucency within the mandibular symphysis
and anterior right body with chronic periosteal reaction and
fragmentation (a band of lucency across the diseased bone has the
appearance of fracture, but is incomplete). There is contiguous
thickening of regional soft tissues with a subcutaneous fluid
collection in the right chin measuring 2 cm and consistent with
abscess. There is also a 1cm rim enhancing collection around the
buccal surface of the right mandible seen on image 42. The alveolar
ridge is atrophic in the setting of nearly complete edentulous state
(3 remaining mandibular incisors are present and carious) and there
is mucosal thinning which could exposed bone. No mucosal based mass
is seen. There is no reported history of head neck cancer or
radiation therapy. No bisphosphonates on the patient's medication
list.

High-grade right C3-4 foraminal stenosis from uncovertebral
spurring.

Upper chest: Clear apical lungs.

Acute findings were called by telephone at the time of
interpretation on 11/07/2014 at [DATE] to Dr. REMY HYPPOLITE , who
verbally acknowledged these results.
IMPRESSION: 1. Mandibular osteomyelitis with 2 cm subcutaneous chin abscess and
1 cm subperiosteal abscess on the outer right mandible body.
2. 3 cm solitary thyroid nodule on the left. Recommend outpatient
sonography.
3. Chronic bilateral mastoiditis.

## 2017-07-15 ENCOUNTER — Other Ambulatory Visit: Payer: Self-pay | Admitting: Family

## 2017-07-15 ENCOUNTER — Encounter: Payer: Self-pay | Admitting: Family

## 2017-07-15 ENCOUNTER — Ambulatory Visit (INDEPENDENT_AMBULATORY_CARE_PROVIDER_SITE_OTHER): Payer: Medicare Other | Admitting: Family

## 2017-07-15 VITALS — BP 133/78 | HR 54 | Temp 97.8°F | Ht 67.0 in | Wt 265.0 lb

## 2017-07-15 DIAGNOSIS — M549 Dorsalgia, unspecified: Secondary | ICD-10-CM

## 2017-07-15 DIAGNOSIS — G8929 Other chronic pain: Secondary | ICD-10-CM

## 2017-07-15 DIAGNOSIS — I10 Essential (primary) hypertension: Secondary | ICD-10-CM | POA: Diagnosis not present

## 2017-07-15 DIAGNOSIS — E8881 Metabolic syndrome: Secondary | ICD-10-CM

## 2017-07-15 DIAGNOSIS — N183 Chronic kidney disease, stage 3 unspecified: Secondary | ICD-10-CM

## 2017-07-15 DIAGNOSIS — F419 Anxiety disorder, unspecified: Secondary | ICD-10-CM | POA: Diagnosis not present

## 2017-07-15 DIAGNOSIS — M272 Inflammatory conditions of jaws: Secondary | ICD-10-CM

## 2017-07-15 DIAGNOSIS — Z1211 Encounter for screening for malignant neoplasm of colon: Secondary | ICD-10-CM

## 2017-07-15 DIAGNOSIS — E041 Nontoxic single thyroid nodule: Secondary | ICD-10-CM | POA: Diagnosis not present

## 2017-07-15 DIAGNOSIS — E785 Hyperlipidemia, unspecified: Secondary | ICD-10-CM

## 2017-07-15 DIAGNOSIS — Z1212 Encounter for screening for malignant neoplasm of rectum: Secondary | ICD-10-CM

## 2017-07-15 NOTE — Patient Instructions (Signed)
Metabolic Syndrome Metabolic syndrome is the presence of at least three factors that increase your risk of getting cardiovascular disease and diabetes. These factors are:  High blood sugar.  High blood triglyceride level.  High blood pressure.  Low levels of good blood cholesterol (high-density lipoprotein or HDL).  Excess weight around the waist. This factor is present with a waist measurement of: ? More than 40 inches in men. ? More than 35 inches in women.  Metabolic syndrome is sometimes called insulin resistance syndrome and syndrome X. What are the causes? The exact cause is not known, but genetics and lifestyle choices play a role. What increases the risk? You are more likely to develop metabolic syndrome if:  You eat a diet high in calories and saturated fat.  You do not exercise regularly.  You are overweight.  You have a family history of metabolic syndrome.  You are Asian.  You are older in age.  You have insulin resistance.  You use any tobacco products, including cigarettes, chewing tobacco, or electronic cigarettes.  What are the signs or symptoms? Metabolic syndrome has no specific symptoms. How is this diagnosed? To make a diagnosis, your health care provider will determine whether you have at least three of the factors that make up metabolic syndrome by:  Taking your blood pressure.  Measuring your waist.  Ordering blood tests.  How is this treated? Treatment may include:  Lifestyle changes to reduce your risk for heart disease and stroke, such as: ? Exercise. ? Weight loss. ? Maintaining a healthy diet. ? Quitting the use of any tobacco products, including cigarettes, chewing tobacco, or electronic cigarettes.  Medicines that: ? Help your body to maintain glucose control. ? Reduce your blood pressure and your blood triglyceride levels.  Follow these instructions at home:  Exercise regularly.  Maintain a healthy diet.  Do not use any  tobacco products, including cigarettes, chewing tobacco, or electronic cigarettes. If you need help quitting, ask your health care provider.  Keep all follow-up visits as directed by your health care provider. This is important.  Measure your waist regularly and record the measurement. To measure your waist: ? Stand up straight. ? Breathe out. ? Wrap the measuring tape around the part of your waist that is just above your hipbones. ? Read the measurement. Contact a health care provider if:  You feel very tired.  You develop excessive thirst.  You pass large quantities of urine.  You put on weight around your waist.  You have headaches over and over again.  You have a dizzy spell. Get help right away if:  You develop sudden blurred vision.  You develop a sudden dizzy spell.  You have sudden trouble speaking or swallowing.  You have sudden weakness in your arm or leg.  You have chest pains or trouble breathing.  You feel like your heartbeat is abnormal.  You faint. This information is not intended to replace advice given to you by your health care provider. Make sure you discuss any questions you have with your health care provider. Document Released: 06/17/2007 Document Revised: 08/16/2015 Document Reviewed: 10/14/2013 Elsevier Interactive Patient Education  2018 Elsevier Inc.  

## 2017-07-15 NOTE — Progress Notes (Signed)
Subjective:    Patient ID: Marcus Carr, male    DOB: 10-28-1940, 77 y.o.   MRN: [id number]  Pt presents to the office today for chronic follow up. PT has history of osteomyelitis of mandible, doing stable at this time. Pt is followed by Pain Management every 3 months for chronic back pain.   PT reports he was told he had a thyroid nodule on CT scan in 2016. States he has never followed up on this.  Hypertension  This is a chronic problem. The current episode started more than 1 year ago. The problem has been resolved since onset. The problem is controlled. Associated symptoms include anxiety and malaise/fatigue. Pertinent negatives include no peripheral edema or shortness of breath. Risk factors for coronary artery disease include dyslipidemia, obesity and sedentary lifestyle. The current treatment provides moderate improvement. There is no history of CAD/MI or heart failure.  Hyperlipidemia  This is a chronic problem. The current episode started more than 1 year ago. The problem is controlled. Recent lipid tests were reviewed and are normal. Exacerbating diseases include obesity. Pertinent negatives include no shortness of breath. Current antihyperlipidemic treatment includes statins. The current treatment provides moderate improvement of lipids. Risk factors for coronary artery disease include dyslipidemia, family history, male sex, obesity and a sedentary lifestyle.  Anxiety  Presents for follow-up visit. Symptoms include excessive worry and nervous/anxious behavior. Patient reports no irritability or shortness of breath. Symptoms occur occasionally. The quality of sleep is good.    Arthritis  Presents for follow-up visit. He complains of pain and stiffness. Affected location: back. His pain is at a severity of 3/10.  Metabolic Syndrome PT admits that he is not active, but states he is thinking about joining the [facility]. Takes Lipitor daily.    Review of Systems  Constitutional:  Positive for malaise/fatigue. Negative for irritability.  Respiratory: Negative for shortness of breath.   Musculoskeletal: Positive for arthritis and stiffness.  Psychiatric/Behavioral: The patient is nervous/anxious.   All other systems reviewed and are negative.      Objective:   Physical Exam  Constitutional: He is oriented to person, place, and time. He appears well-developed and well-nourished. No distress.  HENT:  Head: Normocephalic.  Right Ear: External ear normal.  Left Ear: External ear normal.  Nose: Nose normal.  Mouth/Throat: Oropharynx is clear and moist.  Eyes: Pupils are equal, round, and reactive to light. Right eye exhibits no discharge. Left eye exhibits no discharge.  Neck: Normal range of motion. Neck supple. No thyromegaly present.  Cardiovascular: Normal rate, regular rhythm, normal heart sounds and intact distal pulses.  No murmur heard. Pulmonary/Chest: Effort normal and breath sounds normal. No respiratory distress. He has no wheezes.  Abdominal: Soft. Bowel sounds are normal. He exhibits no distension. There is no tenderness.  Musculoskeletal: Normal range of motion. He exhibits no edema or tenderness.  Neurological: He is alert and oriented to person, place, and time.  Skin: Skin is warm and dry. No rash noted. No erythema.  Psychiatric: He has a normal mood and affect. His behavior is normal. Judgment and thought content normal.  Vitals reviewed.    BP 133/78   Pulse (!) 54   Temp 97.8 F (36.6 C) (Oral)   Ht '5\' 7"'  (1.702 m)   Wt 265 lb (120.2 kg)   BMI 41.50 kg/m      Assessment & Plan:  1. Essential hypertension - CMP14+EGFR  2. Stage 3 chronic kidney disease ([place]) - CMP14+EGFR  3.  Morbid obesity ([place]) - CMP14+EGFR  4. Chronic bilateral back pain, unspecified back location  - RVU02+BXID  5. Metabolic syndrome - [address]+UOHF - Lipid panel  6. Colon cancer screening  - Cologuard - CMP14+EGFR  7. Screening for malignant  neoplasm of the rectum  - Cologuard - CMP14+EGFR  8. Osteomyelitis of mandible  - CMP14+EGFR  9. Hyperlipidemia, unspecified hyperlipidemia type - CMP14+EGFR - Lipid panel  10. Anxiety  - CMP14+EGFR  11. Thyroid nodule  - CMP14+EGFR - US THYROID; Future   Continue all meds Labs pending Health Maintenance reviewed Diet and exercise encouraged RTO 6 months   [name], FNP

## 2017-07-16 LAB — CMP14+EGFR
ALBUMIN: 4.3 g/dL (ref 3.5–4.8)
ALK PHOS: 82 IU/L (ref 39–117)
ALT: 18 IU/L (ref 0–44)
AST: 26 IU/L (ref 0–40)
Albumin/Globulin Ratio: 1.7 (ref 1.2–2.2)
BUN / CREAT RATIO: 16 (ref 10–24)
BUN: 23 mg/dL (ref 8–27)
Bilirubin Total: 0.5 mg/dL (ref 0.0–1.2)
CO2: 26 mmol/L (ref 20–29)
Calcium: 9.6 mg/dL (ref 8.6–10.2)
Chloride: 102 mmol/L (ref 96–106)
Creatinine, Ser: 1.43 mg/dL — ABNORMAL HIGH (ref 0.76–1.27)
GFR calc Af Amer: 55 mL/min/{1.73_m2} — ABNORMAL LOW (ref >59)
GFR calc non Af Amer: 48 mL/min/{1.73_m2} — ABNORMAL LOW (ref >59)
GLUCOSE: 96 mg/dL (ref 65–99)
Globulin, Total: 2.5 g/dL (ref 1.5–4.5)
Potassium: 4.6 mmol/L (ref 3.5–5.2)
Sodium: 142 mmol/L (ref 134–144)
Total Protein: 6.8 g/dL (ref 6.0–8.5)

## 2017-07-16 LAB — LIPID PANEL
CHOLESTEROL TOTAL: 133 mg/dL (ref 100–199)
Chol/HDL Ratio: 3.9 ratio (ref 0.0–5.0)
HDL: 34 mg/dL — ABNORMAL LOW (ref >39)
LDL CALC: 74 mg/dL (ref 0–99)
TRIGLYCERIDES: 125 mg/dL (ref 0–149)
VLDL Cholesterol Cal: 25 mg/dL (ref 5–40)

## 2017-07-20 DIAGNOSIS — Z79891 Long term (current) use of opiate analgesic: Secondary | ICD-10-CM | POA: Diagnosis not present

## 2017-07-27 ENCOUNTER — Ambulatory Visit
Admission: RE | Admit: 2017-07-27 | Discharge: 2017-07-27 | Disposition: A | Discharge status: Home / Self Care | Payer: Medicare Other | Source: Ambulatory Visit | Attending: Family | Admitting: Family

## 2017-07-27 DIAGNOSIS — E041 Nontoxic single thyroid nodule: Secondary | ICD-10-CM

## 2017-07-28 ENCOUNTER — Other Ambulatory Visit: Payer: Self-pay | Admitting: Family

## 2017-07-28 DIAGNOSIS — E041 Nontoxic single thyroid nodule: Secondary | ICD-10-CM

## 2017-08-12 ENCOUNTER — Ambulatory Visit (HOSPITAL_COMMUNITY)
Admission: RE | Admit: 2017-08-12 | Discharge: 2017-08-12 | Disposition: A | Discharge status: Home / Self Care | Payer: Medicare Other | Source: Ambulatory Visit | Attending: Family | Admitting: Family

## 2017-08-12 ENCOUNTER — Encounter (HOSPITAL_COMMUNITY): Payer: Self-pay

## 2017-08-12 DIAGNOSIS — E041 Nontoxic single thyroid nodule: Secondary | ICD-10-CM | POA: Diagnosis not present

## 2017-08-12 MED ORDER — LIDOCAINE HCL (PF) 2 % IJ SOLN
INTRAMUSCULAR | Status: AC
  Administered 2017-08-12: 10 mL
  Filled 2017-08-12: qty 20

## 2017-08-12 NOTE — Progress Notes (Signed)
Procedure completed,  2 samples will be sent to lab. Deep, difficult nodule to biopsy. Pressure held, post ultra sound complete. Band aid applied. No complaints from pt.

## 2017-08-12 NOTE — Procedures (Signed)
PreOperative Dx: LT thyroid nodule Postoperative Dx: LT thyroid nodule Procedure:   US guided FNA of LT thyroid nodule Radiologist:  Thornton Papas Anesthesia:  3.5 ml of 2% lidocaine Specimen:  FNA x 2 with 22g spinal needles  EBL:   < 1 ml Complications: Small hematoma within and surrounding the mid to inferior LEFT thyroid lobe

## 2017-08-20 ENCOUNTER — Telehealth: Payer: Self-pay | Admitting: Family

## 2017-08-20 NOTE — Telephone Encounter (Signed)
Have you seen the results for this biopsy?

## 2017-08-20 NOTE — Telephone Encounter (Signed)
Benign nodule

## 2017-08-20 NOTE — Telephone Encounter (Signed)
Family aware of results 

## 2017-10-04 ENCOUNTER — Other Ambulatory Visit: Payer: Self-pay | Admitting: Family

## 2017-10-04 DIAGNOSIS — I10 Essential (primary) hypertension: Secondary | ICD-10-CM

## 2017-10-06 ENCOUNTER — Encounter: Payer: Self-pay | Admitting: *Deleted

## 2017-10-06 NOTE — Telephone Encounter (Signed)
RTC 6 mos (Oct) per 07/15/17 OV, not put on Rx to make appt

## 2017-10-23 ENCOUNTER — Other Ambulatory Visit: Payer: Self-pay | Admitting: Family

## 2017-10-23 DIAGNOSIS — E785 Hyperlipidemia, unspecified: Secondary | ICD-10-CM

## 2017-10-25 ENCOUNTER — Other Ambulatory Visit: Payer: Self-pay | Admitting: Family

## 2017-10-25 DIAGNOSIS — E785 Hyperlipidemia, unspecified: Secondary | ICD-10-CM

## 2017-11-03 ENCOUNTER — Other Ambulatory Visit: Payer: Self-pay | Admitting: Family

## 2017-11-03 DIAGNOSIS — I10 Essential (primary) hypertension: Secondary | ICD-10-CM

## 2017-11-17 DIAGNOSIS — Z79891 Long term (current) use of opiate analgesic: Secondary | ICD-10-CM | POA: Diagnosis not present

## 2018-01-03 ENCOUNTER — Other Ambulatory Visit: Payer: Self-pay | Admitting: Family

## 2018-01-03 DIAGNOSIS — I10 Essential (primary) hypertension: Secondary | ICD-10-CM

## 2018-01-04 NOTE — Telephone Encounter (Signed)
Last seen 4/19 

## 2018-01-05 ENCOUNTER — Other Ambulatory Visit: Payer: Self-pay | Admitting: Family

## 2018-01-06 NOTE — Telephone Encounter (Signed)
Last seen 07/15/17

## 2018-01-17 ENCOUNTER — Other Ambulatory Visit: Payer: Self-pay | Admitting: Family

## 2018-01-17 DIAGNOSIS — E785 Hyperlipidemia, unspecified: Secondary | ICD-10-CM

## 2018-01-18 NOTE — Telephone Encounter (Signed)
Last lipid 07/15/17 

## 2018-01-27 ENCOUNTER — Other Ambulatory Visit: Payer: Self-pay | Admitting: Family

## 2018-01-27 DIAGNOSIS — I10 Essential (primary) hypertension: Secondary | ICD-10-CM

## 2018-01-27 NOTE — Telephone Encounter (Signed)
Last seen 07/15/17

## 2018-02-11 ENCOUNTER — Ambulatory Visit (INDEPENDENT_AMBULATORY_CARE_PROVIDER_SITE_OTHER): Payer: Medicare Other | Admitting: Family

## 2018-02-11 ENCOUNTER — Encounter: Payer: Self-pay | Admitting: Family

## 2018-02-11 VITALS — BP 156/86 | HR 60 | Temp 97.2°F | Ht 67.0 in | Wt 271.0 lb

## 2018-02-11 DIAGNOSIS — K130 Diseases of lips: Secondary | ICD-10-CM | POA: Diagnosis not present

## 2018-02-11 NOTE — Patient Instructions (Signed)
Squamous Cell Carcinoma Squamous cell carcinoma is a common form of skin cancer. It begins in the squamous cells in the outer layer of the skin (epidermis). It occurs most often in parts of the body that are frequently exposed to the sun, such as the face, lips, neck, arms, legs, and hands. However, this condition can occur anywhere on the body, including the inside of the mouth, sites of long-term (chronic) scarring, and the anus. If squamous cell carcinoma is treated soon enough, it rarely spreads to other areas of the body (metastasizes). If it is not treated, it can destroy nearby tissues. In rare cases, it can spread to other areas. What are the causes? This condition is usually caused by exposure to ultraviolet (UV) light. UV light may come from the sun or from tanning beds. Other causes include:  Exposure to arsenic.  Exposure to radiation.  Exposure to toxic tars and oils.  Certain genetic conditions, such as xeroderma pigmentosum.  What increases the risk? This condition is more likely to develop in:  People who are older than 76 years of age.  People who have fair skin (light complexion).  People who have blonde or red hair.  People who have blue, green, or gray eyes.  People who have childhood freckling.  People who have had sun exposure over long periods of time, especially during childhood.  People who have had repeated sunburns.  People who have a weakened immune system.  People who have been exposed to certain chemicals, such as tar, soot, and arsenic.  People who have chronic inflammatory conditions.  People who have chronic infections.  People who have an HPV (human papillomavirus) infection.  People who have conditions that cause chronic scarring. These can include burn scars, chronic ulcers, heat (thermal) injuries, and radiation.  People who have had psoralen and ultraviolet A (PUVA) treatments.  People who smoke.  People who use tanning  beds.  What are the signs or symptoms? This condition often starts as small pink or brown growths on the skin. The growths have a rough surface that may feel like sandpaper. In some cases, the growths are easier to feel than to see. The growths may develop into a sore that does not heal. How is this diagnosed? This condition may be diagnosed with:  A physical exam.  Removal of a tissue sample to be examined under a microscope (biopsy).  How is this treated? Treatment for this condition involves removing the cancerous tissue. The method that is used for this depends on the size and location of the tumors, as well as your overall health. Possible treatments include:  Mohs surgery. In this procedure, the cancerous skin cells are removed layer by layer until all of the tumor has been removed.  Surgical removal (excision) of the tumor. This involves removing the entire tumor and a small amount of normal skin that surrounds it.  Cryosurgery. This involves freezing the tumor with liquid nitrogen.  Plastic surgery. The tumor is removed, and healthy skin from another part of the body is used to cover the wound. This may be done for large tumors that are in areas where it is not possible to stretch the nearby skin to sew the edges of the wound together.  Radiation. This may be used for tumors on the face.  Photodynamic therapy. A chemical cream is applied to the skin, and light exposure is used to activate the chemical.  Electrodesiccation and curettage. This involves alternately scraping and burning the tumor while  using an electric current to control bleeding.  Follow these instructions at home:  Avoid unprotected sun exposure.  Do self-exams as told by your health care provider. Look for new spots or changes in your skin.  Keep all follow-up visits as told by your health care provider. This is important.  Do not use tobacco products, including cigarettes, chewing tobacco, or  e-cigarettes. If you need help quitting, ask your health care provider. How is this prevented?  Avoid the sun when it is the strongest. This is usually between 10:00 a.m. and 4:00 p.m.  When you are out in the sun, use a sunscreen that has a sun protection factor (SPF) of at least 12.  Apply sunscreen at least 30 minutes before exposure to the sun.  Reapply sunscreen every 2-4 hours while you are outside. Also reapply it after swimming and after excessive sweating.  Always wear hats, protective clothing, and UV-blocking sunglasses when you are outdoors.  Do not use tanning beds. Contact a health care provider if:  You notice any new growths or any changes in your skin.  You have had a squamous cell carcinoma tumor removed and you notice a new growth in the same location. This information is not intended to replace advice given to you by your health care provider. Make sure you discuss any questions you have with your health care provider. Document Released: 09/14/2002 Document Revised: 11/06/2015 Document Reviewed: 07/03/2014 Elsevier Interactive Patient Education  [employer].

## 2018-02-11 NOTE — Progress Notes (Signed)
   Subjective:    Patient ID: Marcus Carr, male    DOB: May 29, 1940, 77 y.o.   MRN: [id number]  Chief Complaint  Patient presents with  . sore on lip    Patient states it has been there x 1 year and has not went away.    HPI Pt presents to the office today with a lesion on left lower lip that he noticed about a year ago. He states he would usually have a cold sore in the winter, but it would heal during the summer. However, over the summer he states this has not healed. He has tried multiple OTC creams with no relief.   States if the scab comes off it will bleed. Denies any pain.   He quit smoking about 6 years. Denies any smokeless tobacco.    Review of Systems  Skin: Positive for wound.  All other systems reviewed and are negative.      Objective:   Physical Exam  Constitutional: He is oriented to person, place, and time. He appears well-developed and well-nourished. No distress.  HENT:  Head: Normocephalic.    lesion on lower left lip, scabbed  Eyes: Pupils are equal, round, and reactive to light. Right eye exhibits no discharge. Left eye exhibits no discharge.  Neck: Normal range of motion. Neck supple. No thyromegaly present.  Cardiovascular: Normal rate, regular rhythm, normal heart sounds and intact distal pulses.  No murmur heard. Pulmonary/Chest: Effort normal and breath sounds normal. No respiratory distress. He has no wheezes.  Abdominal: Soft. Bowel sounds are normal. He exhibits no distension. There is no tenderness.  Musculoskeletal: Normal range of motion. He exhibits no edema or tenderness.  Neurological: He is alert and oriented to person, place, and time. He has normal reflexes. No cranial nerve deficit.  Skin: Skin is warm and dry. No rash noted. No erythema.  Psychiatric: He has a normal mood and affect. His behavior is normal. Judgment and thought content normal.  Vitals reviewed.     BP (!) 156/86   Pulse 60   Temp (!) 97.2 F (36.2 C)  (Oral)   Ht 5\' 7"  (1.702 m)   Wt 271 lb (122.9 kg)   BMI 42.44 kg/m      Assessment & Plan:  Marcus Carr comes in today with chief complaint of sore on lip (Patient states it has been there x 1 year and has not went away.)   Diagnosis and orders addressed:  1. Lip lesion Do not pick Referral to Dermatologists pending Worrisome for cancer Call the office if they have had not heard from Dermatologists in one week - Ambulatory referral to Dermatology   [name], FNP

## 2018-03-09 DIAGNOSIS — C4402 Squamous cell carcinoma of skin of lip: Secondary | ICD-10-CM | POA: Diagnosis not present

## 2018-03-23 DIAGNOSIS — Z79891 Long term (current) use of opiate analgesic: Secondary | ICD-10-CM | POA: Diagnosis not present

## 2018-04-01 ENCOUNTER — Other Ambulatory Visit: Payer: Self-pay | Admitting: Family

## 2018-04-05 ENCOUNTER — Other Ambulatory Visit: Payer: Self-pay | Admitting: Family

## 2018-04-05 DIAGNOSIS — I10 Essential (primary) hypertension: Secondary | ICD-10-CM

## 2018-04-13 DIAGNOSIS — C4402 Squamous cell carcinoma of skin of lip: Secondary | ICD-10-CM | POA: Diagnosis not present

## 2018-04-14 ENCOUNTER — Ambulatory Visit (INDEPENDENT_AMBULATORY_CARE_PROVIDER_SITE_OTHER): Payer: Medicare Other | Admitting: Family Medicine

## 2018-04-14 ENCOUNTER — Encounter: Payer: Self-pay | Admitting: Family Medicine

## 2018-04-14 VITALS — BP 135/74 | HR 55 | Temp 96.8°F | Ht 67.0 in | Wt 273.0 lb

## 2018-04-14 DIAGNOSIS — M25562 Pain in left knee: Secondary | ICD-10-CM

## 2018-04-14 MED ORDER — DICLOFENAC SODIUM 1 % TD GEL: 4.0000 g | Freq: Four times a day (QID) | TRANSDERMAL | 1 refills | Status: AC

## 2018-04-14 NOTE — Patient Instructions (Signed)
Because of your kidney function I do not really want to using the ibuprofen.  You may continue using the Percocet as prescribed by Dr. Nelva Bush.  I have prescribed you a pain and anti-inflammatory cream that you can apply to the knee up to 4 times daily if you need to.  If you have no improvement in your symptoms then I would recommend you see your orthopedist or follow-up here in office with Christy.  Avoid things that aggravate the knee pain.  You may apply ice to help with any swelling or discomfort.   Acute Knee Pain, Adult Many things can cause knee pain. Sometimes, knee pain is sudden (acute) and may be caused by damage, swelling, or irritation of the muscles and tissues that support your knee. The pain often goes away on its own with time and rest. If the pain does not go away, tests may be done to find out what is causing the pain. Follow these instructions at home: Pay attention to any changes in your symptoms. Take these actions to relieve your pain. If you have a knee sleeve or brace:   Wear the sleeve or brace as told by your doctor. Remove it only as told by your doctor.  Loosen the sleeve or brace if your toes: ? Tingle. ? Become numb. ? Turn cold and blue.  Keep the sleeve or brace clean.  If the sleeve or brace is not waterproof: ? Do not let it get wet. ? Cover it with a watertight covering when you take a bath or shower. Activity  Rest your knee.  Do not do things that cause pain.  Avoid activities where both feet leave the ground at the same time (high-impact activities). Examples are running, jumping rope, and doing jumping jacks.  Work with a physical therapist to make a safe exercise program, as told by your doctor. Managing pain, stiffness, and swelling   If told, put ice on the knee: ? Put ice in a plastic bag. ? Place a towel between your skin and the bag. ? Leave the ice on for 20 minutes, 2-3 times a day.  If told, put pressure (compression) on your  injured knee to control swelling, give support, and help with discomfort. Compression may be done with an elastic bandage. General instructions  Take all medicines only as told by your doctor.  Raise (elevate) your knee while you are sitting or lying down. Make sure your knee is higher than your heart.  Sleep with a pillow under your knee.  Do not use any products that contain nicotine or tobacco. These include cigarettes, e-cigarettes, and chewing tobacco. These products may slow down healing. If you need help quitting, ask your doctor.  If you are overweight, work with your doctor and a food expert (dietitian) to set goals to lose weight. Being overweight can make your knee hurt more.  Keep all follow-up visits as told by your doctor. This is important. Contact a doctor if:  The knee pain does not stop.  The knee pain changes or gets worse.  You have a fever along with knee pain.  Your knee feels warm when you touch it.  Your knee gives out or locks up. Get help right away if:  Your knee swells, and the swelling gets worse.  You cannot move your knee.  You have very bad knee pain. Summary  Many things can cause knee pain. The pain often goes away on its own with time and rest.  Your  doctor may do tests to find out the cause of the pain.  Pay attention to any changes in your symptoms. Relieve your pain with rest, medicines, light activity, and use of ice.  Get help right away if you cannot move your knee or your knee pain is very bad. This information is not intended to replace advice given to you by your health care provider. Make sure you discuss any questions you have with your health care provider. Document Released: 06/06/2008 Document Revised: 08/20/2017 Document Reviewed: 08/20/2017 Elsevier Interactive Patient Education  2019 [employer].

## 2018-04-14 NOTE — Progress Notes (Signed)
Subjective: CC: Left knee pain PCP: Sharion Balloon, FNP KXF:GHWE Marcus Carr is a 78 y.o. male presenting to clinic today for:  1. Left knee pain Patient reports a 2-week history of left-sided knee pain.  He points the anterior lateral aspect of the knee and states that the pain is moderate and is exacerbated by extension of knee/ climbing stairs. Denies swelling, discoloration, locking or popping. Denies instability.  He has been taking intermittent ibuprofen which does seem to help and his Percocet, which is prescribed for his back by Dr. Nelva Bush.  He denies any preceding injury or history of knee pain in the past.  Certainly no falls.  He may have twisted it which may have caused the pain but he cannot identify any instigating event.  Past medical history significant for CKD.   ROS: Per HPI  Allergies  Allergen Reactions  . Penicillins Rash   Past Medical History:  Diagnosis Date  . Abscess, jaw   . Anxiety   . Back pain    chronic back pain treated by Dr. Nelva Bush  . Chronic kidney disease   . Chronic mastoiditis of both sides 11/07/2014  . Cigarette smoker 11/07/2014  . Gout   . Hyperlipidemia   . Hypertension   . Morbid obesity ([place]) 11/07/2014  . Osteomyelitis of mandible 11/07/2014  . Poor dentition 11/07/2014  . Thyroid nodule 11/07/2014    Current Outpatient Medications:  .  aspirin 325 MG tablet, Take 325 mg by mouth daily., Disp: , Rfl:  .  atorvastatin (LIPITOR) 20 MG tablet, TAKE 1 TABLET BY MOUTH EVERY DAY, Disp: 90 tablet, Rfl: 0 .  cholecalciferol (VITAMIN D) 400 UNITS TABS tablet, Take 800 Units by mouth daily., Disp: , Rfl:  .  citalopram (CELEXA) 20 MG tablet, Take 1 tablet (20 mg total) by mouth daily. (Needs to be seen before next refill), Disp: 30 tablet, Rfl: 0 .  fish oil-omega-3 fatty acids 1000 MG capsule, Take 1 g by mouth 2 (two) times daily. , Disp: , Rfl:  .  hydrochlorothiazide (MICROZIDE) 12.5 MG capsule, TAKE 1 CAPSULE BY MOUTH EVERY DAY, Disp: 90  capsule, Rfl: 0 .  oxyCODONE-acetaminophen (PERCOCET) 10-325 MG per tablet, Take 1 tablet by mouth every 12 (twelve) hours as needed., Disp: 90 tablet, Rfl: 0 .  Specialty Vitamins Products (MAGNESIUM, AMINO ACID CHELATE,) 133 MG tablet, Take 1 tablet by mouth daily., Disp: , Rfl:  .  verapamil (CALAN-SR) 180 MG CR tablet, TAKE 1 TABLET (180 MG TOTAL) BY MOUTH AT BEDTIME., Disp: 90 tablet, Rfl: 0 Social History   Socioeconomic History  . Marital status: Married    Spouse name: Not on file  . Number of children: 2  . Years of education: Not on file  . Highest education level: Not on file  Occupational History  . Not on file  Social Needs  . Financial resource strain: Not on file  . Food insecurity:    Worry: Not on file    Inability: Not on file  . Transportation needs:    Medical: Not on file    Non-medical: Not on file  Tobacco Use  . Smoking status: Former Smoker    Packs/day: 1.50    Years: 50.00    Pack years: 75.00    Types: Cigarettes    Last attempt to quit: 10/24/2014    Years since quitting: 3.4  . Smokeless tobacco: Never Used  Substance and Sexual Activity  . Alcohol use: No    Alcohol/week:  0.0 standard drinks  . Drug use: No  . Sexual activity: Not on file  Lifestyle  . Physical activity:    Days per week: Not on file    Minutes per session: Not on file  . Stress: Not on file  Relationships  . Social connections:    Talks on phone: Not on file    Gets together: Not on file    Attends religious service: Not on file    Active member of club or organization: Not on file    Attends meetings of clubs or organizations: Not on file    Relationship status: Not on file  . Intimate partner violence:    Fear of current or ex partner: Not on file    Emotionally abused: Not on file    Physically abused: Not on file    Forced sexual activity: Not on file  Other Topics Concern  . Not on file  Social History Narrative   Lives wife.  Two daughters.     Family  History  Problem Relation Age of Onset  . Cancer Mother        esophageal  . Heart attack Father 71    Objective: Office vital signs reviewed. BP 135/74   Pulse (!) 55   Temp (!) 96.8 F (36 C) (Oral)   Ht 5\' 7"  (1.702 m)   Wt 273 lb (123.8 kg)   BMI 42.76 kg/m   Physical Examination:  General: Awake, alert, obese, No acute distress Extremities: warm, well perfused, No edema, cyanosis or clubbing; +2 pulses bilaterally MSK: Normal gait and normal station  Left knee: No gross discoloration, joint effusion or soft tissue swelling appreciated.  He has full active range of motion but does have some pain with extension and flexion of the knee.  He has no tenderness to palpation to the patella, patellar tendon, quad tendon, joint line or posterior popliteal fossa.  No palpable posterior popliteal masses.  No ligamentous laxity.  Is mild pain with [place]. Neuro: 5/5 LE Strength and light touch sensation grossly intact  Assessment/ Plan: 78 y.o. male   1. Acute pain of left knee Acute issue seems to be relieved by oral NSAIDs of patient with CKD.  Of advised against use of oral NSAIDs.  Okay to use Percocet which is prescribed by his orthopedist if needed as directed.  I have recommended use of topical Voltaren gel, which she may apply to the affected knee up to 4 times daily if needed.  Okay to use knee brace if needed for any swelling but this was not observed during today's visit.  I suspect that knee pain is likely related to degenerative changes within the left knee given morbid obesity.  We discussed that if topical gel does not improve symptoms/relieve symptoms, I would recommend evaluation by orthopedics versus return here to office for reevaluation.  At that time, could consider obtaining plain films of the knee. - diclofenac sodium (VOLTAREN) 1 % GEL; Apply 4 g topically 4 (four) times daily. To left knee for pain  Dispense: 200 g; Refill: 1   No orders of the defined types were  placed in this encounter.  Meds ordered this encounter  Medications  . diclofenac sodium (VOLTAREN) 1 % GEL    Sig: Apply 4 g topically 4 (four) times daily. To left knee for pain    Dispense:  200 g    Refill:  [place], DO [facility] (  [phone]   

## 2018-04-18 ENCOUNTER — Other Ambulatory Visit: Payer: Self-pay | Admitting: Family

## 2018-04-18 DIAGNOSIS — E785 Hyperlipidemia, unspecified: Secondary | ICD-10-CM

## 2018-04-23 ENCOUNTER — Other Ambulatory Visit: Payer: Self-pay

## 2018-04-23 DIAGNOSIS — E785 Hyperlipidemia, unspecified: Secondary | ICD-10-CM

## 2018-04-23 MED ORDER — ATORVASTATIN CALCIUM 20 MG PO TABS: 20.0000 mg | ORAL_TABLET | Freq: Every day | ORAL | 0 refills | Status: DC

## 2018-04-23 NOTE — Telephone Encounter (Signed)
Last lipid 07/15/17

## 2018-04-26 ENCOUNTER — Other Ambulatory Visit: Payer: Self-pay | Admitting: Family

## 2018-04-26 DIAGNOSIS — I10 Essential (primary) hypertension: Secondary | ICD-10-CM

## 2018-04-29 ENCOUNTER — Other Ambulatory Visit: Payer: Self-pay | Admitting: Family

## 2018-05-11 ENCOUNTER — Other Ambulatory Visit: Payer: Self-pay | Admitting: Family

## 2018-05-11 DIAGNOSIS — I10 Essential (primary) hypertension: Secondary | ICD-10-CM

## 2018-05-14 DIAGNOSIS — M25562 Pain in left knee: Secondary | ICD-10-CM | POA: Diagnosis not present

## 2018-06-04 ENCOUNTER — Other Ambulatory Visit: Payer: Self-pay | Admitting: Family

## 2018-06-04 DIAGNOSIS — I10 Essential (primary) hypertension: Secondary | ICD-10-CM

## 2018-06-07 MED ORDER — HYDROCHLOROTHIAZIDE 12.5 MG PO CAPS: 12.5000 mg | ORAL_CAPSULE | Freq: Every day | ORAL | 0 refills | Status: DC

## 2018-06-07 NOTE — Telephone Encounter (Signed)
[place]. NTBS. 30 days given 05/11/18

## 2018-06-07 NOTE — Addendum Note (Signed)
Addended by: Zannie Cove on: 06/07/2018 03:32 PM   Modules accepted: Orders

## 2018-06-10 ENCOUNTER — Telehealth: Payer: Self-pay | Admitting: Family

## 2018-06-10 DIAGNOSIS — I10 Essential (primary) hypertension: Secondary | ICD-10-CM

## 2018-06-10 MED ORDER — HYDROCHLOROTHIAZIDE 12.5 MG PO CAPS: 12.5000 mg | ORAL_CAPSULE | Freq: Every day | ORAL | 0 refills | Status: DC

## 2018-06-10 NOTE — Telephone Encounter (Signed)
done

## 2018-06-10 NOTE — Telephone Encounter (Signed)
What is the name of the medication? HCTZ   Have you contacted your pharmacy to request a refill? yes  Which pharmacy would you like this sent to? [facility] days   Patient notified that their request is being sent to the clinical staff for review and that they should receive a call once it is complete. If they do not receive a call within 24 hours they can check with their pharmacy or our office.

## 2018-07-07 ENCOUNTER — Other Ambulatory Visit: Payer: Self-pay | Admitting: Family

## 2018-07-07 DIAGNOSIS — I10 Essential (primary) hypertension: Secondary | ICD-10-CM

## 2018-07-31 ENCOUNTER — Other Ambulatory Visit: Payer: Self-pay | Admitting: Family

## 2018-07-31 DIAGNOSIS — I10 Essential (primary) hypertension: Secondary | ICD-10-CM

## 2018-08-02 ENCOUNTER — Other Ambulatory Visit: Payer: Self-pay | Admitting: Family

## 2018-08-02 NOTE — Telephone Encounter (Signed)
NA 5/11-jhb

## 2018-08-02 NOTE — Telephone Encounter (Signed)
Last labs 07/15/2017. Needs appointment

## 2018-08-02 NOTE — Telephone Encounter (Signed)
Last ov and labs with [facility] 07/15/2017. Needs to be seen

## 2018-08-09 ENCOUNTER — Ambulatory Visit (INDEPENDENT_AMBULATORY_CARE_PROVIDER_SITE_OTHER): Payer: Medicare Other | Admitting: Family

## 2018-08-09 ENCOUNTER — Other Ambulatory Visit: Payer: Self-pay

## 2018-08-09 ENCOUNTER — Encounter: Payer: Self-pay | Admitting: Family

## 2018-08-09 ENCOUNTER — Other Ambulatory Visit: Payer: Self-pay | Admitting: Family

## 2018-08-09 DIAGNOSIS — I10 Essential (primary) hypertension: Secondary | ICD-10-CM

## 2018-08-09 DIAGNOSIS — E785 Hyperlipidemia, unspecified: Secondary | ICD-10-CM | POA: Diagnosis not present

## 2018-08-09 DIAGNOSIS — F419 Anxiety disorder, unspecified: Secondary | ICD-10-CM | POA: Diagnosis not present

## 2018-08-09 DIAGNOSIS — E8881 Metabolic syndrome: Secondary | ICD-10-CM

## 2018-08-09 DIAGNOSIS — G8929 Other chronic pain: Secondary | ICD-10-CM

## 2018-08-09 DIAGNOSIS — M549 Dorsalgia, unspecified: Secondary | ICD-10-CM

## 2018-08-09 MED ORDER — HYDROCHLOROTHIAZIDE 12.5 MG PO CAPS: 12.5000 mg | ORAL_CAPSULE | Freq: Every day | ORAL | 0 refills | Status: DC

## 2018-08-09 MED ORDER — CITALOPRAM HYDROBROMIDE 20 MG PO TABS
20.0000 mg | ORAL_TABLET | Freq: Every day | ORAL | 0 refills | Status: DC
Start: 2018-08-09 — End: 2018-11-01

## 2018-08-09 MED ORDER — ATORVASTATIN CALCIUM 20 MG PO TABS: 20.0000 mg | ORAL_TABLET | Freq: Every day | ORAL | 0 refills | Status: DC

## 2018-08-09 NOTE — Progress Notes (Signed)
Virtual Visit via telephone Note  I connected with Marcus Carr on 08/09/18 at 11:34 AM by telephone and verified that I am speaking with the correct person using two identifiers. Marcus Carr is currently located at home and wife is currently with her during visit. The provider, [name], FNP is located in their office at time of visit.  I discussed the limitations, risks, security and privacy concerns of performing an evaluation and management service by telephone and the availability of in person appointments. I also discussed with the patient that there may be a patient responsible charge related to this service. The patient expressed understanding and agreed to proceed.   History and Present Illness:  Pt presents to the office today for chronic follow up. PT has history of osteomyelitis of mandible, doing stable at this time. Pt is followed by Pain Management every 3 months for chronic back pain. Hypertension  This is a chronic problem. The problem has been resolved since onset. The problem is controlled. Associated symptoms include anxiety. Pertinent negatives include no malaise/fatigue, peripheral edema or shortness of breath. Risk factors for coronary artery disease include dyslipidemia, obesity, male gender and sedentary lifestyle. The current treatment provides moderate improvement. There is no history of kidney disease or CAD/MI.  Hyperlipidemia  This is a chronic problem. The current episode started more than 1 year ago. The problem is controlled. Recent lipid tests were reviewed and are normal. Exacerbating diseases include obesity. Pertinent negatives include no shortness of breath. Current antihyperlipidemic treatment includes statins. The current treatment provides moderate improvement of lipids. Risk factors for coronary artery disease include dyslipidemia, male sex, hypertension and a sedentary lifestyle.  Anxiety  Presents for follow-up visit. Symptoms include  decreased concentration, depressed mood, excessive worry, nervous/anxious behavior and panic. Patient reports no shortness of breath. Symptoms occur occasionally. The severity of symptoms is moderate.    Metabolic Syndrome Takes Lipitor daily. Does not do any scheduled exercise and states he "eats too many snacks".     Review of Systems  Constitutional: Negative for malaise/fatigue.  Respiratory: Negative for shortness of breath.   Psychiatric/Behavioral: Positive for decreased concentration. The patient is nervous/anxious.   All other systems reviewed and are negative.    Observations/Objective: No SOB or distress   Assessment and Plan: 1. Anxiety - citalopram (CELEXA) 20 MG tablet; Take 1 tablet (20 mg total) by mouth daily. (Needs to be seen before next refill)  Dispense: 90 tablet; Refill: 0  2. Chronic bilateral back pain, unspecified back location  3. Hyperlipidemia, unspecified hyperlipidemia type  - atorvastatin (LIPITOR) 20 MG tablet; Take 1 tablet (20 mg total) by mouth daily. (Needs to be seen before next refill)  Dispense: 90 tablet; Refill: 0  4. Morbid obesity ([place])  5. Metabolic syndrome - atorvastatin (LIPITOR) 20 MG tablet; Take 1 tablet (20 mg total) by mouth daily. (Needs to be seen before next refill)  Dispense: 90 tablet; Refill: 0  6. Essential hypertension - hydrochlorothiazide (MICROZIDE) 12.5 MG capsule; Take 1 capsule (12.5 mg total) by mouth daily. (Needs to be seen before next refill)  Dispense: 90 capsule; Refill: 0  Pt will come in the office in the next 4 weeks for lab work  Continue medications  [place] and exercise encouraged Lab work reviewed   I discussed the assessment and treatment plan with the patient. The patient was provided an opportunity to ask questions and all were answered. The patient agreed with the plan and demonstrated an understanding  of the instructions.   The patient was advised to call back or seek an in-person  evaluation if the symptoms worsen or if the condition fails to improve as anticipated.  The above assessment and management plan was discussed with the patient. The patient verbalized understanding of and has agreed to the management plan. Patient is aware to call the clinic if symptoms persist or worsen. Patient is aware when to return to the clinic for a follow-up visit. Patient educated on when it is appropriate to go to the emergency department.   Time call ended:  11:46 AM  I provided  12 minutes of non-face-to-face time during this encounter.    [name], FNP

## 2018-09-06 DIAGNOSIS — Z79891 Long term (current) use of opiate analgesic: Secondary | ICD-10-CM | POA: Diagnosis not present

## 2018-09-06 DIAGNOSIS — Z5181 Encounter for therapeutic drug level monitoring: Secondary | ICD-10-CM | POA: Diagnosis not present

## 2018-09-06 DIAGNOSIS — Z79899 Other long term (current) drug therapy: Secondary | ICD-10-CM | POA: Diagnosis not present

## 2018-09-08 ENCOUNTER — Other Ambulatory Visit: Payer: Self-pay

## 2018-09-09 ENCOUNTER — Encounter: Payer: Self-pay | Admitting: Family

## 2018-09-09 ENCOUNTER — Ambulatory Visit (INDEPENDENT_AMBULATORY_CARE_PROVIDER_SITE_OTHER): Payer: Medicare Other | Admitting: Family

## 2018-09-09 VITALS — BP 138/79 | HR 53 | Temp 97.6°F | Ht 67.0 in | Wt 276.0 lb

## 2018-09-09 DIAGNOSIS — G8929 Other chronic pain: Secondary | ICD-10-CM

## 2018-09-09 DIAGNOSIS — I10 Essential (primary) hypertension: Secondary | ICD-10-CM

## 2018-09-09 DIAGNOSIS — N183 Chronic kidney disease, stage 3 unspecified: Secondary | ICD-10-CM

## 2018-09-09 DIAGNOSIS — M549 Dorsalgia, unspecified: Secondary | ICD-10-CM

## 2018-09-09 DIAGNOSIS — E785 Hyperlipidemia, unspecified: Secondary | ICD-10-CM

## 2018-09-09 DIAGNOSIS — E8881 Metabolic syndrome: Secondary | ICD-10-CM | POA: Diagnosis not present

## 2018-09-09 DIAGNOSIS — F419 Anxiety disorder, unspecified: Secondary | ICD-10-CM

## 2018-09-09 LAB — LIPID PANEL

## 2018-09-09 NOTE — Progress Notes (Signed)
Subjective:    Patient ID: Marcus Carr, male    DOB: [birth year], 78 y.o.   MRN: [id number]  Chief Complaint  Patient presents with  . Medical Management of Chronic Issues   Pt presents to the office today for chronic follow up. PT has history of osteomyelitis of mandible, doing stable at this time. Pt is followed by Pain Management every 3 months for chronic back pain. Hypertension This is a chronic problem. The current episode started more than 1 year ago. The problem has been waxing and waning since onset. The problem is uncontrolled. Associated symptoms include anxiety. Pertinent negatives include no headaches, malaise/fatigue, peripheral edema or shortness of breath. Risk factors for coronary artery disease include dyslipidemia, obesity, male gender and sedentary lifestyle. The current treatment provides moderate improvement.  Anxiety Presents for follow-up visit. Symptoms include decreased concentration, excessive worry, insomnia, irritability, nervous/anxious behavior, panic and restlessness. Patient reports no shortness of breath. Symptoms occur most days. The severity of symptoms is moderate.    Back Pain This is a chronic problem. The current episode started more than 1 year ago. The problem occurs intermittently. The problem has been waxing and waning since onset. The pain is present in the lumbar spine. The quality of the pain is described as aching. The pain is at a severity of 3/10. The pain is moderate. Pertinent negatives include no bladder incontinence, bowel incontinence, headaches, perianal numbness or tingling. The treatment provided moderate relief.  Hyperlipidemia This is a chronic problem. The current episode started more than 1 year ago. The problem is controlled. Recent lipid tests were reviewed and are normal. Exacerbating diseases include obesity. Pertinent negatives include no shortness of breath. Current antihyperlipidemic treatment includes statins. The  current treatment provides moderate improvement of lipids. Risk factors for coronary artery disease include dyslipidemia, male sex, hypertension and a sedentary lifestyle.  Metabolic Syndrome PT does not do any scheduled exercise, does states he works in his garden. He states he eats what he wants, and a lot of time eats "too much junk food".     Review of Systems  Constitutional: Positive for irritability. Negative for malaise/fatigue.  Respiratory: Negative for shortness of breath.   Gastrointestinal: Negative for bowel incontinence.  Genitourinary: Negative for bladder incontinence.  Musculoskeletal: Positive for back pain.  Neurological: Negative for tingling and headaches.  Psychiatric/Behavioral: Positive for decreased concentration. The patient is nervous/anxious and has insomnia.   All other systems reviewed and are negative.      Objective:   Physical Exam Vitals signs reviewed.  Constitutional:      General: He is not in acute distress.    Appearance: Normal appearance. He is well-developed. He is obese.  HENT:     Head: Normocephalic.     Right Ear: Tympanic membrane normal.     Left Ear: Tympanic membrane normal.  Eyes:     General:        Right eye: No discharge.        Left eye: No discharge.     Pupils: Pupils are equal, round, and reactive to light.  Neck:     Musculoskeletal: Normal range of motion and neck supple.     Thyroid: No thyromegaly.  Cardiovascular:     Rate and Rhythm: Normal rate and regular rhythm.     Heart sounds: Normal heart sounds. No murmur.  Pulmonary:     Effort: Pulmonary effort is normal. No respiratory distress.     Breath sounds: Normal breath sounds. No  wheezing.  Abdominal:     General: Bowel sounds are normal. There is no distension.     Palpations: Abdomen is soft.     Tenderness: There is no abdominal tenderness.  Musculoskeletal: Normal range of motion.        General: No tenderness.  Skin:    General: Skin is warm and  dry.     Findings: No erythema or rash.  Neurological:     Mental Status: He is alert and oriented to person, place, and time.     Cranial Nerves: No cranial nerve deficit.     Deep Tendon Reflexes: Reflexes are normal and symmetric.  Psychiatric:        Behavior: Behavior normal.        Thought Content: Thought content normal.        Judgment: Judgment normal.       BP (!) 152/83   Pulse (!) 53   Temp 97.6 F (36.4 C) (Oral)   Ht '5\' 7"'  (1.702 m)   Wt 276 lb (125.2 kg)   BMI 43.23 kg/m      Assessment & Plan:  Zarek Relph comes in today with chief complaint of Medical Management of Chronic Issues   Diagnosis and orders addressed:  1. Essential hypertension - CMP14+EGFR - CBC with Differential/Platelet  2. Stage 3 chronic kidney disease (HCC) - CMP14+EGFR - CBC with Differential/Platelet  3. Morbid obesity ([place]) - CMP14+EGFR - CBC with Differential/Platelet  4. Metabolic syndrome - OHC09+ZZCK - CBC with Differential/Platelet - Lipid panel  5. Hyperlipidemia, unspecified hyperlipidemia type - CMP14+EGFR - CBC with Differential/Platelet - Lipid panel  6. Chronic bilateral back pain, unspecified back location  - CMP14+EGFR - CBC with Differential/Platelet  7. Anxiety - CMP14+EGFR - CBC with Differential/Platelet   Labs pending Health Maintenance reviewed Diet and exercise encouraged  Follow up plan: 6 months   [name], FNP

## 2018-09-09 NOTE — Patient Instructions (Signed)
Health Maintenance After Age 77 After age 77, you are at a higher risk for certain long-term diseases and infections as well as injuries from falls. Falls are a major cause of broken bones and head injuries in people who are older than age 77. Getting regular preventive care can help to keep you healthy and well. Preventive care includes getting regular testing and making lifestyle changes as recommended by your health care provider. Talk with your health care provider about:  Which screenings and tests you should have. A screening is a test that checks for a disease when you have no symptoms.  A diet and exercise plan that is right for you. What should I know about screenings and tests to prevent falls? Screening and testing are the best ways to find a health problem early. Early diagnosis and treatment give you the best chance of managing medical conditions that are common after age 77. Certain conditions and lifestyle choices may make you more likely to have a fall. Your health care provider may recommend:  Regular vision checks. Poor vision and conditions such as cataracts can make you more likely to have a fall. If you wear glasses, make sure to get your prescription updated if your vision changes.  Medicine review. Work with your health care provider to regularly review all of the medicines you are taking, including over-the-counter medicines. Ask your health care provider about any side effects that may make you more likely to have a fall. Tell your health care provider if any medicines that you take make you feel dizzy or sleepy.  Osteoporosis screening. Osteoporosis is a condition that causes the bones to get weaker. This can make the bones weak and cause them to break more easily.  Blood pressure screening. Blood pressure changes and medicines to control blood pressure can make you feel dizzy.  Strength and balance checks. Your health care provider may recommend certain tests to check your  strength and balance while standing, walking, or changing positions.  Foot health exam. Foot pain and numbness, as well as not wearing proper footwear, can make you more likely to have a fall.  Depression screening. You may be more likely to have a fall if you have a fear of falling, feel emotionally low, or feel unable to do activities that you used to do.  Alcohol use screening. Using too much alcohol can affect your balance and may make you more likely to have a fall. What actions can I take to lower my risk of falls? General instructions  Talk with your health care provider about your risks for falling. Tell your health care provider if: ? You fall. Be sure to tell your health care provider about all falls, even ones that seem minor. ? You feel dizzy, sleepy, or off-balance.  Take over-the-counter and prescription medicines only as told by your health care provider. These include any supplements.  Eat a healthy diet and maintain a healthy weight. A healthy diet includes low-fat dairy products, low-fat (lean) meats, and fiber from whole grains, beans, and lots of fruits and vegetables. Home safety  Remove any tripping hazards, such as rugs, cords, and clutter.  Install safety equipment such as grab bars in bathrooms and safety rails on stairs.  Keep rooms and walkways well-lit. Activity   Follow a regular exercise program to stay fit. This will help you maintain your balance. Ask your health care provider what types of exercise are appropriate for you.  If you need a cane or   walker, use it as recommended by your health care provider.  Wear supportive shoes that have nonskid soles. Lifestyle  Do not drink alcohol if your health care provider tells you not to drink.  If you drink alcohol, limit how much you have: ? 0-1 drink a day for women. ? 0-2 drinks a day for men.  Be aware of how much alcohol is in your drink. In the [place] one drink equals one typical bottle of beer (12  oz), one-half glass of wine (5 oz), or one shot of hard liquor (1 oz).  Do not use any products that contain nicotine or tobacco, such as cigarettes and e-cigarettes. If you need help quitting, ask your health care provider. Summary  Having a healthy lifestyle and getting preventive care can help to protect your health and wellness after age 77.  Screening and testing are the best way to find a health problem early and help you avoid having a fall. Early diagnosis and treatment give you the best chance for managing medical conditions that are more common for people who are older than age 77.  Falls are a major cause of broken bones and head injuries in people who are older than age 77. Take precautions to prevent a fall at home.  Work with your health care provider to learn what changes you can make to improve your health and wellness and to prevent falls. This information is not intended to replace advice given to you by your health care provider. Make sure you discuss any questions you have with your health care provider. Document Released: 01/21/2017 Document Revised: 01/21/2017 Document Reviewed: 01/21/2017 Elsevier Interactive Patient Education  2019 [employer]  

## 2018-09-10 LAB — CBC WITH DIFFERENTIAL/PLATELET
Basophils Absolute: 0 10*3/uL (ref 0.0–0.2)
Basos: 1 %
EOS (ABSOLUTE): 0.1 10*3/uL (ref 0.0–0.4)
Eos: 3 %
Hematocrit: 40 % (ref 37.5–51.0)
Hemoglobin: 13.7 g/dL (ref 13.0–17.7)
Immature Grans (Abs): 0 10*3/uL (ref 0.0–0.1)
Immature Granulocytes: 0 %
Lymphocytes Absolute: 1.1 10*3/uL (ref 0.7–3.1)
Lymphs: 24 %
MCH: 29.3 pg (ref 26.6–33.0)
MCHC: 34.3 g/dL (ref 31.5–35.7)
MCV: 86 fL (ref 79–97)
Monocytes Absolute: 0.5 10*3/uL (ref 0.1–0.9)
Monocytes: 11 %
Neutrophils Absolute: 2.7 10*3/uL (ref 1.4–7.0)
Neutrophils: 61 %
Platelets: 129 10*3/uL — ABNORMAL LOW (ref 150–450)
RBC: 4.68 x10E6/uL (ref 4.14–5.80)
RDW: 13.2 % (ref 11.6–15.4)
WBC: 4.4 10*3/uL (ref 3.4–10.8)

## 2018-09-10 LAB — CMP14+EGFR
ALT: 17 IU/L (ref 0–44)
AST: 22 IU/L (ref 0–40)
Albumin/Globulin Ratio: 1.6 (ref 1.2–2.2)
Albumin: 4.3 g/dL (ref 3.7–4.7)
Alkaline Phosphatase: 80 IU/L (ref 39–117)
BUN/Creatinine Ratio: 17 (ref 10–24)
BUN: 25 mg/dL (ref 8–27)
Bilirubin Total: 0.5 mg/dL (ref 0.0–1.2)
CO2: 26 mmol/L (ref 20–29)
Calcium: 9.8 mg/dL (ref 8.6–10.2)
Chloride: 98 mmol/L (ref 96–106)
Creatinine, Ser: 1.46 mg/dL — ABNORMAL HIGH (ref 0.76–1.27)
GFR calc Af Amer: 53 mL/min/{1.73_m2} — ABNORMAL LOW (ref >59)
GFR calc non Af Amer: 46 mL/min/{1.73_m2} — ABNORMAL LOW (ref >59)
Globulin, Total: 2.7 g/dL (ref 1.5–4.5)
Glucose: 91 mg/dL (ref 65–99)
Potassium: 4.4 mmol/L (ref 3.5–5.2)
Sodium: 138 mmol/L (ref 134–144)
Total Protein: 7 g/dL (ref 6.0–8.5)

## 2018-09-10 LAB — LIPID PANEL
Chol/HDL Ratio: 4.1 ratio (ref 0.0–5.0)
Cholesterol, Total: 123 mg/dL (ref 100–199)
HDL: 30 mg/dL — ABNORMAL LOW (ref >39)
LDL Calculated: 62 mg/dL (ref 0–99)
Triglycerides: 156 mg/dL — ABNORMAL HIGH (ref 0–149)
VLDL Cholesterol Cal: 31 mg/dL (ref 5–40)

## 2018-09-30 ENCOUNTER — Other Ambulatory Visit: Payer: Self-pay | Admitting: Family

## 2018-09-30 DIAGNOSIS — I10 Essential (primary) hypertension: Secondary | ICD-10-CM

## 2018-10-18 ENCOUNTER — Telehealth: Payer: Self-pay | Admitting: Family

## 2018-10-18 NOTE — Telephone Encounter (Signed)
Aware, we do not test here and given information on other sites.

## 2018-10-21 MED ORDER — ALBUTEROL SULFATE HFA 108 (90 BASE) MCG/ACT IN AERS: 2.0000 | INHALATION_SPRAY | Freq: Four times a day (QID) | RESPIRATORY_TRACT | 1 refills | Status: DC | PRN

## 2018-10-21 MED ORDER — BENZONATATE 200 MG PO CAPS: 200.0000 mg | ORAL_CAPSULE | Freq: Three times a day (TID) | ORAL | 1 refills | Status: DC | PRN

## 2018-10-21 NOTE — Telephone Encounter (Signed)
Daughter states that patient is still having nasal congestion, sinus pressure, productive cough, no fevers.  Wife, grandson, and daughter all tested positive for COVID.  Patient was tested at [facility] on Tuesday and was also positive. Daughter would like to know if patient can be given anything

## 2018-10-21 NOTE — Addendum Note (Signed)
Addended by: [name] A on: 10/21/2018 02:14 PM   Modules accepted: Orders

## 2018-10-21 NOTE — Telephone Encounter (Signed)
Wife tested + for COVID. His test is not back yet daughter wants to speak with someone about what he can call in.

## 2018-10-21 NOTE — Telephone Encounter (Signed)
Daughter aware.

## 2018-10-21 NOTE — Telephone Encounter (Signed)
Albuterol and tessalon Prescription sent to pharmacy. Let me know if symptoms worsen or do not improve. If they worsen go to ED.

## 2018-10-25 ENCOUNTER — Telehealth: Payer: Self-pay | Admitting: Family Medicine

## 2018-10-25 NOTE — Telephone Encounter (Signed)
Pts daughter called about her father. States he tested positive for COVID-19 on 10/12/2018. Daughter states pt is still running a fever. States he is congested, fatigued, and has a decreased appetite. States he has slept most of the day and she is concerned. Due to continued and worsening symptoms, it was suggested the daughter take the pt to the ED for evaluation and treatment. Daughter states she will try to get him to go and she will take him to [facility] or [facility].

## 2018-10-26 ENCOUNTER — Other Ambulatory Visit: Payer: Self-pay

## 2018-10-26 ENCOUNTER — Encounter: Payer: Self-pay | Admitting: Family

## 2018-10-26 ENCOUNTER — Ambulatory Visit (INDEPENDENT_AMBULATORY_CARE_PROVIDER_SITE_OTHER): Payer: Medicare Other | Admitting: Family

## 2018-10-26 DIAGNOSIS — U071 COVID-19: Secondary | ICD-10-CM | POA: Diagnosis not present

## 2018-10-26 DIAGNOSIS — J189 Pneumonia, unspecified organism: Secondary | ICD-10-CM

## 2018-10-26 MED ORDER — DOXYCYCLINE HYCLATE 100 MG PO TABS: 100.0000 mg | ORAL_TABLET | Freq: Two times a day (BID) | ORAL | 0 refills | Status: DC

## 2018-10-26 NOTE — Progress Notes (Signed)
   Virtual Visit via telephone Note Due to COVID-19 pandemic this visit was conducted virtually. This visit type was conducted due to national recommendations for restrictions regarding the COVID-19 Pandemic (e.g. social distancing, sheltering in place) in an effort to limit this patient's exposure and mitigate transmission in our community. All issues noted in this document were discussed and addressed.  A physical exam was not performed with this format.  I connected with Marcus Carr on 10/26/18 at 4:30 pm by telephone and verified that I am speaking with the correct person using two identifiers. Marcus Carr is currently located at home  and wife is currently with her during visit. The provider, [name], FNP is located in their office at time of visit.  I discussed the limitations, risks, security and privacy concerns of performing an evaluation and management service by telephone and the availability of in person appointments. I also discussed with the patient that there may be a patient responsible charge related to this service. The patient expressed understanding and agreed to proceed.   History and Present Illness:  PT calls today with feeling of fatigue, cough, and decreased appetite. He was diagnosed with COVD 10/19/18. He states he is feeling slightly better, but still tired and does not want to eat.  Cough The current episode started 1 to 4 weeks ago. The problem has been waxing and waning. The problem occurs every few minutes. The cough is productive of sputum. Associated symptoms include chills, a fever (low grade), myalgias and nasal congestion. Pertinent negatives include no ear congestion, ear pain, headaches, shortness of breath or wheezing. The symptoms are aggravated by lying down. He has tried rest (albuterol) for the symptoms. The treatment provided mild relief.      Review of Systems  Constitutional: Positive for chills and fever (low grade).  HENT:  Negative for ear pain.   Respiratory: Positive for cough. Negative for shortness of breath and wheezing.   Musculoskeletal: Positive for myalgias.  Neurological: Negative for headaches.  All other systems reviewed and are negative.    Observations/Objective: No SOB or distress noted, sounds stable  Assessment and Plan: 1. COVID-19 virus detected Continue Self-isolate  Rest Force fluids   2. Community acquired pneumonia, unspecified laterality Rest Force fluids Tylenol as needed - doxycycline (VIBRA-TABS) 100 MG tablet; Take 1 tablet (100 mg total) by mouth 2 (two) times daily.  Dispense: 20 tablet; Refill: 0  Follow up in 1 week to recheck If SOB worsens or does not improve go to ED!!!     I discussed the assessment and treatment plan with the patient. The patient was provided an opportunity to ask questions and all were answered. The patient agreed with the plan and demonstrated an understanding of the instructions.   The patient was advised to call back or seek an in-person evaluation if the symptoms worsen or if the condition fails to improve as anticipated.  The above assessment and management plan was discussed with the patient. The patient verbalized understanding of and has agreed to the management plan. Patient is aware to call the clinic if symptoms persist or worsen. Patient is aware when to return to the clinic for a follow-up visit. Patient educated on when it is appropriate to go to the emergency department.   Time call ended:  4:39 pm  I provided 9 minutes of non-face-to-face time during this encounter.    [name], FNP

## 2018-10-29 ENCOUNTER — Ambulatory Visit (INDEPENDENT_AMBULATORY_CARE_PROVIDER_SITE_OTHER): Payer: Medicare Other | Admitting: Family Medicine

## 2018-10-29 ENCOUNTER — Other Ambulatory Visit: Payer: Self-pay

## 2018-10-29 DIAGNOSIS — R630 Anorexia: Secondary | ICD-10-CM

## 2018-10-29 DIAGNOSIS — R110 Nausea: Secondary | ICD-10-CM | POA: Diagnosis not present

## 2018-10-29 MED ORDER — ONDANSETRON 4 MG PO TBDP: 4.0000 mg | ORAL_TABLET | Freq: Three times a day (TID) | ORAL | 0 refills | Status: DC | PRN

## 2018-10-29 NOTE — Progress Notes (Signed)
Telephone visit  Subjective: CC: decreased appetite. PCP: Sharion Balloon, FNP FOY:DXAJ Marcus Carr is a 78 y.o. male calls for telephone consult today. Patient provides verbal consent for consult held via phone.  Location of patient: home Location of provider: [facility] Others present for call: wife  1. Decreased PO intake Patient reports a 4-day history of decreased appetite.  Patient was recently diagnosed with COVID-19.  He has been doing okay from a respiratory standpoint.  She reports minimal cough.  He is currently being treated with doxycycline p.o. twice daily.  They also have Tessalon Perles if needed.  Denies any fevers.  T-max has been 49 F and that is since resolved.  No vomiting.  She does not report any diarrhea.  He is tolerating fluids without difficulty.  Urine output to her knowledge is normal.  He rouses easily to stimulus.  She does report that he has been malaised otherwise.   ROS: Per HPI  Allergies  Allergen Reactions  . Penicillins Rash   Past Medical History:  Diagnosis Date  . Abscess, jaw   . Anxiety   . Back pain    chronic back pain treated by Dr. Nelva Bush  . Chronic kidney disease   . Chronic mastoiditis of both sides 11/07/2014  . Cigarette smoker 11/07/2014  . Gout   . Hyperlipidemia   . Hypertension   . Morbid obesity ([place]) 11/07/2014  . Osteomyelitis of mandible 11/07/2014  . Poor dentition 11/07/2014  . Thyroid nodule 11/07/2014    Current Outpatient Medications:  .  albuterol (VENTOLIN HFA) 108 (90 Base) MCG/ACT inhaler, Inhale 2 puffs into the lungs every 6 (six) hours as needed for wheezing or shortness of breath., Disp: 8 g, Rfl: 1 .  aspirin 325 MG tablet, Take 325 mg by mouth daily., Disp: , Rfl:  .  atorvastatin (LIPITOR) 20 MG tablet, Take 1 tablet (20 mg total) by mouth daily. (Needs to be seen before next refill), Disp: 90 tablet, Rfl: 0 .  benzonatate (TESSALON) 200 MG capsule, Take 1 capsule (200 mg total) by mouth 3 (three) times daily  as needed., Disp: 30 capsule, Rfl: 1 .  cholecalciferol (VITAMIN D) 400 UNITS TABS tablet, Take 800 Units by mouth daily., Disp: , Rfl:  .  citalopram (CELEXA) 20 MG tablet, Take 1 tablet (20 mg total) by mouth daily. (Needs to be seen before next refill), Disp: 90 tablet, Rfl: 0 .  diclofenac sodium (VOLTAREN) 1 % GEL, Apply 4 g topically 4 (four) times daily. To left knee for pain, Disp: 200 g, Rfl: 1 .  doxycycline (VIBRA-TABS) 100 MG tablet, Take 1 tablet (100 mg total) by mouth 2 (two) times daily., Disp: 20 tablet, Rfl: 0 .  fish oil-omega-3 fatty acids 1000 MG capsule, Take 1 g by mouth 2 (two) times daily. , Disp: , Rfl:  .  hydrochlorothiazide (MICROZIDE) 12.5 MG capsule, Take 1 capsule (12.5 mg total) by mouth daily. (Needs to be seen before next refill), Disp: 90 capsule, Rfl: 0 .  NAPROXEN PO, Take 500 mg by mouth 2 (two) times a day., Disp: , Rfl:  .  oxyCODONE-acetaminophen (PERCOCET) 10-325 MG per tablet, Take 1 tablet by mouth every 12 (twelve) hours as needed., Disp: 90 tablet, Rfl: 0 .  Specialty Vitamins Products (MAGNESIUM, AMINO ACID CHELATE,) 133 MG tablet, Take 1 tablet by mouth daily., Disp: , Rfl:  .  verapamil (CALAN-SR) 180 MG CR tablet, TAKE 1 TABLET (180 MG TOTAL) BY MOUTH AT BEDTIME., Disp: 90 tablet,  Rfl: 1  Assessment/ Plan: 78 y.o. male   1. Decreased appetite Patient with active COVID-19 infection.  I suspect that decreased appetite and nausea may be related to underlying illness versus use of doxycycline for presumed pneumonia.  No red flag signs or symptoms.  He is tolerating fluids.  I have prescribed Zofran ODT to use every 8 hours as needed.  We discussed red flag signs and symptoms and reasons for emergent evaluation emergency department.  His wife voiced good understanding and they will follow-up as directed - ondansetron (ZOFRAN ODT) 4 MG disintegrating tablet; Take 1 tablet (4 mg total) by mouth every 8 (eight) hours as needed for nausea or vomiting  ((dissolve in mouth)).  Dispense: 20 tablet; Refill: 0  2. Nausea - ondansetron (ZOFRAN ODT) 4 MG disintegrating tablet; Take 1 tablet (4 mg total) by mouth every 8 (eight) hours as needed for nausea or vomiting ((dissolve in mouth)).  Dispense: 20 tablet; Refill: 0   Start time: 2:57pm End time: 3:02pm  Total time spent on patient care (including telephone call/ virtual visit): 12 minutes  [place], [place] [phone]

## 2018-10-29 NOTE — Patient Instructions (Signed)
Nausea, Adult Nausea is feeling sick to your stomach or feeling that you are about to throw up (vomit). Feeling sick to your stomach is usually not serious, but it may be an early sign of a more serious medical problem. As you feel sicker to your stomach, you may throw up. If you throw up, or if you are not able to drink enough fluids, there is a risk that you may lose too much water in your body (get dehydrated). If you lose too much water in your body, you may:  Feel tired.  Feel thirsty.  Have a dry mouth.  Have cracked lips.  Go pee (urinate) less often. Older adults and people who have other diseases or a weak body defense system (immune system) have a higher risk of losing too much water in the body. The main goals of treating this condition are:  To relieve your nausea.  To ensure your nausea occurs less often.  To prevent throwing up and losing too much fluid. Follow these instructions at home: Watch your symptoms for any changes. Tell your doctor about them. Follow these instructions as told by your doctor. Eating and drinking      Take an ORS (oral rehydration solution). This is a drink that is sold at pharmacies and stores.  Drink clear fluids in small amounts as you are able. These include: ? Water. ? Ice chips. ? Fruit juice that has water added (diluted fruit juice). ? Low-calorie sports drinks.  Eat bland, easy-to-digest foods in small amounts as you are able, such as: ? Bananas. ? Applesauce. ? Rice. ? Low-fat (lean) meats. ? Toast. ? Crackers.  Avoid drinking fluids that have a lot of sugar or caffeine in them. This includes energy drinks, sports drinks, and soda.  Avoid alcohol.  Avoid spicy or fatty foods. General instructions  Take over-the-counter and prescription medicines only as told by your doctor.  Rest at home while you get better.  Drink enough fluid to keep your pee (urine) pale yellow.  Take slow and deep breaths when you feel  sick to your stomach.  Avoid food or things that have strong smells.  Wash your hands often with soap and water. If you cannot use soap and water, use hand sanitizer.  Make sure that all people in your home wash their hands well and often.  Keep all follow-up visits as told by your doctor. This is important. Contact a doctor if:  You feel sicker to your stomach.  You feel sick to your stomach for more than 2 days.  You throw up.  You are not able to drink fluids without throwing up.  You have new symptoms.  You have a fever.  You have a headache.  You have muscle cramps.  You have a rash.  You have pain while peeing.  You feel light-headed or dizzy. Get help right away if:  You have pain in your chest, neck, arm, or jaw.  You feel very weak or you pass out (faint).  You have throw up that is bright red or looks like coffee grounds.  You have bloody or black poop (stools) or poop that looks like tar.  You have a very bad headache, a stiff neck, or both.  You have very bad pain, cramping, or bloating in your belly (abdomen).  You have trouble breathing or you are breathing very quickly.  Your heart is beating very quickly.  Your skin feels cold and clammy.  You feel confused.    You have signs of losing too much water in your body, such as: ? Dark pee, very little pee, or no pee. ? Cracked lips. ? Dry mouth. ? Sunken eyes. ? Sleepiness. ? Weakness. These symptoms may be an emergency. Do not wait to see if the symptoms will go away. Get medical help right away. Call your local emergency services (911 in the [place] Do not drive yourself to the hospital. Summary  Nausea is feeling sick to your stomach or feeling that you are about to throw up (vomit).  If you throw up, or if you are not able to drink enough fluids, there is a risk that you may lose too much water in your body (get dehydrated).  Eat and drink what your doctor tells you. Take  over-the-counter and prescription medicines only as told by your doctor.  Contact a doctor right away if your symptoms get worse or you have new symptoms.  Keep all follow-up visits as told by your doctor. This is important. This information is not intended to replace advice given to you by your health care provider. Make sure you discuss any questions you have with your health care provider. Document Released: 02/27/2011 Document Revised: 08/18/2017 Document Reviewed: 08/18/2017 Elsevier Patient Education  2020 [employer].

## 2018-10-31 ENCOUNTER — Other Ambulatory Visit: Payer: Self-pay | Admitting: Family

## 2018-10-31 DIAGNOSIS — I10 Essential (primary) hypertension: Secondary | ICD-10-CM

## 2018-10-31 DIAGNOSIS — F419 Anxiety disorder, unspecified: Secondary | ICD-10-CM

## 2018-11-01 NOTE — Telephone Encounter (Signed)
OV 11/04/18

## 2018-11-02 ENCOUNTER — Telehealth: Payer: Self-pay | Admitting: Family

## 2018-11-02 NOTE — Telephone Encounter (Signed)
Spoke with pt's daughter regarding symptoms Urged daughter to take pt to ER

## 2018-11-04 ENCOUNTER — Encounter: Payer: Self-pay | Admitting: Family

## 2018-11-04 ENCOUNTER — Ambulatory Visit (INDEPENDENT_AMBULATORY_CARE_PROVIDER_SITE_OTHER): Payer: Medicare Other | Admitting: Family

## 2018-11-04 DIAGNOSIS — U071 COVID-19: Secondary | ICD-10-CM | POA: Diagnosis not present

## 2018-11-04 DIAGNOSIS — R531 Weakness: Secondary | ICD-10-CM

## 2018-11-04 NOTE — Progress Notes (Signed)
   Virtual Visit via telephone Note Due to COVID-19 pandemic this visit was conducted virtually. This visit type was conducted due to national recommendations for restrictions regarding the COVID-19 Pandemic (e.g. social distancing, sheltering in place) in an effort to limit this patient's exposure and mitigate transmission in our community. All issues noted in this document were discussed and addressed.  A physical exam was not performed with this format.  I connected with Marcus Carr on 11/04/18 at 10:15 AM by telephone and verified that I am speaking with the correct person using two identifiers. Marcus Carr is currently located at home and wife is currently with her during visit. The provider, [name], FNP is located in their office at time of visit.  I discussed the limitations, risks, security and privacy concerns of performing an evaluation and management service by telephone and the availability of in person appointments. I also discussed with the patient that there may be a patient responsible charge related to this service. The patient expressed understanding and agreed to proceed.   History and Present Illness:   HPI PT calls the office today for follow up on COVID. He was diagnosed with COVID on 10/19/18. He states his cough and fevers have resolved. He was having some decreased PO intake and was prescribed zofran. States he never started the zofran, because he started feeling better.   He was given Doxycyline and still has one more day left. He reports overall feeling much better, but still a little weak. States he is going to try to go out side and move around today.   He has an albuterol inhaler he could use, and used it this morning.    Review of Systems  Neurological: Positive for weakness.  All other systems reviewed and are negative.    Observations/Objective: No SOB or distress noted. Pt sounds great, no weakness noted.   Assessment and Plan: 1.  COVID-19 virus detected  2. Weakness  Complete doxycyline Rest Force fluids Tylenol as needed Red flags discussed Continue to self isolate Pt sounds much improved. He will call me if his symptoms change or worsen.     I discussed the assessment and treatment plan with the patient. The patient was provided an opportunity to ask questions and all were answered. The patient agreed with the plan and demonstrated an understanding of the instructions.   The patient was advised to call back or seek an in-person evaluation if the symptoms worsen or if the condition fails to improve as anticipated.  The above assessment and management plan was discussed with the patient. The patient verbalized understanding of and has agreed to the management plan. Patient is aware to call the clinic if symptoms persist or worsen. Patient is aware when to return to the clinic for a follow-up visit. Patient educated on when it is appropriate to go to the emergency department.   Time call ended:  10:27 AM   I provided 12 minutes of non-face-to-face time during this encounter.    [name], FNP

## 2018-11-08 ENCOUNTER — Ambulatory Visit (INDEPENDENT_AMBULATORY_CARE_PROVIDER_SITE_OTHER): Payer: Medicare Other | Admitting: *Deleted

## 2018-11-08 ENCOUNTER — Other Ambulatory Visit: Payer: Self-pay | Admitting: Family

## 2018-11-08 VITALS — Ht 67.0 in | Wt 276.0 lb

## 2018-11-08 DIAGNOSIS — Z0000 Encounter for general adult medical examination without abnormal findings: Secondary | ICD-10-CM | POA: Diagnosis not present

## 2018-11-08 DIAGNOSIS — E8881 Metabolic syndrome: Secondary | ICD-10-CM

## 2018-11-08 DIAGNOSIS — E785 Hyperlipidemia, unspecified: Secondary | ICD-10-CM

## 2018-11-08 NOTE — Patient Instructions (Signed)
Mr. Highley , Thank you for taking time to come for your Medicare Wellness Visit. I appreciate your ongoing commitment to your health goals. Please review the following plan we discussed and let me know if I can assist you in the future.   These are the goals we discussed: Goals     Exercise 3x per week (30 min per time)     Patient is a member at the [facility]- increase gym visits to 3 times per week for at 20-30 minutes on the stationary bike.       This is a list of the screening recommended for you and due dates:  Health Maintenance  Topic Date Due   Flu Shot  08/12/2019*   Tetanus Vaccine  07/09/2027   Pneumonia vaccines  Completed  *Topic was postponed. The date shown is not the original due date.     Advance Directive  Advance directives are legal documents that let you make choices ahead of time about your health care and medical treatment in case you become unable to communicate for yourself. Advance directives are a way for you to communicate your wishes to family, friends, and health care providers. This can help convey your decisions about end-of-life care if you become unable to communicate. Discussing and writing advance directives should happen over time rather than all at once. Advance directives can be changed depending on your situation and what you want, even after you have signed the advance directives. If you do not have an advance directive, some states assign family decision makers to act on your behalf based on how closely you are related to them. Each state has its own laws regarding advance directives. You may want to check with your health care provider, attorney, or state representative about the laws in your state. There are different types of advance directives, such as:  Medical power of attorney.  Living will.  Do not resuscitate (DNR) or do not attempt resuscitation (DNAR) order. Health care proxy and medical power of attorney A health care proxy,  also called a health care agent, is a person who is appointed to make medical decisions for you in cases in which you are unable to make the decisions yourself. Generally, people choose someone they know well and trust to represent their preferences. Make sure to ask this person for an agreement to act as your proxy. A proxy may have to exercise judgment in the event of a medical decision for which your wishes are not known. A medical power of attorney is a legal document that names your health care proxy. Depending on the laws in your state, after the document is written, it may also need to be:  Signed.  Notarized.  Dated.  Copied.  Witnessed.  Incorporated into your medical record. You may also want to appoint someone to manage your financial affairs in a situation in which you are unable to do so. This is called a durable power of attorney for finances. It is a separate legal document from the durable power of attorney for health care. You may choose the same person or someone different from your health care proxy to act as your agent in financial matters. If you do not appoint a proxy, or if there is a concern that the proxy is not acting in your best interests, a court-appointed guardian may be designated to act on your behalf. Living will A living will is a set of instructions documenting your wishes about medical care  when you cannot express them yourself. Health care providers should keep a copy of your living will in your medical record. You may want to give a copy to family members or friends. To alert caregivers in case of an emergency, you can place a card in your wallet to let them know that you have a living will and where they can find it. A living will is used if you become:  Terminally ill.  Incapacitated.  Unable to communicate or make decisions. Items to consider in your living will include:  The use or non-use of life-sustaining equipment, such as dialysis machines and  breathing machines (ventilators).  A DNR or DNAR order, which is the instruction not to use cardiopulmonary resuscitation (CPR) if breathing or heartbeat stops.  The use or non-use of tube feeding.  Withholding of food and fluids.  Comfort (palliative) care when the goal becomes comfort rather than a cure.  Organ and tissue donation. A living will does not give instructions for distributing your money and property if you should pass away. It is recommended that you seek the advice of a lawyer when writing a will. Decisions about taxes, beneficiaries, and asset distribution will be legally binding. This process can relieve your family and friends of any concerns surrounding disputes or questions that may come up about the distribution of your assets. DNR or DNAR A DNR or DNAR order is a request not to have CPR in the event that your heart stops beating or you stop breathing. If a DNR or DNAR order has not been made and shared, a health care provider will try to help any patient whose heart has stopped or who has stopped breathing. If you plan to have surgery, talk with your health care provider about how your DNR or DNAR order will be followed if problems occur. Summary  Advance directives are the legal documents that allow you to make choices ahead of time about your health care and medical treatment in case you become unable to communicate for yourself.  The process of discussing and writing advance directives should happen over time. You can change the advance directives, even after you have signed them.  Advance directives include DNR or DNAR orders, living wills, and designating an agent as your medical power of attorney. This information is not intended to replace advice given to you by your health care provider. Make sure you discuss any questions you have with your health care provider. Document Released: 06/17/2007 Document Revised: 04/14/2018 Document Reviewed: 01/28/2016 Elsevier  Patient Education  [place].    BMI for Adults  Body mass index (BMI) is a number that is calculated from a person's weight and height. BMI may help to estimate how much of a person's weight is composed of fat. BMI can help identify those who may be at higher risk for certain medical problems. How is BMI used with adults? BMI is used as a screening tool to identify possible weight problems. It is used to check whether a person is obese, overweight, healthy weight, or underweight. How is BMI calculated? BMI measures your weight and compares it to your height. This can be done either in [place] ([place] or metric measurements. Note that charts are available to help you find your BMI quickly and easily without having to do these calculations yourself. To calculate your BMI in English (U.[place] measurements, your health care provider will: 1. Measure your weight in pounds (lb). 2. Multiply the number of pounds by 703. ?  For example, for a person who weighs 180 lb, multiply that number by 703, which equals 126,540. 3. Measure your height in inches (in). Then multiply that number by itself to get a measurement called "inches squared." ? For example, for a person who is 70 in tall, the "inches squared" measurement is 70 in x 70 in, which equals 4900 inches squared. 4. Divide the total from Step 2 (number of lb x 703) by the total from Step 3 (inches squared): 126,540  4900 = 25.8. This is your BMI. To calculate your BMI in metric measurements, your health care provider will: 1. Measure your weight in kilograms (kg). 2. Measure your height in meters (m). Then multiply that number by itself to get a measurement called "meters squared." ? For example, for a person who is 1.75 m tall, the "meters squared" measurement is 1.75 m x 1.75 m, which is equal to 3.1 meters squared. 3. Divide the number of kilograms (your weight) by the meters squared number. In this example: 70  3.1 = 22.6. This is your  BMI. How is BMI interpreted? To interpret your results, your health care provider will use BMI charts to identify whether you are underweight, normal weight, overweight, or obese. The following guidelines will be used:  Underweight: BMI less than 18.5.  Normal weight: BMI between 18.5 and 24.9.  Overweight: BMI between 25 and 29.9.  Obese: BMI of 30 and above. Please note:  Weight includes both fat and muscle, so someone with a muscular build, such as an athlete, may have a BMI that is higher than 24.9. In cases like these, BMI is not an accurate measure of body fat.  To determine if excess body fat is the cause of a BMI of 25 or higher, further assessments may need to be done by a health care provider.  BMI is usually interpreted in the same way for men and women. Why is BMI a useful tool? BMI is useful in two ways:  Identifying a weight problem that may be related to a medical condition, or that may increase the risk for medical problems.  Promoting lifestyle and diet changes in order to reach a healthy weight. Summary  Body mass index (BMI) is a number that is calculated from a person's weight and height.  BMI may help to estimate how much of a person's weight is composed of fat. BMI can help identify those who may be at higher risk for certain medical problems.  BMI can be measured using English measurements or metric measurements.  To interpret your results, your health care provider will use BMI charts to identify whether you are underweight, normal weight, overweight, or obese. This information is not intended to replace advice given to you by your health care provider. Make sure you discuss any questions you have with your health care provider. Document Released: 11/20/2003 Document Revised: 02/20/2017 Document Reviewed: 01/21/2017 Elsevier Patient Education  2020 [employer].

## 2018-11-08 NOTE — Progress Notes (Signed)
MEDICARE ANNUAL WELLNESS VISIT  11/08/2018  Telephone Visit Disclaimer This Medicare AWV was conducted by telephone due to national recommendations for restrictions regarding the COVID-19 Pandemic (e.g. social distancing).  I verified, using two identifiers, that I am speaking with Marcus Carr or their authorized healthcare agent. I discussed the limitations, risks, security, and privacy concerns of performing an evaluation and management service by telephone and the potential availability of an in-person appointment in the future. The patient expressed understanding and agreed to proceed.   Subjective:  Marcus Carr is a 78 y.o. male patient of [facility], Theador Hawthorne, FNP who had a Medicare Annual Wellness Visit today via telephone. Alyn is Retired and lives with their spouse. he has 2 children. he reports that he is socially active and does interact with friends/family regularly. he is not physically active and enjoys gardening.  Patient Care Team: Sharion Balloon, FNP as PCP - General (Nurse Practitioner) Arnetha Courser (Inactive) as Surgeon Verlin Grills, MD as Attending Physician (Infectious Diseases)  Advanced Directives 11/08/2018 07/15/2017 07/08/2017 11/06/2014  Does Patient Have a Medical Advance Directive? No No No No  Would patient like information on creating a medical advance directive? Yes (MAU/Ambulatory/Procedural Areas - Information given) Yes (MAU/Ambulatory/Procedural Areas - Information given) Yes (MAU/Ambulatory/Procedural Areas - Information given) -    Hospital Utilization Over the Past 12 Months: # of hospitalizations or ER visits: 0 # of surgeries: 0  Review of Systems    Patient reports that his overall health is unchanged compared to last year.  Patient Reported Readings (BP, Pulse, CBG, Weight, etc) none  Review of Systems: History obtained from chart review and the patient General ROS: negative  All other systems negative.  Pain  Assessment Pain : No/denies pain     Current Medications & Allergies (verified) Allergies as of 11/08/2018      Reactions   Penicillins Rash      Medication List       Accurate as of November 08, 2018  1:43 PM. If you have any questions, ask your nurse or doctor.        STOP taking these medications   doxycycline 100 MG tablet Commonly known as: VIBRA-TABS     TAKE these medications   albuterol 108 (90 Base) MCG/ACT inhaler Commonly known as: VENTOLIN HFA Inhale 2 puffs into the lungs every 6 (six) hours as needed for wheezing or shortness of breath.   aspirin 325 MG tablet Take 325 mg by mouth daily.   atorvastatin 20 MG tablet Commonly known as: LIPITOR Take 1 tablet (20 mg total) by mouth daily. (Needs to be seen before next refill)   cholecalciferol 10 MCG (400 UNIT) Tabs tablet Commonly known as: VITAMIN D3 Take 800 Units by mouth daily.   citalopram 20 MG tablet Commonly known as: CELEXA Take 1 tablet (20 mg total) by mouth daily.   diclofenac sodium 1 % Gel Commonly known as: Voltaren Apply 4 g topically 4 (four) times daily. To left knee for pain   fish oil-omega-3 fatty acids 1000 MG capsule Take 1 g by mouth 2 (two) times daily.   hydrochlorothiazide 12.5 MG capsule Commonly known as: MICROZIDE Take 1 capsule (12.5 mg total) by mouth daily.   magnesium (amino acid chelate) 133 MG tablet Take 1 tablet by mouth daily.   NAPROXEN PO Take 500 mg by mouth 2 (two) times a day.   oxyCODONE-acetaminophen 10-325 MG tablet Commonly known as: PERCOCET Take 1 tablet by  mouth every 12 (twelve) hours as needed.   verapamil 180 MG CR tablet Commonly known as: CALAN-SR TAKE 1 TABLET (180 MG TOTAL) BY MOUTH AT BEDTIME.       History (reviewed): Past Medical History:  Diagnosis Date  . Abscess, jaw   . Anxiety   . Back pain    chronic back pain treated by Dr. Nelva Bush  . Chronic kidney disease   . Chronic mastoiditis of both sides 11/07/2014  .  Cigarette smoker 11/07/2014  . Gout   . Hyperlipidemia   . Hypertension   . Morbid obesity ([place]) 11/07/2014  . Osteomyelitis of mandible 11/07/2014  . Poor dentition 11/07/2014  . Thyroid nodule 11/07/2014   Past Surgical History:  Procedure Laterality Date  . APPENDECTOMY    . TONSILLECTOMY     Family History  Problem Relation Age of Onset  . Cancer Mother        esophageal  . Heart attack Father 48   Social History   Socioeconomic History  . Marital status: Married    Spouse name: Marcus Carr  . Number of children: 2  . Years of education: 8  . Highest education level: 8th grade  Occupational History  . Occupation: Retired  Scientific laboratory technician  . Financial resource strain: Not on file  . Food insecurity    Worry: Never true    Inability: Never true  . Transportation needs    Medical: No    Non-medical: No  Tobacco Use  . Smoking status: Former Smoker    Packs/day: 1.50    Years: 50.00    Pack years: 75.00    Types: Cigarettes    Quit date: 10/24/2014    Years since quitting: 4.0  . Smokeless tobacco: Never Used  Substance and Sexual Activity  . Alcohol use: No    Alcohol/week: 0.0 standard drinks  . Drug use: No  . Sexual activity: Not on file  Lifestyle  . Physical activity    Days per week: 0 days    Minutes per session: 0 min  . Stress: Not at all  Relationships  . Social connections    Talks on phone: More than three times a week    Gets together: More than three times a week    Attends religious service: Never    Active member of club or organization: No    Attends meetings of clubs or organizations: Never    Relationship status: Married  Other Topics Concern  . Not on file  Social History Narrative   Lives wife.  Two daughters.      Activities of Daily Living In your present state of health, do you have any difficulty performing the following activities: 11/08/2018  Hearing? N  Vision? N  Difficulty concentrating or making decisions? N  Walking or  climbing stairs? N  Dressing or bathing? N  Doing errands, shopping? N  Preparing Food and eating ? N  Using the Toilet? N  In the past six months, have you accidently leaked urine? N  Do you have problems with loss of bowel control? N  Managing your Medications? N  Managing your Finances? N  Housekeeping or managing your Housekeeping? N  Some recent data might be hidden    Patient Education/ Literacy How often do you need to have someone help you when you read instructions, pamphlets, or other written materials from your doctor or pharmacy?: 1 - Never What is the last grade level you completed in school?: 8th Grade  Exercise Current Exercise Habits: The patient does not participate in regular exercise at present  Diet Patient reports consuming 2 meals a day and 2 snack(s) a day Patient reports that his primary diet is: Regular Patient reports that she does have regular access to food.   Depression Screen PHQ 2/9 Scores 09/09/2018 04/14/2018 02/11/2018 07/15/2017 07/08/2017 10/22/2016 04/24/2016  PHQ - 2 Score 0 0 0 0 0 0 0     Fall Risk Fall Risk  09/09/2018 04/14/2018 02/11/2018 07/15/2017 07/08/2017  Falls in the past year? 0 0 0 No No  Risk for fall due to : - - - - -  Risk for fall due to: Comment - - - - -     Objective:  Marcus Carr seemed alert and oriented and he participated appropriately during our telephone visit.  Blood Pressure Weight BMI  BP Readings from Last 3 Encounters:  09/09/18 138/79  04/14/18 135/74  02/11/18 (!) 156/86   Wt Readings from Last 3 Encounters:  11/08/18 276 lb (125.2 kg)  09/09/18 276 lb (125.2 kg)  04/14/18 273 lb (123.8 kg)   BMI Readings from Last 1 Encounters:  11/08/18 43.23 kg/m    *Unable to obtain current vital signs, weight, and BMI due to telephone visit type  Hearing/Vision  . Spiro did not seem to have difficulty with hearing/understanding during the telephone conversation . Reports that he has not had a formal eye  exam by an eye care professional within the past year . Reports that he has not had a formal hearing evaluation within the past year *Unable to fully assess hearing and vision during telephone visit type  Cognitive Function: 6CIT Screen 11/08/2018  What Year? 0 points  What month? 0 points  What time? 0 points  Count back from 20 2 points  Months in reverse 2 points  Repeat phrase 2 points  Total Score 6   (Normal:0-7, Significant for Dysfunction: >8)  Normal Cognitive Function Screening: Yes   Immunization & Health Maintenance Record Immunization History  Administered Date(s) Administered  . Influenza Split 01/05/2013  . Influenza, High Dose Seasonal PF 01/21/2014, 01/01/2015, 01/14/2016, 01/13/2017, 01/16/2018  . Influenza,inj,quad, With Preservative 12/22/2016, 01/13/2018  . Influenza-Unspecified 01/21/2014, 01/01/2015  . Pneumococcal Conjugate-13 10/24/2015  . Pneumococcal Polysaccharide-23 07/08/2017  . Tdap 07/08/2017    Health Maintenance  Topic Date Due  . INFLUENZA VACCINE  08/12/2019 (Originally 10/23/2018)  . TETANUS/TDAP  07/09/2027  . PNA vac Low Risk Adult  Completed       Assessment  This is a routine wellness examination for [employer]  Health Maintenance: Due or Overdue There are no preventive care reminders to display for this patient.  Marcus Carr does not need a referral for [employer] Assistance: Care Management:   no Social Work:    no Prescription Assistance:  no Nutrition/Diabetes Education:  no   Plan:  Personalized Goals Goals Addressed   None    Personalized Health Maintenance & Screening Recommendations  Influenza vaccine Advanced directives: has NO advanced directive  - add't info requested. Referral to SW: no Shingrix  Lung Cancer Screening Recommended: no (Low Dose CT Chest recommended if Age 52-80 years, 30 pack-year currently smoking OR have quit w/in past 15 years) Hepatitis C Screening recommended: no HIV  Screening recommended: no  Advanced Directives: Written information was prepared per patient's request.  Referrals & Orders No orders of the defined types were placed in this encounter.   Follow-up Plan . Follow-up with Lenna Gilford,  Theador Hawthorne, FNP as planned    I have personally reviewed and noted the following in the patient's chart:   . Medical and social history . Use of alcohol, tobacco or illicit drugs  . Current medications and supplements . Functional ability and status . Nutritional status . Physical activity . Advanced directives . List of other physicians . Hospitalizations, surgeries, and ER visits in previous 12 months . Vitals . Screenings to include cognitive, depression, and falls . Referrals and appointments  In addition, I have reviewed and discussed with Marcus Carr certain preventive protocols, quality metrics, and best practice recommendations. A written personalized care plan for preventive services as well as general preventive health recommendations is available and can be mailed to the patient at his request.      Wardell Heath, LPN  8/59/2763  I have reviewed and agree with the above  documentation.   [name], FNP

## 2018-12-17 ENCOUNTER — Other Ambulatory Visit: Payer: Self-pay | Admitting: Family

## 2018-12-17 DIAGNOSIS — F419 Anxiety disorder, unspecified: Secondary | ICD-10-CM

## 2018-12-17 DIAGNOSIS — I10 Essential (primary) hypertension: Secondary | ICD-10-CM

## 2019-01-10 ENCOUNTER — Other Ambulatory Visit: Payer: Self-pay | Admitting: Family

## 2019-01-11 DIAGNOSIS — Z79891 Long term (current) use of opiate analgesic: Secondary | ICD-10-CM | POA: Diagnosis not present

## 2019-03-31 ENCOUNTER — Other Ambulatory Visit: Payer: Self-pay | Admitting: Family

## 2019-03-31 DIAGNOSIS — I10 Essential (primary) hypertension: Secondary | ICD-10-CM

## 2019-04-26 ENCOUNTER — Other Ambulatory Visit: Payer: Self-pay | Admitting: Family

## 2019-04-26 DIAGNOSIS — I10 Essential (primary) hypertension: Secondary | ICD-10-CM

## 2019-04-26 DIAGNOSIS — F419 Anxiety disorder, unspecified: Secondary | ICD-10-CM

## 2019-04-28 ENCOUNTER — Other Ambulatory Visit: Payer: Self-pay | Admitting: Family

## 2019-04-28 DIAGNOSIS — I10 Essential (primary) hypertension: Secondary | ICD-10-CM

## 2019-04-28 NOTE — Telephone Encounter (Signed)
[facility]. NTBS 30 days given 04/01/19

## 2019-04-28 NOTE — Telephone Encounter (Signed)
Lmtcb.

## 2019-04-28 NOTE — Telephone Encounter (Signed)
Appt made

## 2019-05-04 ENCOUNTER — Other Ambulatory Visit: Payer: Self-pay

## 2019-05-05 ENCOUNTER — Encounter: Payer: Self-pay | Admitting: Family

## 2019-05-05 ENCOUNTER — Ambulatory Visit (INDEPENDENT_AMBULATORY_CARE_PROVIDER_SITE_OTHER): Payer: Medicare Other | Admitting: Family

## 2019-05-05 VITALS — BP 153/86 | HR 77 | Temp 96.2°F | Ht 67.0 in | Wt 276.2 lb

## 2019-05-05 DIAGNOSIS — E8881 Metabolic syndrome: Secondary | ICD-10-CM | POA: Diagnosis not present

## 2019-05-05 DIAGNOSIS — G8929 Other chronic pain: Secondary | ICD-10-CM

## 2019-05-05 DIAGNOSIS — M549 Dorsalgia, unspecified: Secondary | ICD-10-CM

## 2019-05-05 DIAGNOSIS — E785 Hyperlipidemia, unspecified: Secondary | ICD-10-CM

## 2019-05-05 DIAGNOSIS — Z87891 Personal history of nicotine dependence: Secondary | ICD-10-CM

## 2019-05-05 DIAGNOSIS — I10 Essential (primary) hypertension: Secondary | ICD-10-CM | POA: Diagnosis not present

## 2019-05-05 DIAGNOSIS — N183 Chronic kidney disease, stage 3 unspecified: Secondary | ICD-10-CM | POA: Diagnosis not present

## 2019-05-05 DIAGNOSIS — F419 Anxiety disorder, unspecified: Secondary | ICD-10-CM

## 2019-05-05 MED ORDER — VERAPAMIL HCL ER 180 MG PO TBCR: EXTENDED_RELEASE_TABLET | ORAL | 0 refills | Status: DC

## 2019-05-05 MED ORDER — CITALOPRAM HYDROBROMIDE 20 MG PO TABS: 20.0000 mg | ORAL_TABLET | Freq: Every day | ORAL | 0 refills | Status: DC

## 2019-05-05 MED ORDER — HYDROCHLOROTHIAZIDE 12.5 MG PO CAPS: 12.5000 mg | ORAL_CAPSULE | Freq: Every day | ORAL | 0 refills | Status: DC

## 2019-05-05 MED ORDER — ATORVASTATIN CALCIUM 20 MG PO TABS: 20.0000 mg | ORAL_TABLET | Freq: Every day | ORAL | 1 refills | Status: DC

## 2019-05-05 MED ORDER — ALBUTEROL SULFATE HFA 108 (90 BASE) MCG/ACT IN AERS: INHALATION_SPRAY | RESPIRATORY_TRACT | 1 refills | Status: DC

## 2019-05-05 NOTE — Progress Notes (Signed)
Subjective:    Patient ID: Marcus Carr, male    DOB: 02/02/1941, 79 y.o.   MRN: [id number]  Chief Complaint  Patient presents with  . Medical Management of Chronic Issues    Wants letter to get mail box moved    Pt presents to the office today for chronic follow up. PT has history of osteomyelitis of mandible, doing stable at this time. Pt is followed by Pain Management every 3 months for chronic back pain.  Pt had his first COVID vaccine three weeks ago and is scheduled for his second vaccine next week.  Hypertension This is a chronic problem. The current episode started more than 1 year ago. The problem has been waxing and waning since onset. The problem is uncontrolled. Associated symptoms include anxiety. Pertinent negatives include no malaise/fatigue, peripheral edema or shortness of breath. Risk factors for coronary artery disease include dyslipidemia, obesity, male gender and sedentary lifestyle. The current treatment provides moderate improvement. There is no history of kidney disease, CAD/MI or heart failure.  Hyperlipidemia This is a chronic problem. The current episode started more than 1 year ago. The problem is controlled. Recent lipid tests were reviewed and are normal. Pertinent negatives include no shortness of breath. Current antihyperlipidemic treatment includes statins. The current treatment provides moderate improvement of lipids. Risk factors for coronary artery disease include a sedentary lifestyle, male sex, hypertension and dyslipidemia.  Anxiety Presents for follow-up visit. Symptoms include depressed mood, excessive worry, irritability and nervous/anxious behavior. Patient reports no shortness of breath. Symptoms occur occasionally. The quality of sleep is good.    Back Pain This is a chronic problem. The current episode started more than 1 year ago. The problem occurs intermittently. The problem has been waxing and waning since onset. The pain is present in  the lumbar spine. The quality of the pain is described as aching. The pain is at a severity of 3/10. The pain is moderate.   Metabolic Syndrome Pt takes Lipitor daily. Does not do any scheduled exercising.    Review of Systems  Constitutional: Positive for irritability. Negative for malaise/fatigue.  Respiratory: Negative for shortness of breath.   Musculoskeletal: Positive for back pain.  Psychiatric/Behavioral: The patient is nervous/anxious.   All other systems reviewed and are negative.      Objective:   Physical Exam Vitals reviewed.  Constitutional:      General: He is not in acute distress.    Appearance: He is well-developed.  HENT:     Head: Normocephalic.     Right Ear: Tympanic membrane normal.     Left Ear: Tympanic membrane normal.  Eyes:     General:        Right eye: No discharge.        Left eye: No discharge.     Pupils: Pupils are equal, round, and reactive to light.  Neck:     Thyroid: No thyromegaly.  Cardiovascular:     Rate and Rhythm: Normal rate and regular rhythm.     Heart sounds: Normal heart sounds. No murmur.  Pulmonary:     Effort: Pulmonary effort is normal. No respiratory distress.     Breath sounds: Normal breath sounds. No wheezing.  Abdominal:     General: Bowel sounds are normal. There is no distension.     Palpations: Abdomen is soft.     Tenderness: There is no abdominal tenderness.  Musculoskeletal:        General: No tenderness.  Cervical back: Normal range of motion and neck supple.     Comments: Pain in lumbar with flexion and extension  Skin:    General: Skin is warm and dry.     Findings: No erythema or rash.  Neurological:     Mental Status: He is alert and oriented to person, place, and time.     Cranial Nerves: No cranial nerve deficit.     Deep Tendon Reflexes: Reflexes are normal and symmetric.  Psychiatric:        Behavior: Behavior normal.        Thought Content: Thought content normal.        Judgment:  Judgment normal.       BP (!) 180/97   Pulse 66   Temp (!) 96.2 F (35.7 C) (Temporal)   Ht '5\' 7"'  (1.702 m)   Wt 276 lb 3.2 oz (125.3 kg)   SpO2 96%   BMI 43.26 kg/m      Assessment & Plan:  Rei Medlen comes in today with chief complaint of Medical Management of Chronic Issues (Wants letter to get mail box moved )   Diagnosis and orders addressed:  1. Hyperlipidemia, unspecified hyperlipidemia type - atorvastatin (LIPITOR) 20 MG tablet; Take 1 tablet (20 mg total) by mouth daily. (Needs to be seen before next refill)  Dispense: 90 tablet; Refill: 1 - CMP14+EGFR - CBC with Differential/Platelet  2. Metabolic syndrome - atorvastatin (LIPITOR) 20 MG tablet; Take 1 tablet (20 mg total) by mouth daily. (Needs to be seen before next refill)  Dispense: 90 tablet; Refill: 1 - CMP14+EGFR - CBC with Differential/Platelet  3. Essential hypertension - verapamil (CALAN-SR) 180 MG CR tablet; TAKE 1 TABLET (180 MG TOTAL) BY MOUTH AT BEDTIME.(Needs to be seen before next refill)  Dispense: 30 tablet; Refill: 0 - hydrochlorothiazide (MICROZIDE) 12.5 MG capsule; Take 1 capsule (12.5 mg total) by mouth daily. (Needs to be seen before next refill)  Dispense: 30 capsule; Refill: 0 - CMP14+EGFR - CBC with Differential/Platelet  4. Anxiety - citalopram (CELEXA) 20 MG tablet; Take 1 tablet (20 mg total) by mouth daily. (Needs to be seen before next refill)  Dispense: 30 tablet; Refill: 0 - CMP14+EGFR - CBC with Differential/Platelet  5. Stage 3 chronic kidney disease, unspecified whether stage 3a or 3b CKD - CMP14+EGFR - CBC with Differential/Platelet  6. Chronic bilateral back pain, unspecified back location - CMP14+EGFR - CBC with Differential/Platelet  7. Morbid obesity ([place]) - CMP14+EGFR - CBC with Differential/Platelet  8. Hx of smoking - CMP14+EGFR - CBC with Differential/Platelet   Labs pending Health Maintenance reviewed Diet and exercise encouraged  Follow up  plan: 6 months   [name], FNP

## 2019-05-05 NOTE — Patient Instructions (Signed)

## 2019-05-06 LAB — CMP14+EGFR
ALT: 16 IU/L (ref 0–44)
AST: 23 IU/L (ref 0–40)
Albumin/Globulin Ratio: 1.5 (ref 1.2–2.2)
Albumin: 4.3 g/dL (ref 3.7–4.7)
Alkaline Phosphatase: 89 IU/L (ref 39–117)
BUN/Creatinine Ratio: 16 (ref 10–24)
BUN: 23 mg/dL (ref 8–27)
Bilirubin Total: 0.6 mg/dL (ref 0.0–1.2)
CO2: 25 mmol/L (ref 20–29)
Calcium: 9.3 mg/dL (ref 8.6–10.2)
Chloride: 101 mmol/L (ref 96–106)
Creatinine, Ser: 1.41 mg/dL — ABNORMAL HIGH (ref 0.76–1.27)
GFR calc Af Amer: 55 mL/min/{1.73_m2} — ABNORMAL LOW (ref >59)
GFR calc non Af Amer: 48 mL/min/{1.73_m2} — ABNORMAL LOW (ref >59)
Globulin, Total: 2.9 g/dL (ref 1.5–4.5)
Glucose: 81 mg/dL (ref 65–99)
Potassium: 5 mmol/L (ref 3.5–5.2)
Sodium: 140 mmol/L (ref 134–144)
Total Protein: 7.2 g/dL (ref 6.0–8.5)

## 2019-05-06 LAB — CBC WITH DIFFERENTIAL/PLATELET
Basophils Absolute: 0 10*3/uL (ref 0.0–0.2)
Basos: 0 %
EOS (ABSOLUTE): 0.1 10*3/uL (ref 0.0–0.4)
Eos: 2 %
Hematocrit: 42 % (ref 37.5–51.0)
Hemoglobin: 14 g/dL (ref 13.0–17.7)
Immature Grans (Abs): 0 10*3/uL (ref 0.0–0.1)
Immature Granulocytes: 0 %
Lymphocytes Absolute: 1.9 10*3/uL (ref 0.7–3.1)
Lymphs: 34 %
MCH: 29.1 pg (ref 26.6–33.0)
MCHC: 33.3 g/dL (ref 31.5–35.7)
MCV: 87 fL (ref 79–97)
Monocytes Absolute: 0.4 10*3/uL (ref 0.1–0.9)
Monocytes: 7 %
Neutrophils Absolute: 3.1 10*3/uL (ref 1.4–7.0)
Neutrophils: 57 %
Platelets: 128 10*3/uL — ABNORMAL LOW (ref 150–450)
RBC: 4.81 x10E6/uL (ref 4.14–5.80)
RDW: 14.2 % (ref 11.6–15.4)
WBC: 5.5 10*3/uL (ref 3.4–10.8)

## 2019-05-11 DIAGNOSIS — Z79899 Other long term (current) drug therapy: Secondary | ICD-10-CM | POA: Diagnosis not present

## 2019-05-11 DIAGNOSIS — Z5181 Encounter for therapeutic drug level monitoring: Secondary | ICD-10-CM | POA: Diagnosis not present

## 2019-05-27 ENCOUNTER — Other Ambulatory Visit: Payer: Self-pay | Admitting: Family

## 2019-05-27 DIAGNOSIS — I10 Essential (primary) hypertension: Secondary | ICD-10-CM

## 2019-06-01 ENCOUNTER — Telehealth: Payer: Self-pay | Admitting: Family

## 2019-06-01 NOTE — Chronic Care Management (AMB) (Signed)
  Chronic Care Management   Note  06/01/2019 Name: Marcus Carr MRN: [id number] DOB: [birth year]  Marcus Carr is a 79 y.o. year old male who is a primary care patient of Sharion Balloon, FNP. I reached out to [employer] by phone today in response to a referral sent by [name]'s health plan.     [name] was given information about Chronic Care Management services today including:  1. CCM service includes personalized support from designated clinical staff supervised by his physician, including individualized plan of care and coordination with other care providers 2. 24/7 contact phone numbers for assistance for urgent and routine care needs. 3. Service will only be billed when office clinical staff spend 20 minutes or more in a month to coordinate care. 4. Only one practitioner may furnish and bill the service in a calendar month. 5. The patient may stop CCM services at any time (effective at the end of the month) by phone call to the office staff. 6. The patient will be responsible for cost sharing (co-pay) of up to 20% of the service fee (after annual deductible is met).  Patient agreed to services and verbal consent obtained.   Follow up plan: Telephone appointment with care management team member scheduled for: 08/19/2019.  [place], [place] [address] Direct Dial: [phone] [email or web address]: [email or web address]

## 2019-06-06 ENCOUNTER — Telehealth: Payer: Self-pay | Admitting: Family

## 2019-06-06 NOTE — Telephone Encounter (Signed)
Aware.  Script was done for 90 day supply.  [facility].  [facility] will talk to [facility] about shortage of pills.

## 2019-06-06 NOTE — Telephone Encounter (Signed)
  Medication Request  06/06/2019  What is the name of the medication?albuterol (VENTOLIN HFA) 108 (90 Base) , verapamil (CALAN-SR) 180 MG CR tablet / pt needs 90 day supply on all meds  Have you contacted your pharmacy to request a refill? yes  Which pharmacy would you like this sent to? [facility]   Patient notified that their request is being sent to the clinical staff for review and that they should receive a call once it is complete. If they do not receive a call within 24 hours they can check with their pharmacy or our office.

## 2019-06-14 ENCOUNTER — Other Ambulatory Visit: Payer: Self-pay | Admitting: Family

## 2019-06-14 DIAGNOSIS — F419 Anxiety disorder, unspecified: Secondary | ICD-10-CM

## 2019-08-19 ENCOUNTER — Ambulatory Visit: Payer: Medicare Other | Admitting: *Deleted

## 2019-08-19 DIAGNOSIS — I10 Essential (primary) hypertension: Secondary | ICD-10-CM

## 2019-08-19 DIAGNOSIS — N183 Chronic kidney disease, stage 3 unspecified: Secondary | ICD-10-CM

## 2019-08-19 NOTE — Chronic Care Management (AMB) (Addendum)
  Chronic Care Management   Initial Visit Note  08/19/2019 Name: Marcus Carr MRN: [id number] DOB: May 10, 1940  Referred by: Sharion Balloon, FNP Reason for referral : No chief complaint on file.   An unsuccessful telephone outreach was attempted today. I spoke with the patient's wife and he was unavailable at the time of the call and she requested that I call back to talk with him later. The patient was referred to the case management team for assistance with care management and care coordination.   Follow Up Plan: The care management team will reach out to the patient again over the next 10 days.   Chong Sicilian, BSN, RN-BC Embedded Chronic Care Manager [facility] Direct Dial: [phone]   I have reviewed and agree with the above  documentation.   [name], FNP

## 2019-08-25 ENCOUNTER — Other Ambulatory Visit: Payer: Self-pay | Admitting: Family

## 2019-08-25 DIAGNOSIS — E785 Hyperlipidemia, unspecified: Secondary | ICD-10-CM

## 2019-08-25 DIAGNOSIS — E8881 Metabolic syndrome: Secondary | ICD-10-CM

## 2019-09-07 DIAGNOSIS — M545 Low back pain: Secondary | ICD-10-CM | POA: Diagnosis not present

## 2019-09-07 DIAGNOSIS — Z79891 Long term (current) use of opiate analgesic: Secondary | ICD-10-CM | POA: Diagnosis not present

## 2019-09-24 ENCOUNTER — Other Ambulatory Visit: Payer: Self-pay | Admitting: Family

## 2019-09-24 DIAGNOSIS — F419 Anxiety disorder, unspecified: Secondary | ICD-10-CM

## 2019-10-17 ENCOUNTER — Encounter: Payer: Self-pay | Admitting: Family Medicine

## 2019-10-17 ENCOUNTER — Other Ambulatory Visit: Payer: Self-pay

## 2019-10-17 ENCOUNTER — Ambulatory Visit (INDEPENDENT_AMBULATORY_CARE_PROVIDER_SITE_OTHER): Payer: Medicare Other | Admitting: Family Medicine

## 2019-10-17 VITALS — BP 132/85 | HR 60 | Temp 97.9°F | Ht 67.0 in | Wt 279.0 lb

## 2019-10-17 DIAGNOSIS — M1710 Unilateral primary osteoarthritis, unspecified knee: Secondary | ICD-10-CM

## 2019-10-17 MED ORDER — PREDNISONE 10 MG PO TABS
ORAL_TABLET | ORAL | 0 refills | Status: DC
Start: 2019-10-17 — End: 2019-11-15

## 2019-10-17 NOTE — Progress Notes (Signed)
Chief Complaint  Patient presents with  . Knee Pain    Right, Sees ortho in 10 days    HPI  Patient presents today for patient seeing orthopedist, Dr. Stann Mainland, for his knee.  Dr. Stann Mainland put him on naproxen.  That is no longer effective for him.  He does have an appointment for 10 days from now with Dr. Stann Mainland.  Apparently he is being considered for an injection.  Patient also has chronic back pain.  He is seen by Dr. Herma Mering of Ortho and prescribed oxycodone for that by him.  PMH: Smoking status noted ROS: Per HPI  Objective:Gen: NAD, alert, cooperative with exam HEENT: NCAT, EOMI, PERRL CV: RRR, good S1/S2, no murmur Resp: CTABL, no wheezes, non-labored Ext: Mild edema about the right knee joint.  Full range of motion negative drawer and McMurray signs.  No opening with collateral stress. Neuro: Alert and oriented, No gross deficits  Assessment and plan:  1. Arthritis of knee     No orders of the defined types were placed in this encounter.   No orders of the defined types were placed in this encounter.   Follow up as needed.  Claretta Fraise, MD

## 2019-10-18 ENCOUNTER — Telehealth: Payer: Self-pay | Admitting: *Deleted

## 2019-10-18 ENCOUNTER — Telehealth: Payer: Medicare Other | Admitting: *Deleted

## 2019-10-18 NOTE — Telephone Encounter (Signed)
  Chronic Care Management   Initial Visit Outreach Note  10/18/2019 Name: Marcus Carr MRN: [id number] DOB: 05-30-1940  Referred by: Sharion Balloon, FNP Reason for referral : Chronic Care Management (Initial Visit: HTN, CKD, HLD, metabolic syndrome, anxiety)   An unsuccessful Initial Telephone outreach was attempted today. The patient was referred to the case management team for assistance with care management and care coordination.   Clinical Goals: . Over the next 10 days, patient will be contacted by a Care Guide to reschedule their Initial CCM Visit . Over the next 30 days, patient will have an Initial CCM Visit with a member of the embedded CCM team to discuss self-management of their chronic medical conditions  Interventions and Plan . Chart reviewed in preparation for initial visit telephone call . Collaboration with other care team members as needed . Unsuccessful outreach to patient  . A HIPPA compliant phone message was left for the patient providing contact information and requesting a return call.  . Request sent to care guides to reach out and reschedule patient's initial visit   Chong Sicilian, BSN, RN-BC [facility] Direct Dial: [phone]

## 2019-10-19 ENCOUNTER — Telehealth: Payer: Self-pay | Admitting: *Deleted

## 2019-10-19 NOTE — Chronic Care Management (AMB) (Signed)
  Chronic Care Management   Note  10/19/2019 Name: Marcus Carr MRN: [id number] DOB: Jul 29, 1940  [name] is a 79 y.o. year old male who is a primary care patient of Sharion Balloon, FNP and is actively engaged with the care management team. I reached out to [employer] by phone today to assist with re-scheduling an initial visit with the RN Case Manager.  Follow up plan: Telephone appointment with care management team member scheduled for: 11/30/2019  [facility]  [place], [place] [address] Direct Dial: [email or web address] Website: [email or web address]

## 2019-10-19 NOTE — Telephone Encounter (Signed)
Patient rescheduled for 11/30/2019.

## 2019-10-23 ENCOUNTER — Other Ambulatory Visit: Payer: Self-pay | Admitting: Family

## 2019-10-23 DIAGNOSIS — F419 Anxiety disorder, unspecified: Secondary | ICD-10-CM

## 2019-10-25 DIAGNOSIS — M1711 Unilateral primary osteoarthritis, right knee: Secondary | ICD-10-CM | POA: Diagnosis not present

## 2019-10-25 DIAGNOSIS — M25561 Pain in right knee: Secondary | ICD-10-CM | POA: Diagnosis not present

## 2019-11-01 ENCOUNTER — Other Ambulatory Visit: Payer: Self-pay | Admitting: Family

## 2019-11-01 DIAGNOSIS — F419 Anxiety disorder, unspecified: Secondary | ICD-10-CM

## 2019-11-02 NOTE — Telephone Encounter (Signed)
[employer] NTBS 30 days given 09/24/19

## 2019-11-02 NOTE — Telephone Encounter (Signed)
Appt made

## 2019-11-09 ENCOUNTER — Other Ambulatory Visit: Payer: Self-pay | Admitting: Family

## 2019-11-09 DIAGNOSIS — F419 Anxiety disorder, unspecified: Secondary | ICD-10-CM

## 2019-11-10 NOTE — Telephone Encounter (Signed)
Has f/u appt 11/14/19

## 2019-11-11 DIAGNOSIS — Z20822 Contact with and (suspected) exposure to covid-19: Secondary | ICD-10-CM | POA: Diagnosis not present

## 2019-11-14 ENCOUNTER — Ambulatory Visit: Payer: Medicare Other | Admitting: Family

## 2019-11-15 ENCOUNTER — Ambulatory Visit (INDEPENDENT_AMBULATORY_CARE_PROVIDER_SITE_OTHER): Payer: Medicare Other

## 2019-11-15 DIAGNOSIS — Z0000 Encounter for general adult medical examination without abnormal findings: Secondary | ICD-10-CM

## 2019-11-15 NOTE — Patient Instructions (Signed)
  [facility] Maintenance Summary and Written Plan of Care  Marcus Carr ,  Thank you for allowing me to perform your Medicare Annual Wellness Visit and for your ongoing commitment to your health.   Health Maintenance & Immunization History Health Maintenance  Topic Date Due  . Hepatitis C Screening  Never done  . COVID-19 Vaccine (1) Never done  . INFLUENZA VACCINE  10/23/2019  . TETANUS/TDAP  07/09/2027  . PNA vac Low Risk Adult  Completed   Immunization History  Administered Date(s) Administered  . Influenza Split 01/05/2013  . Influenza, High Dose Seasonal PF 01/21/2014, 01/01/2015, 01/14/2016, 01/13/2017, 01/16/2018, 12/19/2018  . Influenza,inj,quad, With Preservative 12/22/2016, 01/13/2018  . Influenza-Unspecified 01/21/2014, 01/01/2015  . Pneumococcal Conjugate-13 10/24/2015  . Pneumococcal Polysaccharide-23 07/08/2017  . Tdap 07/08/2017    These are the patient goals that we discussed: Goals Addressed              This Visit's Progress     Patient Stated   .  Weight (lb) < 200 lb (90.7 kg) (pt-stated)        Would like to loose 40 pounds         This is a list of Health Maintenance Items that are overdue or due now: Health Maintenance Due  Topic Date Due  . Hepatitis C Screening  Never done  . COVID-19 Vaccine (1) Never done  . INFLUENZA VACCINE  10/23/2019     Orders/Referrals Placed Today: No orders of the defined types were placed in this encounter.  (Contact our referral department at [phone] if you have not spoken with someone about your referral appointment within the next 5 days)    Follow-up Plan  Scheduled with [name], FNP 11/21/19 at 11:10

## 2019-11-15 NOTE — Progress Notes (Signed)
MEDICARE ANNUAL WELLNESS VISIT  11/15/2019  Telephone Visit Disclaimer This Medicare AWV was conducted by telephone due to national recommendations for restrictions regarding the COVID-19 Pandemic (e.g. social distancing).  I verified, using two identifiers, that I am speaking with Marcus Carr or their authorized healthcare agent. I discussed the limitations, risks, security, and privacy concerns of performing an evaluation and management service by telephone and the potential availability of an in-person appointment in the future. The patient expressed understanding and agreed to proceed.   Subjective:  Marcus Carr is a 79 y.o. male patient of Hawks, Marcus Hawthorne, FNP who had a Medicare Annual Wellness Visit today via telephone. Marcus Carr is Retired and lives with their spouse. he has two children. he reports that he is socially active and does interact with friends/family regularly. he is minimally physically active and enjoys working on [employer].  Patient Care Team: Marcus Balloon, FNP as PCP - General (Nurse Practitioner) Marcus Carr (Inactive) as Surgeon Marcus Grills, MD as Attending Physician (Infectious Diseases) Marcus China, RN as Registered Nurse  Advanced Directives 11/15/2019 11/08/2018 07/15/2017 07/08/2017 11/06/2014  Does Patient Have a Medical Advance Directive? No No No No No  Would patient like information on creating a medical advance directive? No - Patient declined Yes (MAU/Ambulatory/Procedural Areas - Information given) Yes (MAU/Ambulatory/Procedural Areas - Information given) Yes (MAU/Ambulatory/Procedural Areas - Information given) -    Hospital Utilization Over the Past 12 Months: # of hospitalizations or ER visits: 0 # of surgeries: 0  Review of Systems    Patient reports that his overall health is unchanged compared to last year.  Patient Reported Readings (BP, Pulse, CBG, Weight, etc) none  Pain Assessment Pain : No/denies pain      Current Medications & Allergies (verified) Allergies as of 11/15/2019      Reactions   Penicillins Rash      Medication List       Accurate as of November 15, 2019 12:03 PM. If you have any questions, ask your nurse or doctor.        STOP taking these medications   predniSONE 10 MG tablet Commonly known as: DELTASONE     TAKE these medications   albuterol 108 (90 Base) MCG/ACT inhaler Commonly known as: VENTOLIN HFA TAKE 2 PUFFS BY MOUTH EVERY 6 HOURS AS NEEDED FOR WHEEZE OR SHORTNESS OF BREATH   aspirin 325 MG tablet Take 325 mg by mouth daily.   atorvastatin 20 MG tablet Commonly known as: LIPITOR Take 1 tablet (20 mg total) by mouth daily.   cholecalciferol 10 MCG (400 UNIT) Tabs tablet Commonly known as: VITAMIN D3 Take 800 Units by mouth daily.   citalopram 20 MG tablet Commonly known as: CELEXA TAKE 1 TABLET BY MOUTH EVERY DAY   diclofenac sodium 1 % Gel Commonly known as: Voltaren Apply 4 g topically 4 (four) times daily. To left knee for pain   fish oil-omega-3 fatty acids 1000 MG capsule Take 1 g by mouth 2 (two) times daily.   hydrochlorothiazide 12.5 MG capsule Commonly known as: MICROZIDE TAKE 1 CAPSULE BY MOUTH DAILY. (NEEDS TO BE SEEN BEFORE NEXT REFILL)   magnesium (amino acid chelate) 133 MG tablet Take 1 tablet by mouth daily.   NAPROXEN PO Take 500 mg by mouth 2 (two) times a day.   oxyCODONE-acetaminophen 10-325 MG tablet Commonly known as: PERCOCET Take 1 tablet by mouth every 12 (twelve) hours as needed.   verapamil 180 MG CR  tablet Commonly known as: CALAN-SR TAKE 1 TABLET (180 MG TOTAL) BY MOUTH AT BEDTIME.(NEEDS TO BE SEEN BEFORE NEXT REFILL)       History (reviewed): Past Medical History:  Diagnosis Date  . Abscess, jaw   . Anxiety   . Back pain    chronic back pain treated by Dr. Nelva Bush  . Chronic kidney disease   . Chronic mastoiditis of both sides 11/07/2014  . Cigarette smoker 11/07/2014  . Gout   . Hyperlipidemia    . Hypertension   . Morbid obesity ([place]) 11/07/2014  . Osteomyelitis of mandible 11/07/2014  . Poor dentition 11/07/2014  . Thyroid nodule 11/07/2014   Past Surgical History:  Procedure Laterality Date  . APPENDECTOMY    . TONSILLECTOMY     Family History  Problem Relation Age of Onset  . Cancer Mother        esophageal  . Heart attack Father 23   Social History   Socioeconomic History  . Marital status: Married    Spouse name: [place]  . Number of children: 2  . Years of education: 8  . Highest education level: 8th grade  Occupational History  . Occupation: Retired  Tobacco Use  . Smoking status: Former Smoker    Packs/day: 1.50    Years: 50.00    Pack years: 75.00    Types: Cigarettes    Quit date: 10/24/2014    Years since quitting: 5.0  . Smokeless tobacco: Never Used  Vaping Use  . Vaping Use: Never used  Substance and Sexual Activity  . Alcohol use: No    Alcohol/week: 0.0 standard drinks  . Drug use: No  . Sexual activity: Not on file  Other Topics Concern  . Not on file  Social History Narrative   Lives wife.  Two daughters.     Social Determinants of Health   Financial Resource Strain:   . Difficulty of Paying Living Expenses: Not on file  Food Insecurity:   . Worried About Charity fundraiser in the Last Year: Not on file  . Ran Out of Food in the Last Year: Not on file  Transportation Needs:   . Lack of Transportation (Medical): Not on file  . Lack of Transportation (Non-Medical): Not on file  Physical Activity:   . Days of Exercise per Week: Not on file  . Minutes of Exercise per Session: Not on file  Stress:   . Feeling of Stress : Not on file  Social Connections:   . Frequency of Communication with Friends and Family: Not on file  . Frequency of Social Gatherings with Friends and Family: Not on file  . Attends Religious Services: Not on file  . Active Member of Clubs or Organizations: Not on file  . Attends Archivist Meetings: Not  on file  . Marital Status: Not on file    Activities of Daily Living In your present state of health, do you have any difficulty performing the following activities: 11/15/2019  Hearing? N  Vision? N  Difficulty concentrating or making decisions? N  Walking or climbing stairs? N  Dressing or bathing? N  Doing errands, shopping? N  Preparing Food and eating ? N  Using the Toilet? N  In the past six months, have you accidently leaked urine? N  Do you have problems with loss of bowel control? N  Managing your Medications? N  Managing your Finances? N  Housekeeping or managing your Housekeeping? N  Some recent data might  be hidden    Patient Education/ Literacy What is the last grade level you completed in school?: 8th grade  Exercise Current Exercise Habits: The patient does not participate in regular exercise at present  Diet Patient reports consuming 3 meals a day and 2 snack(s) a day Patient reports that his primary diet is: Regular Patient reports that she does have regular access to food.   Depression Screen PHQ 2/9 Scores 11/15/2019 10/17/2019 05/05/2019 09/09/2018 04/14/2018 02/11/2018 07/15/2017  PHQ - 2 Score 0 0 3 0 0 0 0  PHQ- 9 Score - - 7 - - - -     Fall Risk Fall Risk  11/15/2019 10/17/2019 05/05/2019 09/09/2018 04/14/2018  Falls in the past year? 1 0 0 0 0  Number falls in past yr: 0 0 - - -  Injury with Fall? 0 0 - - -  Risk for fall due to : - Impaired mobility;Impaired balance/gait - - -  Risk for fall due to: Comment - - - - -  Follow up Falls evaluation completed Falls evaluation completed - - -     Objective:  Marcus Carr seemed alert and oriented and he participated appropriately during our telephone visit.  Blood Pressure Weight BMI  BP Readings from Last 3 Encounters:  10/17/19 (!) 132/85  05/05/19 (!) 153/86  09/09/18 138/79   Wt Readings from Last 3 Encounters:  10/17/19 (!) 279 lb (126.6 kg)  05/05/19 276 lb 3.2 oz (125.3 kg)  11/08/18  276 lb (125.2 kg)   BMI Readings from Last 1 Encounters:  10/17/19 43.70 kg/m    *Unable to obtain current vital signs, weight, and BMI due to telephone visit type  Hearing/Vision  . [place] did not seem to have difficulty with hearing/understanding during the telephone conversation . Reports that he has not had a formal eye exam by an eye care professional within the past year . Reports that he has not had a formal hearing evaluation within the past year *Unable to fully assess hearing and vision during telephone visit type  Cognitive Function: 6CIT Screen 11/15/2019 11/15/2019 11/08/2018  What Year? - 0 points 0 points  What month? - 0 points 0 points  What time? - 0 points 0 points  Count back from 20 4 points 0 points 2 points  Months in reverse - 0 points 2 points  Repeat phrase - 0 points 2 points  Total Score - 0 6   (Normal:0-7, Significant for Dysfunction: >8)  Normal Cognitive Function Screening: Yes   Immunization & Health Maintenance Record Immunization History  Administered Date(s) Administered  . Influenza Split 01/05/2013  . Influenza, High Dose Seasonal PF 01/21/2014, 01/01/2015, 01/14/2016, 01/13/2017, 01/16/2018, 12/19/2018  . Influenza,inj,quad, With Preservative 12/22/2016, 01/13/2018  . Influenza-Unspecified 01/21/2014, 01/01/2015  . Pneumococcal Conjugate-13 10/24/2015  . Pneumococcal Polysaccharide-23 07/08/2017  . Tdap 07/08/2017    Health Maintenance  Topic Date Due  . Hepatitis C Screening  Never done  . COVID-19 Vaccine (1) Never done  . INFLUENZA VACCINE  10/23/2019  . TETANUS/TDAP  07/09/2027  . PNA vac Low Risk Adult  Completed       Assessment  This is a routine wellness examination for [employer]  Health Maintenance: Due or Overdue Health Maintenance Due  Topic Date Due  . Hepatitis C Screening  Never done  . COVID-19 Vaccine (1) Never done  . INFLUENZA VACCINE  10/23/2019    Marcus Carr does not need a referral  for Community Assistance:  Care Management:   no Social Work:    no Prescription Assistance:  no Nutrition/Diabetes Education:  no   Plan:  Personalized Goals Goals Addressed              This Visit's Progress     Patient Stated   .  Weight (lb) < 200 lb (90.7 kg) (pt-stated)        Would like to loose 40 pounds       Other   .  Exercise 3x per week (30 min per time)   Not on track     Patient is a member at the [facility]- increase gym visits to 3 times per week for at 20-30 minutes on the stationary bike.      Personalized Health Maintenance & Screening Recommendations   Lung Cancer Screening Recommended: no (Low Dose CT Chest recommended if Age 79-80 years, 30 pack-year currently smoking OR have quit w/in past 15 years) Hepatitis C Screening recommended: no HIV Screening recommended: no  Advanced Directives: Written information was not prepared per patient's request.  Referrals & Orders No orders of the defined types were placed in this encounter.   Follow-up Plan . Follow-up with Marcus Balloon, FNP as planned . Schedule 11/21/2019    I have personally reviewed and noted the following in the patient's chart:   . Medical and social history . Use of alcohol, tobacco or illicit drugs  . Current medications and supplements . Functional ability and status . Nutritional status . Physical activity . Advanced directives . List of other physicians . Hospitalizations, surgeries, and ER visits in previous 12 months . Vitals . Screenings to include cognitive, depression, and falls . Referrals and appointments  In addition, I have reviewed and discussed with Marcus Carr certain preventive protocols, quality metrics, and best practice recommendations. A written personalized care plan for preventive services as well as general preventive health recommendations is available and can be mailed to the patient at his request.      Maud Deed  [facility]  1/75/1025

## 2019-11-21 ENCOUNTER — Other Ambulatory Visit: Payer: Self-pay

## 2019-11-21 ENCOUNTER — Encounter: Payer: Self-pay | Admitting: Family

## 2019-11-21 ENCOUNTER — Ambulatory Visit (INDEPENDENT_AMBULATORY_CARE_PROVIDER_SITE_OTHER): Payer: Medicare Other | Admitting: Family

## 2019-11-21 VITALS — BP 148/78 | HR 67 | Temp 97.6°F | Ht 67.0 in | Wt 276.2 lb

## 2019-11-21 DIAGNOSIS — N183 Chronic kidney disease, stage 3 unspecified: Secondary | ICD-10-CM | POA: Diagnosis not present

## 2019-11-21 DIAGNOSIS — I10 Essential (primary) hypertension: Secondary | ICD-10-CM | POA: Diagnosis not present

## 2019-11-21 DIAGNOSIS — M549 Dorsalgia, unspecified: Secondary | ICD-10-CM

## 2019-11-21 DIAGNOSIS — E785 Hyperlipidemia, unspecified: Secondary | ICD-10-CM

## 2019-11-21 DIAGNOSIS — Z87891 Personal history of nicotine dependence: Secondary | ICD-10-CM

## 2019-11-21 DIAGNOSIS — G8929 Other chronic pain: Secondary | ICD-10-CM | POA: Diagnosis not present

## 2019-11-21 DIAGNOSIS — F419 Anxiety disorder, unspecified: Secondary | ICD-10-CM

## 2019-11-21 MED ORDER — VERAPAMIL HCL ER 180 MG PO TBCR: 180.0000 mg | EXTENDED_RELEASE_TABLET | Freq: Every day | ORAL | 2 refills | Status: DC

## 2019-11-21 NOTE — Patient Instructions (Signed)
Health Maintenance After Age 78 After age 78, you are at a higher risk for certain long-term diseases and infections as well as injuries from falls. Falls are a major cause of broken bones and head injuries in people who are older than age 78. Getting regular preventive care can help to keep you healthy and well. Preventive care includes getting regular testing and making lifestyle changes as recommended by your health care provider. Talk with your health care provider about:  Which screenings and tests you should have. A screening is a test that checks for a disease when you have no symptoms.  A diet and exercise plan that is right for you. What should I know about screenings and tests to prevent falls? Screening and testing are the best ways to find a health problem early. Early diagnosis and treatment give you the best chance of managing medical conditions that are common after age 78. Certain conditions and lifestyle choices may make you more likely to have a fall. Your health care provider may recommend:  Regular vision checks. Poor vision and conditions such as cataracts can make you more likely to have a fall. If you wear glasses, make sure to get your prescription updated if your vision changes.  Medicine review. Work with your health care provider to regularly review all of the medicines you are taking, including over-the-counter medicines. Ask your health care provider about any side effects that may make you more likely to have a fall. Tell your health care provider if any medicines that you take make you feel dizzy or sleepy.  Osteoporosis screening. Osteoporosis is a condition that causes the bones to get weaker. This can make the bones weak and cause them to break more easily.  Blood pressure screening. Blood pressure changes and medicines to control blood pressure can make you feel dizzy.  Strength and balance checks. Your health care provider may recommend certain tests to check your  strength and balance while standing, walking, or changing positions.  Foot health exam. Foot pain and numbness, as well as not wearing proper footwear, can make you more likely to have a fall.  Depression screening. You may be more likely to have a fall if you have a fear of falling, feel emotionally low, or feel unable to do activities that you used to do.  Alcohol use screening. Using too much alcohol can affect your balance and may make you more likely to have a fall. What actions can I take to lower my risk of falls? General instructions  Talk with your health care provider about your risks for falling. Tell your health care provider if: ? You fall. Be sure to tell your health care provider about all falls, even ones that seem minor. ? You feel dizzy, sleepy, or off-balance.  Take over-the-counter and prescription medicines only as told by your health care provider. These include any supplements.  Eat a healthy diet and maintain a healthy weight. A healthy diet includes low-fat dairy products, low-fat (lean) meats, and fiber from whole grains, beans, and lots of fruits and vegetables. Home safety  Remove any tripping hazards, such as rugs, cords, and clutter.  Install safety equipment such as grab bars in bathrooms and safety rails on stairs.  Keep rooms and walkways well-lit. Activity   Follow a regular exercise program to stay fit. This will help you maintain your balance. Ask your health care provider what types of exercise are appropriate for you.  If you need a cane or   walker, use it as recommended by your health care provider.  Wear supportive shoes that have nonskid soles. Lifestyle  Do not drink alcohol if your health care provider tells you not to drink.  If you drink alcohol, limit how much you have: ? 0-1 drink a day for women. ? 0-2 drinks a day for men.  Be aware of how much alcohol is in your drink. In the [place] one drink equals one typical bottle of beer (12  oz), one-half glass of wine (5 oz), or one shot of hard liquor (1 oz).  Do not use any products that contain nicotine or tobacco, such as cigarettes and e-cigarettes. If you need help quitting, ask your health care provider. Summary  Having a healthy lifestyle and getting preventive care can help to protect your health and wellness after age 78.  Screening and testing are the best way to find a health problem early and help you avoid having a fall. Early diagnosis and treatment give you the best chance for managing medical conditions that are more common for people who are older than age 78.  Falls are a major cause of broken bones and head injuries in people who are older than age 78. Take precautions to prevent a fall at home.  Work with your health care provider to learn what changes you can make to improve your health and wellness and to prevent falls. This information is not intended to replace advice given to you by your health care provider. Make sure you discuss any questions you have with your health care provider. Document Revised: 07/01/2018 Document Reviewed: 01/21/2017 Elsevier Patient Education  2020 Elsevier Inc[employer]  

## 2019-11-21 NOTE — Progress Notes (Signed)
Subjective:    Patient ID: Marcus Carr, male    DOB: 1941/01/20, 79 y.o.   MRN: [id number]  Chief Complaint  Patient presents with  . Medical Management of Chronic Issues    no complaints. Patient fasting.  Marland Kitchen Hypertension   Pt presents to the office today for chronic follow up. PT has history of osteomyelitis of mandible, doing stable at this time. Pt is followed by Pain Management every 3 months for chronic back pain.   Pt has been vaccinated for COVID.  Hypertension This is a chronic problem. The current episode started more than 1 year ago. The problem has been waxing and waning since onset. The problem is uncontrolled. Associated symptoms include anxiety and malaise/fatigue. Pertinent negatives include no peripheral edema or shortness of breath. Risk factors for coronary artery disease include obesity, male gender and sedentary lifestyle. The current treatment provides moderate improvement.  Hyperlipidemia This is a chronic problem. The current episode started more than 1 year ago. The problem is controlled. Recent lipid tests were reviewed and are normal. Exacerbating diseases include obesity. Pertinent negatives include no shortness of breath. Current antihyperlipidemic treatment includes statins. The current treatment provides moderate improvement of lipids. Risk factors for coronary artery disease include male sex, hypertension and a sedentary lifestyle.  Anxiety Presents for follow-up visit. Symptoms include depressed mood, irritability and nervous/anxious behavior. Patient reports no shortness of breath. Symptoms occur occasionally. The severity of symptoms is moderate. The quality of sleep is good.    Back Pain This is a chronic problem. The current episode started in the past 7 days. The problem occurs intermittently. The pain is present in the lumbar spine. The pain is at a severity of 3/10. The pain is moderate. Pertinent negatives include no paresis or paresthesias.       Review of Systems  Constitutional: Positive for irritability and malaise/fatigue.  Respiratory: Negative for shortness of breath.   Musculoskeletal: Positive for back pain.  Neurological: Negative for paresthesias.  Psychiatric/Behavioral: The patient is nervous/anxious.   All other systems reviewed and are negative.      Objective:   Physical Exam Vitals reviewed.  Constitutional:      General: He is not in acute distress.    Appearance: He is well-developed. He is obese.  HENT:     Head: Normocephalic.     Right Ear: Tympanic membrane normal.     Left Ear: Tympanic membrane normal.  Eyes:     General:        Right eye: No discharge.        Left eye: No discharge.     Pupils: Pupils are equal, round, and reactive to light.  Neck:     Thyroid: No thyromegaly.  Cardiovascular:     Rate and Rhythm: Normal rate and regular rhythm.     Heart sounds: Normal heart sounds. No murmur heard.   Pulmonary:     Effort: Pulmonary effort is normal. No respiratory distress.     Breath sounds: Normal breath sounds. No wheezing.  Abdominal:     General: Bowel sounds are normal. There is no distension.     Palpations: Abdomen is soft.     Tenderness: There is no abdominal tenderness.  Musculoskeletal:        General: No tenderness. Normal range of motion.     Cervical back: Normal range of motion and neck supple.  Skin:    General: Skin is warm and dry.     Findings: No  erythema or rash.  Neurological:     Mental Status: He is alert and oriented to person, place, and time.     Cranial Nerves: No cranial nerve deficit.     Deep Tendon Reflexes: Reflexes are normal and symmetric.  Psychiatric:        Behavior: Behavior normal.        Thought Content: Thought content normal.        Judgment: Judgment normal.        BP (!) 148/78   Pulse 67   Temp 97.6 F (36.4 C) (Temporal)   Ht '5\' 7"'  (1.702 m)   Wt 276 lb 3.2 oz (125.3 kg)   SpO2 95%   BMI 43.26 kg/m    Assessment & Plan:  [name] comes in today with chief complaint of Medical Management of Chronic Issues (no complaints. Patient fasting.) and Hypertension   Diagnosis and orders addressed:  1. Essential hypertension - verapamil (CALAN-SR) 180 MG CR tablet; Take 1 tablet (180 mg total) by mouth daily.  Dispense: 90 tablet; Refill: 2 - CMP14+EGFR - CBC with Differential/Platelet - Hepatitis C antibody  2. Stage 3 chronic kidney disease, unspecified whether stage 3a or 3b CKD - CMP14+EGFR - CBC with Differential/Platelet  3. Anxiety - CMP14+EGFR - CBC with Differential/Platelet  4. Chronic bilateral back pain, unspecified back location - CMP14+EGFR - CBC with Differential/Platelet  5. Hx of smoking - CMP14+EGFR - CBC with Differential/Platelet  6. Hyperlipidemia, unspecified hyperlipidemia type - CMP14+EGFR - CBC with Differential/Platelet - Lipid panel  7. Morbid obesity ([place]) - CMP14+EGFR - CBC with Differential/Platelet   Labs pending Health Maintenance reviewed Diet and exercise encouraged  Follow up plan: 6 months    [name], FNP

## 2019-11-22 ENCOUNTER — Other Ambulatory Visit: Payer: Self-pay | Admitting: Family

## 2019-11-22 LAB — LIPID PANEL
Chol/HDL Ratio: 4.5 ratio (ref 0.0–5.0)
Cholesterol, Total: 125 mg/dL (ref 100–199)
HDL: 28 mg/dL — ABNORMAL LOW (ref >39)
LDL Chol Calc (NIH): 70 mg/dL (ref 0–99)
Triglycerides: 152 mg/dL — ABNORMAL HIGH (ref 0–149)
VLDL Cholesterol Cal: 27 mg/dL (ref 5–40)

## 2019-11-22 LAB — CMP14+EGFR
ALT: 17 IU/L (ref 0–44)
AST: 22 IU/L (ref 0–40)
Albumin/Globulin Ratio: 1.6 (ref 1.2–2.2)
Albumin: 4.4 g/dL (ref 3.7–4.7)
Alkaline Phosphatase: 86 IU/L (ref 48–121)
BUN/Creatinine Ratio: 19 (ref 10–24)
BUN: 30 mg/dL — ABNORMAL HIGH (ref 8–27)
Bilirubin Total: 0.5 mg/dL (ref 0.0–1.2)
CO2: 26 mmol/L (ref 20–29)
Calcium: 9.3 mg/dL (ref 8.6–10.2)
Chloride: 102 mmol/L (ref 96–106)
Creatinine, Ser: 1.61 mg/dL — ABNORMAL HIGH (ref 0.76–1.27)
GFR calc Af Amer: 47 mL/min/{1.73_m2} — ABNORMAL LOW (ref >59)
GFR calc non Af Amer: 41 mL/min/{1.73_m2} — ABNORMAL LOW (ref >59)
Globulin, Total: 2.7 g/dL (ref 1.5–4.5)
Glucose: 93 mg/dL (ref 65–99)
Potassium: 4.6 mmol/L (ref 3.5–5.2)
Sodium: 142 mmol/L (ref 134–144)
Total Protein: 7.1 g/dL (ref 6.0–8.5)

## 2019-11-22 LAB — CBC WITH DIFFERENTIAL/PLATELET
Basophils Absolute: 0 10*3/uL (ref 0.0–0.2)
Basos: 0 %
EOS (ABSOLUTE): 0.1 10*3/uL (ref 0.0–0.4)
Eos: 3 %
Hematocrit: 40.9 % (ref 37.5–51.0)
Hemoglobin: 13.7 g/dL (ref 13.0–17.7)
Immature Grans (Abs): 0 10*3/uL (ref 0.0–0.1)
Immature Granulocytes: 0 %
Lymphocytes Absolute: 1.4 10*3/uL (ref 0.7–3.1)
Lymphs: 30 %
MCH: 29.7 pg (ref 26.6–33.0)
MCHC: 33.5 g/dL (ref 31.5–35.7)
MCV: 89 fL (ref 79–97)
Monocytes Absolute: 0.5 10*3/uL (ref 0.1–0.9)
Monocytes: 11 %
Neutrophils Absolute: 2.5 10*3/uL (ref 1.4–7.0)
Neutrophils: 56 %
Platelets: 144 10*3/uL — ABNORMAL LOW (ref 150–450)
RBC: 4.61 x10E6/uL (ref 4.14–5.80)
RDW: 14.2 % (ref 11.6–15.4)
WBC: 4.5 10*3/uL (ref 3.4–10.8)

## 2019-11-22 LAB — HEPATITIS C ANTIBODY: Hep C Virus Ab: <0.1 s/co ratio (ref 0.0–0.9)

## 2019-11-30 ENCOUNTER — Ambulatory Visit: Payer: Medicare Other | Admitting: *Deleted

## 2019-11-30 ENCOUNTER — Other Ambulatory Visit: Payer: Self-pay | Admitting: Family

## 2019-11-30 DIAGNOSIS — N183 Chronic kidney disease, stage 3 unspecified: Secondary | ICD-10-CM

## 2019-11-30 DIAGNOSIS — F419 Anxiety disorder, unspecified: Secondary | ICD-10-CM

## 2019-11-30 DIAGNOSIS — I10 Essential (primary) hypertension: Secondary | ICD-10-CM

## 2019-11-30 NOTE — Chronic Care Management (AMB) (Addendum)
Chronic Care Management   Initial Visit Note  11/30/2019 Name: Marcus Carr MRN: [id number] DOB: [birth year]  Referred by: Sharion Balloon, FNP Reason for referral : Chronic Care Management (Initial Visit)   Marcus Carr is a 79 y.o. year old male who is a primary care patient of Sharion Balloon, FNP. The CCM team was consulted for assistance with chronic disease management and care coordination needs related to HTN, CKD, HLD, anxiety.  Review of patient status, including review of consultants reports, relevant laboratory and other test results, and collaboration with appropriate care team members and the patient's provider was performed as part of comprehensive patient evaluation and provision of chronic care management services.    Subjective: I spoke with Marcus Carr wife, Marcus Carr, by telephone today. Marcus Marcus Carr was asleep. We discussed management of his chronic medical conditions and a follow-up telephone call to Marcus Marcus Carr has been scheduled.   SDOH (Social Determinants of Health) assessments performed: Yes See Care Plan activities for detailed interventions related to SDOH      Objective: Outpatient Encounter Medications as of 11/30/2019  Medication Sig Note   albuterol (VENTOLIN HFA) 108 (90 Base) MCG/ACT inhaler TAKE 2 PUFFS BY MOUTH EVERY 6 HOURS AS NEEDED FOR WHEEZE OR SHORTNESS OF BREATH    aspirin 325 MG tablet Take 325 mg by mouth daily.    atorvastatin (LIPITOR) 20 MG tablet Take 1 tablet (20 mg total) by mouth daily.    cholecalciferol (VITAMIN D) 400 UNITS TABS tablet Take 800 Units by mouth daily.    citalopram (CELEXA) 20 MG tablet TAKE 1 TABLET BY MOUTH EVERY DAY    diclofenac sodium (VOLTAREN) 1 % GEL Apply 4 g topically 4 (four) times daily. To left knee for pain    fish oil-omega-3 fatty acids 1000 MG capsule Take 1 g by mouth 2 (two) times daily.     hydrochlorothiazide (MICROZIDE) 12.5 MG capsule TAKE 1 CAPSULE BY MOUTH DAILY. (NEEDS TO BE SEEN BEFORE NEXT  REFILL)    NAPROXEN PO Take 500 mg by mouth 2 (two) times a day.    oxyCODONE-acetaminophen (PERCOCET) 10-325 MG per tablet Take 1 tablet by mouth every 12 (twelve) hours as needed. 09/09/2018: Dr. Nelva Bush   Specialty Vitamins Products (MAGNESIUM, AMINO ACID CHELATE,) 133 MG tablet Take 1 tablet by mouth daily.    verapamil (CALAN-SR) 180 MG CR tablet Take 1 tablet (180 mg total) by mouth daily.    No facility-administered encounter medications on file as of 11/30/2019.    BP Readings from Last 3 Encounters:  11/21/19 (!) 148/78  10/17/19 (!) 132/85  05/05/19 (!) 153/86   Lab Results  Component Value Date   CHOL 125 11/21/2019   HDL 28 (L) 11/21/2019   LDLCALC 70 11/21/2019   TRIG 152 (H) 11/21/2019   CHOLHDL 4.5 11/21/2019     RN Care Plan: Goals Addressed             This Visit's Progress    Chronic Disease Management Needs       CARE PLAN ENTRY (see longtitudinal plan of care for additional care plan information)  Current Barriers:  Chronic Disease Management support, education, and care coordination needs related to HTN, CKD, HLD, anxiety  Clinical Goal(s) related to HTN, CKD, HLD, anxiety:  Over the next 60 days, patient will:  Work with the care management team to address educational, disease management, and care coordination needs  Begin or continue self health monitoring activities as directed today Measure  and record blood pressure 4 times per week Call provider office for new or worsened signs and symptoms Blood pressure findings outside established parameters Call care management team with questions or concerns Verbalize basic understanding of patient centered plan of care established today  Interventions related to HTN, CKD, HLD, anxiety:  Evaluation of current treatment plans and patient's adherence to plan as established by provider Assessed patient understanding of disease states Assessed patient's education and care coordination needs Provided disease  specific education to patient  Collaborated with appropriate clinical care team members regarding patient needs Chart reviewed including recent office notes and lab results Talked with wife by telephone. Patient was sleeping.  Reviewed upcoming appointments Reviewed and discussed medications and recent changes Encouraged to check blood pressure 3-4 times a week and to call PCP with any readings outside of recommended range Discussed mobility and physical activity and ability to perform ADLs Provided with RN care manager contact number and encouraged to reach out as needed  Patient Self Care Activities related to HTN, CKD, HLD, anxiety:  Patient is unable to independently self-manage chronic health conditions  Initial goal documentation         Plan:   The care management team will reach out to the patient again over the next 60 days.   Chong Sicilian, BSN, RN-BC Embedded Chronic Care Manager [facility] [place] [facility] Direct Dial: [phone]     I have reviewed the CCM documentation and agree with the written assessment and plan of care.  [name], FNP

## 2019-11-30 NOTE — Patient Instructions (Signed)
Visit Information  Goals Addressed            This Visit's Progress    Chronic Disease Management Needs       CARE PLAN ENTRY (see longtitudinal plan of care for additional care plan information)  Current Barriers:   Chronic Disease Management support, education, and care coordination needs related to HTN, CKD, HLD, anxiety  Clinical Goal(s) related to HTN, CKD, HLD, anxiety:  Over the next 60 days, patient will:   Work with the care management team to address educational, disease management, and care coordination needs   Begin or continue self health monitoring activities as directed today Measure and record blood pressure 4 times per week  Call provider office for new or worsened signs and symptoms Blood pressure findings outside established parameters  Call care management team with questions or concerns  Verbalize basic understanding of patient centered plan of care established today  Interventions related to HTN, CKD, HLD, anxiety:   Evaluation of current treatment plans and patient's adherence to plan as established by provider  Assessed patient understanding of disease states  Assessed patient's education and care coordination needs  Provided disease specific education to patient   Collaborated with appropriate clinical care team members regarding patient needs  Chart reviewed including recent office notes and lab results  Talked with wife by telephone. Patient was sleeping.   Reviewed upcoming appointments  Reviewed and discussed medications and recent changes  Encouraged to check blood pressure 3-4 times a week and to call PCP with any readings outside of recommended range  Discussed mobility and physical activity and ability to perform ADLs  Provided with RN care manager contact number and encouraged to reach out as needed  Patient Self Care Activities related to HTN, CKD, HLD, anxiety:   Patient is unable to independently self-manage chronic health  conditions  Initial goal documentation        [name] was given information about Chronic Care Management services today including:  1. CCM service includes personalized support from designated clinical staff supervised by his physician, including individualized plan of care and coordination with other care providers 2. 24/7 contact phone numbers for assistance for urgent and routine care needs. 3. Service will only be billed when office clinical staff spend 20 minutes or more in a month to coordinate care. 4. Only one practitioner may furnish and bill the service in a calendar month. 5. The patient may stop CCM services at any time (effective at the end of the month) by phone call to the office staff. 6. The patient will be responsible for cost sharing (co-pay) of up to 20% of the service fee (after annual deductible is met).  Patient agreed to services and verbal consent obtained.   Patient verbalizes understanding of instructions provided today.   The care management team will reach out to the patient again over the next 60 days.   Chong Sicilian, BSN, RN-BC Embedded Chronic Care Manager [facility] [place] [facility] Direct Dial: [phone]

## 2019-12-06 ENCOUNTER — Ambulatory Visit (INDEPENDENT_AMBULATORY_CARE_PROVIDER_SITE_OTHER): Payer: Medicare Other | Admitting: *Deleted

## 2019-12-06 ENCOUNTER — Other Ambulatory Visit: Payer: Self-pay

## 2019-12-06 VITALS — BP 140/90

## 2019-12-06 DIAGNOSIS — I10 Essential (primary) hypertension: Secondary | ICD-10-CM

## 2019-12-06 NOTE — Progress Notes (Signed)
Patient in today for BP check.   140/90

## 2019-12-21 DIAGNOSIS — M5136 Other intervertebral disc degeneration, lumbar region: Secondary | ICD-10-CM | POA: Diagnosis not present

## 2020-01-11 ENCOUNTER — Telehealth: Payer: Medicare Other | Admitting: *Deleted

## 2020-01-12 ENCOUNTER — Telehealth: Payer: Self-pay | Admitting: *Deleted

## 2020-01-12 NOTE — Telephone Encounter (Addendum)
  Chronic Care Management   Outreach Note  01/11/2020 Name: Marcus Carr MRN: [id number] DOB: 1940-03-25  Referred by: Sharion Balloon, FNP Reason for referral : No chief complaint on file.   An unsuccessful telephone follow-up was attempted today. The patient was referred to the case management team for assistance with care management and care coordination.   Follow Up Plan: The care management team will reach out to the patient again over the next 30 days.   Chong Sicilian, BSN, RN-BC Embedded Chronic Care Manager [facility] [place] [facility] Direct Dial: [phone]

## 2020-01-12 NOTE — Telephone Encounter (Signed)
Pt has been r/s  

## 2020-02-20 ENCOUNTER — Other Ambulatory Visit: Payer: Self-pay | Admitting: Family

## 2020-02-20 DIAGNOSIS — E785 Hyperlipidemia, unspecified: Secondary | ICD-10-CM

## 2020-02-20 DIAGNOSIS — E8881 Metabolic syndrome: Secondary | ICD-10-CM

## 2020-02-21 ENCOUNTER — Ambulatory Visit: Payer: Medicare Other | Admitting: *Deleted

## 2020-02-21 DIAGNOSIS — N183 Chronic kidney disease, stage 3 unspecified: Secondary | ICD-10-CM

## 2020-02-21 DIAGNOSIS — I10 Essential (primary) hypertension: Secondary | ICD-10-CM

## 2020-02-21 DIAGNOSIS — E785 Hyperlipidemia, unspecified: Secondary | ICD-10-CM

## 2020-02-21 NOTE — Chronic Care Management (AMB) (Signed)
  Chronic Care Management   Initial Visit Note  02/21/2020 Name: Marcus Carr MRN: [id number] DOB: May 02, 1940  Referred by: Sharion Balloon, FNP Reason for referral : Chronic Care Management (RN follow up)   Marcus Carr is a 79 y.o. year old male who is a primary care patient of Sharion Balloon, FNP. The CCM team was consulted for assistance with chronic disease management and care coordination needs related to HTN, CKD, HLD, anxiety.  Review of patient status, including review of consultants reports, relevant laboratory and other test results was performed prior to outreach.   I spoke with Marcus Carr by telephone today regarding management of their chronic medical conditions. They do not have any CCM or resource needs and feel that their medical conditions are well managed. They appreciated the outreach, but do not feel that CCM services are needed at this time. They will reach out in the future if a need arises. I encouraged Marcus Carr to check and record his blood pressure 3-4 times a week and as needed. I also reviewed his most recent lab results and discussed his upcoming appt with PCP on 05/21/20.  SDOH (Social Determinants of Health) assessments performed: Yes See Care Plan activities for detailed interventions related to SDOH     Plan:  . The patient has been provided with contact information for the care management team and has been advised to call if they would like to re-enroll in the CCM program to receive care management and care coordination services.  . CCM enrollment status changed to "previously enrolled" as per patient request on 02/21/2020  to discontinue enrollment. Case closed to case management services in primary care home.  . Patient was provided with general disease management education and encouraged to follow-up with their PCP and specialists as recommended.   Chong Sicilian, BSN, RN-BC Embedded Chronic Care Manager [facility] [place] [facility] Direct Dial: [phone]

## 2020-02-21 NOTE — Patient Instructions (Signed)
Plan:  . The patient has been provided with contact information for the care management team and has been advised to call if they would like to re-enroll in the CCM program to receive care management and care coordination services.  . CCM enrollment status changed to "previously enrolled" as per patient request on 02/21/2020  to discontinue enrollment. Case closed to case management services in primary care home.  . Patient was provided with general disease management education and encouraged to follow-up with their PCP and specialists as recommended.   Chong Sicilian, BSN, RN-BC Embedded Chronic Care Manager [facility] [place] [facility] Direct Dial: [phone]

## 2020-02-27 DIAGNOSIS — Z20822 Contact with and (suspected) exposure to covid-19: Secondary | ICD-10-CM | POA: Diagnosis not present

## 2020-03-01 ENCOUNTER — Other Ambulatory Visit: Payer: Self-pay | Admitting: Family

## 2020-03-01 DIAGNOSIS — F419 Anxiety disorder, unspecified: Secondary | ICD-10-CM

## 2020-03-19 ENCOUNTER — Ambulatory Visit: Payer: Medicare Other | Admitting: Family

## 2020-03-20 ENCOUNTER — Encounter: Payer: Self-pay | Admitting: Family

## 2020-03-27 ENCOUNTER — Other Ambulatory Visit: Payer: Self-pay

## 2020-03-27 ENCOUNTER — Ambulatory Visit (INDEPENDENT_AMBULATORY_CARE_PROVIDER_SITE_OTHER): Payer: Medicare Other

## 2020-03-27 ENCOUNTER — Other Ambulatory Visit: Payer: Self-pay | Admitting: Family

## 2020-03-27 ENCOUNTER — Ambulatory Visit (INDEPENDENT_AMBULATORY_CARE_PROVIDER_SITE_OTHER): Payer: Medicare Other | Admitting: Family Medicine

## 2020-03-27 VITALS — BP 159/87 | HR 85 | Temp 97.3°F | Ht 67.0 in | Wt 275.0 lb

## 2020-03-27 DIAGNOSIS — M25431 Effusion, right wrist: Secondary | ICD-10-CM | POA: Diagnosis not present

## 2020-03-27 DIAGNOSIS — M7989 Other specified soft tissue disorders: Secondary | ICD-10-CM | POA: Diagnosis not present

## 2020-03-27 DIAGNOSIS — F419 Anxiety disorder, unspecified: Secondary | ICD-10-CM

## 2020-03-27 MED ORDER — PREDNISONE 20 MG PO TABS: 20.0000 mg | ORAL_TABLET | Freq: Every day | ORAL | 0 refills | Status: AC

## 2020-03-27 NOTE — Progress Notes (Signed)
Subjective: CC: Swollen hand PCP: Junie Spencer, FNP VZD:GLOV Marcus Carr is a 80 y.o. male presenting to clinic today for:  1.  Swollen hand Patient reports acute onset of swollen hand in the right about 1 week ago.  No preceding injury.  He notes that swelling has been unchanged over the entire course of the last week.  Has been using topical Voltaren gel and his home opioid for pain relief.  He denies any sensory changes or weakness.  The swelling seems to be most predominant along the wrist.   ROS: Per HPI  Allergies  Allergen Reactions  . Penicillins Rash   Past Medical History:  Diagnosis Date  . Abscess, jaw   . Anxiety   . Back pain    chronic back pain treated by Dr. Ethelene Hal  . Chronic kidney disease   . Chronic mastoiditis of both sides 11/07/2014  . Cigarette smoker 11/07/2014  . Gout   . Hyperlipidemia   . Hypertension   . Morbid obesity (HCC) 11/07/2014  . Osteomyelitis of mandible 11/07/2014  . Poor dentition 11/07/2014  . Thyroid nodule 11/07/2014    Current Outpatient Medications:  .  albuterol (VENTOLIN HFA) 108 (90 Base) MCG/ACT inhaler, TAKE 2 PUFFS BY MOUTH EVERY 6 HOURS AS NEEDED FOR WHEEZE OR SHORTNESS OF BREATH, Disp: 8 g, Rfl: 1 .  aspirin 325 MG tablet, Take 325 mg by mouth daily., Disp: , Rfl:  .  atorvastatin (LIPITOR) 20 MG tablet, TAKE 1 TABLET BY MOUTH EVERY DAY, Disp: 90 tablet, Rfl: 0 .  cholecalciferol (VITAMIN D) 400 UNITS TABS tablet, Take 800 Units by mouth daily., Disp: , Rfl:  .  citalopram (CELEXA) 20 MG tablet, TAKE 1 TABLET BY MOUTH EVERY DAY, Disp: 90 tablet, Rfl: 0 .  diclofenac sodium (VOLTAREN) 1 % GEL, Apply 4 g topically 4 (four) times daily. To left knee for pain, Disp: 200 g, Rfl: 1 .  fish oil-omega-3 fatty acids 1000 MG capsule, Take 1 g by mouth 2 (two) times daily. , Disp: , Rfl:  .  hydrochlorothiazide (MICROZIDE) 12.5 MG capsule, TAKE 1 CAPSULE BY MOUTH DAILY. (NEEDS TO BE SEEN BEFORE NEXT REFILL) (Patient not taking:  Reported on 03/27/2020), Disp: 90 capsule, Rfl: 1 .  NAPROXEN PO, Take 500 mg by mouth 2 (two) times a day., Disp: , Rfl:  .  oxyCODONE-acetaminophen (PERCOCET) 10-325 MG per tablet, Take 1 tablet by mouth every 12 (twelve) hours as needed., Disp: 90 tablet, Rfl: 0 .  Specialty Vitamins Products (MAGNESIUM, AMINO ACID CHELATE,) 133 MG tablet, Take 1 tablet by mouth daily., Disp: , Rfl:  .  verapamil (CALAN-SR) 180 MG CR tablet, Take 1 tablet (180 mg total) by mouth daily., Disp: 90 tablet, Rfl: 2 Social History   Socioeconomic History  . Marital status: Married    Spouse name: Hollis  . Number of children: 2  . Years of education: 8  . Highest education level: 8th grade  Occupational History  . Occupation: Retired  Tobacco Use  . Smoking status: Former Smoker    Packs/day: 1.50    Years: 50.00    Pack years: 75.00    Types: Cigarettes    Quit date: 10/24/2014    Years since quitting: 5.4  . Smokeless tobacco: Never Used  Vaping Use  . Vaping Use: Never used  Substance and Sexual Activity  . Alcohol use: No    Alcohol/week: 0.0 standard drinks  . Drug use: No  . Sexual activity: Not on  file  Other Topics Concern  . Not on file  Social History Narrative   Lives wife.  Two daughters.     Social Determinants of Health   Financial Resource Strain: Low Risk   . Difficulty of Paying Living Expenses: Not hard at all  Food Insecurity: No Food Insecurity  . Worried About Charity fundraiser in the Last Year: Never true  . Ran Out of Food in the Last Year: Never true  Transportation Needs: No Transportation Needs  . Lack of Transportation (Medical): No  . Lack of Transportation (Non-Medical): No  Physical Activity: Not on file  Stress: Not on file  Social Connections: Not on file  Intimate Partner Violence: Not on file   Family History  Problem Relation Age of Onset  . Cancer Mother        esophageal  . Heart attack Father 65    Objective: Office vital signs reviewed. BP  (!) 159/87   Pulse 85   Temp (!) 97.3 F (36.3 C) (Temporal)   Ht 5\' 7"  (1.702 m)   Wt 275 lb (124.7 kg)   SpO2 97%   BMI 43.07 kg/m   Physical Examination:  General: Awake, alert, well nourished, No acute distress Extremities: warm, well perfused, No edema, cyanosis or clubbing; +2 pulses bilaterally MSK:   Right wrist: Decreased active range of motion all planes secondary to swelling.  He has a palpable area of soft tissue swelling this approximately quarter in size along the ventral aspect of the wrist along the scaphoid region.  This is tender to palpation.  No palpable fluctuance however. Skin: dry; intact; no rashes or lesions; no erythema Neuro: Light touch sensation grossly intact Assessment/ Plan: 80 y.o. male   Swollen wrist, right - Plan: DG Wrist Complete Right  Personal review of the x-ray did not demonstrate any appreciable dislocations or abnormalities.  Given degree of swelling and tenderness someone to place him on a little bit of prednisone since he has renal impairment.  Apply ice. I've asked that he follow-up in the next week for recheck.  If symptoms do not resolve, may need to consider referral to orthopedics.  I wonder if this swelling is due to underlying arthritis.  Could consider checking a uric acid level.  Nothing on exam to suggest cellulitis.  Orders Placed This Encounter  Procedures  . DG Wrist Complete Right    Standing Status:   Future    Standing Expiration Date:   03/27/2021    Order Specific Question:   Reason for Exam (SYMPTOM  OR DIAGNOSIS REQUIRED)    Answer:   swelling most predominant over ventral aspect of scaphoid; no injury    Order Specific Question:   Preferred imaging location?    Answer:   Internal   No orders of the defined types were placed in this encounter.    Janora Norlander, DO [place] [phone]

## 2020-04-05 ENCOUNTER — Other Ambulatory Visit: Payer: Self-pay

## 2020-04-05 ENCOUNTER — Encounter: Payer: Self-pay | Admitting: Family

## 2020-04-05 ENCOUNTER — Ambulatory Visit (INDEPENDENT_AMBULATORY_CARE_PROVIDER_SITE_OTHER): Payer: Medicare Other | Admitting: Family

## 2020-04-05 VITALS — BP 149/85 | HR 88 | Temp 96.7°F | Ht 67.0 in | Wt 276.0 lb

## 2020-04-05 DIAGNOSIS — S63501D Unspecified sprain of right wrist, subsequent encounter: Secondary | ICD-10-CM | POA: Diagnosis not present

## 2020-04-05 DIAGNOSIS — S63501A Unspecified sprain of right wrist, initial encounter: Secondary | ICD-10-CM

## 2020-04-05 NOTE — Progress Notes (Signed)
Subjective:    Patient ID: Marcus Carr, male    DOB: 1941/01/10, 80 y.o.   MRN: [id number]  Chief Complaint  Patient presents with  . Follow-up    Right wrist follow up from dr.G   PT presents to the office today recheck right wrist after injury. He lifted a 40 pack of water and twisted his wrist.   He was seen in the office on 03/27/20  and given prednisone 20 mg for 4 days.   He reports his pain and swelling has improved. He states his pain is now a 2 out 10. He states he had a knot there, but that has decreased.  Wrist Pain  The pain is present in the right wrist. This is a new problem. The current episode started 1 to 4 weeks ago. There has been a history of trauma. The problem occurs intermittently. The quality of the pain is described as aching. The pain is at a severity of 3/10. The pain is mild. Pertinent negatives include no numbness or stiffness. He has tried rest (prednsione ) for the symptoms. The treatment provided moderate relief.      Review of Systems  Musculoskeletal: Negative for stiffness.  Neurological: Negative for numbness.  All other systems reviewed and are negative.      Objective:   Physical Exam Vitals reviewed.  Constitutional:      General: He is not in acute distress.    Appearance: He is well-developed and well-nourished.  HENT:     Head: Normocephalic.     Mouth/Throat:     Mouth: Oropharynx is clear and moist.  Eyes:     General:        Right eye: No discharge.        Left eye: No discharge.     Pupils: Pupils are equal, round, and reactive to light.  Neck:     Thyroid: No thyromegaly.  Cardiovascular:     Rate and Rhythm: Normal rate and regular rhythm.     Pulses: Intact distal pulses.     Heart sounds: Normal heart sounds. No murmur heard.   Pulmonary:     Effort: Pulmonary effort is normal. No respiratory distress.     Breath sounds: Normal breath sounds. No wheezing.  Abdominal:     General: Bowel sounds are normal.  There is no distension.     Palpations: Abdomen is soft.     Tenderness: There is no abdominal tenderness.  Musculoskeletal:        General: Tenderness present. No edema.     Cervical back: Normal range of motion and neck supple.     Comments: Mild tenderness in right with flexion and extension  Skin:    General: Skin is warm and dry.     Findings: No erythema or rash.  Neurological:     Mental Status: He is alert and oriented to person, place, and time.     Cranial Nerves: No cranial nerve deficit.     Deep Tendon Reflexes: Reflexes are normal and symmetric.  Psychiatric:        Mood and Affect: Mood and affect normal.        Behavior: Behavior normal.        Thought Content: Thought content normal.        Judgment: Judgment normal.       BP (!) 149/85   Pulse 88   Temp (!) 96.7 F (35.9 C) (Temporal)   Ht 5\' 7"  (1.702 m)  Wt 276 lb (125.2 kg)   BMI 43.23 kg/m      Assessment & Plan:  Marcus Carr comes in today with chief complaint of Follow-up (Right wrist follow up from dr.G)   Diagnosis and orders addressed:  1. Sprain of right wrist, initial encounter Improving  Rest Ice  ROM exercises Avoid heavy lifting Keep chronic follow up   [name], FNP

## 2020-04-05 NOTE — Patient Instructions (Signed)
Wrist Sprain, Adult A wrist sprain is a stretch or tear in the strong tissues that connect the wrist bones to each other. These strong tissues are called ligaments. There are three types of wrist sprains:  Grade 1. The ligament is stretched more than normal. There may be a minor amount of wrist pain.  Grade 2. The ligament is partially torn. You may be able to move your wrist, but not very much. There may be a moderate amount of wrist pain.  Grade 3. The ligament or ligaments are completely torn. You may find it difficult to move your wrist even a little. There may be a significant amount of wrist pain. What are the causes? This condition may be caused by using the wrist too much during sports, exercise, or work. It can also happen due to a fall or during an accident. What increases the risk? You are more likely to develop this condition if:  You had a previous wrist or arm injury.  You have poor wrist strength and flexibility.  You play contact sports, such as football or soccer.  You participate in sports that may result in a fall, such as skateboarding, biking, skiing, or snowboarding.  You do not exercise regularly.  You use exercise equipment that does not fit well. What are the signs or symptoms? Symptoms of this condition include:  Pain in the wrist, arm, or hand.  Swelling or bruised skin near the wrist, hand, or arm. The skin may look yellow or blue.  Stiffness or trouble moving the hand.  Hearing a noise, like a pop or a snap, at the time of injury, or feeling a tear at the time of the injury.  A warm feeling in the skin around the wrist. How is this diagnosed? This condition is diagnosed with a physical exam. Sometimes an X-ray is taken to make sure a bone did not break. You may also have an MRI of your wrist to check for torn ligaments. How is this treated? This condition is treated by resting and applying ice to your wrist. Additional treatment may  include:  Taking medicine for pain and inflammation.  Wearing a splint, brace, or cast for a short period of time to keep your wrist from moving (immobilized).  Doing exercises to strengthen and stretch your wrist.  Having surgery. This may be done if the ligament is completely torn. Follow these instructions at home: If you have a splint or brace:  Wear the splint or brace as told by your health care provider. Remove it only as told by your health care provider.  Loosen it if your fingers tingle, become numb, or turn cold and blue.  Keep it clean.  If the splint or brace is not waterproof: ? Do not let it get wet. ? Cover it with a watertight covering when you take a bath or a shower. If you have a cast:  Do not put pressure on any part of the cast until it is fully hardened. This may take several hours.  Do not stick anything inside the cast to scratch your skin. Doing that increases your risk of infection.  Check the skin around the cast every day. Tell your health care provider about any concerns.  You may put lotion on dry skin around the edges of the cast. Do not put lotion on the skin underneath the cast.  Keep it clean.  If the cast is not waterproof: ? Do not let it get wet. ? Cover it  with a watertight covering when you take a bath or shower. Managing pain, stiffness, and swelling  If directed, put ice on the injured area. To do this: ? If you have a removable splint or brace, remove it as told by your health care provider. ? Put ice in a plastic bag. ? Place a towel between your skin and the bag or between the splint or cast and the bag. ? Leave the ice on for 20 minutes, 2-3 times a day. ? Remove the ice if your skin turns bright red. This is very important. If you cannot feel pain, heat, or cold, you have a greater risk of damage to the area.  Move your fingers often to reduce stiffness and swelling.  Raise (elevate) the injured area above the level of your  heart while you are sitting or lying down.   Activity  Rest your wrist as told by your health care provider. Do not do things that cause pain.  Ask your health care provider when it is safe to drive if you have a splint, brace, or cast on your wrist.  Do exercises as told by your health care provider.  Return to your normal activities as told by your health care provider. Ask your health care provider what activities are safe for you. General instructions  Take over-the-counter and prescription medicines only as told by your health care provider.  Do not use any products that contain nicotine or tobacco, such as cigarettes, e-cigarettes, and chewing tobacco. These can delay healing. If you need help quitting, ask your health care provider.  Keep all follow-up visits. This is important. Contact a health care provider if:  Your pain, bruising, or swelling gets worse.  Your skin becomes red, gets a rash, or has open sores.  Your pain does not get better or it gets worse. Get help right away if:  You have a new or sudden sharp pain in the hand, arm, or wrist.  You have tingling or numbness in your hand.  Your fingers turn white, very red, or cold and blue.  You cannot move your fingers. Summary  A wrist sprain is damage to ligaments in your wrist.  Wrist sprains can range from mild to severe.  Return to your normal activities as told by your health care provider. Ask your health care provider what activities are safe for you.  You may need to wear a splint, brace, or cast for a short period of time. This information is not intended to replace advice given to you by your health care provider. Make sure you discuss any questions you have with your health care provider. Document Revised: 07/18/2019 Document Reviewed: 07/18/2019 Elsevier Patient Education  2021 [employer].

## 2020-04-18 DIAGNOSIS — M5136 Other intervertebral disc degeneration, lumbar region: Secondary | ICD-10-CM | POA: Diagnosis not present

## 2020-05-09 ENCOUNTER — Other Ambulatory Visit: Payer: Self-pay | Admitting: Family

## 2020-05-13 ENCOUNTER — Other Ambulatory Visit: Payer: Self-pay | Admitting: Family

## 2020-05-13 DIAGNOSIS — E785 Hyperlipidemia, unspecified: Secondary | ICD-10-CM

## 2020-05-13 DIAGNOSIS — E8881 Metabolic syndrome: Secondary | ICD-10-CM

## 2020-05-21 ENCOUNTER — Ambulatory Visit: Payer: Medicare Other | Admitting: Family

## 2020-06-07 ENCOUNTER — Encounter: Payer: Self-pay | Admitting: Family

## 2020-06-07 ENCOUNTER — Other Ambulatory Visit: Payer: Self-pay

## 2020-06-07 ENCOUNTER — Ambulatory Visit (INDEPENDENT_AMBULATORY_CARE_PROVIDER_SITE_OTHER): Payer: Medicare Other | Admitting: Family

## 2020-06-07 VITALS — BP 156/83 | HR 83 | Temp 98.0°F | Ht 67.0 in | Wt 282.0 lb

## 2020-06-07 DIAGNOSIS — E785 Hyperlipidemia, unspecified: Secondary | ICD-10-CM | POA: Diagnosis not present

## 2020-06-07 DIAGNOSIS — Z87891 Personal history of nicotine dependence: Secondary | ICD-10-CM | POA: Diagnosis not present

## 2020-06-07 DIAGNOSIS — M549 Dorsalgia, unspecified: Secondary | ICD-10-CM | POA: Diagnosis not present

## 2020-06-07 DIAGNOSIS — I10 Essential (primary) hypertension: Secondary | ICD-10-CM

## 2020-06-07 DIAGNOSIS — G8929 Other chronic pain: Secondary | ICD-10-CM

## 2020-06-07 DIAGNOSIS — N183 Chronic kidney disease, stage 3 unspecified: Secondary | ICD-10-CM | POA: Diagnosis not present

## 2020-06-07 DIAGNOSIS — E8881 Metabolic syndrome: Secondary | ICD-10-CM

## 2020-06-07 DIAGNOSIS — F419 Anxiety disorder, unspecified: Secondary | ICD-10-CM | POA: Diagnosis not present

## 2020-06-07 MED ORDER — CITALOPRAM HYDROBROMIDE 20 MG PO TABS: 20.0000 mg | ORAL_TABLET | Freq: Every day | ORAL | 1 refills | Status: DC

## 2020-06-07 MED ORDER — ATORVASTATIN CALCIUM 20 MG PO TABS: 20.0000 mg | ORAL_TABLET | Freq: Every day | ORAL | 1 refills | Status: DC

## 2020-06-07 MED ORDER — VERAPAMIL HCL ER 180 MG PO TBCR: 180.0000 mg | EXTENDED_RELEASE_TABLET | Freq: Every day | ORAL | 2 refills | Status: DC

## 2020-06-07 NOTE — Progress Notes (Signed)
Subjective:    Patient ID: Marcus Carr, male    DOB: 1940-06-06, 80 y.o.   MRN: [id number]  Chief Complaint  Patient presents with  . Hypertension   Pt presents to the office today for chronic follow up. PT has history of osteomyelitis of mandible, doing stable at this time. Pt is followed by Pain Management every 3 months for chronic back pain.  Hypertension This is a chronic problem. The current episode started more than 1 year ago. The problem has been waxing and waning since onset. The problem is uncontrolled. Associated symptoms include anxiety. Pertinent negatives include no malaise/fatigue, peripheral edema or shortness of breath. Risk factors for coronary artery disease include dyslipidemia, obesity and male gender. The current treatment provides moderate improvement. There is no history of heart failure.  Back Pain This is a chronic problem. The current episode started more than 1 year ago. The problem occurs intermittently. The problem has been waxing and waning since onset. The pain is present in the lumbar spine. The pain is at a severity of 4/10. The pain is mild. The symptoms are aggravated by bending. He has tried bed rest and analgesics for the symptoms. The treatment provided mild relief.  Anxiety Presents for follow-up visit. Symptoms include irritability and restlessness. Patient reports no depressed mood or shortness of breath. Symptoms occur most days. The severity of symptoms is moderate.        Review of Systems  Constitutional: Positive for irritability. Negative for malaise/fatigue.  Respiratory: Negative for shortness of breath.   Musculoskeletal: Positive for back pain.  All other systems reviewed and are negative.      Objective:   Physical Exam Vitals reviewed.  Constitutional:      General: He is not in acute distress.    Appearance: He is well-developed. He is obese.  HENT:     Head: Normocephalic.     Right Ear: Tympanic membrane normal.      Left Ear: Tympanic membrane normal.  Eyes:     General:        Right eye: No discharge.        Left eye: No discharge.     Pupils: Pupils are equal, round, and reactive to light.  Neck:     Thyroid: No thyromegaly.  Cardiovascular:     Rate and Rhythm: Normal rate and regular rhythm.     Heart sounds: Normal heart sounds. No murmur heard.   Pulmonary:     Effort: Pulmonary effort is normal. No respiratory distress.     Breath sounds: Normal breath sounds. No wheezing.  Abdominal:     General: Bowel sounds are normal. There is no distension.     Palpations: Abdomen is soft.     Tenderness: There is no abdominal tenderness.  Musculoskeletal:        General: No tenderness. Normal range of motion.     Cervical back: Normal range of motion and neck supple.  Skin:    General: Skin is warm and dry.     Findings: No erythema or rash.  Neurological:     Mental Status: He is alert and oriented to person, place, and time.     Cranial Nerves: No cranial nerve deficit.     Deep Tendon Reflexes: Reflexes are normal and symmetric.  Psychiatric:        Behavior: Behavior normal.        Thought Content: Thought content normal.        Judgment: Judgment  normal.       BP (!) 156/83   Pulse 83   Temp 98 F (36.7 C) (Temporal)   Ht '5\' 7"'  (1.702 m)   Wt 282 lb (127.9 kg)   BMI 44.17 kg/m      Assessment & Plan:  [name] comes in today with chief complaint of Hypertension   Diagnosis and orders addressed:  1. Anxiety - citalopram (CELEXA) 20 MG tablet; Take 1 tablet (20 mg total) by mouth daily.  Dispense: 90 tablet; Refill: 1 - CMP14+EGFR - CBC with Differential/Platelet  2. Essential hypertension Pt will monitor at home -Dash diet information given -Exercise encouraged - Stress Management  -Continue current meds - verapamil (CALAN-SR) 180 MG CR tablet; Take 1 tablet (180 mg total) by mouth daily.  Dispense: 90 tablet; Refill: 2 - CMP14+EGFR - CBC with  Differential/Platelet  3. Hyperlipidemia, unspecified hyperlipidemia type - atorvastatin (LIPITOR) 20 MG tablet; Take 1 tablet (20 mg total) by mouth daily.  Dispense: 90 tablet; Refill: 1 - CMP14+EGFR - CBC with Differential/Platelet  4. Metabolic syndrome - atorvastatin (LIPITOR) 20 MG tablet; Take 1 tablet (20 mg total) by mouth daily.  Dispense: 90 tablet; Refill: 1 - CMP14+EGFR - CBC with Differential/Platelet  5. Primary hypertension - CMP14+EGFR - CBC with Differential/Platelet  6. Stage 3 chronic kidney disease, unspecified whether stage 3a or 3b CKD (HCC) - CMP14+EGFR - CBC with Differential/Platelet  7. Morbid obesity ([place]) - CMP14+EGFR - CBC with Differential/Platelet  8. Chronic bilateral back pain, unspecified back location - CMP14+EGFR - CBC with Differential/Platelet  9. Smoking hx - CT CHEST LUNG CA SCREEN LOW DOSE W/O CM; Future  10. History of smoking greater than 50 pack years - CT CHEST LUNG CA SCREEN LOW DOSE W/O CM; Future   Labs pending Health Maintenance reviewed Diet and exercise encouraged  Follow up plan: 3 months    [name], FNP

## 2020-06-07 NOTE — Patient Instructions (Signed)

## 2020-06-08 LAB — CMP14+EGFR
ALT: 20 IU/L (ref 0–44)
AST: 23 IU/L (ref 0–40)
Albumin/Globulin Ratio: 1.7 (ref 1.2–2.2)
Albumin: 4.5 g/dL (ref 3.7–4.7)
Alkaline Phosphatase: 117 IU/L (ref 44–121)
BUN/Creatinine Ratio: 13 (ref 10–24)
BUN: 19 mg/dL (ref 8–27)
Bilirubin Total: 0.5 mg/dL (ref 0.0–1.2)
CO2: 26 mmol/L (ref 20–29)
Calcium: 9.3 mg/dL (ref 8.6–10.2)
Chloride: 98 mmol/L (ref 96–106)
Creatinine, Ser: 1.45 mg/dL — ABNORMAL HIGH (ref 0.76–1.27)
Globulin, Total: 2.6 g/dL (ref 1.5–4.5)
Glucose: 90 mg/dL (ref 65–99)
Potassium: 5.1 mmol/L (ref 3.5–5.2)
Sodium: 138 mmol/L (ref 134–144)
Total Protein: 7.1 g/dL (ref 6.0–8.5)
eGFR: 49 mL/min/{1.73_m2} — ABNORMAL LOW (ref >59)

## 2020-06-08 LAB — CBC WITH DIFFERENTIAL/PLATELET
Basophils Absolute: 0 10*3/uL (ref 0.0–0.2)
Basos: 0 %
EOS (ABSOLUTE): 0.1 10*3/uL (ref 0.0–0.4)
Eos: 2 %
Hematocrit: 42.6 % (ref 37.5–51.0)
Hemoglobin: 14.1 g/dL (ref 13.0–17.7)
Immature Grans (Abs): 0 10*3/uL (ref 0.0–0.1)
Immature Granulocytes: 0 %
Lymphocytes Absolute: 1.3 10*3/uL (ref 0.7–3.1)
Lymphs: 24 %
MCH: 29 pg (ref 26.6–33.0)
MCHC: 33.1 g/dL (ref 31.5–35.7)
MCV: 88 fL (ref 79–97)
Monocytes Absolute: 0.6 10*3/uL (ref 0.1–0.9)
Monocytes: 11 %
Neutrophils Absolute: 3.3 10*3/uL (ref 1.4–7.0)
Neutrophils: 63 %
Platelets: 143 10*3/uL — ABNORMAL LOW (ref 150–450)
RBC: 4.86 x10E6/uL (ref 4.14–5.80)
RDW: 15.2 % (ref 11.6–15.4)
WBC: 5.3 10*3/uL (ref 3.4–10.8)

## 2020-06-19 ENCOUNTER — Telehealth: Payer: Self-pay | Admitting: Family

## 2020-06-19 NOTE — Telephone Encounter (Signed)
NTBS.

## 2020-06-19 NOTE — Telephone Encounter (Signed)
Patient Bp last couple of days running high 176/91 and 160/97. He is taken verapamil 180mg  he was taken off his HCTZ 12.5mg  last creatine was 1.45. has CKD. Patient is asking if he can go back on it. Please advise covering pcp.

## 2020-06-19 NOTE — Telephone Encounter (Signed)
Pt has been taking bp meds and his readings from today are 160/97 and 176/91

## 2020-06-20 ENCOUNTER — Ambulatory Visit (INDEPENDENT_AMBULATORY_CARE_PROVIDER_SITE_OTHER): Payer: Medicare Other | Admitting: Family Medicine

## 2020-06-20 ENCOUNTER — Encounter: Payer: Self-pay | Admitting: Family Medicine

## 2020-06-20 ENCOUNTER — Other Ambulatory Visit: Payer: Self-pay

## 2020-06-20 VITALS — BP 171/84 | HR 67 | Temp 97.0°F | Ht 67.0 in | Wt 282.6 lb

## 2020-06-20 DIAGNOSIS — N401 Enlarged prostate with lower urinary tract symptoms: Secondary | ICD-10-CM | POA: Diagnosis not present

## 2020-06-20 DIAGNOSIS — I10 Essential (primary) hypertension: Secondary | ICD-10-CM

## 2020-06-20 DIAGNOSIS — R3912 Poor urinary stream: Secondary | ICD-10-CM

## 2020-06-20 MED ORDER — TERAZOSIN HCL 1 MG PO CAPS: 1.0000 mg | ORAL_CAPSULE | Freq: Every day | ORAL | 1 refills | Status: DC

## 2020-06-20 NOTE — Telephone Encounter (Signed)
Pt is scheduled at 10:55 today with [facility]

## 2020-06-20 NOTE — Progress Notes (Signed)
BP (!) 171/84 (BP Location: Right Arm)   Pulse 67   Temp (!) 97 F (36.1 C)   Ht 5\' 7"  (1.702 m)   Wt 282 lb 9.6 oz (128.2 kg)   BMI 44.26 kg/m    Subjective:   Patient ID: Marcus Carr, male    DOB: [birth year], 80 y.o.   MRN: [id number]  HPI: Marcus Carr is a 80 y.o. male presenting on 06/20/2020 for Hypertension (Last couple days b/p elevated. Taken off HCTZ due to CKD Last Creatine was elevated. Look at last labs)   HPI Hypertension Patient is currently on verapamil, and their blood pressure today is 171/84. Patient denies any lightheadedness or dizziness. Patient denies headaches, blurred vision, chest pains, shortness of breath, or weakness. Denies any side effects from medication and is content with current medication.  Patient was stopped on his hydrochlorothiazide because of renal disease and his blood pressures been running up in the 160s 170s and he had a little headache a couple days.  It is 157/86 and 171/84 here in the office.  He does also admit to being weak urinary stream and urinary frequency  Relevant past medical, surgical, family and social history reviewed and updated as indicated. Interim medical history since our last visit reviewed. Allergies and medications reviewed and updated.  Review of Systems  Constitutional: Negative for chills and fever.  Respiratory: Negative for chest tightness, shortness of breath and wheezing.   Cardiovascular: Negative for chest pain and leg swelling.  Musculoskeletal: Negative for back pain and gait problem.  Skin: Negative for rash.  Neurological: Positive for headaches. Negative for dizziness, weakness, light-headedness and numbness.  All other systems reviewed and are negative.   Per HPI unless specifically indicated above   Allergies as of 06/20/2020      Reactions   Penicillins Rash      Medication List       Accurate as of June 20, 2020 11:31 AM. If you have any questions, ask your nurse or  doctor.        albuterol 108 (90 Base) MCG/ACT inhaler Commonly known as: VENTOLIN HFA TAKE 2 PUFFS BY MOUTH EVERY 6 HOURS AS NEEDED FOR WHEEZE OR SHORTNESS OF BREATH   aspirin 325 MG tablet Take 325 mg by mouth daily.   atorvastatin 20 MG tablet Commonly known as: LIPITOR Take 1 tablet (20 mg total) by mouth daily.   cholecalciferol 10 MCG (400 UNIT) Tabs tablet Commonly known as: VITAMIN D3 Take 800 Units by mouth daily.   citalopram 20 MG tablet Commonly known as: CELEXA Take 1 tablet (20 mg total) by mouth daily.   diclofenac sodium 1 % Gel Commonly known as: Voltaren Apply 4 g topically 4 (four) times daily. To left knee for pain   fish oil-omega-3 fatty acids 1000 MG capsule Take 1 g by mouth 2 (two) times daily.   magnesium (amino acid chelate) 133 MG tablet Take 1 tablet by mouth daily.   NAPROXEN PO Take 500 mg by mouth 2 (two) times a day.   oxyCODONE-acetaminophen 10-325 MG tablet Commonly known as: PERCOCET Take 1 tablet by mouth every 12 (twelve) hours as needed.   terazosin 1 MG capsule Commonly known as: HYTRIN Take 1 capsule (1 mg total) by mouth at bedtime. Started by: Fransisca Kaufmann Mayan Kloepfer, MD   verapamil 180 MG CR tablet Commonly known as: CALAN-SR Take 1 tablet (180 mg total) by mouth daily.        Objective:  BP (!) 171/84 (BP Location: Right Arm)   Pulse 67   Temp (!) 97 F (36.1 C)   Ht 5\' 7"  (1.702 m)   Wt 282 lb 9.6 oz (128.2 kg)   BMI 44.26 kg/m   Wt Readings from Last 3 Encounters:  06/20/20 282 lb 9.6 oz (128.2 kg)  06/07/20 282 lb (127.9 kg)  04/05/20 276 lb (125.2 kg)    Physical Exam Vitals and nursing note reviewed.  Constitutional:      General: He is not in acute distress.    Appearance: He is well-developed. He is not diaphoretic.  Eyes:     General: No scleral icterus.    Conjunctiva/sclera: Conjunctivae normal.  Neck:     Thyroid: No thyromegaly.  Cardiovascular:     Rate and Rhythm: Normal rate and  regular rhythm.     Heart sounds: Normal heart sounds. No murmur heard.   Pulmonary:     Effort: Pulmonary effort is normal. No respiratory distress.     Breath sounds: Wheezing (Small amount of wheezes throughout both lung fields) present.  Musculoskeletal:        General: Normal range of motion.     Cervical back: Neck supple.  Lymphadenopathy:     Cervical: No cervical adenopathy.  Skin:    General: Skin is warm and dry.     Findings: No rash.  Neurological:     Mental Status: He is alert and oriented to person, place, and time.     Coordination: Coordination normal.  Psychiatric:        Behavior: Behavior normal.       Assessment & Plan:   Problem List Items Addressed This Visit      Cardiovascular and Mediastinum   Hypertension - Primary (Chronic)   Relevant Medications   terazosin (HYTRIN) 1 MG capsule    Other Visit Diagnoses    Benign prostatic hyperplasia with weak urinary stream       Relevant Medications   terazosin (HYTRIN) 1 MG capsule    Will start Terazosin to see if that helps with both blood pressure and BPH  Follow-up with PCP in 2 to 3 months  Follow up plan: Return if symptoms worsen or fail to improve, for 2 to 3-week recheck.  Counseling provided for all of the vaccine components No orders of the defined types were placed in this encounter.   Caryl Pina, MD [facility] 06/20/2020, 11:31 AM

## 2020-07-03 ENCOUNTER — Other Ambulatory Visit: Payer: Self-pay | Admitting: Family

## 2020-07-03 DIAGNOSIS — I10 Essential (primary) hypertension: Secondary | ICD-10-CM

## 2020-07-12 ENCOUNTER — Other Ambulatory Visit: Payer: Self-pay | Admitting: Family Medicine

## 2020-07-12 DIAGNOSIS — N401 Enlarged prostate with lower urinary tract symptoms: Secondary | ICD-10-CM

## 2020-07-12 DIAGNOSIS — I10 Essential (primary) hypertension: Secondary | ICD-10-CM

## 2020-07-13 ENCOUNTER — Other Ambulatory Visit: Payer: Self-pay

## 2020-07-13 ENCOUNTER — Ambulatory Visit (INDEPENDENT_AMBULATORY_CARE_PROVIDER_SITE_OTHER): Payer: Medicare Other | Admitting: Family

## 2020-07-13 ENCOUNTER — Encounter: Payer: Self-pay | Admitting: Family

## 2020-07-13 VITALS — BP 155/88 | HR 74 | Temp 97.1°F | Ht 67.0 in | Wt 284.8 lb

## 2020-07-13 DIAGNOSIS — N183 Chronic kidney disease, stage 3 unspecified: Secondary | ICD-10-CM | POA: Diagnosis not present

## 2020-07-13 DIAGNOSIS — N400 Enlarged prostate without lower urinary tract symptoms: Secondary | ICD-10-CM

## 2020-07-13 DIAGNOSIS — I10 Essential (primary) hypertension: Secondary | ICD-10-CM | POA: Diagnosis not present

## 2020-07-13 MED ORDER — TERAZOSIN HCL 2 MG PO CAPS: 2.0000 mg | ORAL_CAPSULE | Freq: Every day | ORAL | 1 refills | Status: DC

## 2020-07-13 NOTE — Progress Notes (Signed)
Subjective:    Patient ID: Marcus Carr, male    DOB: 1940-06-20, 80 y.o.   MRN: [id number]  Chief Complaint  Patient presents with  . Hypertension   PT presents to the office today to recheck HTN. He was seen on 06/20/20 and started on terazosin 1 mg to help with HTN and BPH.   His BP is improved slightly and states he is only waking up to urinated 3 times compared to 6 times a night   Hypertension This is a chronic problem. The current episode started more than 1 year ago. The problem has been waxing and waning since onset. The problem is uncontrolled. Pertinent negatives include no malaise/fatigue, peripheral edema or shortness of breath. Risk factors for coronary artery disease include dyslipidemia, obesity, male gender and sedentary lifestyle. The current treatment provides mild improvement.  Benign Prostatic Hypertrophy This is a chronic problem. The current episode started more than 1 year ago. The problem has been waxing and waning since onset. Irritative symptoms include nocturia (3). Obstructive symptoms include an intermittent stream and a weak stream.      Review of Systems  Constitutional: Negative for malaise/fatigue.  Respiratory: Negative for shortness of breath.   Genitourinary: Positive for nocturia (3).  All other systems reviewed and are negative.      Objective:   Physical Exam Vitals reviewed.  Constitutional:      General: He is not in acute distress.    Appearance: He is well-developed. He is obese.  HENT:     Head: Normocephalic.     Right Ear: Tympanic membrane normal.     Left Ear: Tympanic membrane normal.  Eyes:     General:        Right eye: No discharge.        Left eye: No discharge.     Pupils: Pupils are equal, round, and reactive to light.  Neck:     Thyroid: No thyromegaly.  Cardiovascular:     Rate and Rhythm: Normal rate and regular rhythm.     Heart sounds: Normal heart sounds. No murmur heard.   Pulmonary:     Effort:  Pulmonary effort is normal. No respiratory distress.     Breath sounds: Rhonchi present. No wheezing.  Abdominal:     General: Bowel sounds are normal. There is no distension.     Palpations: Abdomen is soft.     Tenderness: There is no abdominal tenderness.  Musculoskeletal:        General: No tenderness. Normal range of motion.     Cervical back: Normal range of motion and neck supple.  Skin:    General: Skin is warm and dry.     Findings: No erythema or rash.  Neurological:     Mental Status: He is alert and oriented to person, place, and time.     Cranial Nerves: No cranial nerve deficit.     Deep Tendon Reflexes: Reflexes are normal and symmetric.  Psychiatric:        Behavior: Behavior normal.        Thought Content: Thought content normal.        Judgment: Judgment normal.       BP (!) 155/88   Pulse 74   Temp (!) 97.1 F (36.2 C) (Temporal)   Ht '5\' 7"'  (1.702 m)   Wt 284 lb 12.8 oz (129.2 kg)   BMI 44.61 kg/m      Assessment & Plan:  Marcus Carr comes in  today with chief complaint of Hypertension   Diagnosis and orders addressed:  1. Primary hypertension - terazosin (HYTRIN) 2 MG capsule; Take 1 capsule (2 mg total) by mouth at bedtime.  Dispense: 90 capsule; Refill: 1 - BMP8+EGFR  2. Stage 3 chronic kidney disease, unspecified whether stage 3a or 3b CKD (HCC) - BMP8+EGFR  3. Morbid obesity (HCC) - BMP8+EGFR  4. Benign prostatic hyperplasia, unspecified whether lower urinary tract symptoms present - terazosin (HYTRIN) 2 MG capsule; Take 1 capsule (2 mg total) by mouth at bedtime.  Dispense: 90 capsule; Refill: 1 - BMP8+EGFR   Will increase terazosin to 2 mg from 1 mg  Labs pending Stay hydrated RTO 1 month   [name], FNP

## 2020-07-13 NOTE — Patient Instructions (Signed)

## 2020-08-23 ENCOUNTER — Encounter: Payer: Self-pay | Admitting: *Deleted

## 2020-08-28 ENCOUNTER — Other Ambulatory Visit: Payer: Self-pay | Admitting: Family

## 2020-08-29 DIAGNOSIS — Z79899 Other long term (current) drug therapy: Secondary | ICD-10-CM | POA: Diagnosis not present

## 2020-08-29 DIAGNOSIS — Z5181 Encounter for therapeutic drug level monitoring: Secondary | ICD-10-CM | POA: Diagnosis not present

## 2020-08-29 DIAGNOSIS — G894 Chronic pain syndrome: Secondary | ICD-10-CM | POA: Diagnosis not present

## 2020-09-07 ENCOUNTER — Ambulatory Visit: Payer: Medicare Other | Admitting: Family

## 2020-09-19 ENCOUNTER — Encounter: Payer: Self-pay | Admitting: Family

## 2020-09-19 ENCOUNTER — Other Ambulatory Visit: Payer: Self-pay

## 2020-09-19 ENCOUNTER — Ambulatory Visit (INDEPENDENT_AMBULATORY_CARE_PROVIDER_SITE_OTHER): Payer: Medicare Other | Admitting: Family

## 2020-09-19 VITALS — BP 163/77 | HR 74 | Temp 97.5°F | Ht 67.0 in | Wt 285.0 lb

## 2020-09-19 DIAGNOSIS — G8929 Other chronic pain: Secondary | ICD-10-CM | POA: Diagnosis not present

## 2020-09-19 DIAGNOSIS — E785 Hyperlipidemia, unspecified: Secondary | ICD-10-CM | POA: Diagnosis not present

## 2020-09-19 DIAGNOSIS — N183 Chronic kidney disease, stage 3 unspecified: Secondary | ICD-10-CM

## 2020-09-19 DIAGNOSIS — M549 Dorsalgia, unspecified: Secondary | ICD-10-CM | POA: Diagnosis not present

## 2020-09-19 DIAGNOSIS — F419 Anxiety disorder, unspecified: Secondary | ICD-10-CM

## 2020-09-19 DIAGNOSIS — I10 Essential (primary) hypertension: Secondary | ICD-10-CM | POA: Diagnosis not present

## 2020-09-19 MED ORDER — LISINOPRIL 20 MG PO TABS: 20.0000 mg | ORAL_TABLET | Freq: Every day | ORAL | 3 refills | Status: DC

## 2020-09-19 NOTE — Progress Notes (Signed)
Subjective:    Patient ID: Marcus Carr, male    DOB: Apr 06, 1940, 80 y.o.   MRN: [id number]  Chief Complaint  Patient presents with   Medical Management of Chronic Issues    Blood pressure has been up for the past couple of weeks   Pt presents to the office today for chronic follow up. PT has history of osteomyelitis of mandible, doing stable at this time. Pt is followed by Pain Management every 3 months for chronic back pain.   Hypertension This is a chronic problem. The current episode started more than 1 year ago. The problem has been resolved since onset. The problem is controlled. Associated symptoms include anxiety. Pertinent negatives include no malaise/fatigue, peripheral edema or shortness of breath. Risk factors for coronary artery disease include dyslipidemia and obesity. The current treatment provides moderate improvement. Hypertensive end-organ damage includes kidney disease.  Benign Prostatic Hypertrophy This is a chronic problem. The current episode started more than 1 year ago. The problem has been resolved since onset. Irritative symptoms include nocturia (3). Obstructive symptoms include straining. Pertinent negatives include no hematuria or vomiting.  Hyperlipidemia This is a chronic problem. The current episode started more than 1 year ago. The problem is controlled. Exacerbating diseases include obesity. Pertinent negatives include no shortness of breath. Current antihyperlipidemic treatment includes statins. The current treatment provides moderate improvement of lipids. Risk factors for coronary artery disease include dyslipidemia, male sex, hypertension and a sedentary lifestyle.  Anxiety Presents for follow-up visit. Symptoms include depressed mood, irritability and nervous/anxious behavior. Patient reports no shortness of breath. Symptoms occur most days. The severity of symptoms is moderate.    Back Pain This is a chronic problem. The current episode started  more than 1 year ago. The problem occurs intermittently. The pain is present in the lumbar spine. The quality of the pain is described as aching. The pain is at a severity of 4/10. The pain is moderate. He has tried bed rest for the symptoms. The treatment provided moderate relief.     Review of Systems  Constitutional:  Positive for irritability. Negative for malaise/fatigue.  Respiratory:  Negative for shortness of breath.   Gastrointestinal:  Negative for vomiting.  Genitourinary:  Positive for nocturia (3). Negative for hematuria.  Musculoskeletal:  Positive for back pain.  Psychiatric/Behavioral:  The patient is nervous/anxious.   All other systems reviewed and are negative.     Objective:   Physical Exam Vitals reviewed.  Constitutional:      General: He is not in acute distress.    Appearance: He is well-developed.  HENT:     Head: Normocephalic.     Right Ear: Tympanic membrane normal.     Left Ear: Tympanic membrane normal.  Eyes:     General:        Right eye: No discharge.        Left eye: No discharge.     Pupils: Pupils are equal, round, and reactive to light.  Neck:     Thyroid: No thyromegaly.  Cardiovascular:     Rate and Rhythm: Normal rate and regular rhythm.     Heart sounds: Normal heart sounds. No murmur heard. Pulmonary:     Effort: Pulmonary effort is normal. No respiratory distress.     Breath sounds: Normal breath sounds. No wheezing.  Abdominal:     General: Bowel sounds are normal. There is no distension.     Palpations: Abdomen is soft.     Tenderness: There  is no abdominal tenderness.  Musculoskeletal:        General: No tenderness. Normal range of motion.     Cervical back: Normal range of motion and neck supple.  Skin:    General: Skin is warm and dry.     Findings: No erythema or rash.  Neurological:     Mental Status: He is alert and oriented to person, place, and time.     Cranial Nerves: No cranial nerve deficit.     Deep Tendon  Reflexes: Reflexes are normal and symmetric.  Psychiatric:        Behavior: Behavior normal.        Thought Content: Thought content normal.        Judgment: Judgment normal.      BP (!) 163/77   Pulse 74   Temp (!) 97.5 F (36.4 C) (Temporal)   Ht '5\' 7"'  (1.702 m)   Wt 285 lb (129.3 kg)   SpO2 92%   BMI 44.64 kg/m      Assessment & Plan:  Marcus Carr comes in today with chief complaint of Medical Management of Chronic Issues (Blood pressure has been up for the past couple of weeks)   Diagnosis and orders addressed:  1. Primary hypertension Will add Lisinopril 20 mg today -Daily blood pressure log given with instructions on how to fill out and told to bring to next visit -Dash diet information given -Exercise encouraged - Stress Management  -Continue current meds - BMP8+EGFR  2. Stage 3 chronic kidney disease, unspecified whether stage 3a or 3b CKD (HCC) - BMP8+EGFR  3. Hyperlipidemia, unspecified hyperlipidemia type  - BMP8+EGFR  4. Morbid obesity (HCC) - BMP8+EGFR  5. Chronic bilateral back pain, unspecified back location - BMP8+EGFR  6. Anxiety - BMP8+EGFR   Labs pending Health Maintenance reviewed Diet and exercise encouraged  Follow up plan: 2 weeks to recheck HTN  [name], FNP

## 2020-09-19 NOTE — Patient Instructions (Signed)

## 2020-09-20 LAB — BMP8+EGFR
BUN/Creatinine Ratio: 14 (ref 10–24)
BUN: 21 mg/dL (ref 8–27)
CO2: 25 mmol/L (ref 20–29)
Calcium: 9.4 mg/dL (ref 8.6–10.2)
Chloride: 102 mmol/L (ref 96–106)
Creatinine, Ser: 1.49 mg/dL — ABNORMAL HIGH (ref 0.76–1.27)
Glucose: 116 mg/dL — ABNORMAL HIGH (ref 65–99)
Potassium: 5.1 mmol/L (ref 3.5–5.2)
Sodium: 142 mmol/L (ref 134–144)
eGFR: 48 mL/min/{1.73_m2} — ABNORMAL LOW (ref >59)

## 2020-09-26 ENCOUNTER — Other Ambulatory Visit: Payer: Self-pay | Admitting: Family

## 2020-09-26 DIAGNOSIS — I10 Essential (primary) hypertension: Secondary | ICD-10-CM

## 2020-10-04 ENCOUNTER — Ambulatory Visit: Payer: Medicare Other | Admitting: Family

## 2020-10-30 ENCOUNTER — Ambulatory Visit (INDEPENDENT_AMBULATORY_CARE_PROVIDER_SITE_OTHER): Payer: Medicare Other | Admitting: Family

## 2020-10-30 ENCOUNTER — Encounter: Payer: Self-pay | Admitting: Family

## 2020-10-30 ENCOUNTER — Other Ambulatory Visit: Payer: Self-pay

## 2020-10-30 VITALS — BP 138/63 | HR 70 | Temp 97.0°F | Ht 67.0 in | Wt 286.0 lb

## 2020-10-30 DIAGNOSIS — I10 Essential (primary) hypertension: Secondary | ICD-10-CM

## 2020-10-30 LAB — BMP8+EGFR
BUN/Creatinine Ratio: 14 (ref 10–24)
BUN: 21 mg/dL (ref 8–27)
CO2: 24 mmol/L (ref 20–29)
Calcium: 9.1 mg/dL (ref 8.6–10.2)
Chloride: 104 mmol/L (ref 96–106)
Creatinine, Ser: 1.48 mg/dL — ABNORMAL HIGH (ref 0.76–1.27)
Glucose: 104 mg/dL — ABNORMAL HIGH (ref 65–99)
Potassium: 5.2 mmol/L (ref 3.5–5.2)
Sodium: 145 mmol/L — ABNORMAL HIGH (ref 134–144)
eGFR: 48 mL/min/{1.73_m2} — ABNORMAL LOW (ref >59)

## 2020-10-30 NOTE — Patient Instructions (Signed)

## 2020-10-30 NOTE — Progress Notes (Signed)
   Subjective:    Patient ID: Marcus Carr, male    DOB: Sep 21, 1940, 80 y.o.   MRN: [id number]  Chief Complaint  Patient presents with   Hypertension    2 week follow up has list from home    PT presents to the office today recheck HTN. Pt was seen on 09/19/20 and we added Lisinopril 20 mg daily. He reports his BP has ranged from 105-152/63-88. He is feeling good.  Hypertension This is a chronic problem. The current episode started more than 1 year ago. The problem has been waxing and waning since onset. Pertinent negatives include no malaise/fatigue, peripheral edema or shortness of breath. Risk factors for coronary artery disease include dyslipidemia, obesity, male gender and sedentary lifestyle. The current treatment provides moderate improvement.     Review of Systems  Constitutional:  Negative for malaise/fatigue.  Respiratory:  Negative for shortness of breath.   All other systems reviewed and are negative.     Objective:   Physical Exam Vitals reviewed.  Constitutional:      General: He is not in acute distress.    Appearance: He is well-developed. He is obese.  HENT:     Head: Normocephalic.  Eyes:     General:        Right eye: No discharge.        Left eye: No discharge.     Pupils: Pupils are equal, round, and reactive to light.  Neck:     Thyroid: No thyromegaly.  Cardiovascular:     Rate and Rhythm: Normal rate and regular rhythm.     Heart sounds: Normal heart sounds. No murmur heard. Pulmonary:     Effort: Pulmonary effort is normal. No respiratory distress.     Breath sounds: Normal breath sounds. No wheezing.  Abdominal:     General: Bowel sounds are normal. There is no distension.     Palpations: Abdomen is soft.     Tenderness: There is no abdominal tenderness.  Musculoskeletal:        General: No tenderness. Normal range of motion.     Cervical back: Normal range of motion and neck supple.  Skin:    General: Skin is warm and dry.      Findings: No erythema or rash.  Neurological:     Mental Status: He is alert and oriented to person, place, and time.     Cranial Nerves: No cranial nerve deficit.     Deep Tendon Reflexes: Reflexes are normal and symmetric.  Psychiatric:        Behavior: Behavior normal.        Thought Content: Thought content normal.        Judgment: Judgment normal.     BP 138/63 Comment: patient reports at home  Pulse 70   Temp (!) 97 F (36.1 C) (Temporal)   Ht _0  (1.702 m)   Wt 286 lb (129.7 kg)   SpO2 94%   BMI 44.79 kg/m      Assessment & Plan:  Marcus Carr comes in today with chief complaint of Hypertension (2 week follow up has list from home )   Diagnosis and orders addressed:  1. Primary hypertension -Dash diet information given -Exercise encouraged - Stress Management  -Continue current meds - BMP8+EGFR  2. Morbid obesity ([place])   Labs pending Health Maintenance reviewed Diet and exercise encouraged  Follow up plan: 6 months   [name], FNP

## 2020-11-15 ENCOUNTER — Ambulatory Visit (INDEPENDENT_AMBULATORY_CARE_PROVIDER_SITE_OTHER): Payer: Medicare Other

## 2020-11-15 VITALS — Ht 67.0 in | Wt 286.0 lb

## 2020-11-15 DIAGNOSIS — Z0000 Encounter for general adult medical examination without abnormal findings: Secondary | ICD-10-CM | POA: Diagnosis not present

## 2020-11-15 NOTE — Progress Notes (Signed)
Subjective:   Jadaveon Bechler is a 80 y.o. male who presents for Medicare Annual/Subsequent preventive examination.  Virtual Visit via Telephone Note  I connected with  [name] on 11/15/20 at  1:15 PM EDT by telephone and verified that I am speaking with the correct person using two identifiers.  Location: Patient: Home Provider: [facility] Persons participating in the virtual visit: patient/Nurse Health Advisor   I discussed the limitations, risks, security and privacy concerns of performing an evaluation and management service by telephone and the availability of in person appointments. The patient expressed understanding and agreed to proceed.  Interactive audio and video telecommunications were attempted between this nurse and patient, however failed, due to patient having technical difficulties OR patient did not have access to video capability.  We continued and completed visit with audio only.  Some vital signs may be absent or patient reported.   Tazaria Dlugosz E Shenice Dolder, LPN   Review of Systems     Cardiac Risk Factors include: advanced age (>38mn, >>72women);male gender;obesity (BMI >30kg/m2);sedentary lifestyle;smoking/ tobacco exposure;Other (see comment);dyslipidemia;hypertension, Risk factor comments: COPD     Objective:    Today's Vitals   11/15/20 1312  Weight: 286 lb (129.7 kg)  Height: '5\' 7"'$  (1.702 m)   Body mass index is 44.79 kg/m.  Advanced Directives 11/15/2020 11/15/2019 11/08/2018 07/15/2017 07/08/2017 11/06/2014  Does Patient Have a Medical Advance Directive? No No No No No No  Would patient like information on creating a medical advance directive? No - Patient declined No - Patient declined Yes (MAU/Ambulatory/Procedural Areas - Information given) Yes (MAU/Ambulatory/Procedural Areas - Information given) Yes (MAU/Ambulatory/Procedural Areas - Information given) -    Current Medications (verified) Outpatient Encounter Medications as of 11/15/2020   Medication Sig   albuterol (VENTOLIN HFA) 108 (90 Base) MCG/ACT inhaler TAKE 2 PUFFS BY MOUTH EVERY 6 HOURS AS NEEDED FOR WHEEZE OR SHORTNESS OF BREATH   aspirin 325 MG tablet Take 325 mg by mouth daily.   atorvastatin (LIPITOR) 20 MG tablet Take 1 tablet (20 mg total) by mouth daily.   cholecalciferol (VITAMIN D) 400 UNITS TABS tablet Take 800 Units by mouth daily.   citalopram (CELEXA) 20 MG tablet Take 1 tablet (20 mg total) by mouth daily.   diclofenac sodium (VOLTAREN) 1 % GEL Apply 4 g topically 4 (four) times daily. To left knee for pain   fish oil-omega-3 fatty acids 1000 MG capsule Take 1 g by mouth 2 (two) times daily.    lisinopril (ZESTRIL) 20 MG tablet Take 1 tablet (20 mg total) by mouth daily.   naproxen (NAPROSYN) 500 MG tablet    oxyCODONE-acetaminophen (PERCOCET) 10-325 MG per tablet Take 1 tablet by mouth every 12 (twelve) hours as needed.   Specialty Vitamins Products (MAGNESIUM, AMINO ACID CHELATE,) 133 MG tablet Take 1 tablet by mouth daily.   terazosin (HYTRIN) 2 MG capsule Take 1 capsule (2 mg total) by mouth at bedtime.   verapamil (CALAN-SR) 180 MG CR tablet TAKE 1 TABLET BY MOUTH  DAILY   No facility-administered encounter medications on file as of 11/15/2020.    Allergies (verified) Penicillins   History: Past Medical History:  Diagnosis Date   Abscess, jaw    Anxiety    Back pain    chronic back pain treated by Dr. RNelva Bush  Chronic kidney disease    Chronic mastoiditis of both sides 11/07/2014   Cigarette smoker 11/07/2014   Gout    Hyperlipidemia    Hypertension  Morbid obesity ([place]) 11/07/2014   Osteomyelitis of mandible 11/07/2014   Poor dentition 11/07/2014   Thyroid nodule 11/07/2014   Past Surgical History:  Procedure Laterality Date   APPENDECTOMY     TONSILLECTOMY     Family History  Problem Relation Age of Onset   Cancer Mother        esophageal   Heart attack Father 44   Social History   Socioeconomic History   Marital status:  Married    Spouse name: Rosa   Number of children: 2   Years of education: 8   Highest education level: 8th grade  Occupational History   Occupation: Retired  Tobacco Use   Smoking status: Former    Packs/day: 1.50    Years: 50.00    Pack years: 75.00    Types: Cigarettes    Quit date: 10/24/2014    Years since quitting: 6.0   Smokeless tobacco: Never  Vaping Use   Vaping Use: Never used  Substance and Sexual Activity   Alcohol use: No    Alcohol/week: 0.0 standard drinks   Drug use: No   Sexual activity: Not on file  Other Topics Concern   Not on file  Social History Narrative   Lives wife and grandson stays with them a lot.  Two daughters - both live within 12 miles   Social Determinants of Health   Financial Resource Strain: Low Risk    Difficulty of Paying Living Expenses: Not hard at all  Food Insecurity: No Food Insecurity   Worried About Charity fundraiser in the Last Year: Never true   Arboriculturist in the Last Year: Never true  Transportation Needs: No Transportation Needs   Lack of Transportation (Medical): No   Lack of Transportation (Non-Medical): No  Physical Activity: Sufficiently Active   Days of Exercise per Week: 5 days   Minutes of Exercise per Session: 30 min  Stress: No Stress Concern Present   Feeling of Stress : Not at all  Social Connections: Moderately Isolated   Frequency of Communication with Friends and Family: More than three times a week   Frequency of Social Gatherings with Friends and Family: More than three times a week   Attends Religious Services: Never   Marine scientist or Organizations: No   Attends Music therapist: Never   Marital Status: Married    Tobacco Counseling Counseling given: Not Answered   Clinical Intake:  Pre-visit preparation completed: Yes  Pain : No/denies pain     BMI - recorded: 44.79 Nutritional Status: BMI > 30  Obese Nutritional Risks: None Diabetes: No  How often do  you need to have someone help you when you read instructions, pamphlets, or other written materials from your doctor or pharmacy?: 1 - Never  Diabetic? No  Interpreter Needed?: No  Information entered by :: Clenton Esper, LPN   Activities of Daily Living In your present state of health, do you have any difficulty performing the following activities: 11/15/2020 11/30/2019  Hearing? N N  Vision? N N  Difficulty concentrating or making decisions? Y N  Walking or climbing stairs? N N  Dressing or bathing? N N  Doing errands, shopping? N N  Preparing Food and eating ? N N  Using the Toilet? N N  In the past six months, have you accidently leaked urine? N N  Do you have problems with loss of bowel control? N N  Managing your Medications? N N  Managing your Finances? N N  Housekeeping or managing your Housekeeping? N N  Some recent data might be hidden    Patient Care Team: Sharion Balloon, FNP as PCP - General (Nurse Practitioner) Arnetha Courser (Inactive) as Surgeon Verlin Grills, MD as Attending Physician (Infectious Diseases)  Indicate any recent Medical Services you may have received from other than [facility] providers in the past year (date may be approximate).     Assessment:   This is a routine wellness examination for Zytavious.  Hearing/Vision screen Hearing Screening - Comments:: Denies hearing difficulties  Vision Screening - Comments:: Wears eyeglasses - up to date with annual eye exams with Dr Marin Comment in [place]  Dietary issues and exercise activities discussed: Current Exercise Habits: Home exercise routine, Type of exercise: walking;Other - see comments (yard work), Time (Minutes): 30, Frequency (Times/Week): 5, Weekly Exercise (Minutes/Week): 150, Intensity: Mild, Exercise limited by: orthopedic condition(s);respiratory conditions(s)   Goals Addressed             This Visit's Progress    Exercise 3x per week (30 min per time)   On track    Patient is a member at the  [facility]- increase gym visits to 3 times per week for at 20-30 minutes on the stationary bike.       Depression Screen PHQ 2/9 Scores 11/15/2020 10/30/2020 09/19/2020 07/13/2020 06/20/2020 06/07/2020 03/27/2020  PHQ - 2 Score 0 0 0 0 0 0 0  PHQ- 9 Score 2 - 1 - - - 0    Fall Risk Fall Risk  11/15/2020 10/30/2020 09/19/2020 09/19/2020 07/13/2020  Falls in the past year? 0 0 0 0 0  Number falls in past yr: 0 - - - -  Injury with Fall? 0 - - - -  Risk for fall due to : Orthopedic patient;Medication side effect;Impaired vision - - - -  Risk for fall due to: Comment - - - - -  Follow up Education provided;Falls prevention discussed - - - -    FALL RISK PREVENTION PERTAINING TO THE HOME:  Any stairs in or around the home? No  If so, are there any without handrails? No  Home free of loose throw rugs in walkways, pet beds, electrical cords, etc? Yes  Adequate lighting in your home to reduce risk of falls? Yes   ASSISTIVE DEVICES UTILIZED TO PREVENT FALLS:  Life alert? No  Use of a cane, walker or w/c? No  Grab bars in the bathroom? Yes  Shower chair or bench in shower? No  Elevated toilet seat or a handicapped toilet? No   TIMED UP AND GO:  Was the test performed? No . Telephonic visit  Cognitive Function: MMSE - Mini Mental State Exam 07/08/2017  Orientation to time 5  Orientation to Place 5  Registration 3  Attention/ Calculation 0  Recall 2  Language- name 2 objects 2  Language- repeat 1  Language- follow 3 step command 3  Language- read & follow direction 1  Write a sentence 1  Copy design 1  Total score 24     6CIT Screen 11/15/2020 11/15/2019 11/15/2019 11/08/2018  What Year? 0 points - 0 points 0 points  What month? 0 points - 0 points 0 points  What time? 0 points - 0 points 0 points  Count back from 20 0 points 4 points 0 points 2 points  Months in reverse 2 points - 0 points 2 points  Repeat phrase 4 points - 0 points 2 points  Total Score 6 - 0 6     Immunizations Immunization History  Administered Date(s) Administered   Influenza Split 01/05/2013   Influenza, High Dose Seasonal PF 01/21/2014, 01/01/2015, 01/14/2016, 01/13/2017, 01/16/2018, 12/19/2018, 01/02/2020   Influenza,inj,quad, With Preservative 12/22/2016, 01/13/2018   Influenza-Unspecified 01/21/2014, 01/01/2015   Moderna Sars-Covid-2 Vaccination 04/16/2019, 05/14/2019   Pneumococcal Conjugate-13 10/24/2015   Pneumococcal Polysaccharide-23 07/08/2017   Tdap 07/08/2017    TDAP status: Up to date  Flu Vaccine status: Up to date  Pneumococcal vaccine status: Up to date  Covid-19 vaccine status: Information provided on how to obtain vaccines.   Qualifies for Shingles Vaccine? Yes   Zostavax completed No   Shingrix Completed?: No.    Education has been provided regarding the importance of this vaccine. Patient has been advised to call insurance company to determine out of pocket expense if they have not yet received this vaccine. Advised may also receive vaccine at local pharmacy or Health Dept. Verbalized acceptance and understanding.  Screening Tests Health Maintenance  Topic Date Due   COVID-19 Vaccine (3 - Moderna risk series) 11/15/2020 (Originally 06/11/2019)   Zoster Vaccines- Shingrix (1 of 2) 12/20/2020 (Originally 02/09/1960)   INFLUENZA VACCINE  06/21/2021 (Originally 10/22/2020)   TETANUS/TDAP  07/09/2027   Hepatitis C Screening  Completed   PNA vac Low Risk Adult  Completed   HPV VACCINES  Aged Out    Health Maintenance  There are no preventive care reminders to display for this patient.  Colorectal cancer screening: No longer required.   Lung Cancer Screening: (Low Dose CT Chest recommended if Age 24-80 years, 30 pack-year currently smoking OR have quit w/in 15years.) does qualify.   Lung Cancer Screening Referral:  He had one in March and will continue once annually  Additional Screening:  Hepatitis C Screening: does not qualify  Vision  Screening: Recommended annual ophthalmology exams for early detection of glaucoma and other disorders of the eye. Is the patient up to date with their annual eye exam?  Yes  Who is the provider or what is the name of the office in which the patient attends annual eye exams? [facility] If pt is not established with a provider, would they like to be referred to a provider to establish care? No .   Dental Screening: Recommended annual dental exams for proper oral hygiene  Community Resource Referral / Chronic Care Management: CRR required this visit?  No   CCM required this visit?  No      Plan:     I have personally reviewed and noted the following in the patient's chart:   Medical and social history Use of alcohol, tobacco or illicit drugs  Current medications and supplements including opioid prescriptions. Patient is currently taking opioid prescriptions. Information provided to patient regarding non-opioid alternatives. Patient advised to discuss non-opioid treatment plan with their provider. Functional ability and status Nutritional status Physical activity Advanced directives List of other physicians Hospitalizations, surgeries, and ER visits in previous 12 months Vitals Screenings to include cognitive, depression, and falls Referrals and appointments  In addition, I have reviewed and discussed with patient certain preventive protocols, quality metrics, and best practice recommendations. A written personalized care plan for preventive services as well as general preventive health recommendations were provided to patient.     Sandrea Hammond, LPN   [id number]   Nurse Notes: None

## 2020-11-15 NOTE — Patient Instructions (Signed)
Marcus Carr , Thank you for taking time to come for your Medicare Wellness Visit. I appreciate your ongoing commitment to your health goals. Please review the following plan we discussed and let me know if I can assist you in the future.   Screening recommendations/referrals: Colonoscopy: No longer required Recommended yearly ophthalmology/optometry visit for glaucoma screening and checkup Recommended yearly dental visit for hygiene and checkup  Vaccinations: Influenza vaccine: Done 10/11/221 - Repeat annually Pneumococcal vaccine: Done 10/24/2015 & 07/08/2017 Tdap vaccine: Done 07/08/2017 - Repeat in 10 years Shingles vaccine: Due. Shingrix discussed. Please contact your pharmacy for coverage information.     Covid-19: Done 04/16/19 & 05/14/19 - Due for booster  Advanced directives: Advance directive discussed with you today. Even though you declined this today, please call our office should you change your mind, and we can give you the proper paperwork for you to fill out.   Conditions/risks identified: Aim for 30 minutes of exercise or brisk walking each day, drink 6-8 glasses of water and eat lots of fruits and vegetables.   Next appointment: Follow up in one year for your annual wellness visit.   Preventive Care 30 Years and Older, Male  Preventive care refers to lifestyle choices and visits with your health care provider that can promote health and wellness. What does preventive care include? A yearly physical exam. This is also called an annual well check. Dental exams once or twice a year. Routine eye exams. Ask your health care provider how often you should have your eyes checked. Personal lifestyle choices, including: Daily care of your teeth and gums. Regular physical activity. Eating a healthy diet. Avoiding tobacco and drug use. Limiting alcohol use. Practicing safe sex. Taking low doses of aspirin every day. Taking vitamin and mineral supplements as recommended by your  health care provider. What happens during an annual well check? The services and screenings done by your health care provider during your annual well check will depend on your age, overall health, lifestyle risk factors, and family history of disease. Counseling  Your health care provider may ask you questions about your: Alcohol use. Tobacco use. Drug use. Emotional well-being. Home and relationship well-being. Sexual activity. Eating habits. History of falls. Memory and ability to understand (cognition). Work and work Statistician. Screening  You may have the following tests or measurements: Height, weight, and BMI. Blood pressure. Lipid and cholesterol levels. These may be checked every 5 years, or more frequently if you are over 75 years old. Skin check. Lung cancer screening. You may have this screening every year starting at age 16 if you have a 30-pack-year history of smoking and currently smoke or have quit within the past 15 years. Fecal occult blood test (FOBT) of the stool. You may have this test every year starting at age 79. Flexible sigmoidoscopy or colonoscopy. You may have a sigmoidoscopy every 5 years or a colonoscopy every 10 years starting at age 48. Prostate cancer screening. Recommendations will vary depending on your family history and other risks. Hepatitis C blood test. Hepatitis B blood test. Sexually transmitted disease (STD) testing. Diabetes screening. This is done by checking your blood sugar (glucose) after you have not eaten for a while (fasting). You may have this done every 1-3 years. Abdominal aortic aneurysm (AAA) screening. You may need this if you are a current or former smoker. Osteoporosis. You may be screened starting at age 34 if you are at high risk. Talk with your health care provider about your test  results, treatment options, and if necessary, the need for more tests. Vaccines  Your health care provider may recommend certain vaccines, such  as: Influenza vaccine. This is recommended every year. Tetanus, diphtheria, and acellular pertussis (Tdap, Td) vaccine. You may need a Td booster every 10 years. Zoster vaccine. You may need this after age 42. Pneumococcal 13-valent conjugate (PCV13) vaccine. One dose is recommended after age 2. Pneumococcal polysaccharide (PPSV23) vaccine. One dose is recommended after age 38. Talk to your health care provider about which screenings and vaccines you need and how often you need them. This information is not intended to replace advice given to you by your health care provider. Make sure you discuss any questions you have with your health care provider. Document Released: 04/06/2015 Document Revised: 11/28/2015 Document Reviewed: 01/09/2015 Elsevier Interactive Patient Education  2017 [place] Prevention in the Home Falls can cause injuries. They can happen to people of all ages. There are many things you can do to make your home safe and to help prevent falls. What can I do on the outside of my home? Regularly fix the edges of walkways and driveways and fix any cracks. Remove anything that might make you trip as you walk through a door, such as a raised step or threshold. Trim any bushes or trees on the path to your home. Use bright outdoor lighting. Clear any walking paths of anything that might make someone trip, such as rocks or tools. Regularly check to see if handrails are loose or broken. Make sure that both sides of any steps have handrails. Any raised decks and porches should have guardrails on the edges. Have any leaves, snow, or ice cleared regularly. Use sand or salt on walking paths during winter. Clean up any spills in your garage right away. This includes oil or grease spills. What can I do in the bathroom? Use night lights. Install grab bars by the toilet and in the tub and shower. Do not use towel bars as grab bars. Use non-skid mats or decals in the tub or  shower. If you need to sit down in the shower, use a plastic, non-slip stool. Keep the floor dry. Clean up any water that spills on the floor as soon as it happens. Remove soap buildup in the tub or shower regularly. Attach bath mats securely with double-sided non-slip rug tape. Do not have throw rugs and other things on the floor that can make you trip. What can I do in the bedroom? Use night lights. Make sure that you have a light by your bed that is easy to reach. Do not use any sheets or blankets that are too big for your bed. They should not hang down onto the floor. Have a firm chair that has side arms. You can use this for support while you get dressed. Do not have throw rugs and other things on the floor that can make you trip. What can I do in the kitchen? Clean up any spills right away. Avoid walking on wet floors. Keep items that you use a lot in easy-to-reach places. If you need to reach something above you, use a strong step stool that has a grab bar. Keep electrical cords out of the way. Do not use floor polish or wax that makes floors slippery. If you must use wax, use non-skid floor wax. Do not have throw rugs and other things on the floor that can make you trip. What can I do with my stairs?  Do not leave any items on the stairs. Make sure that there are handrails on both sides of the stairs and use them. Fix handrails that are broken or loose. Make sure that handrails are as long as the stairways. Check any carpeting to make sure that it is firmly attached to the stairs. Fix any carpet that is loose or worn. Avoid having throw rugs at the top or bottom of the stairs. If you do have throw rugs, attach them to the floor with carpet tape. Make sure that you have a light switch at the top of the stairs and the bottom of the stairs. If you do not have them, ask someone to add them for you. What else can I do to help prevent falls? Wear shoes that: Do not have high heels. Have  rubber bottoms. Are comfortable and fit you well. Are closed at the toe. Do not wear sandals. If you use a stepladder: Make sure that it is fully opened. Do not climb a closed stepladder. Make sure that both sides of the stepladder are locked into place. Ask someone to hold it for you, if possible. Clearly mark and make sure that you can see: Any grab bars or handrails. First and last steps. Where the edge of each step is. Use tools that help you move around (mobility aids) if they are needed. These include: Canes. Walkers. Scooters. Crutches. Turn on the lights when you go into a dark area. Replace any light bulbs as soon as they burn out. Set up your furniture so you have a clear path. Avoid moving your furniture around. If any of your floors are uneven, fix them. If there are any pets around you, be aware of where they are. Review your medicines with your doctor. Some medicines can make you feel dizzy. This can increase your chance of falling. Ask your doctor what other things that you can do to help prevent falls. This information is not intended to replace advice given to you by your health care provider. Make sure you discuss any questions you have with your health care provider. Document Released: 01/04/2009 Document Revised: 08/16/2015 Document Reviewed: 04/14/2014 Elsevier Interactive Patient Education  2017 [employer]   Managing Pain Without Opioids Opioids are strong medicines used to treat moderate to severe pain. For some people, especially those who have long-term (chronic) pain, opioids may not be the best choice for pain management due to: Side effects like nausea, constipation, and sleepiness. The risk of addiction (opioid use disorder). The longer you take opioids, the greater your risk of addiction. Pain that lasts for more than 3 months is called chronic pain. Managing chronic pain usually requires more than one approach and is often provided by a team of health  care providers working together (multidisciplinary approach). Pain management may be done at a pain management center or pain clinic. Types of pain management without opioids Managing pain without opioids can involve: Non-opioid medicines. Exercises to help relieve pain and improve strength and range of motion (physical therapy). Therapy to help with everyday tasks and activities (occupational therapy). Therapy to help you find ways to relieve pain by doing things you enjoy (recreational therapy). Talk therapy (psychotherapy) and other mental health therapies. Medical treatments such as injections or devices. Making lifestyle changes. Pain management options Non-opioid medicines Non-opioid medicines for pain may include medicines taken by mouth (oral medicines), such as: Over-the-counter or prescription NSAIDs. These may be the first medicines used for pain. They work well for  muscle and bone pain, and they reduce swelling. Acetaminophen. This over-the-counter medicine may work well for milder pain but not swelling. Antidepressants. These may be used to treat chronic pain. A certain type of antidepressant (tricyclics) is often used. These medicines are given in lower doses for pain than when used for depression. Anticonvulsants. These are usually used to treat seizures but may also reduce nerve (neuropathic) pain. Muscle relaxants. These relieve pain caused by sudden muscle tightening (spasms). You may also use a type of pain medicine that is applied to the skin as a patch, cream, or gel (topical analgesic), such as a numbing medicine. These may cause fewer side effects than oralmedicines. Therapy Physical therapy involves doing exercises to gain strength and flexibility. A physical therapist may teach you exercises to move and stretch parts of your body that are weak, stiff, or painful. You can learn these exercises at physical therapy visits and practice them at home. Physical therapy may also  involve: Massage. Heat wraps or applying heat or cold to affected areas. Sending electrical signals through the skin to interrupt pain signals (transcutaneous electrical nerve stimulation, TENS). Sending weak lasers through the skin to reduce pain and swelling (low-level laser therapy). Using signals from your body to help you learn to regulate pain (biofeedback). Occupational therapy helps you learn ways to function at home and work withless pain. Recreational therapy may involve trying new activities or hobbies, such asdrawing or a physical activity. Types of mental health therapy for pain include: Cognitive behavioral therapy (CBT) to help you learn coping skills for dealing with pain. Acceptance and commitment therapy (ACT) to change the way you think and react to pain. Relaxation therapies, including muscle relaxation exercises and focusing your mind on the present moment to lower stress (mindfulness-based stress reduction). Pain management counseling. This may be individual, family, or group counseling.  Medical treatments Medical treatments for pain management include: Nerve block injections. These may include a pain blocker and anti-inflammatory medicines. You may have injections: Near the spine to relieve chronic back or neck pain. Into joints to relieve back or joint pain. Into nerve areas that supply a painful area to relieve body pain. Into muscles (trigger point injections) to relieve some painful muscle conditions. A medical device placed near your spine to help block pain signals and relieve nerve pain or chronic back pain (spinal cord stimulation device). Acupuncture. Follow these instructions at home Medicines Take over-the-counter and prescription medicines only as told by your health care provider. If you are taking pain medicine, ask your health care providers about possible side effects to watch out for. Do not drive or use heavy machinery while taking prescription  pain medicine. Lifestyle  Do not use drugs or alcohol to reduce pain. Limit alcohol intake to no more than 1 drink a day for nonpregnant women and 2 drinks a day for men. One drink equals 12 oz of beer, 5 oz of wine, or 1 oz of hard liquor. Do not use any products that contain nicotine or tobacco, such as cigarettes and e-cigarettes. These can delay healing. If you need help quitting, ask your health care provider. Eat a healthy diet and maintain a healthy weight. Poor diet and excess weight may make pain worse. Eat foods that are high in fiber. These include fresh fruits and vegetables, whole grains, and beans. Limit foods that are high in fat and processed sugars, such as fried and sweet foods. Exercise regularly. Exercise lowers stress and may help relieve pain. Ask your  health care provider what activities and exercises are safe for you. If your health care provider approves, join an exercise class that combines movement and stress reduction. Examples include yoga and tai chi. Get enough sleep. Lack of sleep may make pain worse. Lower stress as much as possible. Practice stress reduction techniques as told by your therapist.  General instructions Work with all your pain management providers to find the treatments that work best for you. You are an important member of your pain management team. There are many things you can do to reduce pain on your own. Consider joining an online or in-person support group for people who have chronic pain. Keep all follow-up visits as told by your health care providers. This is important. Where to find more information You can find more information about managing pain without opioids from: American Academy of Pain Medicine: painmed.[place] for Chronic Pain: instituteforchronicpain.org American Chronic Pain Association: theacpa.org Contact a health care provider if: You have side effects from pain medicine. Your pain gets worse or does not get  better with treatments or home care. You are struggling with anxiety or depression. Summary Many types of pain can be managed without opioids. Chronic pain may respond better to pain management without opioids. Pain is best managed with a team of providers working together. Pain management without opioids may include non-opioid medicines, medical treatments, physical therapy, mental health therapy, and lifestyle changes. Tell your health care providers if your pain gets worse or is not being managed well enough. This information is not intended to replace advice given to you by your health care provider. Make sure you discuss any questions you have with your healthcare provider. Document Revised: 12/20/2019 Document Reviewed: 12/22/2019 Elsevier Patient Education  [place].

## 2020-12-04 DIAGNOSIS — M5136 Other intervertebral disc degeneration, lumbar region: Secondary | ICD-10-CM | POA: Insufficient documentation

## 2020-12-15 ENCOUNTER — Other Ambulatory Visit: Payer: Self-pay | Admitting: Family

## 2020-12-15 DIAGNOSIS — F419 Anxiety disorder, unspecified: Secondary | ICD-10-CM

## 2020-12-21 ENCOUNTER — Ambulatory Visit: Payer: Medicare Other | Admitting: Family

## 2020-12-24 ENCOUNTER — Ambulatory Visit: Payer: Medicare Other | Admitting: Family

## 2021-01-01 ENCOUNTER — Ambulatory Visit: Payer: Medicare Other | Admitting: Family Medicine

## 2021-01-01 ENCOUNTER — Other Ambulatory Visit: Payer: Self-pay

## 2021-01-01 ENCOUNTER — Ambulatory Visit (INDEPENDENT_AMBULATORY_CARE_PROVIDER_SITE_OTHER): Payer: Medicare Other | Admitting: Family Medicine

## 2021-01-01 ENCOUNTER — Encounter: Payer: Self-pay | Admitting: Family Medicine

## 2021-01-01 VITALS — BP 142/70 | HR 59 | Temp 97.8°F | Ht 69.0 in | Wt 285.0 lb

## 2021-01-01 DIAGNOSIS — I10 Essential (primary) hypertension: Secondary | ICD-10-CM | POA: Diagnosis not present

## 2021-01-01 MED ORDER — LISINOPRIL 20 MG PO TABS: 20.0000 mg | ORAL_TABLET | Freq: Every day | ORAL | 3 refills | Status: DC

## 2021-01-01 NOTE — Progress Notes (Signed)
Subjective:  Patient ID: Marcus Carr, male    DOB: 1940-09-27, 80 y.o.   MRN: [id number]  Patient Care Team: Sharion Balloon, FNP as PCP - General (Nurse Practitioner) Arnetha Courser (Inactive) as Surgeon Verlin Grills, MD as Attending Physician (Infectious Diseases)   Chief Complaint:  blood pressure fluctuation (Recliner- hypertension/Sitting normal- slightly higher)   HPI: Marcus Carr is a 80 y.o. male presenting on 01/01/2021 for blood pressure fluctuation (Recliner- hypertension/Sitting normal- slightly higher)   Pt presents today with concerns of blood pressure fluctuations. States he has been checking his blood pressure several times per day. Noticed it was elevated so he stopped taking his lisinopril. He states readings vary according to where he is sitting, BP higher when in recliner. He denies headaches, dizziness, chest pain, palpitations, orthostasis, leg swelling, shortness of breath, focal neurological deficits, or syncope.      Relevant past medical, surgical, family, and social history reviewed and updated as indicated.  Allergies and medications reviewed and updated. Data reviewed: Chart in Epic.   Past Medical History:  Diagnosis Date   Abscess, jaw    Anxiety    Back pain    chronic back pain treated by Dr. Nelva Bush   Chronic kidney disease    Chronic mastoiditis of both sides 11/07/2014   Cigarette smoker 11/07/2014   Gout    Hyperlipidemia    Hypertension    Morbid obesity ([place]) 11/07/2014   Osteomyelitis of mandible 11/07/2014   Poor dentition 11/07/2014   Thyroid nodule 11/07/2014    Past Surgical History:  Procedure Laterality Date   APPENDECTOMY     TONSILLECTOMY      Social History   Socioeconomic History   Marital status: Married    Spouse name: Rosa   Number of children: 2   Years of education: 8   Highest education level: 8th grade  Occupational History   Occupation: Retired  Tobacco Use   Smoking status:  Former    Packs/day: 1.50    Years: 50.00    Pack years: 75.00    Types: Cigarettes    Quit date: 10/24/2014    Years since quitting: 6.1   Smokeless tobacco: Never  Vaping Use   Vaping Use: Never used  Substance and Sexual Activity   Alcohol use: No    Alcohol/week: 0.0 standard drinks   Drug use: No   Sexual activity: Not on file  Other Topics Concern   Not on file  Social History Narrative   Lives wife and grandson stays with them a lot.  Two daughters - both live within 23 miles   Social Determinants of Health   Financial Resource Strain: Low Risk    Difficulty of Paying Living Expenses: Not hard at all  Food Insecurity: No Food Insecurity   Worried About Charity fundraiser in the Last Year: Never true   Arboriculturist in the Last Year: Never true  Transportation Needs: No Transportation Needs   Lack of Transportation (Medical): No   Lack of Transportation (Non-Medical): No  Physical Activity: Sufficiently Active   Days of Exercise per Week: 5 days   Minutes of Exercise per Session: 30 min  Stress: No Stress Concern Present   Feeling of Stress : Not at all  Social Connections: Moderately Isolated   Frequency of Communication with Friends and Family: More than three times a week   Frequency of Social Gatherings with Friends and Family: More than three times  a week   Attends Religious Services: Never   Active Member of Clubs or Organizations: No   Attends Archivist Meetings: Never   Marital Status: Married  Human resources officer Violence: Not At Risk   Fear of Current or Ex-Partner: No   Emotionally Abused: No   Physically Abused: No   Sexually Abused: No    Outpatient Encounter Medications as of 01/01/2021  Medication Sig   albuterol (VENTOLIN HFA) 108 (90 Base) MCG/ACT inhaler INHALE 2 PUFFS INTO THE LUNGS EVERY 6 HOURS AS NEEDED FOR WHEEZE OR SHORTNESS OF BREATH   aspirin 325 MG tablet Take 325 mg by mouth daily.   atorvastatin (LIPITOR) 20 MG tablet  Take 1 tablet (20 mg total) by mouth daily.   cholecalciferol (VITAMIN D) 400 UNITS TABS tablet Take 800 Units by mouth daily.   citalopram (CELEXA) 20 MG tablet TAKE 1 TABLET BY MOUTH EVERY DAY   diclofenac sodium (VOLTAREN) 1 % GEL Apply 4 g topically 4 (four) times daily. To left knee for pain   naproxen (NAPROSYN) 500 MG tablet    oxyCODONE-acetaminophen (PERCOCET) 10-325 MG per tablet Take 1 tablet by mouth every 12 (twelve) hours as needed.   Specialty Vitamins Products (MAGNESIUM, AMINO ACID CHELATE,) 133 MG tablet Take 1 tablet by mouth daily.   terazosin (HYTRIN) 2 MG capsule Take 1 capsule (2 mg total) by mouth at bedtime.   verapamil (CALAN-SR) 180 MG CR tablet TAKE 1 TABLET BY MOUTH  DAILY   fish oil-omega-3 fatty acids 1000 MG capsule Take 1 g by mouth 2 (two) times daily.    lisinopril (ZESTRIL) 20 MG tablet Take 1 tablet (20 mg total) by mouth daily.   [DISCONTINUED] lisinopril (ZESTRIL) 20 MG tablet Take 1 tablet (20 mg total) by mouth daily. (Patient not taking: Reported on 01/01/2021)   No facility-administered encounter medications on file as of 01/01/2021.    Allergies  Allergen Reactions   Penicillins Rash    Review of Systems  Constitutional:  Negative for activity change, appetite change, chills, diaphoresis, fatigue, fever and unexpected weight change.  HENT: Negative.    Eyes: Negative.  Negative for photophobia and visual disturbance.  Respiratory:  Negative for cough, chest tightness and shortness of breath.   Cardiovascular:  Negative for chest pain, palpitations and leg swelling.  Gastrointestinal:  Negative for abdominal pain, blood in stool, constipation, diarrhea, nausea and vomiting.  Endocrine: Negative.   Genitourinary:  Negative for decreased urine volume, difficulty urinating, dysuria, frequency and urgency.  Musculoskeletal:  Negative for arthralgias and myalgias.  Skin: Negative.   Allergic/Immunologic: Negative.   Neurological:  Negative for  dizziness, tremors, seizures, syncope, facial asymmetry, speech difficulty, weakness, light-headedness, numbness and headaches.  Hematological: Negative.   Psychiatric/Behavioral:  Negative for confusion, hallucinations, sleep disturbance and suicidal ideas.   All other systems reviewed and are negative.      Objective:  BP (!) 142/70   Pulse (!) 59   Temp 97.8 F (36.6 C)   Ht _0  (1.753 m)   Wt 285 lb (129.3 kg)   SpO2 96%   BMI 42.09 kg/m    Wt Readings from Last 3 Encounters:  01/01/21 285 lb (129.3 kg)  11/15/20 286 lb (129.7 kg)  10/30/20 286 lb (129.7 kg)    Physical Exam Vitals and nursing note reviewed.  Constitutional:      General: He is not in acute distress.    Appearance: Normal appearance. He is well-developed and well-groomed. He is obese.  He is not ill-appearing, toxic-appearing or diaphoretic.  HENT:     Head: Normocephalic and atraumatic.     Jaw: There is normal jaw occlusion.     Right Ear: Hearing normal.     Left Ear: Hearing normal.     Nose: Nose normal.     Mouth/Throat:     Lips: Pink.     Mouth: Mucous membranes are moist.     Pharynx: Oropharynx is clear. Uvula midline.  Eyes:     General: Lids are normal.     Extraocular Movements: Extraocular movements intact.     Conjunctiva/sclera: Conjunctivae normal.     Pupils: Pupils are equal, round, and reactive to light.  Neck:     Thyroid: No thyroid mass, thyromegaly or thyroid tenderness.     Vascular: No carotid bruit or JVD.     Trachea: Trachea and phonation normal.  Cardiovascular:     Rate and Rhythm: Normal rate and regular rhythm.     Chest Wall: PMI is not displaced.     Pulses: Normal pulses.     Heart sounds: Normal heart sounds. No murmur heard.   No friction rub. No gallop.  Pulmonary:     Effort: Pulmonary effort is normal. No respiratory distress.     Breath sounds: Normal breath sounds. No wheezing.  Abdominal:     General: Bowel sounds are normal. There is no  distension or abdominal bruit.     Palpations: Abdomen is soft. There is no hepatomegaly or splenomegaly.     Tenderness: There is no abdominal tenderness. There is no right CVA tenderness or left CVA tenderness.     Hernia: No hernia is present.  Musculoskeletal:        General: Normal range of motion.     Cervical back: Normal range of motion and neck supple.     Right lower leg: No edema.     Left lower leg: No edema.  Lymphadenopathy:     Cervical: No cervical adenopathy.  Skin:    General: Skin is warm and dry.     Capillary Refill: Capillary refill takes less than 2 seconds.     Coloration: Skin is not cyanotic, jaundiced or pale.     Findings: No rash.  Neurological:     General: No focal deficit present.     Mental Status: He is alert and oriented to person, place, and time.     Cranial Nerves: Cranial nerves are intact. No cranial nerve deficit.     Sensory: Sensation is intact. No sensory deficit.     Motor: Motor function is intact. No weakness.     Coordination: Coordination is intact. Coordination normal.     Gait: Gait is intact. Gait normal.     Deep Tendon Reflexes: Reflexes are normal and symmetric. Reflexes normal.  Psychiatric:        Attention and Perception: Attention and perception normal.        Mood and Affect: Mood and affect normal.        Speech: Speech normal.        Behavior: Behavior normal. Behavior is cooperative.        Thought Content: Thought content normal.        Cognition and Memory: Cognition and memory normal.        Judgment: Judgment normal.    Results for orders placed or performed in visit on 10/30/20  BMP8+EGFR  Result Value Ref Range   Glucose 104 (H) 65 -  99 mg/dL   BUN 21 8 - 27 mg/dL   Creatinine, Ser 1.48 (H) 0.76 - 1.27 mg/dL   eGFR 48 (L) >59 mL/min/1.73   BUN/Creatinine Ratio 14 10 - 24   Sodium 145 (H) 134 - 144 mmol/L   Potassium 5.2 3.5 - 5.2 mmol/L   Chloride 104 96 - 106 mmol/L   CO2 24 20 - 29 mmol/L   Calcium  9.1 8.6 - 10.2 mg/dL       Pertinent labs & imaging results that were available during my care of the patient were reviewed by me and considered in my medical decision making.  Assessment & Plan:  Marcus Carr was seen today for blood pressure fluctuation.  Diagnoses and all orders for this visit:  Primary hypertension Pt has been checking BP multiple times a day and has noted fluctuations in BP and HR. Denies hypotension or hypertension symptoms. Pt aware to restart lisinopril as previously prescribed. Pt to monitor BP twice daily unless symptomatic and follow up in 2 weeks for reevaluation. Pt aware to report any persistent high or low readings. Will check renal function today. Extensive education provided on BP. -     lisinopril (ZESTRIL) 20 MG tablet; Take 1 tablet (20 mg total) by mouth daily. -     BMP8+EGFR     Continue all other maintenance medications.  Follow up plan: Return in about 2 weeks (around 01/15/2021), or if symptoms worsen or fail to improve, for BP.   Continue healthy lifestyle choices, including diet (rich in fruits, vegetables, and lean proteins, and low in salt and simple carbohydrates) and exercise (at least 30 minutes of moderate physical activity daily).  Educational handout given for BP log  The above assessment and management plan was discussed with the patient. The patient verbalized understanding of and has agreed to the management plan. Patient is aware to call the clinic if they develop any new symptoms or if symptoms persist or worsen. Patient is aware when to return to the clinic for a follow-up visit. Patient educated on when it is appropriate to go to the emergency department.   Monia Pouch, FNP-C [facility] [phone]

## 2021-01-02 LAB — BMP8+EGFR
BUN/Creatinine Ratio: 13 (ref 10–24)
BUN: 22 mg/dL (ref 8–27)
CO2: 26 mmol/L (ref 20–29)
Calcium: 9.2 mg/dL (ref 8.6–10.2)
Chloride: 99 mmol/L (ref 96–106)
Creatinine, Ser: 1.72 mg/dL — ABNORMAL HIGH (ref 0.76–1.27)
Glucose: 112 mg/dL — ABNORMAL HIGH (ref 70–99)
Potassium: 4.8 mmol/L (ref 3.5–5.2)
Sodium: 140 mmol/L (ref 134–144)
eGFR: 40 mL/min/{1.73_m2} — ABNORMAL LOW (ref >59)

## 2021-01-03 ENCOUNTER — Ambulatory Visit: Payer: Medicare Other | Admitting: Family Medicine

## 2021-01-04 ENCOUNTER — Other Ambulatory Visit: Payer: Self-pay | Admitting: Family

## 2021-01-04 DIAGNOSIS — I10 Essential (primary) hypertension: Secondary | ICD-10-CM

## 2021-01-04 DIAGNOSIS — N400 Enlarged prostate without lower urinary tract symptoms: Secondary | ICD-10-CM

## 2021-01-12 ENCOUNTER — Other Ambulatory Visit: Payer: Self-pay | Admitting: Family

## 2021-01-12 DIAGNOSIS — N400 Enlarged prostate without lower urinary tract symptoms: Secondary | ICD-10-CM

## 2021-01-12 DIAGNOSIS — I10 Essential (primary) hypertension: Secondary | ICD-10-CM

## 2021-01-14 DIAGNOSIS — G894 Chronic pain syndrome: Secondary | ICD-10-CM | POA: Diagnosis not present

## 2021-01-14 DIAGNOSIS — M545 Low back pain, unspecified: Secondary | ICD-10-CM | POA: Diagnosis not present

## 2021-01-14 DIAGNOSIS — M5416 Radiculopathy, lumbar region: Secondary | ICD-10-CM | POA: Diagnosis not present

## 2021-01-14 DIAGNOSIS — M25562 Pain in left knee: Secondary | ICD-10-CM | POA: Diagnosis not present

## 2021-01-14 DIAGNOSIS — Z79891 Long term (current) use of opiate analgesic: Secondary | ICD-10-CM | POA: Diagnosis not present

## 2021-01-15 ENCOUNTER — Other Ambulatory Visit: Payer: Self-pay

## 2021-01-15 ENCOUNTER — Encounter: Payer: Self-pay | Admitting: Family

## 2021-01-15 ENCOUNTER — Ambulatory Visit (INDEPENDENT_AMBULATORY_CARE_PROVIDER_SITE_OTHER): Payer: Medicare Other | Admitting: Family

## 2021-01-15 VITALS — BP 131/79 | HR 73 | Temp 97.0°F | Ht 69.0 in | Wt 281.0 lb

## 2021-01-15 DIAGNOSIS — M5136 Other intervertebral disc degeneration, lumbar region: Secondary | ICD-10-CM | POA: Diagnosis not present

## 2021-01-15 DIAGNOSIS — M549 Dorsalgia, unspecified: Secondary | ICD-10-CM

## 2021-01-15 DIAGNOSIS — I10 Essential (primary) hypertension: Secondary | ICD-10-CM | POA: Diagnosis not present

## 2021-01-15 DIAGNOSIS — E785 Hyperlipidemia, unspecified: Secondary | ICD-10-CM

## 2021-01-15 DIAGNOSIS — G8929 Other chronic pain: Secondary | ICD-10-CM | POA: Diagnosis not present

## 2021-01-15 DIAGNOSIS — N183 Chronic kidney disease, stage 3 unspecified: Secondary | ICD-10-CM

## 2021-01-15 DIAGNOSIS — Z23 Encounter for immunization: Secondary | ICD-10-CM

## 2021-01-15 DIAGNOSIS — F419 Anxiety disorder, unspecified: Secondary | ICD-10-CM

## 2021-01-15 NOTE — Progress Notes (Signed)
Subjective:    Patient ID: Marcus Carr, male    DOB: 01/14/1941, 80 y.o.   MRN: [id number]  Chief Complaint  Patient presents with   Medical Management of Chronic Issues   Pt presents to the office today for chronic follow up. PT has history of osteomyelitis of mandible, doing stable at this time. Pt is followed by Pain Management every 3 months for chronic back pain. Is scheduled for an injection in the near future.   He has CKD and tries to limit NSAID's.  Hypertension This is a chronic problem. The current episode started more than 1 year ago. The problem has been resolved since onset. The problem is controlled. Associated symptoms include anxiety and malaise/fatigue. Pertinent negatives include no peripheral edema or shortness of breath. Risk factors for coronary artery disease include dyslipidemia, obesity and male gender. The current treatment provides moderate improvement.  Back Pain This is a chronic problem. The current episode started more than 1 year ago. The problem occurs intermittently. The problem has been waxing and waning since onset. The pain is present in the lumbar spine. The quality of the pain is described as aching. The pain is at a severity of 4/10. The pain is moderate.  Hyperlipidemia This is a chronic problem. The current episode started more than 1 year ago. The problem is controlled. Recent lipid tests were reviewed and are normal. Exacerbating diseases include obesity. Pertinent negatives include no shortness of breath. Current antihyperlipidemic treatment includes statins. The current treatment provides moderate improvement of lipids. Risk factors for coronary artery disease include dyslipidemia, diabetes mellitus, hypertension, male sex and a sedentary lifestyle.  Anxiety Presents for follow-up visit. Symptoms include depressed mood, excessive worry, irritability and nervous/anxious behavior. Patient reports no shortness of breath. Symptoms occur most days.  The severity of symptoms is moderate. The quality of sleep is good.       Review of Systems  Constitutional:  Positive for irritability and malaise/fatigue.  Respiratory:  Negative for shortness of breath.   Musculoskeletal:  Positive for back pain.  Psychiatric/Behavioral:  The patient is nervous/anxious.   All other systems reviewed and are negative.     Objective:   Physical Exam Vitals reviewed.  Constitutional:      General: He is not in acute distress.    Appearance: He is well-developed. He is obese.  HENT:     Head: Normocephalic.     Right Ear: Tympanic membrane normal.     Left Ear: Tympanic membrane normal.  Eyes:     General:        Right eye: No discharge.        Left eye: No discharge.     Pupils: Pupils are equal, round, and reactive to light.  Neck:     Thyroid: No thyromegaly.  Cardiovascular:     Rate and Rhythm: Normal rate and regular rhythm.     Heart sounds: Normal heart sounds. No murmur heard. Pulmonary:     Effort: Pulmonary effort is normal. No respiratory distress.     Breath sounds: Normal breath sounds. No wheezing.  Abdominal:     General: Bowel sounds are normal. There is no distension.     Palpations: Abdomen is soft.     Tenderness: There is no abdominal tenderness.  Musculoskeletal:        General: No tenderness. Normal range of motion.     Cervical back: Normal range of motion and neck supple.  Skin:    General: Skin  is warm and dry.     Findings: No erythema or rash.  Neurological:     Mental Status: He is alert and oriented to person, place, and time.     Cranial Nerves: No cranial nerve deficit.     Deep Tendon Reflexes: Reflexes are normal and symmetric.  Psychiatric:        Behavior: Behavior normal.        Thought Content: Thought content normal.        Judgment: Judgment normal.         BP 131/79   Pulse 73   Temp (!) 97 F (36.1 C) (Temporal)   Ht 5\' 9"  (1.753 m)   Wt 281 lb (127.5 kg)   BMI 41.50 kg/m    Assessment & Plan:  Marcus Carr comes in today with chief complaint of Medical Management of Chronic Issues   Diagnosis and orders addressed:  1. Primary hypertension  2. Degeneration of lumbar intervertebral disc  3. Stage 3 chronic kidney disease, unspecified whether stage 3a or 3b CKD ([place])  4. Morbid obesity ([place])  5. Hyperlipidemia, unspecified hyperlipidemia type  6. Anxiety  7. Chronic bilateral back pain, unspecified back location   Labs pending Health Maintenance reviewed Diet and exercise encouraged  Follow up plan: 6 months   [name], FNP

## 2021-01-15 NOTE — Patient Instructions (Signed)
Health Maintenance After Age 79 After age 79, you are at a higher risk for certain long-term diseases and infections as well as injuries from falls. Falls are a major cause of broken bones and head injuries in people who are older than age 79. Getting regular preventive care can help to keep you healthy and well. Preventive care includes getting regular testing and making lifestyle changes as recommended by your health care provider. Talk with your health care provider about: Which screenings and tests you should have. A screening is a test that checks for a disease when you have no symptoms. A diet and exercise plan that is right for you. What should I know about screenings and tests to prevent falls? Screening and testing are the best ways to find a health problem early. Early diagnosis and treatment give you the best chance of managing medical conditions that are common after age 79. Certain conditions and lifestyle choices may make you more likely to have a fall. Your health care provider may recommend: Regular vision checks. Poor vision and conditions such as cataracts can make you more likely to have a fall. If you wear glasses, make sure to get your prescription updated if your vision changes. Medicine review. Work with your health care provider to regularly review all of the medicines you are taking, including over-the-counter medicines. Ask your health care provider about any side effects that may make you more likely to have a fall. Tell your health care provider if any medicines that you take make you feel dizzy or sleepy. Osteoporosis screening. Osteoporosis is a condition that causes the bones to get weaker. This can make the bones weak and cause them to break more easily. Blood pressure screening. Blood pressure changes and medicines to control blood pressure can make you feel dizzy. Strength and balance checks. Your health care provider may recommend certain tests to check your strength and  balance while standing, walking, or changing positions. Foot health exam. Foot pain and numbness, as well as not wearing proper footwear, can make you more likely to have a fall. Depression screening. You may be more likely to have a fall if you have a fear of falling, feel emotionally low, or feel unable to do activities that you used to do. Alcohol use screening. Using too much alcohol can affect your balance and may make you more likely to have a fall. What actions can I take to lower my risk of falls? General instructions Talk with your health care provider about your risks for falling. Tell your health care provider if: You fall. Be sure to tell your health care provider about all falls, even ones that seem minor. You feel dizzy, sleepy, or off-balance. Take over-the-counter and prescription medicines only as told by your health care provider. These include any supplements. Eat a healthy diet and maintain a healthy weight. A healthy diet includes low-fat dairy products, low-fat (lean) meats, and fiber from whole grains, beans, and lots of fruits and vegetables. Home safety Remove any tripping hazards, such as rugs, cords, and clutter. Install safety equipment such as grab bars in bathrooms and safety rails on stairs. Keep rooms and walkways well-lit. Activity  Follow a regular exercise program to stay fit. This will help you maintain your balance. Ask your health care provider what types of exercise are appropriate for you. If you need a cane or walker, use it as recommended by your health care provider. Wear supportive shoes that have nonskid soles. Lifestyle Do not   drink alcohol if your health care provider tells you not to drink. If you drink alcohol, limit how much you have: 0-1 drink a day for women. 0-2 drinks a day for men. Be aware of how much alcohol is in your drink. In the [place] one drink equals one typical bottle of beer (12 oz), one-half glass of wine (5 oz), or one shot of  hard liquor (1 oz). Do not use any products that contain nicotine or tobacco, such as cigarettes and e-cigarettes. If you need help quitting, ask your health care provider. Summary Having a healthy lifestyle and getting preventive care can help to protect your health and wellness after age 79. Screening and testing are the best way to find a health problem early and help you avoid having a fall. Early diagnosis and treatment give you the best chance for managing medical conditions that are more common for people who are older than age 79. Falls are a major cause of broken bones and head injuries in people who are older than age 79. Take precautions to prevent a fall at home. Work with your health care provider to learn what changes you can make to improve your health and wellness and to prevent falls. This information is not intended to replace advice given to you by your health care provider. Make sure you discuss any questions you have with your health care provider. Document Revised: 05/18/2020 Document Reviewed: 02/24/2020 Elsevier Patient Education  2022 [employer]  

## 2021-01-16 LAB — CBC WITH DIFFERENTIAL/PLATELET
Basophils Absolute: 0 10*3/uL (ref 0.0–0.2)
Basos: 0 %
EOS (ABSOLUTE): 0.1 10*3/uL (ref 0.0–0.4)
Eos: 2 %
Hematocrit: 44.1 % (ref 37.5–51.0)
Hemoglobin: 14.4 g/dL (ref 13.0–17.7)
Immature Grans (Abs): 0 10*3/uL (ref 0.0–0.1)
Immature Granulocytes: 0 %
Lymphocytes Absolute: 1.5 10*3/uL (ref 0.7–3.1)
Lymphs: 24 %
MCH: 28.9 pg (ref 26.6–33.0)
MCHC: 32.7 g/dL (ref 31.5–35.7)
MCV: 89 fL (ref 79–97)
Monocytes Absolute: 0.5 10*3/uL (ref 0.1–0.9)
Monocytes: 8 %
Neutrophils Absolute: 4.1 10*3/uL (ref 1.4–7.0)
Neutrophils: 66 %
Platelets: 117 10*3/uL — ABNORMAL LOW (ref 150–450)
RBC: 4.98 x10E6/uL (ref 4.14–5.80)
RDW: 14.2 % (ref 11.6–15.4)
WBC: 6.3 10*3/uL (ref 3.4–10.8)

## 2021-01-16 LAB — CMP14+EGFR
ALT: 15 IU/L (ref 0–44)
AST: 14 IU/L (ref 0–40)
Albumin/Globulin Ratio: 1.6 (ref 1.2–2.2)
Albumin: 4.1 g/dL (ref 3.7–4.7)
Alkaline Phosphatase: 85 IU/L (ref 44–121)
BUN/Creatinine Ratio: 19 (ref 10–24)
BUN: 29 mg/dL — ABNORMAL HIGH (ref 8–27)
Bilirubin Total: 0.4 mg/dL (ref 0.0–1.2)
CO2: 27 mmol/L (ref 20–29)
Calcium: 9.4 mg/dL (ref 8.6–10.2)
Chloride: 101 mmol/L (ref 96–106)
Creatinine, Ser: 1.52 mg/dL — ABNORMAL HIGH (ref 0.76–1.27)
Globulin, Total: 2.5 g/dL (ref 1.5–4.5)
Glucose: 101 mg/dL — ABNORMAL HIGH (ref 70–99)
Potassium: 4.5 mmol/L (ref 3.5–5.2)
Sodium: 142 mmol/L (ref 134–144)
Total Protein: 6.6 g/dL (ref 6.0–8.5)
eGFR: 47 mL/min/{1.73_m2} — ABNORMAL LOW (ref >59)

## 2021-01-28 ENCOUNTER — Encounter: Payer: Self-pay | Admitting: Family

## 2021-01-28 ENCOUNTER — Ambulatory Visit (INDEPENDENT_AMBULATORY_CARE_PROVIDER_SITE_OTHER): Payer: Medicare Other | Admitting: Family

## 2021-01-28 DIAGNOSIS — E785 Hyperlipidemia, unspecified: Secondary | ICD-10-CM | POA: Diagnosis not present

## 2021-01-28 DIAGNOSIS — I10 Essential (primary) hypertension: Secondary | ICD-10-CM

## 2021-01-28 DIAGNOSIS — J209 Acute bronchitis, unspecified: Secondary | ICD-10-CM

## 2021-01-28 MED ORDER — FLUTICASONE PROPIONATE 50 MCG/ACT NA SUSP: 2.0000 | Freq: Every day | NASAL | 6 refills | Status: DC

## 2021-01-28 MED ORDER — CETIRIZINE HCL 10 MG PO TABS: 10.0000 mg | ORAL_TABLET | Freq: Every day | ORAL | 11 refills | Status: AC

## 2021-01-28 MED ORDER — PREDNISONE 10 MG (21) PO TBPK: ORAL_TABLET | ORAL | 0 refills | Status: DC

## 2021-01-28 NOTE — Progress Notes (Signed)
Virtual Visit  Note Due to COVID-19 pandemic this visit was conducted virtually. This visit type was conducted due to national recommendations for restrictions regarding the COVID-19 Pandemic (e.g. social distancing, sheltering in place) in an effort to limit this patient's exposure and mitigate transmission in our community. All issues noted in this document were discussed and addressed.  A physical exam was not performed with this format.  I connected with Marcus Carr on 01/28/21 at 2:30 pm  by telephone and verified that I am speaking with the correct person using two identifiers. Marcus Carr is currently located at home and no one is currently with him during visit. The provider, [name], FNP is located in their office at time of visit.  I discussed the limitations, risks, security and privacy concerns of performing an evaluation and management service by telephone and the availability of in person appointments. I also discussed with the patient that there may be a patient responsible charge related to this service. The patient expressed understanding and agreed to proceed.  Mr. kemal, amores are scheduled for a virtual visit with your provider today.    Just as we do with appointments in the office, we must obtain your consent to participate.  Your consent will be active for this visit and any virtual visit you may have with one of our providers in the next 365 days.    If you have a MyChart account, I can also send a copy of this consent to you electronically.  All virtual visits are billed to your insurance company just like a traditional visit in the office.  As this is a virtual visit, video technology does not allow for your provider to perform a traditional examination.  This may limit your provider's ability to fully assess your condition.  If your provider identifies any concerns that need to be evaluated in person or the need to arrange testing such as labs, EKG, etc, we  will make arrangements to do so.    Although advances in technology are sophisticated, we cannot ensure that it will always work on either your end or our end.  If the connection with a video visit is poor, we may have to switch to a telephone visit.  With either a video or telephone visit, we are not always able to ensure that we have a secure connection.   I need to obtain your verbal consent now.   Are you willing to proceed with your visit today?   [name] has provided verbal consent on 01/28/2021 for a virtual visit (video or telephone).   [name], [place] 01/28/2021  2:32 PM    History and Present Illness:  Pt calls the office today with cough that started 5 days ago. He took a home test that was negative. Requesting referral to Cardiologists for HTN and hyperlipemia.  Cough This is a new problem. The current episode started in the past 7 days. The problem has been gradually worsening. The problem occurs every few minutes. Associated symptoms include nasal congestion and postnasal drip. Pertinent negatives include no chills, ear congestion, ear pain, fever, headaches, myalgias, sore throat or wheezing. He has tried rest and OTC cough suppressant for the symptoms. The treatment provided mild relief.     Review of Systems  Constitutional:  Negative for chills and fever.  HENT:  Positive for postnasal drip. Negative for ear pain and sore throat.   Respiratory:  Positive for cough. Negative for wheezing.   Musculoskeletal:  Negative for myalgias.  Neurological:  Negative for headaches.    Observations/Objective: No SOB Or distress noted, intermittent coarse cough   Assessment and Plan: 1. Acute bronchitis, unspecified organism - Take meds as prescribed - Use a cool mist humidifier  -Use saline nose sprays frequently -Force fluids -For any cough or congestion  Use plain Mucinex- regular strength or max strength is fine -For fever or aces or pains- take tylenol or  ibuprofen. -Throat lozenges if help - predniSONE (STERAPRED UNI-PAK 21 TAB) 10 MG (21) TBPK tablet; Use as directed  Dispense: 21 tablet; Refill: 0 - cetirizine (ZYRTEC) 10 MG tablet; Take 1 tablet (10 mg total) by mouth daily.  Dispense: 30 tablet; Refill: 11 - fluticasone (FLONASE) 50 MCG/ACT nasal spray; Place 2 sprays into both nostrils daily.  Dispense: 16 g; Refill: 6  2. Primary hypertension - Ambulatory referral to Cardiology  3. Hyperlipidemia, unspecified hyperlipidemia type - Ambulatory referral to Cardiology    I discussed the assessment and treatment plan with the patient. The patient was provided an opportunity to ask questions and all were answered. The patient agreed with the plan and demonstrated an understanding of the instructions.   The patient was advised to call back or seek an in-person evaluation if the symptoms worsen or if the condition fails to improve as anticipated.  The above assessment and management plan was discussed with the patient. The patient verbalized understanding of and has agreed to the management plan. Patient is aware to call the clinic if symptoms persist or worsen. Patient is aware when to return to the clinic for a follow-up visit. Patient educated on when it is appropriate to go to the emergency department.   Time call ended:  2:42 pm   I provided 12 minutes of  non face-to-face time during this encounter.    [name], FNP

## 2021-02-18 ENCOUNTER — Other Ambulatory Visit: Payer: Self-pay | Admitting: Family

## 2021-02-18 DIAGNOSIS — E785 Hyperlipidemia, unspecified: Secondary | ICD-10-CM

## 2021-02-18 DIAGNOSIS — E8881 Metabolic syndrome: Secondary | ICD-10-CM

## 2021-02-25 ENCOUNTER — Other Ambulatory Visit: Payer: Self-pay | Admitting: Family

## 2021-02-25 DIAGNOSIS — I10 Essential (primary) hypertension: Secondary | ICD-10-CM

## 2021-03-11 ENCOUNTER — Ambulatory Visit: Payer: Medicare Other | Admitting: Family

## 2021-03-12 ENCOUNTER — Encounter: Payer: Self-pay | Admitting: Family

## 2021-03-13 ENCOUNTER — Telehealth: Payer: Self-pay | Admitting: Family

## 2021-03-13 NOTE — Telephone Encounter (Signed)
Daughter called to schedule pt a sick apt. He cannot come in due to back problems. So I suggested a televisit. Daughter wants to talk to provider about pt sxs before the call. I told her she cannot do that, provider will talk to pt during the ov tomorrow. She then asked for the provider to call tomorrow after lunch time and I explained that televisits are addressed in between pts and an apt time cannot be given.  Daughter than ask for a nurse to call to go over pt sxs because pt would not go over everything with the provider. Please call back

## 2021-03-13 NOTE — Telephone Encounter (Signed)
CANNOT CALL AND DISCUSS WITH DAUGHTER, WE HAVE NO RELEASE TO SPEAK TO HER.

## 2021-03-14 ENCOUNTER — Ambulatory Visit (INDEPENDENT_AMBULATORY_CARE_PROVIDER_SITE_OTHER): Payer: Medicare Other | Admitting: Family Medicine

## 2021-03-14 ENCOUNTER — Encounter: Payer: Self-pay | Admitting: Family Medicine

## 2021-03-14 DIAGNOSIS — B9689 Other specified bacterial agents as the cause of diseases classified elsewhere: Secondary | ICD-10-CM | POA: Diagnosis not present

## 2021-03-14 DIAGNOSIS — J988 Other specified respiratory disorders: Secondary | ICD-10-CM | POA: Diagnosis not present

## 2021-03-14 MED ORDER — METHYLPREDNISOLONE 4 MG PO TBPK: ORAL_TABLET | ORAL | 0 refills | Status: DC

## 2021-03-14 MED ORDER — AZITHROMYCIN 250 MG PO TABS: ORAL_TABLET | ORAL | 0 refills | Status: DC

## 2021-03-14 NOTE — Progress Notes (Signed)
Virtual Visit via Telephone Note  I connected with Marcus Carr on 03/14/21 at 10:31 AM by telephone and verified that I am speaking with the correct person using two identifiers. Marcus Carr is currently located at home and his wife is currently with him during this visit. The provider, Loman Brooklyn, FNP is located in their office at time of visit.  I discussed the limitations, risks, security and privacy concerns of performing an evaluation and management service by telephone and the availability of in person appointments. I also discussed with the patient that there may be a patient responsible charge related to this service. The patient expressed understanding and agreed to proceed.  Subjective: PCP: Sharion Balloon, FNP  Chief Complaint  Patient presents with   Cough   Patient complains of head/chest congestion, runny nose, sneezing, postnasal drainage, shortness of breath, and wheezing. Onset of symptoms was 1 week ago, gradually worsening since that time. He is drinking plenty of fluids. Evaluation to date: at home COVID test was negative. Treatment to date:  Mucinex, Flonase and Albuterol . He has not been formally diagnosed with COPD, but it is suspected. He does not smoke.    ROS: Per HPI  Current Outpatient Medications:    albuterol (VENTOLIN HFA) 108 (90 Base) MCG/ACT inhaler, INHALE 2 PUFFS INTO THE LUNGS EVERY 6 HOURS AS NEEDED FOR WHEEZE OR SHORTNESS OF BREATH, Disp: 8.5 each, Rfl: 1   aspirin 325 MG tablet, Take 325 mg by mouth daily., Disp: , Rfl:    atorvastatin (LIPITOR) 20 MG tablet, TAKE 1 TABLET BY MOUTH EVERY DAY, Disp: 90 tablet, Rfl: 1   cetirizine (ZYRTEC) 10 MG tablet, Take 1 tablet (10 mg total) by mouth daily., Disp: 30 tablet, Rfl: 11   cholecalciferol (VITAMIN D) 400 UNITS TABS tablet, Take 800 Units by mouth daily., Disp: , Rfl:    citalopram (CELEXA) 20 MG tablet, TAKE 1 TABLET BY MOUTH EVERY DAY, Disp: 90 tablet, Rfl: 1   diclofenac  sodium (VOLTAREN) 1 % GEL, Apply 4 g topically 4 (four) times daily. To left knee for pain, Disp: 200 g, Rfl: 1   fish oil-omega-3 fatty acids 1000 MG capsule, Take 1 g by mouth 2 (two) times daily. , Disp: , Rfl:    fluticasone (FLONASE) 50 MCG/ACT nasal spray, Place 2 sprays into both nostrils daily., Disp: 16 g, Rfl: 6   lisinopril (ZESTRIL) 20 MG tablet, Take 1 tablet (20 mg total) by mouth daily., Disp: 90 tablet, Rfl: 3   naproxen (NAPROSYN) 500 MG tablet, , Disp: , Rfl:    oxyCODONE-acetaminophen (PERCOCET) 10-325 MG per tablet, Take 1 tablet by mouth every 12 (twelve) hours as needed., Disp: 90 tablet, Rfl: 0   predniSONE (STERAPRED UNI-PAK 21 TAB) 10 MG (21) TBPK tablet, Use as directed, Disp: 21 tablet, Rfl: 0   Specialty Vitamins Products (MAGNESIUM, AMINO ACID CHELATE,) 133 MG tablet, Take 1 tablet by mouth daily., Disp: , Rfl:    terazosin (HYTRIN) 2 MG capsule, TAKE 1 CAPSULE BY MOUTH EVERYDAY AT BEDTIME, Disp: 90 capsule, Rfl: 0   verapamil (CALAN-SR) 180 MG CR tablet, TAKE 1 TABLET BY MOUTH  DAILY, Disp: 90 tablet, Rfl: 0  Allergies  Allergen Reactions   Penicillins Rash   Past Medical History:  Diagnosis Date   Abscess, jaw    Anxiety    Back pain    chronic back pain treated by Dr. Nelva Bush   Chronic kidney disease    Chronic mastoiditis  of both sides 11/07/2014   Cigarette smoker 11/07/2014   Gout    Hyperlipidemia    Hypertension    Morbid obesity ([place]) 11/07/2014   Osteomyelitis of mandible 11/07/2014   Poor dentition 11/07/2014   Thyroid nodule 11/07/2014    Observations/Objective: A&O  No respiratory distress or wheezing audible over the phone Mood, judgement, and thought processes all WNL  Assessment and Plan: 1. Bacterial respiratory infection - azithromycin (ZITHROMAX Z-PAK) 250 MG tablet; Take 2 tablets (500 mg) PO today, then 1 tablet (250 mg) PO daily x4 days.  Dispense: 6 tablet; Refill: 0 - methylPREDNISolone (MEDROL DOSEPAK) 4 MG TBPK tablet; Use as  directed.  Dispense: 21 each; Refill: 0   Follow Up Instructions:  I discussed the assessment and treatment plan with the patient. The patient was provided an opportunity to ask questions and all were answered. The patient agreed with the plan and demonstrated an understanding of the instructions.   The patient was advised to call back or seek an in-person evaluation if the symptoms worsen or if the condition fails to improve as anticipated.  The above assessment and management plan was discussed with the patient. The patient verbalized understanding of and has agreed to the management plan. Patient is aware to call the clinic if symptoms persist or worsen. Patient is aware when to return to the clinic for a follow-up visit. Patient educated on when it is appropriate to go to the emergency department.   Time call ended: 10:42 AM  I provided 11 minutes of non-face-to-face time during this encounter.  Hendricks Limes, MSN, APRN, FNP-C [facility] 03/14/21

## 2021-03-29 ENCOUNTER — Encounter: Payer: Self-pay | Admitting: Family Medicine

## 2021-03-29 ENCOUNTER — Ambulatory Visit (INDEPENDENT_AMBULATORY_CARE_PROVIDER_SITE_OTHER): Payer: Medicare Other | Admitting: Family Medicine

## 2021-03-29 ENCOUNTER — Ambulatory Visit (INDEPENDENT_AMBULATORY_CARE_PROVIDER_SITE_OTHER): Payer: Medicare Other

## 2021-03-29 VITALS — BP 169/96 | HR 76 | Temp 98.1°F | Ht 69.0 in | Wt 294.0 lb

## 2021-03-29 DIAGNOSIS — R2243 Localized swelling, mass and lump, lower limb, bilateral: Secondary | ICD-10-CM

## 2021-03-29 DIAGNOSIS — R0601 Orthopnea: Secondary | ICD-10-CM | POA: Diagnosis not present

## 2021-03-29 DIAGNOSIS — I10 Essential (primary) hypertension: Secondary | ICD-10-CM | POA: Diagnosis not present

## 2021-03-29 DIAGNOSIS — R0602 Shortness of breath: Secondary | ICD-10-CM | POA: Diagnosis not present

## 2021-03-29 DIAGNOSIS — I700 Atherosclerosis of aorta: Secondary | ICD-10-CM | POA: Diagnosis not present

## 2021-03-29 MED ORDER — FUROSEMIDE 40 MG PO TABS: 40.0000 mg | ORAL_TABLET | Freq: Every day | ORAL | 0 refills | Status: DC

## 2021-03-30 LAB — BMP8+EGFR
BUN/Creatinine Ratio: 13 (ref 10–24)
BUN: 19 mg/dL (ref 8–27)
CO2: 27 mmol/L (ref 20–29)
Calcium: 9.4 mg/dL (ref 8.6–10.2)
Chloride: 100 mmol/L (ref 96–106)
Creatinine, Ser: 1.49 mg/dL — ABNORMAL HIGH (ref 0.76–1.27)
Glucose: 103 mg/dL — ABNORMAL HIGH (ref 70–99)
Potassium: 5 mmol/L (ref 3.5–5.2)
Sodium: 140 mmol/L (ref 134–144)
eGFR: 47 mL/min/{1.73_m2} — ABNORMAL LOW (ref >59)

## 2021-03-30 LAB — BRAIN NATRIURETIC PEPTIDE: BNP: 167.5 pg/mL — ABNORMAL HIGH (ref 0.0–100.0)

## 2021-03-30 NOTE — Progress Notes (Signed)
Subjective:  Patient ID: Marcus Carr, male    DOB: 10/22/1940, 81 y.o.   MRN: [id number]  Patient Care Team: Sharion Balloon, FNP as PCP - General (Nurse Practitioner) Arnetha Courser (Inactive) as Surgeon Verlin Grills, MD as Attending Physician (Infectious Diseases)   Chief Complaint:  Foot Swelling   HPI: Marcus Carr is a 81 y.o. male presenting on 03/29/2021 for Foot Swelling   Patient presents today for evaluation of extremity swelling for 2 weeks.  Patient denies any recent long travel, illnesses, or procedures.  He does report having to sleep in a recliner due to trouble breathing when lying flat.  He denies PND.  States he does get short of breath with exertion.  No chest pain, palpitations, dizziness, weakness, fatigue, confusion, or syncope.  Denies changes in urinary output.  States he does not watch his salt intake elevate legs when sitting.  He has not weighed at home.   Relevant past medical, surgical, family, and social history reviewed and updated as indicated.  Allergies and medications reviewed and updated. Data reviewed: Chart in Epic.   Past Medical History:  Diagnosis Date   Abscess, jaw    Anxiety    Back pain    chronic back pain treated by Dr. Nelva Bush   Chronic kidney disease    Chronic mastoiditis of both sides 11/07/2014   Cigarette smoker 11/07/2014   Gout    Hyperlipidemia    Hypertension    Morbid obesity ([place]) 11/07/2014   Osteomyelitis of mandible 11/07/2014   Poor dentition 11/07/2014   Thyroid nodule 11/07/2014    Past Surgical History:  Procedure Laterality Date   APPENDECTOMY     TONSILLECTOMY      Social History   Socioeconomic History   Marital status: Married    Spouse name: Rosa   Number of children: 2   Years of education: 8   Highest education level: 8th grade  Occupational History   Occupation: Retired  Tobacco Use   Smoking status: Former    Packs/day: 1.50    Years: 50.00    Pack years: 75.00     Types: Cigarettes    Quit date: 10/24/2014    Years since quitting: 6.4   Smokeless tobacco: Never  Vaping Use   Vaping Use: Never used  Substance and Sexual Activity   Alcohol use: No    Alcohol/week: 0.0 standard drinks   Drug use: No   Sexual activity: Not on file  Other Topics Concern   Not on file  Social History Narrative   Lives wife and grandson stays with them a lot.  Two daughters - both live within 38 miles   Social Determinants of Health   Financial Resource Strain: Low Risk    Difficulty of Paying Living Expenses: Not hard at all  Food Insecurity: No Food Insecurity   Worried About Charity fundraiser in the Last Year: Never true   Arboriculturist in the Last Year: Never true  Transportation Needs: No Transportation Needs   Lack of Transportation (Medical): No   Lack of Transportation (Non-Medical): No  Physical Activity: Sufficiently Active   Days of Exercise per Week: 5 days   Minutes of Exercise per Session: 30 min  Stress: No Stress Concern Present   Feeling of Stress : Not at all  Social Connections: Moderately Isolated   Frequency of Communication with Friends and Family: More than three times a week   Frequency of  Social Gatherings with Friends and Family: More than three times a week   Attends Religious Services: Never   Marine scientist or Organizations: No   Attends Archivist Meetings: Never   Marital Status: Married  Human resources officer Violence: Not At Risk   Fear of Current or Ex-Partner: No   Emotionally Abused: No   Physically Abused: No   Sexually Abused: No    Outpatient Encounter Medications as of 03/29/2021  Medication Sig   furosemide (LASIX) 40 MG tablet Take 1 tablet (40 mg total) by mouth daily for 5 days.   albuterol (VENTOLIN HFA) 108 (90 Base) MCG/ACT inhaler INHALE 2 PUFFS INTO THE LUNGS EVERY 6 HOURS AS NEEDED FOR WHEEZE OR SHORTNESS OF BREATH   aspirin 325 MG tablet Take 325 mg by mouth daily.   atorvastatin  (LIPITOR) 20 MG tablet TAKE 1 TABLET BY MOUTH EVERY DAY   cetirizine (ZYRTEC) 10 MG tablet Take 1 tablet (10 mg total) by mouth daily.   cholecalciferol (VITAMIN D) 400 UNITS TABS tablet Take 800 Units by mouth daily.   citalopram (CELEXA) 20 MG tablet TAKE 1 TABLET BY MOUTH EVERY DAY   diclofenac sodium (VOLTAREN) 1 % GEL Apply 4 g topically 4 (four) times daily. To left knee for pain   fish oil-omega-3 fatty acids 1000 MG capsule Take 1 g by mouth 2 (two) times daily.    fluticasone (FLONASE) 50 MCG/ACT nasal spray Place 2 sprays into both nostrils daily.   lisinopril (ZESTRIL) 20 MG tablet Take 1 tablet (20 mg total) by mouth daily.   oxyCODONE-acetaminophen (PERCOCET) 10-325 MG per tablet Take 1 tablet by mouth every 12 (twelve) hours as needed.   Specialty Vitamins Products (MAGNESIUM, AMINO ACID CHELATE,) 133 MG tablet Take 1 tablet by mouth daily.   terazosin (HYTRIN) 2 MG capsule TAKE 1 CAPSULE BY MOUTH EVERYDAY AT BEDTIME   verapamil (CALAN-SR) 180 MG CR tablet TAKE 1 TABLET BY MOUTH  DAILY   [DISCONTINUED] azithromycin (ZITHROMAX Z-PAK) 250 MG tablet Take 2 tablets (500 mg) PO today, then 1 tablet (250 mg) PO daily x4 days.   [DISCONTINUED] methylPREDNISolone (MEDROL DOSEPAK) 4 MG TBPK tablet Use as directed.   [DISCONTINUED] naproxen (NAPROSYN) 500 MG tablet    No facility-administered encounter medications on file as of 03/29/2021.    Allergies  Allergen Reactions   Penicillins Rash    Review of Systems  Constitutional:  Positive for activity change. Negative for appetite change, chills, diaphoresis, fatigue, fever and unexpected weight change.  Eyes:  Negative for photophobia and visual disturbance.  Respiratory:  Positive for shortness of breath. Negative for apnea, cough, choking, chest tightness, wheezing and stridor.   Cardiovascular:  Positive for leg swelling. Negative for chest pain and palpitations.  Genitourinary:  Negative for decreased urine volume and difficulty  urinating.  Skin:  Negative for color change.  Neurological:  Negative for dizziness, tremors, seizures, syncope, facial asymmetry, speech difficulty, weakness, light-headedness, numbness and headaches.  Psychiatric/Behavioral:  Negative for confusion.   All other systems reviewed and are negative.      Objective:  BP (!) 169/96    Pulse 76    Temp 98.1 F (36.7 C)    Ht '5\' 9"'  (1.753 m)    Wt 294 lb (133.4 kg)    SpO2 93%    BMI 43.42 kg/m    Wt Readings from Last 3 Encounters:  03/29/21 294 lb (133.4 kg)  01/15/21 281 lb (127.5 kg)  01/01/21 285 lb (  129.3 kg)    Physical Exam Vitals and nursing note reviewed.  Constitutional:      General: He is not in acute distress.    Appearance: Normal appearance. He is morbidly obese. He is not ill-appearing, toxic-appearing or diaphoretic.  HENT:     Head: Normocephalic and atraumatic.     Mouth/Throat:     Mouth: Mucous membranes are moist.  Eyes:     Conjunctiva/sclera: Conjunctivae normal.     Pupils: Pupils are equal, round, and reactive to light.  Cardiovascular:     Rate and Rhythm: Normal rate and regular rhythm.     Heart sounds: No murmur heard.   No friction rub. Gallop present. S3 sounds present. No S4 sounds.  Pulmonary:     Effort: Pulmonary effort is normal.     Breath sounds: Examination of the right-lower field reveals rales. Examination of the left-lower field reveals rales. Rales present. No decreased breath sounds, wheezing or rhonchi.     Comments: Dyspnea with exertion Abdominal:     General: Bowel sounds are normal.     Palpations: Abdomen is soft.  Musculoskeletal:     Right lower leg: 3+ Edema present.     Left lower leg: 3+ Edema present.  Skin:    General: Skin is warm and dry.     Capillary Refill: Capillary refill takes less than 2 seconds.     Coloration: Skin is not pale.  Neurological:     General: No focal deficit present.     Mental Status: He is alert and oriented to person, place, and time.   Psychiatric:        Mood and Affect: Mood normal.        Behavior: Behavior normal.        Thought Content: Thought content normal.        Judgment: Judgment normal.    Results for orders placed or performed in visit on 01/15/21  CMP14+EGFR  Result Value Ref Range   Glucose 101 (H) 70 - 99 mg/dL   BUN 29 (H) 8 - 27 mg/dL   Creatinine, Ser 1.52 (H) 0.76 - 1.27 mg/dL   eGFR 47 (L) >59 mL/min/1.73   BUN/Creatinine Ratio 19 10 - 24   Sodium 142 134 - 144 mmol/L   Potassium 4.5 3.5 - 5.2 mmol/L   Chloride 101 96 - 106 mmol/L   CO2 27 20 - 29 mmol/L   Calcium 9.4 8.6 - 10.2 mg/dL   Total Protein 6.6 6.0 - 8.5 g/dL   Albumin 4.1 3.7 - 4.7 g/dL   Globulin, Total 2.5 1.5 - 4.5 g/dL   Albumin/Globulin Ratio 1.6 1.2 - 2.2   Bilirubin Total 0.4 0.0 - 1.2 mg/dL   Alkaline Phosphatase 85 44 - 121 IU/L   AST 14 0 - 40 IU/L   ALT 15 0 - 44 IU/L  CBC with Differential/Platelet  Result Value Ref Range   WBC 6.3 3.4 - 10.8 x10E3/uL   RBC 4.98 4.14 - 5.80 x10E6/uL   Hemoglobin 14.4 13.0 - 17.7 g/dL   Hematocrit 44.1 37.5 - 51.0 %   MCV 89 79 - 97 fL   MCH 28.9 26.6 - 33.0 pg   MCHC 32.7 31.5 - 35.7 g/dL   RDW 14.2 11.6 - 15.4 %   Platelets 117 (L) 150 - 450 x10E3/uL   Neutrophils 66 Not Estab. %   Lymphs 24 Not Estab. %   Monocytes 8 Not Estab. %   Eos 2 Not Estab. %  Basos 0 Not Estab. %   Neutrophils Absolute 4.1 1.4 - 7.0 x10E3/uL   Lymphocytes Absolute 1.5 0.7 - 3.1 x10E3/uL   Monocytes Absolute 0.5 0.1 - 0.9 x10E3/uL   EOS (ABSOLUTE) 0.1 0.0 - 0.4 x10E3/uL   Basophils Absolute 0.0 0.0 - 0.2 x10E3/uL   Immature Granulocytes 0 Not Estab. %   Immature Grans (Abs) 0.0 0.0 - 0.1 x10E3/uL     EKG: SR 72, PR 178 ms, QT 384 ms, no acute ST-T changes, no ectopy, no significant changes from prior EKG. Monia Pouch, FNP-C.   Pertinent labs & imaging results that were available during my care of the patient were reviewed by me and considered in my medical decision  making.  Assessment & Plan:  Garrison was seen today for foot swelling.  Diagnoses and all orders for this visit:  Localized swelling of both lower legs Orthopnea Primary hypertension Signs and symptoms concerning for new onset heart.  EKG in office without acute changes or significant changes from prior EKG.  Renal function has been declined in the past.  We will recheck BMP and BNP today.  Echo ordered.  Chest x-ray completed.  We will start Lasix 40 mg for the next 5 days.  Patient aware of red flags require emergent evaluation and treatment.  Follow-up in 1 week or sooner if warranted.  Will refer to cardiology once testing has resulted. -     Brain natriuretic peptide -     BMP8+EGFR -     DG Chest 2 View -     ECHOCARDIOGRAM COMPLETE; Future -     furosemide (LASIX) 40 MG tablet; Take 1 tablet (40 mg total) by mouth daily for 5 days. -     EKG 12-Lead    Continue all other maintenance medications.  Follow up plan: Return in about 1 week (around 04/05/2021), or if symptoms worsen or fail to improve, for edema.   Continue healthy lifestyle choices, including diet (rich in fruits, vegetables, and lean proteins, and low in salt and simple carbohydrates) and exercise (at least 30 minutes of moderate physical activity daily).  Educational handout given for edema  The above assessment and management plan was discussed with the patient. The patient verbalized understanding of and has agreed to the management plan. Patient is aware to call the clinic if they develop any new symptoms or if symptoms persist or worsen. Patient is aware when to return to the clinic for a follow-up visit. Patient educated on when it is appropriate to go to the emergency department.   Monia Pouch, FNP-C [facility] [phone]

## 2021-04-05 ENCOUNTER — Ambulatory Visit (INDEPENDENT_AMBULATORY_CARE_PROVIDER_SITE_OTHER): Payer: Medicare Other | Admitting: Family Medicine

## 2021-04-05 ENCOUNTER — Encounter: Payer: Self-pay | Admitting: Family Medicine

## 2021-04-05 VITALS — BP 114/60 | HR 57 | Temp 98.7°F | Ht 69.0 in | Wt 287.0 lb

## 2021-04-05 DIAGNOSIS — R2243 Localized swelling, mass and lump, lower limb, bilateral: Secondary | ICD-10-CM | POA: Diagnosis not present

## 2021-04-05 DIAGNOSIS — I10 Essential (primary) hypertension: Secondary | ICD-10-CM | POA: Diagnosis not present

## 2021-04-05 DIAGNOSIS — N183 Chronic kidney disease, stage 3 unspecified: Secondary | ICD-10-CM

## 2021-04-05 MED ORDER — FUROSEMIDE 40 MG PO TABS: 40.0000 mg | ORAL_TABLET | Freq: Every day | ORAL | 3 refills | Status: DC

## 2021-04-05 NOTE — Progress Notes (Signed)
Subjective:  Patient ID: Marcus Carr, male    DOB: 12/27/1940, 81 y.o.   MRN: [id number]  Patient Care Team: Sharion Balloon, FNP as PCP - General (Nurse Practitioner) Arnetha Courser (Inactive) as Surgeon Verlin Grills, MD as Attending Physician (Infectious Diseases)   Chief Complaint:  Hypertension   HPI: Marcus Carr is a 81 y.o. male presenting on 04/05/2021 for Hypertension  Patient presents today for follow up.  He was seen on 03/29/2021 for bilateral leg swelling, elevated blood pressure, and slight shortness of breath with exertion.  EKG in office with acute changes or significant changes from prior EKG.  Chest x-ray unremarkable.  Patient was started on Lasix 40 mg daily and referred to cardiology for echo and evaluation.  Labs revealed declined but stable renal function and elevated BNP.  Patient states he took the Lasix as prescribed for the 5 days and swelling has improved but not completely resolved.  States shortness of breath has improved as well.  No chest pain, palpitations, dizziness, weakness, confusion, or syncope.  He still sleeps in a recliner due to back and shoulder pain.  He denies PND.  Blood pressure is much better controlled today.  No headaches, blurred vision, or focal neurological deficits.  Relevant past medical, surgical, family, and social history reviewed and updated as indicated.  Allergies and medications reviewed and updated. Data reviewed: Chart in Epic.   Past Medical History:  Diagnosis Date   Abscess, jaw    Anxiety    Back pain    chronic back pain treated by Dr. Nelva Bush   Chronic kidney disease    Chronic mastoiditis of both sides 11/07/2014   Cigarette smoker 11/07/2014   Gout    Hyperlipidemia    Hypertension    Morbid obesity ([place]) 11/07/2014   Osteomyelitis of mandible 11/07/2014   Poor dentition 11/07/2014   Thyroid nodule 11/07/2014    Past Surgical History:  Procedure Laterality Date   APPENDECTOMY      TONSILLECTOMY      Social History   Socioeconomic History   Marital status: Married    Spouse name: Rosa   Number of children: 2   Years of education: 8   Highest education level: 8th grade  Occupational History   Occupation: Retired  Tobacco Use   Smoking status: Former    Packs/day: 1.50    Years: 50.00    Pack years: 75.00    Types: Cigarettes    Quit date: 10/24/2014    Years since quitting: 6.4   Smokeless tobacco: Never  Vaping Use   Vaping Use: Never used  Substance and Sexual Activity   Alcohol use: No    Alcohol/week: 0.0 standard drinks   Drug use: No   Sexual activity: Not on file  Other Topics Concern   Not on file  Social History Narrative   Lives wife and grandson stays with them a lot.  Two daughters - both live within 72 miles   Social Determinants of Health   Financial Resource Strain: Low Risk    Difficulty of Paying Living Expenses: Not hard at all  Food Insecurity: No Food Insecurity   Worried About Charity fundraiser in the Last Year: Never true   Arboriculturist in the Last Year: Never true  Transportation Needs: No Transportation Needs   Lack of Transportation (Medical): No   Lack of Transportation (Non-Medical): No  Physical Activity: Sufficiently Active   Days of Exercise  per Week: 5 days   Minutes of Exercise per Session: 30 min  Stress: No Stress Concern Present   Feeling of Stress : Not at all  Social Connections: Moderately Isolated   Frequency of Communication with Friends and Family: More than three times a week   Frequency of Social Gatherings with Friends and Family: More than three times a week   Attends Religious Services: Never   Marine scientist or Organizations: No   Attends Archivist Meetings: Never   Marital Status: Married  Human resources officer Violence: Not At Risk   Fear of Current or Ex-Partner: No   Emotionally Abused: No   Physically Abused: No   Sexually Abused: No    Outpatient Encounter  Medications as of 04/05/2021  Medication Sig   albuterol (VENTOLIN HFA) 108 (90 Base) MCG/ACT inhaler INHALE 2 PUFFS INTO THE LUNGS EVERY 6 HOURS AS NEEDED FOR WHEEZE OR SHORTNESS OF BREATH   aspirin 325 MG tablet Take 325 mg by mouth daily.   atorvastatin (LIPITOR) 20 MG tablet TAKE 1 TABLET BY MOUTH EVERY DAY   cetirizine (ZYRTEC) 10 MG tablet Take 1 tablet (10 mg total) by mouth daily.   cholecalciferol (VITAMIN D) 400 UNITS TABS tablet Take 800 Units by mouth daily.   citalopram (CELEXA) 20 MG tablet TAKE 1 TABLET BY MOUTH EVERY DAY   diclofenac sodium (VOLTAREN) 1 % GEL Apply 4 g topically 4 (four) times daily. To left knee for pain   fish oil-omega-3 fatty acids 1000 MG capsule Take 1 g by mouth 2 (two) times daily.    fluticasone (FLONASE) 50 MCG/ACT nasal spray Place 2 sprays into both nostrils daily.   furosemide (LASIX) 40 MG tablet Take 1 tablet (40 mg total) by mouth daily.   lisinopril (ZESTRIL) 20 MG tablet Take 1 tablet (20 mg total) by mouth daily.   oxyCODONE-acetaminophen (PERCOCET) 10-325 MG per tablet Take 1 tablet by mouth every 12 (twelve) hours as needed.   Specialty Vitamins Products (MAGNESIUM, AMINO ACID CHELATE,) 133 MG tablet Take 1 tablet by mouth daily.   terazosin (HYTRIN) 2 MG capsule TAKE 1 CAPSULE BY MOUTH EVERYDAY AT BEDTIME   verapamil (CALAN-SR) 180 MG CR tablet TAKE 1 TABLET BY MOUTH  DAILY   [DISCONTINUED] furosemide (LASIX) 40 MG tablet Take 1 tablet (40 mg total) by mouth daily for 5 days.   No facility-administered encounter medications on file as of 04/05/2021.    Allergies  Allergen Reactions   Penicillins Rash    Review of Systems  Constitutional:  Negative for activity change, appetite change, chills, diaphoresis, fatigue, fever and unexpected weight change.  HENT: Negative.    Eyes: Negative.   Respiratory:  Positive for shortness of breath (with exertion, improving). Negative for cough and chest tightness.   Cardiovascular:  Positive for  leg swelling. Negative for chest pain and palpitations.  Gastrointestinal:  Negative for abdominal pain, blood in stool, constipation, diarrhea, nausea and vomiting.  Endocrine: Negative.   Genitourinary:  Negative for decreased urine volume, difficulty urinating, dysuria, frequency and urgency.  Musculoskeletal:  Negative for arthralgias and myalgias.  Skin: Negative.   Allergic/Immunologic: Negative.   Neurological:  Negative for dizziness, tremors, seizures, syncope, facial asymmetry, speech difficulty, weakness, light-headedness, numbness and headaches.  Hematological: Negative.   Psychiatric/Behavioral:  Negative for confusion, hallucinations, sleep disturbance and suicidal ideas.   All other systems reviewed and are negative.      Objective:  BP 114/60    Pulse (!) 57  Temp 98.7 F (37.1 C)    Ht 5' 9" (1.753 m)    Wt 287 lb (130.2 kg)    SpO2 92%    BMI 42.38 kg/m    Wt Readings from Last 3 Encounters:  04/05/21 287 lb (130.2 kg)  03/29/21 294 lb (133.4 kg)  01/15/21 281 lb (127.5 kg)    Physical Exam Vitals and nursing note reviewed.  Constitutional:      General: He is not in acute distress.    Appearance: Normal appearance. He is obese. He is not ill-appearing, toxic-appearing or diaphoretic.  HENT:     Head: Normocephalic and atraumatic.     Mouth/Throat:     Mouth: Mucous membranes are moist.  Eyes:     Conjunctiva/sclera: Conjunctivae normal.     Pupils: Pupils are equal, round, and reactive to light.  Cardiovascular:     Rate and Rhythm: Normal rate and regular rhythm.     Heart sounds: No murmur heard.   No friction rub. No gallop.  Pulmonary:     Effort: Pulmonary effort is normal.     Breath sounds: Normal breath sounds. No rhonchi or rales.  Abdominal:     General: Bowel sounds are normal. There is no distension.     Palpations: Abdomen is soft.  Musculoskeletal:     Right lower leg: 1+ Pitting Edema present.     Left lower leg: 1+ Pitting Edema  present.  Skin:    General: Skin is warm.     Capillary Refill: Capillary refill takes less than 2 seconds.     Coloration: Skin is not pale.  Neurological:     General: No focal deficit present.     Mental Status: He is alert and oriented to person, place, and time.  Psychiatric:        Mood and Affect: Mood normal.        Behavior: Behavior normal.        Thought Content: Thought content normal.        Judgment: Judgment normal.    Results for orders placed or performed in visit on 03/29/21  Brain natriuretic peptide  Result Value Ref Range   BNP 167.5 (H) 0.0 - 100.0 pg/mL  BMP8+EGFR  Result Value Ref Range   Glucose 103 (H) 70 - 99 mg/dL   BUN 19 8 - 27 mg/dL   Creatinine, Ser 1.49 (H) 0.76 - 1.27 mg/dL   eGFR 47 (L) >59 mL/min/1.73   BUN/Creatinine Ratio 13 10 - 24   Sodium 140 134 - 144 mmol/L   Potassium 5.0 3.5 - 5.2 mmol/L   Chloride 100 96 - 106 mmol/L   CO2 27 20 - 29 mmol/L   Calcium 9.4 8.6 - 10.2 mg/dL       Pertinent labs & imaging results that were available during my care of the patient were reviewed by me and considered in my medical decision making.  Assessment & Plan:  [place] was seen today for hypertension.  Diagnoses and all orders for this visit:  Localized swelling of both lower legs Swelling has greatly improved but not resolved.  We will continue Lasix 40 mg daily.  Repeat BMP.  Has follow-up with cardiology scheduled.  Awaiting echo. -     BMP8+EGFR -     furosemide (LASIX) 40 MG tablet; Take 1 tablet (40 mg total) by mouth daily.  Primary hypertension Greatly improved with Lasix therapy.  We will continue.  Repeat BMP today. -  BMP8+EGFR -     furosemide (LASIX) 40 MG tablet; Take 1 tablet (40 mg total) by mouth daily.  Stage 3 chronic kidney disease, unspecified whether stage 3a or 3b CKD ([place]) Denies changes in urinary output.  Due to addition of Lasix, will repeat renal function today. -     BMP8+EGFR     Continue all other  maintenance medications.  Follow up plan: Return in about 3 months (around 07/04/2021), or if symptoms worsen or fail to improve, for with PCP.   Continue healthy lifestyle choices, including diet (rich in fruits, vegetables, and lean proteins, and low in salt and simple carbohydrates) and exercise (at least 30 minutes of moderate physical activity daily).  The above assessment and management plan was discussed with the patient. The patient verbalized understanding of and has agreed to the management plan. Patient is aware to call the clinic if they develop any new symptoms or if symptoms persist or worsen. Patient is aware when to return to the clinic for a follow-up visit. Patient educated on when it is appropriate to go to the emergency department.   Monia Pouch, FNP-C [facility] [phone]

## 2021-04-06 LAB — BMP8+EGFR
BUN/Creatinine Ratio: 21 (ref 10–24)
BUN: 38 mg/dL — ABNORMAL HIGH (ref 8–27)
CO2: 25 mmol/L (ref 20–29)
Calcium: 9.4 mg/dL (ref 8.6–10.2)
Chloride: 101 mmol/L (ref 96–106)
Creatinine, Ser: 1.84 mg/dL — ABNORMAL HIGH (ref 0.76–1.27)
Glucose: 123 mg/dL — ABNORMAL HIGH (ref 70–99)
Potassium: 5 mmol/L (ref 3.5–5.2)
Sodium: 142 mmol/L (ref 134–144)
eGFR: 37 mL/min/{1.73_m2} — ABNORMAL LOW (ref >59)

## 2021-04-08 ENCOUNTER — Other Ambulatory Visit: Payer: Self-pay | Admitting: *Deleted

## 2021-04-08 ENCOUNTER — Encounter: Payer: Self-pay | Admitting: Cardiology

## 2021-04-08 DIAGNOSIS — N183 Chronic kidney disease, stage 3 unspecified: Secondary | ICD-10-CM

## 2021-04-08 DIAGNOSIS — R9431 Abnormal electrocardiogram [ECG] [EKG]: Secondary | ICD-10-CM | POA: Insufficient documentation

## 2021-04-08 DIAGNOSIS — M7989 Other specified soft tissue disorders: Secondary | ICD-10-CM | POA: Insufficient documentation

## 2021-04-08 NOTE — Progress Notes (Signed)
Cardiology Office Note   Date:  04/10/2021   ID:  Carr, Marcus 1941-02-06, MRN [id number]  PCP:  Sharion Balloon, FNP  Cardiologist:   Minus Breeding, MD   Chief Complaint  Patient presents with   Edema      History of Present Illness: Marcus Carr is a 81 y.o. male who presents for evaluation of lower extremity edema.   I saw him in 2016.  He had syncope.   He had an abnormal EKG.  He had a negative POET (Plain Old Exercise Treadmill).     He said that he has been getting short of breath for about the past 2 weeks.  Seems to have been more with activity such as climbing [place] a [place] on his property.  He denies any chest pressure, neck or arm discomfort.  He has no palpitations, presyncope or syncope.  His weights have been relatively stable.  However, he said he has had lower extremity swelling and he did get Lasix a couple of weeks ago.  He thinks this helped.  I do see a BNP level that was very mildly elevated at 167.  He has had some renal insufficiency that is being followed closely.  He is not describing PND or orthopnea but he chronically sleeps in a recliner because of back problems.  He is limited in his activities and he is not exactly sure why but thinks it is because of just balance and general joint pains   Past Medical History:  Diagnosis Date   Abscess, jaw    Anxiety    Back pain    chronic back pain treated by Dr. Nelva Bush   Chronic kidney disease    Chronic mastoiditis of both sides 11/07/2014   Cigarette smoker 11/07/2014   COPD (chronic obstructive pulmonary disease) ([place])    Gout    Hyperlipidemia    Hypertension    Morbid obesity ([place]) 11/07/2014   Osteomyelitis of mandible 11/07/2014   Thyroid nodule 11/07/2014    Past Surgical History:  Procedure Laterality Date   APPENDECTOMY     TONSILLECTOMY       Current Outpatient Medications  Medication Sig Dispense Refill   albuterol (VENTOLIN HFA) 108 (90 Base) MCG/ACT inhaler  INHALE 2 PUFFS INTO THE LUNGS EVERY 6 HOURS AS NEEDED FOR WHEEZE OR SHORTNESS OF BREATH 8.5 each 1   aspirin 325 MG tablet Take 325 mg by mouth daily.     atorvastatin (LIPITOR) 20 MG tablet TAKE 1 TABLET BY MOUTH EVERY DAY 90 tablet 1   cetirizine (ZYRTEC) 10 MG tablet Take 1 tablet (10 mg total) by mouth daily. 30 tablet 11   cholecalciferol (VITAMIN D) 400 UNITS TABS tablet Take 800 Units by mouth daily.     citalopram (CELEXA) 20 MG tablet TAKE 1 TABLET BY MOUTH EVERY DAY 90 tablet 1   fish oil-omega-3 fatty acids 1000 MG capsule Take 1 g by mouth 2 (two) times daily.      fluticasone (FLONASE) 50 MCG/ACT nasal spray Place 2 sprays into both nostrils daily. 16 g 6   furosemide (LASIX) 40 MG tablet Take 1 tablet (40 mg total) by mouth daily. 30 tablet 3   lisinopril (ZESTRIL) 20 MG tablet Take 1 tablet (20 mg total) by mouth daily. 90 tablet 3   oxyCODONE-acetaminophen (PERCOCET) 10-325 MG per tablet Take 1 tablet by mouth every 12 (twelve) hours as needed. 90 tablet 0   Specialty Vitamins Products (MAGNESIUM, AMINO ACID  CHELATE,) 133 MG tablet Take 1 tablet by mouth daily.     terazosin (HYTRIN) 2 MG capsule TAKE 1 CAPSULE BY MOUTH EVERYDAY AT BEDTIME 90 capsule 0   verapamil (CALAN-SR) 180 MG CR tablet TAKE 1 TABLET BY MOUTH  DAILY 90 tablet 0   diclofenac sodium (VOLTAREN) 1 % GEL Apply 4 g topically 4 (four) times daily. To left knee for pain (Patient not taking: Reported on 04/10/2021) 200 g 1   No current facility-administered medications for this visit.    Allergies:   Penicillins    Social History:  The patient  reports that he quit smoking about 6 years ago. His smoking use included cigarettes. He has a 75.00 pack-year smoking history. He has never used smokeless tobacco. He reports that he does not drink alcohol and does not use drugs.   Family History:  The patient's family history includes Cancer in his mother; Heart attack (age of onset: 37) in his father.    ROS:  Please  see the history of present illness.   Otherwise, review of systems are positive for none.   All other systems are reviewed and negative.    PHYSICAL EXAM: VS:  BP 138/90 (BP Location: Left Arm, Patient Position: Sitting, Cuff Size: Normal)    Pulse 98    Ht 5\' 8"  (1.727 m)    Wt 287 lb 12.8 oz (130.5 kg)    SpO2 92%    BMI 43.76 kg/m  , BMI Body mass index is 43.76 kg/m. GENERAL:  Well appearing HEENT:  Pupils equal round and reactive, fundi not visualized, oral mucosa unremarkable NECK:  No jugular venous distention, waveform within normal limits, carotid upstroke brisk and symmetric, no bruits, no thyromegaly LYMPHATICS:  No cervical, inguinal adenopathy LUNGS:  Clear to auscultation bilaterally BACK:  No CVA tenderness CHEST:  Unremarkable HEART:  PMI not displaced or sustained,S1 and S2 within normal limits, no S3, no S4, no clicks, no rubs, no murmurs ABD:  Flat, positive bowel sounds normal in frequency in pitch, no bruits, no rebound, no guarding, no midline pulsatile mass, no hepatomegaly, no splenomegaly EXT:  2 plus pulses throughout, mild/mod left greater than right leg edema, no cyanosis no clubbing SKIN:  No rashes no nodules NEURO:  Cranial nerves II through XII grossly intact, motor grossly intact throughout PSYCH:  Cognitively intact, oriented to person place and time    EKG:  EKG is not ordered today. The ekg ordered 02/02/15 demonstrates sinus rhythm, rate 75, premature ventricular contractions, probable old lateral infarct age undetermined, no acute ST-T wave changes.   Recent Labs: 01/15/2021: ALT 15; Hemoglobin 14.4; Platelets 117 03/29/2021: BNP 167.5 04/05/2021: BUN 38; Creatinine, Ser 1.84; Potassium 5.0; Sodium 142    Lipid Panel    Component Value Date/Time   CHOL 125 11/21/2019 1148   TRIG 152 (H) 11/21/2019 1148   HDL 28 (L) 11/21/2019 1148   CHOLHDL 4.5 11/21/2019 1148   LDLCALC 70 11/21/2019 1148      Wt Readings from Last 3 Encounters:   04/10/21 287 lb 12.8 oz (130.5 kg)  04/05/21 287 lb (130.2 kg)  03/29/21 294 lb (133.4 kg)      Other studies Reviewed: Additional studies/ records that were reviewed today include: Hospital records. Review of the above records demonstrates:  Please see elsewhere in the note.     ASSESSMENT AND PLAN:  LEG SWELLING: I suspect this is multifactorial related to decreased ambulation and morbid obesity.  The BNP is minimally elevated  but I would not strongly suspect left-sided heart failure as an etiology.  I would like to assess his pulmonary pressures, right ventricular function and left ventricular function and valvular abnormalities to exclude any structural heart disease.  If this is unremarkable I would not suggest further cardiac work-up.  If he does have evidence of diastolic dysfunction he would probably be a good patient for [place]  ABNORMAL EKG:   His EKG is unchanged from previous.   He has no new cardiovascular symptoms.  No change in therapy.   MORBID OBESITY: Patient understands the need to lose weight with diet and exercise.   Current medicines are reviewed at length with the patient today.  The patient does not have concerns regarding medicines.  The following changes have been made:  None  Labs/ tests ordered today include: None  No orders of the defined types were placed in this encounter.    Disposition:   FU with me based on the results of the above.    Signed, Minus Breeding, MD  04/10/2021 11:57 AM    [facility]

## 2021-04-10 ENCOUNTER — Other Ambulatory Visit: Payer: Self-pay

## 2021-04-10 ENCOUNTER — Ambulatory Visit (INDEPENDENT_AMBULATORY_CARE_PROVIDER_SITE_OTHER): Payer: Medicare Other | Admitting: Cardiology

## 2021-04-10 ENCOUNTER — Encounter: Payer: Self-pay | Admitting: Cardiology

## 2021-04-10 ENCOUNTER — Other Ambulatory Visit: Payer: Medicare Other

## 2021-04-10 VITALS — BP 138/90 | HR 98 | Ht 68.0 in | Wt 287.8 lb

## 2021-04-10 DIAGNOSIS — E785 Hyperlipidemia, unspecified: Secondary | ICD-10-CM | POA: Diagnosis not present

## 2021-04-10 DIAGNOSIS — R9431 Abnormal electrocardiogram [ECG] [EKG]: Secondary | ICD-10-CM

## 2021-04-10 DIAGNOSIS — N183 Chronic kidney disease, stage 3 unspecified: Secondary | ICD-10-CM | POA: Diagnosis not present

## 2021-04-10 DIAGNOSIS — M7989 Other specified soft tissue disorders: Secondary | ICD-10-CM

## 2021-04-10 NOTE — Patient Instructions (Signed)
Medication Instructions:  The current medical regimen is effective;  continue present plan and medications.  *If you need a refill on your cardiac medications before your next appointment, please call your pharmacy*  Please have echo as ordered by Darla Lesches.  If OK - no follow up needed with Dr Percival Spanish.  Follow-Up: At [facility], you and your health needs are our priority.  As part of our continuing mission to provide you with exceptional heart care, we have created designated Provider Care Teams.  These Care Teams include your primary Cardiologist (physician) and Advanced Practice Providers (APPs -  Physician Assistants and Nurse Practitioners) who all work together to provide you with the care you need, when you need it.  We recommend signing up for the patient portal called "MyChart".  Sign up information is provided on this After Visit Summary.  MyChart is used to connect with patients for Virtual Visits (Telemedicine).  Patients are able to view lab/test results, encounter notes, upcoming appointments, etc.  Non-urgent messages can be sent to your provider as well.   To learn more about what you can do with MyChart, go to [email or web address].    Your next appointment:   Follow up as needed with Dr Percival Spanish.  Thank you for choosing [facility]!!

## 2021-04-11 LAB — BMP8+EGFR
BUN/Creatinine Ratio: 15 (ref 10–24)
BUN: 25 mg/dL (ref 8–27)
CO2: 23 mmol/L (ref 20–29)
Calcium: 9.4 mg/dL (ref 8.6–10.2)
Chloride: 100 mmol/L (ref 96–106)
Creatinine, Ser: 1.72 mg/dL — ABNORMAL HIGH (ref 0.76–1.27)
Glucose: 102 mg/dL — ABNORMAL HIGH (ref 70–99)
Potassium: 4.5 mmol/L (ref 3.5–5.2)
Sodium: 142 mmol/L (ref 134–144)
eGFR: 40 mL/min/{1.73_m2} — ABNORMAL LOW (ref >59)

## 2021-04-19 ENCOUNTER — Other Ambulatory Visit: Payer: Self-pay | Admitting: Family

## 2021-04-19 ENCOUNTER — Other Ambulatory Visit: Payer: Self-pay | Admitting: Family Medicine

## 2021-04-19 DIAGNOSIS — F419 Anxiety disorder, unspecified: Secondary | ICD-10-CM

## 2021-04-19 DIAGNOSIS — N400 Enlarged prostate without lower urinary tract symptoms: Secondary | ICD-10-CM

## 2021-04-19 DIAGNOSIS — I10 Essential (primary) hypertension: Secondary | ICD-10-CM

## 2021-04-19 DIAGNOSIS — R2243 Localized swelling, mass and lump, lower limb, bilateral: Secondary | ICD-10-CM

## 2021-05-02 ENCOUNTER — Ambulatory Visit: Payer: Medicare Other | Admitting: Family

## 2021-05-02 DIAGNOSIS — M5416 Radiculopathy, lumbar region: Secondary | ICD-10-CM | POA: Diagnosis not present

## 2021-05-02 DIAGNOSIS — Z79891 Long term (current) use of opiate analgesic: Secondary | ICD-10-CM | POA: Diagnosis not present

## 2021-05-02 DIAGNOSIS — M5136 Other intervertebral disc degeneration, lumbar region: Secondary | ICD-10-CM | POA: Diagnosis not present

## 2021-05-13 ENCOUNTER — Other Ambulatory Visit: Payer: Self-pay | Admitting: Family

## 2021-05-13 DIAGNOSIS — I10 Essential (primary) hypertension: Secondary | ICD-10-CM

## 2021-05-21 ENCOUNTER — Ambulatory Visit: Payer: Medicare Other | Admitting: Family

## 2021-05-30 ENCOUNTER — Other Ambulatory Visit: Payer: Self-pay | Admitting: Family

## 2021-06-03 ENCOUNTER — Ambulatory Visit (HOSPITAL_COMMUNITY)
Admission: RE | Admit: 2021-06-03 | Discharge: 2021-06-03 | Disposition: A | Discharge status: Home / Self Care | Payer: Medicare Other | Source: Ambulatory Visit | Attending: Family Medicine | Admitting: Family Medicine

## 2021-06-03 DIAGNOSIS — R2243 Localized swelling, mass and lump, lower limb, bilateral: Secondary | ICD-10-CM | POA: Diagnosis not present

## 2021-06-03 DIAGNOSIS — R0601 Orthopnea: Secondary | ICD-10-CM | POA: Diagnosis not present

## 2021-06-03 LAB — ECHOCARDIOGRAM COMPLETE
AR max vel: 2.82 cm2
AV Area VTI: 2.66 cm2
AV Area mean vel: 2.88 cm2
AV Mean grad: 5 mmHg
AV Peak grad: 10 mmHg
Ao pk vel: 1.58 m/s
Area-P 1/2: 3.13 cm2
Calc EF: 70.6 %
MV VTI: 2.72 cm2
S' Lateral: 3.2 cm
Single Plane A2C EF: 66 %
Single Plane A4C EF: 73.4 %

## 2021-06-03 NOTE — Progress Notes (Signed)
*  PRELIMINARY RESULTS* ?Echocardiogram ?2D Echocardiogram has been performed. ? ?Marcus Carr ?06/03/2021, 3:53 PM ?

## 2021-06-05 ENCOUNTER — Other Ambulatory Visit: Payer: Self-pay | Admitting: Family Medicine

## 2021-06-07 ENCOUNTER — Ambulatory Visit: Payer: Medicare Other | Admitting: Family

## 2021-06-07 ENCOUNTER — Encounter: Payer: Self-pay | Admitting: Family

## 2021-06-14 ENCOUNTER — Other Ambulatory Visit: Payer: Self-pay | Admitting: *Deleted

## 2021-06-14 MED ORDER — ALBUTEROL SULFATE HFA 108 (90 BASE) MCG/ACT IN AERS: INHALATION_SPRAY | RESPIRATORY_TRACT | 0 refills | Status: DC

## 2021-06-14 NOTE — Telephone Encounter (Signed)
[facility] NTBS in April for 6 mos FU. Mail order SENT ?

## 2021-06-18 NOTE — Telephone Encounter (Signed)
Apt scheduled 06/24/2021  ?

## 2021-06-20 ENCOUNTER — Other Ambulatory Visit: Payer: Self-pay | Admitting: Family

## 2021-06-20 DIAGNOSIS — J209 Acute bronchitis, unspecified: Secondary | ICD-10-CM

## 2021-06-24 ENCOUNTER — Encounter: Payer: Self-pay | Admitting: Family

## 2021-06-24 ENCOUNTER — Ambulatory Visit (INDEPENDENT_AMBULATORY_CARE_PROVIDER_SITE_OTHER): Payer: Medicare Other | Admitting: Family

## 2021-06-24 VITALS — BP 127/82 | HR 79 | Temp 97.8°F | Ht 68.0 in | Wt 280.2 lb

## 2021-06-24 DIAGNOSIS — F419 Anxiety disorder, unspecified: Secondary | ICD-10-CM

## 2021-06-24 DIAGNOSIS — E785 Hyperlipidemia, unspecified: Secondary | ICD-10-CM | POA: Diagnosis not present

## 2021-06-24 DIAGNOSIS — N1832 Chronic kidney disease, stage 3b: Secondary | ICD-10-CM | POA: Diagnosis not present

## 2021-06-24 DIAGNOSIS — Z0000 Encounter for general adult medical examination without abnormal findings: Secondary | ICD-10-CM

## 2021-06-24 DIAGNOSIS — Z0001 Encounter for general adult medical examination with abnormal findings: Secondary | ICD-10-CM

## 2021-06-24 DIAGNOSIS — I10 Essential (primary) hypertension: Secondary | ICD-10-CM

## 2021-06-24 NOTE — Patient Instructions (Signed)

## 2021-06-24 NOTE — Progress Notes (Signed)
? ?Subjective:  ? ? Patient ID: Marcus Carr, male    DOB: [birth year], 81 y.o.   MRN: [id number] ? ?Chief Complaint  ?Patient presents with  ? Medical Management of Chronic Issues  ? ?Pt presents to the office today for CPE and chronic follow up. PT has history of osteomyelitis of mandible, doing stable at this time. Pt is followed by Pain Management every 3 months for chronic back pain.  ?  ?He has CKD and tries to limit NSAID's. He is morbid obese with a BMI of 42. ?Hypertension ?This is a chronic problem. The current episode started more than 1 year ago. The problem has been resolved since onset. The problem is controlled. Associated symptoms include anxiety and malaise/fatigue. Pertinent negatives include no peripheral edema or shortness of breath. Risk factors for coronary artery disease include dyslipidemia, obesity, male gender and sedentary lifestyle. The current treatment provides moderate improvement. Hypertensive end-organ damage includes kidney disease.  ?Arthritis ?Presents for follow-up visit. He complains of pain and stiffness. The symptoms have been stable. Affected location: back. His pain is at a severity of 4/10.  ?Hyperlipidemia ?This is a chronic problem. The current episode started more than 1 year ago. The problem is controlled. Recent lipid tests were reviewed and are normal. Exacerbating diseases include obesity. Pertinent negatives include no shortness of breath. Current antihyperlipidemic treatment includes statins. The current treatment provides moderate improvement of lipids. Risk factors for coronary artery disease include dyslipidemia, male sex, hypertension and a sedentary lifestyle.  ?Back Pain ?This is a chronic problem. The current episode started more than 1 year ago. The problem has been waxing and waning since onset. The pain is present in the lumbar spine. The quality of the pain is described as aching. The pain is at a severity of 4/10.  ?Anxiety ?Presents for follow-up  visit. Patient reports no depressed mood, excessive worry, irritability, nervous/anxious behavior or shortness of breath. Symptoms occur occasionally. The severity of symptoms is moderate.  ? ? ? ? ? ?Review of Systems  ?Constitutional:  Positive for malaise/fatigue. Negative for irritability.  ?Respiratory:  Negative for shortness of breath.   ?Musculoskeletal:  Positive for arthritis, back pain and stiffness.  ?Psychiatric/Behavioral:  The patient is not nervous/anxious.   ?All other systems reviewed and are negative. ? ?   ?Family History  ?Problem Relation Age of Onset  ? Cancer Mother   ?     esophageal  ? Heart attack Father 23  ? ?Social History  ? ?Socioeconomic History  ? Marital status: Married  ?  Spouse name: Basilia Jumbo  ? Number of children: 2  ? Years of education: 8  ? Highest education level: 8th grade  ?Occupational History  ? Occupation: Retired  ?Tobacco Use  ? Smoking status: Former  ?  Packs/day: 1.50  ?  Years: 50.00  ?  Pack years: 75.00  ?  Types: Cigarettes  ?  Quit date: 10/24/2014  ?  Years since quitting: 6.6  ? Smokeless tobacco: Never  ?Vaping Use  ? Vaping Use: Never used  ?Substance and Sexual Activity  ? Alcohol use: No  ?  Alcohol/week: 0.0 standard drinks  ? Drug use: No  ? Sexual activity: Not on file  ?Other Topics Concern  ? Not on file  ?Social History Narrative  ? Lives wife and grandson stays with them a lot.  Two daughters - both live within 10 miles  ? ?Social Determinants of Health  ? ?Financial Resource Strain: Low Risk   ?  Difficulty of Paying Living Expenses: Not hard at all  ?Food Insecurity: No Food Insecurity  ? Worried About Charity fundraiser in the Last Year: Never true  ? Ran Out of Food in the Last Year: Never true  ?Transportation Needs: No Transportation Needs  ? Lack of Transportation (Medical): No  ? Lack of Transportation (Non-Medical): No  ?Physical Activity: Sufficiently Active  ? Days of Exercise per Week: 5 days  ? Minutes of Exercise per Session: 30 min   ?Stress: No Stress Concern Present  ? Feeling of Stress : Not at all  ?Social Connections: Moderately Isolated  ? Frequency of Communication with Friends and Family: More than three times a week  ? Frequency of Social Gatherings with Friends and Family: More than three times a week  ? Attends Religious Services: Never  ? Active Member of Clubs or Organizations: No  ? Attends Archivist Meetings: Never  ? Marital Status: Married  ? ? ?Objective:  ? Physical Exam ?Vitals reviewed.  ?Constitutional:   ?   General: He is not in acute distress. ?   Appearance: He is well-developed. He is obese.  ?HENT:  ?   Head: Normocephalic.  ?   Right Ear: Tympanic membrane normal.  ?   Left Ear: Tympanic membrane normal.  ?Eyes:  ?   General:     ?   Right eye: No discharge.     ?   Left eye: No discharge.  ?   Pupils: Pupils are equal, round, and reactive to light.  ?Neck:  ?   Thyroid: No thyromegaly.  ?Cardiovascular:  ?   Rate and Rhythm: Normal rate and regular rhythm.  ?   Heart sounds: Normal heart sounds. No murmur heard. ?Pulmonary:  ?   Effort: Pulmonary effort is normal. No respiratory distress.  ?   Breath sounds: Normal breath sounds. No wheezing.  ?Abdominal:  ?   General: Bowel sounds are normal. There is no distension.  ?   Palpations: Abdomen is soft.  ?   Tenderness: There is no abdominal tenderness.  ?Musculoskeletal:     ?   General: No tenderness. Normal range of motion.  ?   Cervical back: Normal range of motion and neck supple.  ?   Right lower leg: Edema (trace) present.  ?   Left lower leg: Edema (trace) present.  ?Skin: ?   General: Skin is warm and dry.  ?   Findings: No erythema or rash.  ?Neurological:  ?   Mental Status: He is alert and oriented to person, place, and time.  ?   Cranial Nerves: No cranial nerve deficit.  ?   Deep Tendon Reflexes: Reflexes are normal and symmetric.  ?Psychiatric:     ?   Behavior: Behavior normal.     ?   Thought Content: Thought content normal.     ?    Judgment: Judgment normal.  ? ? ? ? ? ?  BP 127/82   Pulse 79   Temp 97.8 ?F (36.6 ?C) (Temporal)   Ht '5\' 8"'  (1.727 m)   Wt 280 lb 3.2 oz (127.1 kg)   SpO2 93%   BMI 42.60 kg/m?  ? ?Assessment & Plan:  ?Kimani Hovis comes in today with chief complaint of Medical Management of Chronic Issues ? ? ?Diagnosis and orders addressed: ? ?1. Primary hypertension ?- CMP14+EGFR ?- CBC with Differential/Platelet ? ?2. Stage 3b chronic kidney disease ([place]) ?- CMP14+EGFR ?- CBC with Differential/Platelet ? ?  3. Anxiety ?- CMP14+EGFR ?- CBC with Differential/Platelet ? ?4. Hyperlipidemia, unspecified hyperlipidemia type ?- CMP14+EGFR ?- CBC with Differential/Platelet ?- Lipid panel ? ?5. Morbid obesity ([place]) ?- CMP14+EGFR ?- CBC with Differential/Platelet ? ?6. Annual physical exam ?- CMP14+EGFR ?- CBC with Differential/Platelet ?- Lipid panel ?- TSH ? ? ?Labs pending ?Health Maintenance reviewed ?Diet and exercise encouraged ? ?Follow up plan: ?6 months  ? ? ?[name], FNP ? ? ?

## 2021-06-25 ENCOUNTER — Telehealth: Payer: Self-pay | Admitting: *Deleted

## 2021-06-25 ENCOUNTER — Other Ambulatory Visit: Payer: Self-pay

## 2021-06-25 ENCOUNTER — Other Ambulatory Visit: Payer: Self-pay | Admitting: Family

## 2021-06-25 DIAGNOSIS — R7989 Other specified abnormal findings of blood chemistry: Secondary | ICD-10-CM

## 2021-06-25 LAB — CBC WITH DIFFERENTIAL/PLATELET
Basophils Absolute: 0 10*3/uL (ref 0.0–0.2)
Basos: 0 %
EOS (ABSOLUTE): 0 10*3/uL (ref 0.0–0.4)
Eos: 0 %
Hematocrit: 43.2 % (ref 37.5–51.0)
Hemoglobin: 14.2 g/dL (ref 13.0–17.7)
Immature Grans (Abs): 0 10*3/uL (ref 0.0–0.1)
Immature Granulocytes: 0 %
Lymphocytes Absolute: 1.1 10*3/uL (ref 0.7–3.1)
Lymphs: 16 %
MCH: 29 pg (ref 26.6–33.0)
MCHC: 32.9 g/dL (ref 31.5–35.7)
MCV: 88 fL (ref 79–97)
Monocytes Absolute: 0.5 10*3/uL (ref 0.1–0.9)
Monocytes: 8 %
Neutrophils Absolute: 5.3 10*3/uL (ref 1.4–7.0)
Neutrophils: 76 %
Platelets: 162 10*3/uL (ref 150–450)
RBC: 4.9 x10E6/uL (ref 4.14–5.80)
RDW: 13.5 % (ref 11.6–15.4)
WBC: 7 10*3/uL (ref 3.4–10.8)

## 2021-06-25 LAB — CMP14+EGFR
ALT: 13 IU/L (ref 0–44)
AST: 17 IU/L (ref 0–40)
Albumin/Globulin Ratio: 1.6 (ref 1.2–2.2)
Albumin: 4.3 g/dL (ref 3.7–4.7)
Alkaline Phosphatase: 94 IU/L (ref 44–121)
BUN/Creatinine Ratio: 18 (ref 10–24)
BUN: 27 mg/dL (ref 8–27)
Bilirubin Total: 0.4 mg/dL (ref 0.0–1.2)
CO2: 25 mmol/L (ref 20–29)
Calcium: 9.2 mg/dL (ref 8.6–10.2)
Chloride: 98 mmol/L (ref 96–106)
Creatinine, Ser: 1.54 mg/dL — ABNORMAL HIGH (ref 0.76–1.27)
Globulin, Total: 2.7 g/dL (ref 1.5–4.5)
Glucose: 103 mg/dL — ABNORMAL HIGH (ref 70–99)
Potassium: 4.4 mmol/L (ref 3.5–5.2)
Sodium: 140 mmol/L (ref 134–144)
Total Protein: 7 g/dL (ref 6.0–8.5)
eGFR: 46 mL/min/{1.73_m2} — ABNORMAL LOW (ref >59)

## 2021-06-25 LAB — LIPID PANEL
Chol/HDL Ratio: 3.2 ratio (ref 0.0–5.0)
Cholesterol, Total: 126 mg/dL (ref 100–199)
HDL: 39 mg/dL — ABNORMAL LOW (ref >39)
LDL Chol Calc (NIH): 65 mg/dL (ref 0–99)
Triglycerides: 124 mg/dL (ref 0–149)
VLDL Cholesterol Cal: 22 mg/dL (ref 5–40)

## 2021-06-25 LAB — TSH: TSH: 0.286 u[IU]/mL — ABNORMAL LOW (ref 0.450–4.500)

## 2021-06-25 MED ORDER — PROAIR RESPICLICK 108 (90 BASE) MCG/ACT IN AEPB: 1.0000 | INHALATION_SPRAY | RESPIRATORY_TRACT | 1 refills | Status: DC

## 2021-06-25 NOTE — Telephone Encounter (Signed)
Proair Prescription sent to pharmacy.  ?

## 2021-06-25 NOTE — Telephone Encounter (Signed)
Ventolin HFA AER 37mg Base is not covered by pt's insurance. Please send in alternative. ?

## 2021-07-06 ENCOUNTER — Other Ambulatory Visit: Payer: Self-pay | Admitting: Family

## 2021-07-06 DIAGNOSIS — I10 Essential (primary) hypertension: Secondary | ICD-10-CM

## 2021-07-09 ENCOUNTER — Ambulatory Visit: Payer: Medicare Other | Admitting: Family Medicine

## 2021-07-11 ENCOUNTER — Encounter: Payer: Self-pay | Admitting: Family Medicine

## 2021-07-11 ENCOUNTER — Ambulatory Visit (INDEPENDENT_AMBULATORY_CARE_PROVIDER_SITE_OTHER): Payer: Medicare Other | Admitting: Family Medicine

## 2021-07-11 VITALS — BP 106/68 | HR 67 | Temp 98.2°F | Ht 68.0 in | Wt 274.0 lb

## 2021-07-11 DIAGNOSIS — I959 Hypotension, unspecified: Secondary | ICD-10-CM

## 2021-07-11 DIAGNOSIS — R7989 Other specified abnormal findings of blood chemistry: Secondary | ICD-10-CM

## 2021-07-11 DIAGNOSIS — N1832 Chronic kidney disease, stage 3b: Secondary | ICD-10-CM

## 2021-07-11 DIAGNOSIS — E041 Nontoxic single thyroid nodule: Secondary | ICD-10-CM | POA: Diagnosis not present

## 2021-07-11 NOTE — Progress Notes (Signed)
?  ? ?Subjective:  ?Patient ID: Marcus Carr, male    DOB: [birth year], 81 y.o.   MRN: [id number] ? ?Patient Care Team: ?Sharion Balloon, FNP as PCP - General (Nurse Practitioner) ?Arnetha Courser (Inactive) as Surgeon ?Verlin Grills, MD as Attending Physician (Infectious Diseases)  ? ?Chief Complaint:  Hypotension ? ? ?HPI: ?Marcus Carr is a 81 y.o. male presenting on 07/11/2021 for Hypotension ? ? ?Patient presents today for evaluation of hypotension.  He states 2 weeks ago he stood up, became dizzy, and fell.  States blood pressure was checked and was noted to be pretty low.  He stopped his terazosin and has noticed a significant increase in blood pressure since that time.  He did hit his head during the fall and has bruising to his right eye.  Denies any focal neurological deficits and does not wish to have imaging or go to the hospital.  He states since he stopped the terazosin, he feels fine.  Last TSH was noted to be low.  He denies any hyperthyroid symptoms.  Renal function is declined but stable.  He denies any changes in urinary output or problems with BPH since stopping the terazosin.  He has continued to fluid pill with great reduction in lower extremity edema.  Has followed up with cardiology with no new changes to medications. ? ? ?Relevant past medical, surgical, family, and social history reviewed and updated as indicated.  ?Allergies and medications reviewed and updated. Data reviewed: Chart in Epic. ? ? ?Past Medical History:  ?Diagnosis Date  ? Abscess, jaw   ? Anxiety   ? Back pain   ? chronic back pain treated by Dr. Nelva Bush  ? Chronic kidney disease   ? Chronic mastoiditis of both sides 11/07/2014  ? Cigarette smoker 11/07/2014  ? COPD (chronic obstructive pulmonary disease) ([place])   ? Gout   ? Hyperlipidemia   ? Hypertension   ? Morbid obesity ([place]) 11/07/2014  ? Osteomyelitis of mandible 11/07/2014  ? Thyroid nodule 11/07/2014  ? ? ?Past Surgical History:  ?Procedure Laterality  Date  ? APPENDECTOMY    ? TONSILLECTOMY    ? ? ?Social History  ? ?Socioeconomic History  ? Marital status: Married  ?  Spouse name: Basilia Jumbo  ? Number of children: 2  ? Years of education: 8  ? Highest education level: 8th grade  ?Occupational History  ? Occupation: Retired  ?Tobacco Use  ? Smoking status: Former  ?  Packs/day: 1.50  ?  Years: 50.00  ?  Pack years: 75.00  ?  Types: Cigarettes  ?  Quit date: 10/24/2014  ?  Years since quitting: 6.7  ? Smokeless tobacco: Never  ?Vaping Use  ? Vaping Use: Never used  ?Substance and Sexual Activity  ? Alcohol use: No  ?  Alcohol/week: 0.0 standard drinks  ? Drug use: No  ? Sexual activity: Not on file  ?Other Topics Concern  ? Not on file  ?Social History Narrative  ? Lives wife and grandson stays with them a lot.  Two daughters - both live within 10 miles  ? ?Social Determinants of Health  ? ?Financial Resource Strain: Low Risk   ? Difficulty of Paying Living Expenses: Not hard at all  ?Food Insecurity: No Food Insecurity  ? Worried About Charity fundraiser in the Last Year: Never true  ? Ran Out of Food in the Last Year: Never true  ?Transportation Needs: No Transportation Needs  ? Lack of Transportation (  Medical): No  ? Lack of Transportation (Non-Medical): No  ?Physical Activity: Sufficiently Active  ? Days of Exercise per Week: 5 days  ? Minutes of Exercise per Session: 30 min  ?Stress: No Stress Concern Present  ? Feeling of Stress : Not at all  ?Social Connections: Moderately Isolated  ? Frequency of Communication with Friends and Family: More than three times a week  ? Frequency of Social Gatherings with Friends and Family: More than three times a week  ? Attends Religious Services: Never  ? Active Member of Clubs or Organizations: No  ? Attends Archivist Meetings: Never  ? Marital Status: Married  ?Intimate Partner Violence: Not At Risk  ? Fear of Current or Ex-Partner: No  ? Emotionally Abused: No  ? Physically Abused: No  ? Sexually Abused: No   ? ? ?Outpatient Encounter Medications as of 07/11/2021  ?Medication Sig  ? Albuterol Sulfate (PROAIR RESPICLICK) 546 (90 Base) MCG/ACT AEPB Inhale 1-2 puffs into the lungs every 6 (six) weeks. prn  ? aspirin 325 MG tablet Take 325 mg by mouth daily.  ? atorvastatin (LIPITOR) 20 MG tablet TAKE 1 TABLET BY MOUTH EVERY DAY  ? cetirizine (ZYRTEC) 10 MG tablet Take 1 tablet (10 mg total) by mouth daily.  ? cholecalciferol (VITAMIN D) 400 UNITS TABS tablet Take 800 Units by mouth daily.  ? citalopram (CELEXA) 20 MG tablet TAKE 1 TABLET BY MOUTH EVERY DAY  ? diclofenac sodium (VOLTAREN) 1 % GEL Apply 4 g topically 4 (four) times daily. To left knee for pain  ? fish oil-omega-3 fatty acids 1000 MG capsule Take 1 g by mouth 2 (two) times daily.   ? fluticasone (FLONASE) 50 MCG/ACT nasal spray SPRAY 2 SPRAYS INTO EACH NOSTRIL EVERY DAY  ? furosemide (LASIX) 40 MG tablet TAKE 1 TABLET BY MOUTH EVERY DAY  ? lisinopril (ZESTRIL) 20 MG tablet Take 1 tablet (20 mg total) by mouth daily.  ? oxyCODONE-acetaminophen (PERCOCET) 10-325 MG per tablet Take 1 tablet by mouth every 12 (twelve) hours as needed.  ? Specialty Vitamins Products (MAGNESIUM, AMINO ACID CHELATE,) 133 MG tablet Take 1 tablet by mouth daily.  ? verapamil (CALAN-SR) 180 MG CR tablet TAKE 1 TABLET BY MOUTH DAILY  ? [DISCONTINUED] terazosin (HYTRIN) 2 MG capsule TAKE 1 CAPSULE BY MOUTH EVERYDAY AT BEDTIME (Patient not taking: Reported on 07/11/2021)  ? ?No facility-administered encounter medications on file as of 07/11/2021.  ? ? ?Allergies  ?Allergen Reactions  ? Penicillins Rash  ? ? ?Review of Systems  ?Constitutional:  Negative for activity change, appetite change, chills, diaphoresis, fatigue, fever and unexpected weight change.  ?Respiratory:  Negative for cough, chest tightness and shortness of breath.   ?Cardiovascular:  Negative for chest pain, palpitations and leg swelling.  ?Gastrointestinal:  Negative for abdominal distention, abdominal pain and blood in  stool.  ?Genitourinary:  Negative for decreased urine volume, difficulty urinating, penile swelling, scrotal swelling and testicular pain.  ?Neurological:  Positive for dizziness. Negative for tremors, seizures, syncope, facial asymmetry, speech difficulty, weakness, light-headedness, numbness and headaches.  ?Psychiatric/Behavioral:  Negative for confusion.   ?All other systems reviewed and are negative. ? ?   ? ?Objective:  ?BP 106/68   Pulse 67   Temp 98.2 ?F (36.8 ?C)   Ht '5\' 8"'$  (1.727 m)   Wt 274 lb (124.3 kg)   SpO2 94%   BMI 41.66 kg/m?   ? ?Wt Readings from Last 3 Encounters:  ?07/11/21 274 lb (124.3 kg)  ?  06/24/21 280 lb 3.2 oz (127.1 kg)  ?04/10/21 287 lb 12.8 oz (130.5 kg)  ? ? ?Physical Exam ?Vitals and nursing note reviewed.  ?Constitutional:   ?   General: He is not in acute distress. ?   Appearance: Normal appearance. He is well-developed and well-groomed. He is obese. He is not ill-appearing, toxic-appearing or diaphoretic.  ?HENT:  ?   Head: Normocephalic and atraumatic.  ?   Jaw: There is normal jaw occlusion.  ?   Right Ear: Hearing normal.  ?   Left Ear: Hearing normal.  ?   Nose: Nose normal.  ?   Mouth/Throat:  ?   Lips: Pink.  ?   Mouth: Mucous membranes are moist.  ?   Pharynx: Oropharynx is clear. Uvula midline.  ?Eyes:  ?   General: Lids are normal.  ?   Extraocular Movements: Extraocular movements intact.  ?   Conjunctiva/sclera: Conjunctivae normal.  ?   Pupils: Pupils are equal, round, and reactive to light.  ?Neck:  ?   Thyroid: No thyroid mass, thyromegaly or thyroid tenderness.  ?   Vascular: No carotid bruit or JVD.  ?   Trachea: Trachea and phonation normal.  ?Cardiovascular:  ?   Rate and Rhythm: Normal rate and regular rhythm.  ?   Chest Wall: PMI is not displaced.  ?   Pulses: Normal pulses.  ?   Heart sounds: Normal heart sounds. No murmur heard. ?  No friction rub. No gallop.  ?Pulmonary:  ?   Effort: Pulmonary effort is normal. No respiratory distress.  ?   Breath  sounds: Normal breath sounds. No wheezing.  ?Abdominal:  ?   General: Bowel sounds are normal. There is no distension or abdominal bruit.  ?   Palpations: Abdomen is soft. There is no hepatomegaly or splenomegaly.  ?   Ten

## 2021-07-12 ENCOUNTER — Other Ambulatory Visit: Payer: Self-pay | Admitting: *Deleted

## 2021-07-12 DIAGNOSIS — R899 Unspecified abnormal finding in specimens from other organs, systems and tissues: Secondary | ICD-10-CM

## 2021-07-12 LAB — THYROID PANEL WITH TSH
Free Thyroxine Index: 1.6 (ref 1.2–4.9)
T3 Uptake Ratio: 30 % (ref 24–39)
T4, Total: 5.3 ug/dL (ref 4.5–12.0)
TSH: 1.87 u[IU]/mL (ref 0.450–4.500)

## 2021-07-12 LAB — BMP8+EGFR
BUN/Creatinine Ratio: 18 (ref 10–24)
BUN: 32 mg/dL — ABNORMAL HIGH (ref 8–27)
CO2: 25 mmol/L (ref 20–29)
Calcium: 9.5 mg/dL (ref 8.6–10.2)
Chloride: 101 mmol/L (ref 96–106)
Creatinine, Ser: 1.8 mg/dL — ABNORMAL HIGH (ref 0.76–1.27)
Glucose: 104 mg/dL — ABNORMAL HIGH (ref 70–99)
Potassium: 5 mmol/L (ref 3.5–5.2)
Sodium: 143 mmol/L (ref 134–144)
eGFR: 38 mL/min/{1.73_m2} — ABNORMAL LOW (ref >59)

## 2021-07-12 LAB — CBC WITH DIFFERENTIAL/PLATELET
Basophils Absolute: 0 10*3/uL (ref 0.0–0.2)
Basos: 0 %
EOS (ABSOLUTE): 0.1 10*3/uL (ref 0.0–0.4)
Eos: 2 %
Hematocrit: 42.8 % (ref 37.5–51.0)
Hemoglobin: 14.4 g/dL (ref 13.0–17.7)
Immature Grans (Abs): 0 10*3/uL (ref 0.0–0.1)
Immature Granulocytes: 0 %
Lymphocytes Absolute: 1.5 10*3/uL (ref 0.7–3.1)
Lymphs: 27 %
MCH: 29.3 pg (ref 26.6–33.0)
MCHC: 33.6 g/dL (ref 31.5–35.7)
MCV: 87 fL (ref 79–97)
Monocytes Absolute: 0.5 10*3/uL (ref 0.1–0.9)
Monocytes: 9 %
Neutrophils Absolute: 3.4 10*3/uL (ref 1.4–7.0)
Neutrophils: 62 %
Platelets: 145 10*3/uL — ABNORMAL LOW (ref 150–450)
RBC: 4.92 x10E6/uL (ref 4.14–5.80)
RDW: 13.5 % (ref 11.6–15.4)
WBC: 5.6 10*3/uL (ref 3.4–10.8)

## 2021-07-12 NOTE — Addendum Note (Signed)
Addended by: Geryl Rankins D on: 07/12/2021 01:47 PM ? ? Modules accepted: Orders ? ?

## 2021-07-22 ENCOUNTER — Other Ambulatory Visit: Payer: Medicare Other

## 2021-07-22 DIAGNOSIS — R899 Unspecified abnormal finding in specimens from other organs, systems and tissues: Secondary | ICD-10-CM | POA: Diagnosis not present

## 2021-07-23 LAB — BASIC METABOLIC PANEL
BUN/Creatinine Ratio: 16 (ref 10–24)
BUN: 24 mg/dL (ref 8–27)
CO2: 25 mmol/L (ref 20–29)
Calcium: 9.6 mg/dL (ref 8.6–10.2)
Chloride: 101 mmol/L (ref 96–106)
Creatinine, Ser: 1.49 mg/dL — ABNORMAL HIGH (ref 0.76–1.27)
Glucose: 112 mg/dL — ABNORMAL HIGH (ref 70–99)
Potassium: 4.3 mmol/L (ref 3.5–5.2)
Sodium: 141 mmol/L (ref 134–144)
eGFR: 47 mL/min/{1.73_m2} — ABNORMAL LOW (ref >59)

## 2021-07-24 NOTE — Progress Notes (Signed)
Contacted patient. Notified patient. Patient verbalized understanding  °

## 2021-07-29 DIAGNOSIS — M5416 Radiculopathy, lumbar region: Secondary | ICD-10-CM | POA: Diagnosis not present

## 2021-07-29 DIAGNOSIS — Z79891 Long term (current) use of opiate analgesic: Secondary | ICD-10-CM | POA: Diagnosis not present

## 2021-07-29 DIAGNOSIS — M5136 Other intervertebral disc degeneration, lumbar region: Secondary | ICD-10-CM | POA: Diagnosis not present

## 2021-08-02 ENCOUNTER — Other Ambulatory Visit: Payer: Self-pay | Admitting: Family

## 2021-08-02 DIAGNOSIS — R2243 Localized swelling, mass and lump, lower limb, bilateral: Secondary | ICD-10-CM

## 2021-08-02 DIAGNOSIS — I10 Essential (primary) hypertension: Secondary | ICD-10-CM

## 2021-08-02 DIAGNOSIS — E8881 Metabolic syndrome: Secondary | ICD-10-CM

## 2021-08-02 DIAGNOSIS — E785 Hyperlipidemia, unspecified: Secondary | ICD-10-CM

## 2021-08-12 ENCOUNTER — Ambulatory Visit (INDEPENDENT_AMBULATORY_CARE_PROVIDER_SITE_OTHER): Payer: Medicare Other | Admitting: Family

## 2021-08-12 ENCOUNTER — Encounter: Payer: Self-pay | Admitting: Family

## 2021-08-12 VITALS — BP 135/81 | HR 81 | Temp 98.0°F | Ht 68.0 in | Wt 278.0 lb

## 2021-08-12 DIAGNOSIS — I10 Essential (primary) hypertension: Secondary | ICD-10-CM

## 2021-08-12 DIAGNOSIS — Y92009 Unspecified place in unspecified non-institutional (private) residence as the place of occurrence of the external cause: Secondary | ICD-10-CM | POA: Diagnosis not present

## 2021-08-12 DIAGNOSIS — W19XXXD Unspecified fall, subsequent encounter: Secondary | ICD-10-CM

## 2021-08-12 DIAGNOSIS — N1832 Chronic kidney disease, stage 3b: Secondary | ICD-10-CM

## 2021-08-12 DIAGNOSIS — R55 Syncope and collapse: Secondary | ICD-10-CM | POA: Diagnosis not present

## 2021-08-12 NOTE — Patient Instructions (Signed)
Chronic Kidney Disease, Adult Chronic kidney disease (CKD) occurs when the kidneys are slowly and permanently damaged over a long period of time. The kidneys are a pair of organs that do many important jobs in the body, including: Removing waste and extra fluid from the blood to make urine. Making hormones that maintain the amount of fluid in tissues and blood vessels. Maintaining the right amount of fluids and chemicals in the body. A small amount of kidney damage may not cause problems, but a large amount of damage may make it hard or impossible for the kidneys to work right. Steps must be taken to slow kidney damage or to stop it from getting worse. If steps are not taken, the kidneys may stop working permanently (end-stage renal disease, or ESRD). Most of the time, CKD does not go away, but it can often be controlled. People who have CKD are usually able to live full lives. What are the causes? The most common causes of this condition are diabetes and high blood pressure (hypertension). Other causes include: Cardiovascular diseases. These affect the heart and blood vessels. Kidney diseases. These include: Glomerulonephritis, or inflammation of the tiny filters in the kidneys. Interstitial nephritis. This is swelling of the small tubes of the kidneys and of the surrounding structures. Polycystic kidney disease, in which clusters of fluid-filled sacs form within the kidneys. Renal vascular disease. This includes disorders that affect the arteries and veins of the kidneys. Diseases that affect the body's defense system (immune system). A problem with urine flow. This may be caused by: Kidney stones. Cancer. An enlarged prostate, in males. A kidney infection or urinary tract infection (UTI) that keeps coming back. Vasculitis. This is swelling or inflammation of the blood vessels. What increases the risk? Your chances of having kidney disease increase with age. The following factors may make  you more likely to develop this condition: A family history of kidney disease or kidney failure. Kidney failure means the kidneys can no longer work right. Certain genetic diseases. Taking medicines often that are damaging to the kidneys. Being around or being in contact with toxic substances. Obesity. A history of tobacco use. What are the signs or symptoms? Symptoms of this condition include: Feeling very tired (lethargic) and having less energy. Swelling, or edema, of the face, legs, ankles, or feet. Nausea or vomiting, or loss of appetite. Confusion or trouble concentrating. Muscle twitches and cramps, especially in the legs. Dry, itchy skin. A metallic taste in the mouth. Producing less urine, or producing more urine (especially at night). Shortness of breath. Trouble sleeping. CKD may also result in not having enough red blood cells or hemoglobin in the blood (anemia) or having weak bones (bone disease). Symptoms develop slowly and may not be obvious until the kidney damage becomes severe. It is possible to have kidney disease for years without having symptoms. How is this diagnosed? This condition may be diagnosed based on: Blood tests. Urine tests. Imaging tests, such as an ultrasound or a CT scan. A kidney biopsy. This involves removing a sample of kidney tissue to be looked at under a microscope. Results from these tests will help to determine how serious the CKD is. How is this treated? There is no cure for most cases of this condition, but treatment usually relieves symptoms and prevents or slows the worsening of the disease. Treatment may include: Diet changes, which may require you to avoid alcohol and foods that are high in salt, potassium, phosphorous, and protein. Medicines. These may:   Lower blood pressure. Control blood sugar (glucose). Relieve anemia. Relieve swelling. Protect your bones. Improve the balance of salts and minerals in your blood  (electrolytes). Dialysis, which is a type of treatment that removes toxic waste from the body. It may be needed if you have kidney failure. Managing any other conditions that are causing your CKD or making it worse. Follow these instructions at home: Medicines Take over-the-counter and prescription medicines only as told by your health care provider. The amount of some medicines that you take may need to be changed. Do not take any new medicines unless approved by your health care provider. Many medicines can make kidney damage worse. Do not take any vitamin and mineral supplements unless approved by your health care provider. Many nutritional supplements can make kidney damage worse. Lifestyle  Do not use any products that contain nicotine or tobacco, such as cigarettes, e-cigarettes, and chewing tobacco. If you need help quitting, ask your health care provider. If you drink alcohol: Limit how much you use to: 0-1 drink a day for women who are not pregnant. 0-2 drinks a day for men. Know how much alcohol is in your drink. In the [place] one drink equals one 12 oz bottle of beer (355 mL), one 5 oz glass of wine (148 mL), or one 1 oz glass of hard liquor (44 mL). Maintain a healthy weight. If you need help, ask your health care provider. General instructions  Follow instructions from your health care provider about eating or drinking restrictions, including any prescribed diet. Track your blood pressure at home. Report changes in your blood pressure as told. If you are being treated for diabetes, track your blood glucose levels as told. Start or continue an exercise plan. Exercise at least 30 minutes a day, 5 days a week. Keep your immunizations up to date as told. Keep all follow-up visits. This is important. Where to find more information American Association of Kidney Patients: www.aakp.org National Kidney Foundation: www.kidney.org American Kidney Fund: www.akfinc.org Life Options:  www.lifeoptions.org Kidney School: www.kidneyschool.org Contact a health care provider if: Your symptoms get worse. You develop new symptoms. Get help right away if: You develop symptoms of ESRD. These include: Headaches. Numbness in your hands or feet. Easy bruising. Frequent hiccups. Chest pain. Shortness of breath. Lack of menstrual periods, in women. You have a fever. You are producing less urine than usual. You have pain or bleeding when you urinate or when you have a bowel movement. These symptoms may represent a serious problem that is an emergency. Do not wait to see if the symptoms will go away. Get medical help right away. Call your local emergency services (911 in the [place] Do not drive yourself to the hospital. Summary Chronic kidney disease (CKD) occurs when the kidneys become damaged slowly over a long period of time. The most common causes of this condition are diabetes and high blood pressure (hypertension). There is no cure for most cases of CKD, but treatment usually relieves symptoms and prevents or slows the worsening of the disease. Treatment may include a combination of lifestyle changes, medicines, and dialysis. This information is not intended to replace advice given to you by your health care provider. Make sure you discuss any questions you have with your health care provider. Document Revised: 06/15/2019 Document Reviewed: 06/15/2019 Elsevier Patient Education  2023 [employer]  

## 2021-08-12 NOTE — Progress Notes (Signed)
Subjective:    Patient ID: Marcus Carr, male    DOB: [birth year], 81 y.o.   MRN: [id number]  Chief Complaint  Patient presents with   Medical Management of Chronic Issues    Hypertension f/u   PT presents to the office today to follow up on BP. He had several episodes of hypotension and had a syncope episode. He stopped his his terazosin. He was then seen in the clinic on 07/11/21. He has not fallen since stopping that medication.   He had normal TSH and CBC. His creatinine was elevated and rechecked and closer to baseline. He does have CKD.  Hypertension This is a chronic problem. The current episode started more than 1 year ago. The problem has been resolved since onset. The problem is controlled. Associated symptoms include malaise/fatigue. Pertinent negatives include no peripheral edema or shortness of breath. Risk factors for coronary artery disease include dyslipidemia, obesity, male gender and sedentary lifestyle. The current treatment provides moderate improvement.     Review of Systems  Constitutional:  Positive for malaise/fatigue.  Respiratory:  Negative for shortness of breath.   All other systems reviewed and are negative.     Objective:   Physical Exam Vitals reviewed.  Constitutional:      General: He is not in acute distress.    Appearance: He is well-developed. He is obese.  HENT:     Head: Normocephalic.     Right Ear: Tympanic membrane normal.     Left Ear: Tympanic membrane normal.  Eyes:     General:        Right eye: No discharge.        Left eye: No discharge.     Pupils: Pupils are equal, round, and reactive to light.  Neck:     Thyroid: No thyromegaly.  Cardiovascular:     Rate and Rhythm: Normal rate and regular rhythm.     Heart sounds: Normal heart sounds. No murmur heard. Pulmonary:     Effort: Pulmonary effort is normal. No respiratory distress.     Breath sounds: Normal breath sounds. No wheezing.  Abdominal:     General: Bowel  sounds are normal. There is no distension.     Palpations: Abdomen is soft.     Tenderness: There is no abdominal tenderness.  Musculoskeletal:        General: No tenderness. Normal range of motion.     Cervical back: Normal range of motion and neck supple.     Right lower leg: Edema (trace) present.     Left lower leg: Edema (trace) present.  Skin:    General: Skin is warm and dry.     Findings: No erythema or rash.  Neurological:     Mental Status: He is alert and oriented to person, place, and time.     Cranial Nerves: No cranial nerve deficit.     Deep Tendon Reflexes: Reflexes are normal and symmetric.  Psychiatric:        Behavior: Behavior normal.        Thought Content: Thought content normal.        Judgment: Judgment normal.        BP 135/81   Pulse 81   Temp 98 F (36.7 C)   Ht '5\' 8"'  (1.727 m)   Wt 278 lb (126.1 kg)   BMI 42.27 kg/m   Assessment & Plan:  Marcus Carr comes in today with chief complaint of Medical Management of Chronic Issues (Hypertension  f/u)   Diagnosis and orders addressed:  1. Primary hypertension - BMP8+EGFR  2. Stage 3b chronic kidney disease (HCC) - BMP8+EGFR  3. Morbid obesity (HCC) - BMP8+EGFR  4. Fall in home, subsequent encounter - BMP8+EGFR  5. Syncope, unspecified syncope type - BMP8+EGFR   Labs pending Continue current medications  Health Maintenance reviewed Diet and exercise encouraged  Follow up plan: Keep chronic follow up  [name], FNP

## 2021-08-13 LAB — BMP8+EGFR
BUN/Creatinine Ratio: 15 (ref 10–24)
BUN: 26 mg/dL (ref 8–27)
CO2: 24 mmol/L (ref 20–29)
Calcium: 9 mg/dL (ref 8.6–10.2)
Chloride: 100 mmol/L (ref 96–106)
Creatinine, Ser: 1.69 mg/dL — ABNORMAL HIGH (ref 0.76–1.27)
Glucose: 104 mg/dL — ABNORMAL HIGH (ref 70–99)
Potassium: 4.4 mmol/L (ref 3.5–5.2)
Sodium: 140 mmol/L (ref 134–144)
eGFR: 41 mL/min/{1.73_m2} — ABNORMAL LOW (ref >59)

## 2021-08-15 ENCOUNTER — Other Ambulatory Visit: Payer: Self-pay | Admitting: Family

## 2021-09-02 ENCOUNTER — Other Ambulatory Visit: Payer: Self-pay | Admitting: Family

## 2021-09-11 ENCOUNTER — Other Ambulatory Visit: Payer: Self-pay | Admitting: Family

## 2021-09-11 DIAGNOSIS — F419 Anxiety disorder, unspecified: Secondary | ICD-10-CM

## 2021-09-25 DIAGNOSIS — M1990 Unspecified osteoarthritis, unspecified site: Secondary | ICD-10-CM | POA: Insufficient documentation

## 2021-09-25 DIAGNOSIS — M25562 Pain in left knee: Secondary | ICD-10-CM | POA: Diagnosis not present

## 2021-09-25 DIAGNOSIS — M1712 Unilateral primary osteoarthritis, left knee: Secondary | ICD-10-CM | POA: Insufficient documentation

## 2021-09-25 DIAGNOSIS — M25561 Pain in right knee: Secondary | ICD-10-CM | POA: Diagnosis not present

## 2021-10-22 ENCOUNTER — Other Ambulatory Visit: Payer: Self-pay | Admitting: Family

## 2021-10-22 DIAGNOSIS — J209 Acute bronchitis, unspecified: Secondary | ICD-10-CM

## 2021-10-22 MED ORDER — FLUTICASONE PROPIONATE 50 MCG/ACT NA SUSP: NASAL | 1 refills | Status: DC

## 2021-10-24 ENCOUNTER — Telehealth: Payer: Self-pay

## 2021-10-24 NOTE — Telephone Encounter (Signed)
If patient passed out he needs to be seen in person for labs and possible EKG.

## 2021-10-24 NOTE — Telephone Encounter (Signed)
Patient aware - appt made 

## 2021-10-25 ENCOUNTER — Ambulatory Visit (INDEPENDENT_AMBULATORY_CARE_PROVIDER_SITE_OTHER): Payer: Medicare Other | Admitting: Family

## 2021-10-25 ENCOUNTER — Encounter: Payer: Self-pay | Admitting: Family

## 2021-10-25 VITALS — BP 109/72 | HR 73 | Temp 97.6°F | Ht 68.0 in | Wt 281.4 lb

## 2021-10-25 DIAGNOSIS — R6889 Other general symptoms and signs: Secondary | ICD-10-CM | POA: Diagnosis not present

## 2021-10-25 DIAGNOSIS — I951 Orthostatic hypotension: Secondary | ICD-10-CM | POA: Diagnosis not present

## 2021-10-25 DIAGNOSIS — R55 Syncope and collapse: Secondary | ICD-10-CM | POA: Diagnosis not present

## 2021-10-25 MED ORDER — LISINOPRIL 10 MG PO TABS: 10.0000 mg | ORAL_TABLET | Freq: Every day | ORAL | 3 refills | Status: DC

## 2021-10-25 NOTE — Progress Notes (Signed)
Subjective:    Patient ID: Marcus Carr, male    DOB: 11-21-1940, 81 y.o.   MRN: [id number]  Chief Complaint  Patient presents with   Loss of Consciousness   PT presents to the office today after a syncope episode that happened after he stood up yesterday. States he gets light headed when he stand up.   Reports he checks his BP several times a day. States it runs 93/49-127/58 at home.  Loss of Consciousness The current episode started yesterday. He lost consciousness for a period of less than 1 minute. The symptoms are aggravated by standing. Associated symptoms include malaise/fatigue. Pertinent negatives include no bowel incontinence, dizziness, focal sensory loss or visual change. He has tried sleep for the symptoms. The treatment provided mild relief.      Review of Systems  Constitutional:  Positive for malaise/fatigue.  Cardiovascular:  Positive for syncope.  Gastrointestinal:  Negative for bowel incontinence.  Neurological:  Negative for dizziness.  All other systems reviewed and are negative.      Objective:   Physical Exam Vitals reviewed.  Constitutional:      General: He is not in acute distress.    Appearance: He is well-developed. He is obese.  HENT:     Head: Normocephalic.     Right Ear: Tympanic membrane normal.     Left Ear: Tympanic membrane normal.  Eyes:     General:        Right eye: No discharge.        Left eye: No discharge.     Pupils: Pupils are equal, round, and reactive to light.  Neck:     Thyroid: No thyromegaly.  Cardiovascular:     Rate and Rhythm: Normal rate and regular rhythm.     Heart sounds: Normal heart sounds. No murmur heard. Pulmonary:     Effort: Pulmonary effort is normal. No respiratory distress.     Breath sounds: Normal breath sounds. No wheezing.  Abdominal:     General: Bowel sounds are normal. There is no distension.     Palpations: Abdomen is soft.     Tenderness: There is no abdominal tenderness.   Musculoskeletal:        General: No tenderness. Normal range of motion.     Cervical back: Normal range of motion and neck supple.  Skin:    General: Skin is warm and dry.     Findings: No erythema or rash.  Neurological:     Mental Status: He is alert and oriented to person, place, and time.     Cranial Nerves: No cranial nerve deficit.     Deep Tendon Reflexes: Reflexes are normal and symmetric.  Psychiatric:        Behavior: Behavior normal.        Thought Content: Thought content normal.        Judgment: Judgment normal.      BP 109/72 (Patient Position: Standing)   Pulse 73   Temp 97.6 F (36.4 C) (Temporal)   Ht '5\' 8"'  (1.727 m)   Wt 281 lb 6.4 oz (127.6 kg)   SpO2 93%   BMI 42.79 kg/m       Assessment & Plan:  Marcus Carr comes in today with chief complaint of Loss of Consciousness   Diagnosis and orders addressed:  1. Syncope, unspecified syncope type - EKG 12-Lead - CMP14+EGFR - Anemia Profile B - TSH  2. Orthostatic hypotension - Anemia Profile B   Will decrease  lisinopril to 10 mg from 20 mg  Labs pending  When standing get up slowly and count to 3-5 seconds Force fluids Follow up if symptoms continue   [name], FNP

## 2021-10-25 NOTE — Patient Instructions (Addendum)
Syncope, Adult  Syncope refers to a condition in which a person temporarily loses consciousness. Syncope may also be called fainting or passing out. It is caused by a sudden decrease in blood flow to the brain. This can happen for a variety of reasons. Most causes of syncope are not dangerous. It can be triggered by things such as needle sticks, seeing blood, pain, or intense emotion. However, syncope can also be a sign of a serious medical problem, such as a heart abnormality. Other causes can include dehydration, migraines, or taking medicines that lower blood pressure. Your health care provider may do tests to find the reason why you are having syncope. If you faint, get medical help right away. Call your local emergency services (911 in the [place] Follow these instructions at home: Pay attention to any changes in your symptoms. Take these actions to stay safe and to help relieve your symptoms: Knowing when you may be about to faint Signs that you may be about to faint include: Feeling dizzy, weak, light-headed, or like the room is spinning. Feeling nauseous. Seeing spots or seeing all white or all black in your field of vision. Having cold, clammy skin or feeling warm and sweaty. Hearing ringing in the ears (tinnitus). If you start to feel like you might faint, sit or lie down right away. If sitting, put your head down between your legs. If lying down, raise (elevate) your feet above the level of your heart. Breathe deeply and steadily. Wait until all the symptoms have passed. Have someone stay with you until you feel stable. Medicines Take over-the-counter and prescription medicines only as told by your health care provider. If you are taking blood pressure or heart medicine, get up slowly and take several minutes to sit and then stand. This can reduce dizziness and decrease the risk of syncope. Lifestyle Do not drive, use machinery, or play sports until your health care provider says it is  okay. Do not drink alcohol. Do not use any products that contain nicotine or tobacco. These products include cigarettes, chewing tobacco, and vaping devices, such as e-cigarettes. If you need help quitting, ask your health care provider. Avoid hot tubs and saunas. General instructions Talk with your health care provider about your symptoms. You may need to have testing to understand the cause of your syncope. Drink enough fluid to keep your urine pale yellow. Avoid prolonged standing. If you must stand for a long time, do movements such as: Moving your legs. Crossing your legs. Flexing and stretching your leg muscles. Squatting. Keep all follow-up visits. This is important. Contact a health care provider if: You have episodes of near fainting. Get help right away if: You faint. You hit your head or are injured after fainting. You have any of these symptoms that may indicate trouble with your heart: Fast or irregular heartbeats (palpitations). Unusual pain in your chest, abdomen, or back. Shortness of breath. You have a seizure. You have a severe headache. You are confused. You have vision problems. You have severe weakness or trouble walking. You are bleeding from your mouth or rectum, or you have black or tarry stool. These symptoms may represent a serious problem that is an emergency. Do not wait to see if your symptoms will go away. Get medical help right away. Call your local emergency services (911 in the [place] Do not drive yourself to the hospital. Summary Syncope refers to a condition in which a person temporarily loses consciousness. Syncope may also be called   fainting or passing out. It is caused by a sudden decrease in blood flow to the brain. Signs that you may be about to faint include dizziness, feeling light-headed, feeling nauseous, sudden vision changes, or cold, clammy skin. Even though most causes of syncope are not dangerous, syncope can be a sign of a serious  medical problem. Get help right away if you faint. If you start to feel like you might faint, sit or lie down right away. If sitting, put your head down between your legs. If lying down, raise (elevate) your feet above the level of your heart. This information is not intended to replace advice given to you by your health care provider. Make sure you discuss any questions you have with your health care provider.  Orthostatic Hypotension Blood pressure is a measurement of how strongly, or weakly, your circulating blood is pressing against the walls of your arteries. Orthostatic hypotension is a drop in blood pressure that can happen when you change positions, such as when you go from lying down to standing. Arteries are blood vessels that carry blood from your heart throughout your body. When blood pressure is too low, you may not get enough blood to your brain or to the rest of your organs. Orthostatic hypotension can cause light-headedness, sweating, rapid heartbeat, blurred vision, and fainting. These symptoms require further investigation into the cause. What are the causes? Orthostatic hypotension can be caused by many things, including: Sudden changes in posture, such as standing up quickly after you have been sitting or lying down. Loss of blood (anemia) or loss of body fluids (dehydration). Heart problems, neurologic problems, or hormone problems. Pregnancy. Aging. The risk for this condition increases as you get older. Severe infection (sepsis). Certain medicines, such as medicines for high blood pressure or medicines that make the body lose excess fluids (diuretics). What are the signs or symptoms? Symptoms of this condition may include: Weakness, light-headedness, or dizziness. Sweating. Blurred vision. Tiredness (fatigue). Rapid heartbeat. Fainting, in severe cases. How is this diagnosed? This condition is diagnosed based on: Your symptoms and medical history. Your blood pressure  measurements. Your health care provider will check your blood pressure when you are: Lying down. Sitting. Standing. A blood pressure reading is recorded as two numbers, such as "120 over 80" (or 120/80). The first ("top") number is called the systolic pressure. It is a measure of the pressure in your arteries as your heart beats. The second ("bottom") number is called the diastolic pressure. It is a measure of the pressure in your arteries when your heart relaxes between beats. Blood pressure is measured in a unit called mmHg. Healthy blood pressure for most adults is 120/80 mmHg. Orthostatic hypotension is defined as a 20 mmHg drop in systolic pressure or a 10 mmHg drop in diastolic pressure within 3 minutes of standing. Other information or tests that may be used to diagnose orthostatic hypotension include: Your other vital signs, such as your heart rate and temperature. Blood tests. An electrocardiogram (ECG) or echocardiogram. A Holter monitor. This is a device you wear that records your heart rhythm continuously, usually for 24-48 hours. Tilt table test. For this test, you will be safely secured to a table that moves you from a lying position to an upright position. Your heart rhythm and blood pressure will be monitored during the test. How is this treated? This condition may be treated by: Changing your diet. This may involve eating more salt (sodium) or drinking more water. Changing the dosage  of certain medicines you are taking that might be lowering your blood pressure. Correcting the underlying reason for the orthostatic hypotension. Wearing compression stockings. Taking medicines to raise your blood pressure. Avoiding actions that trigger symptoms. Follow these instructions at home: Medicines Take over-the-counter and prescription medicines only as told by your health care provider. Follow instructions from your health care provider about changing the dosage of your current  medicines, if this applies. Do not stop or adjust any of your medicines on your own. Eating and drinking  Drink enough fluid to keep your urine pale yellow. Eat extra salt only as directed. Do not add extra salt to your diet unless advised by your health care provider. Eat frequent, small meals. Avoid standing up suddenly after eating. General instructions  Get up slowly from lying down or sitting positions. This gives your blood pressure a chance to adjust. Avoid hot showers and excessive heat as directed by your health care provider. Engage in regular physical activity as directed by your health care provider. If you have compression stockings, wear them as told. Keep all follow-up visits. This is important. Contact a health care provider if: You have a fever for more than 2-3 days. You feel more thirsty than usual. You feel dizzy or weak. Get help right away if: You have chest pain. You have a fast or irregular heartbeat. You become sweaty or feel light-headed. You feel short of breath. You faint. You have any symptoms of a stroke. "BE FAST" is an easy way to remember the main warning signs of a stroke: B - Balance. Signs are dizziness, sudden trouble walking, or loss of balance. E - Eyes. Signs are trouble seeing or a sudden change in vision. F - Face. Signs are sudden weakness or numbness of the face, or the face or eyelid drooping on one side. A - Arms. Signs are weakness or numbness in an arm. This happens suddenly and usually on one side of the body. S - Speech. Signs are sudden trouble speaking, slurred speech, or trouble understanding what people say. T - Time. Time to call emergency services. Write down what time symptoms started. You have other signs of a stroke, such as: A sudden, severe headache with no known cause. Nausea or vomiting. Seizure. These symptoms may represent a serious problem that is an emergency. Do not wait to see if the symptoms will go away. Get  medical help right away. Call your local emergency services (911 in the [place] Do not drive yourself to the hospital. Summary Orthostatic hypotension is a sudden drop in blood pressure. It can cause light-headedness, sweating, rapid heartbeat, blurred vision, and fainting. Orthostatic hypotension can be diagnosed by having your blood pressure taken while lying down, sitting, and then standing. Treatment may involve changing your diet, wearing compression stockings, sitting up slowly, adjusting your medicines, or correcting the underlying reason for the orthostatic hypotension. Get help right away if you have chest pain, a fast or irregular heartbeat, or symptoms of a stroke. This information is not intended to replace advice given to you by your health care provider. Make sure you discuss any questions you have with your health care provider. Document Revised: 05/24/2020 Document Reviewed: 05/24/2020 Elsevier Patient Education  [place] Revised: 07/19/2020 Document Reviewed: 07/19/2020 Elsevier Patient Education  [place].

## 2021-10-26 LAB — CMP14+EGFR
ALT: 14 IU/L (ref 0–44)
AST: 18 IU/L (ref 0–40)
Albumin/Globulin Ratio: 1.5 (ref 1.2–2.2)
Albumin: 4.1 g/dL (ref 3.8–4.8)
Alkaline Phosphatase: 91 IU/L (ref 44–121)
BUN/Creatinine Ratio: 21 (ref 10–24)
BUN: 40 mg/dL — ABNORMAL HIGH (ref 8–27)
Bilirubin Total: 0.3 mg/dL (ref 0.0–1.2)
CO2: 26 mmol/L (ref 20–29)
Calcium: 9.1 mg/dL (ref 8.6–10.2)
Chloride: 99 mmol/L (ref 96–106)
Creatinine, Ser: 1.9 mg/dL — ABNORMAL HIGH (ref 0.76–1.27)
Globulin, Total: 2.8 g/dL (ref 1.5–4.5)
Glucose: 92 mg/dL (ref 70–99)
Potassium: 4.9 mmol/L (ref 3.5–5.2)
Sodium: 139 mmol/L (ref 134–144)
Total Protein: 6.9 g/dL (ref 6.0–8.5)
eGFR: 35 mL/min/{1.73_m2} — ABNORMAL LOW (ref >59)

## 2021-10-26 LAB — ANEMIA PROFILE B
Basophils Absolute: 0 10*3/uL (ref 0.0–0.2)
Basos: 0 %
EOS (ABSOLUTE): 0.2 10*3/uL (ref 0.0–0.4)
Eos: 4 %
Ferritin: 188 ng/mL (ref 30–400)
Folate: 7.6 ng/mL (ref >3.0)
Hematocrit: 38.6 % (ref 37.5–51.0)
Hemoglobin: 13.1 g/dL (ref 13.0–17.7)
Immature Grans (Abs): 0 10*3/uL (ref 0.0–0.1)
Immature Granulocytes: 0 %
Iron Saturation: 35 % (ref 15–55)
Iron: 108 ug/dL (ref 38–169)
Lymphocytes Absolute: 1.4 10*3/uL (ref 0.7–3.1)
Lymphs: 29 %
MCH: 29.8 pg (ref 26.6–33.0)
MCHC: 33.9 g/dL (ref 31.5–35.7)
MCV: 88 fL (ref 79–97)
Monocytes Absolute: 0.6 10*3/uL (ref 0.1–0.9)
Monocytes: 11 %
Neutrophils Absolute: 2.7 10*3/uL (ref 1.4–7.0)
Neutrophils: 56 %
Platelets: 129 10*3/uL — ABNORMAL LOW (ref 150–450)
RBC: 4.4 x10E6/uL (ref 4.14–5.80)
RDW: 14.3 % (ref 11.6–15.4)
Retic Ct Pct: 1.1 % (ref 0.6–2.6)
Total Iron Binding Capacity: 312 ug/dL (ref 250–450)
UIBC: 204 ug/dL (ref 111–343)
Vitamin B-12: 188 pg/mL — ABNORMAL LOW (ref 232–1245)
WBC: 4.9 10*3/uL (ref 3.4–10.8)

## 2021-10-26 LAB — TSH: TSH: 1.29 u[IU]/mL (ref 0.450–4.500)

## 2021-11-12 ENCOUNTER — Encounter: Payer: Self-pay | Admitting: Family

## 2021-11-12 ENCOUNTER — Ambulatory Visit (INDEPENDENT_AMBULATORY_CARE_PROVIDER_SITE_OTHER): Payer: Medicare Other | Admitting: Family

## 2021-11-12 DIAGNOSIS — R509 Fever, unspecified: Secondary | ICD-10-CM

## 2021-11-12 LAB — VERITOR FLU A/B WAIVED
Influenza A: NEGATIVE
Influenza B: NEGATIVE

## 2021-11-12 NOTE — Progress Notes (Signed)
Virtual Visit  Note Due to COVID-19 pandemic this visit was conducted virtually. This visit type was conducted due to national recommendations for restrictions regarding the COVID-19 Pandemic (e.g. social distancing, sheltering in place) in an effort to limit this patient's exposure and mitigate transmission in our community. All issues noted in this document were discussed and addressed.  A physical exam was not performed with this format.  I connected with Marcus Carr on 11/12/21 at 12:28 pm  by telephone and verified that I am speaking with the correct person using two identifiers. Marcus Carr is currently located at home and wife is currently with him during visit. The provider, [name], FNP is located in their office at time of visit.  I discussed the limitations, risks, security and privacy concerns of performing an evaluation and management service by telephone and the availability of in person appointments. I also discussed with the patient that there may be a patient responsible charge related to this service. The patient expressed understanding and agreed to proceed.  Marcus Carr are scheduled for a virtual visit with your provider today.    Just as we do with appointments in the office, we must obtain your consent to participate.  Your consent will be active for this visit and any virtual visit you may have with one of our providers in the next 365 days.    If you have a MyChart account, I can also send a copy of this consent to you electronically.  All virtual visits are billed to your insurance company just like a traditional visit in the office.  As this is a virtual visit, video technology does not allow for your provider to perform a traditional examination.  This may limit your provider's ability to fully assess your condition.  If your provider identifies any concerns that need to be evaluated in person or the need to arrange testing such as labs, EKG, etc, we  will make arrangements to do so.    Although advances in technology are sophisticated, we cannot ensure that it will always work on either your end or our end.  If the connection with a video visit is poor, we may have to switch to a telephone visit.  With either a video or telephone visit, we are not always able to ensure that we have a secure connection.   I need to obtain your verbal consent now.   Are you willing to proceed with your visit today?   Marcus Carr has provided verbal consent on 11/12/2021 for a virtual visit (video or telephone).   [name], [place] 11/12/2021  12:30 PM   History and Present Illness:  Pt calls the office today with fever that started three days ago. Did a home COVID test that was negative.  Fever  This is a new problem. The current episode started in the past 7 days. The problem has been waxing and waning. The maximum temperature noted was 100 to 100.9 F. Associated symptoms include congestion, headaches, muscle aches, nausea and sleepiness. Pertinent negatives include no coughing, diarrhea, ear pain, sore throat or vomiting. He has tried acetaminophen for the symptoms. The treatment provided mild relief.     Review of Systems  Constitutional:  Positive for fever.  HENT:  Positive for congestion. Negative for ear pain and sore throat.   Respiratory:  Negative for cough.   Gastrointestinal:  Positive for nausea. Negative for diarrhea and vomiting.  Neurological:  Positive for headaches.  Observations/Objective: No SOB or distress noted, slight nasal congestion.   Assessment and Plan: 1. Fever, unspecified fever cause Flu and COVID pending  Tylenol as needed Force fluids Rest Follow up if symptoms worsen or do not improve  - Veritor Flu A/B Waived - Novel Coronavirus, NAA ([facility])     I discussed the assessment and treatment plan with the patient. The patient was provided an opportunity to ask questions and all were answered. The  patient agreed with the plan and demonstrated an understanding of the instructions.   The patient was advised to call back or seek an in-person evaluation if the symptoms worsen or if the condition fails to improve as anticipated.  The above assessment and management plan was discussed with the patient. The patient verbalized understanding of and has agreed to the management plan. Patient is aware to call the clinic if symptoms persist or worsen. Patient is aware when to return to the clinic for a follow-up visit. Patient educated on when it is appropriate to go to the emergency department.   Time call ended:  12:40 pm  I provided 12 minutes of  non face-to-face time during this encounter.    [name], FNP

## 2021-11-13 ENCOUNTER — Telehealth: Payer: Self-pay | Admitting: Family

## 2021-11-13 LAB — NOVEL CORONAVIRUS, NAA: SARS-CoV-2, NAA: NOT DETECTED

## 2021-11-14 ENCOUNTER — Other Ambulatory Visit: Payer: Self-pay | Admitting: Family

## 2021-11-14 MED ORDER — PREDNISONE 10 MG (21) PO TBPK: ORAL_TABLET | ORAL | 0 refills | Status: DC

## 2021-11-14 MED ORDER — DOXYCYCLINE HYCLATE 100 MG PO TABS: 100.0000 mg | ORAL_TABLET | Freq: Two times a day (BID) | ORAL | 0 refills | Status: DC

## 2021-11-14 NOTE — Addendum Note (Signed)
Addended by: [name] A on: 11/14/2021 01:28 PM   Modules accepted: Orders

## 2021-11-14 NOTE — Telephone Encounter (Signed)
Doxycycline Prescription sent to pharmacy   

## 2021-11-18 ENCOUNTER — Ambulatory Visit: Payer: Medicare Other

## 2021-11-18 ENCOUNTER — Telehealth: Payer: Medicare Other

## 2021-11-18 DIAGNOSIS — R350 Frequency of micturition: Secondary | ICD-10-CM | POA: Diagnosis not present

## 2021-11-18 DIAGNOSIS — R058 Other specified cough: Secondary | ICD-10-CM | POA: Diagnosis not present

## 2021-11-18 DIAGNOSIS — E860 Dehydration: Secondary | ICD-10-CM | POA: Diagnosis not present

## 2021-11-18 DIAGNOSIS — R509 Fever, unspecified: Secondary | ICD-10-CM | POA: Diagnosis not present

## 2021-11-18 DIAGNOSIS — R319 Hematuria, unspecified: Secondary | ICD-10-CM | POA: Diagnosis not present

## 2021-11-18 DIAGNOSIS — R110 Nausea: Secondary | ICD-10-CM | POA: Diagnosis not present

## 2021-11-18 DIAGNOSIS — R059 Cough, unspecified: Secondary | ICD-10-CM | POA: Diagnosis not present

## 2021-11-18 DIAGNOSIS — I9589 Other hypotension: Secondary | ICD-10-CM | POA: Diagnosis not present

## 2021-11-19 ENCOUNTER — Encounter (HOSPITAL_COMMUNITY): Payer: Self-pay

## 2021-11-19 ENCOUNTER — Other Ambulatory Visit: Payer: Self-pay

## 2021-11-19 ENCOUNTER — Emergency Department (HOSPITAL_COMMUNITY): Payer: Medicare Other

## 2021-11-19 ENCOUNTER — Inpatient Hospital Stay (HOSPITAL_COMMUNITY)
Admission: EM | Admit: 2021-11-19 | Discharge: 2021-11-24 | DRG: 177 | Disposition: A | Discharge status: Home Health | Payer: Medicare Other | Attending: Family Medicine | Admitting: Family Medicine

## 2021-11-19 DIAGNOSIS — J309 Allergic rhinitis, unspecified: Secondary | ICD-10-CM | POA: Diagnosis present

## 2021-11-19 DIAGNOSIS — U071 COVID-19: Secondary | ICD-10-CM | POA: Diagnosis not present

## 2021-11-19 DIAGNOSIS — J9601 Acute respiratory failure with hypoxia: Secondary | ICD-10-CM | POA: Diagnosis present

## 2021-11-19 DIAGNOSIS — I130 Hypertensive heart and chronic kidney disease with heart failure and stage 1 through stage 4 chronic kidney disease, or unspecified chronic kidney disease: Secondary | ICD-10-CM | POA: Diagnosis not present

## 2021-11-19 DIAGNOSIS — G8929 Other chronic pain: Secondary | ICD-10-CM | POA: Diagnosis not present

## 2021-11-19 DIAGNOSIS — M545 Low back pain, unspecified: Secondary | ICD-10-CM | POA: Diagnosis present

## 2021-11-19 DIAGNOSIS — D649 Anemia, unspecified: Secondary | ICD-10-CM | POA: Diagnosis not present

## 2021-11-19 DIAGNOSIS — Z8249 Family history of ischemic heart disease and other diseases of the circulatory system: Secondary | ICD-10-CM

## 2021-11-19 DIAGNOSIS — N1832 Chronic kidney disease, stage 3b: Secondary | ICD-10-CM | POA: Diagnosis not present

## 2021-11-19 DIAGNOSIS — Z6841 Body Mass Index (BMI) 40.0 and over, adult: Secondary | ICD-10-CM

## 2021-11-19 DIAGNOSIS — N281 Cyst of kidney, acquired: Secondary | ICD-10-CM | POA: Diagnosis not present

## 2021-11-19 DIAGNOSIS — E669 Obesity, unspecified: Secondary | ICD-10-CM | POA: Diagnosis present

## 2021-11-19 DIAGNOSIS — T380X5A Adverse effect of glucocorticoids and synthetic analogues, initial encounter: Secondary | ICD-10-CM | POA: Diagnosis not present

## 2021-11-19 DIAGNOSIS — I129 Hypertensive chronic kidney disease with stage 1 through stage 4 chronic kidney disease, or unspecified chronic kidney disease: Secondary | ICD-10-CM | POA: Diagnosis not present

## 2021-11-19 DIAGNOSIS — Z880 Allergy status to penicillin: Secondary | ICD-10-CM

## 2021-11-19 DIAGNOSIS — I959 Hypotension, unspecified: Secondary | ICD-10-CM | POA: Diagnosis present

## 2021-11-19 DIAGNOSIS — I10 Essential (primary) hypertension: Secondary | ICD-10-CM | POA: Diagnosis not present

## 2021-11-19 DIAGNOSIS — N189 Chronic kidney disease, unspecified: Secondary | ICD-10-CM

## 2021-11-19 DIAGNOSIS — F32A Depression, unspecified: Secondary | ICD-10-CM | POA: Diagnosis not present

## 2021-11-19 DIAGNOSIS — R739 Hyperglycemia, unspecified: Secondary | ICD-10-CM | POA: Diagnosis not present

## 2021-11-19 DIAGNOSIS — J440 Chronic obstructive pulmonary disease with acute lower respiratory infection: Secondary | ICD-10-CM | POA: Diagnosis present

## 2021-11-19 DIAGNOSIS — J159 Unspecified bacterial pneumonia: Secondary | ICD-10-CM | POA: Diagnosis present

## 2021-11-19 DIAGNOSIS — J690 Pneumonitis due to inhalation of food and vomit: Secondary | ICD-10-CM | POA: Diagnosis not present

## 2021-11-19 DIAGNOSIS — E785 Hyperlipidemia, unspecified: Secondary | ICD-10-CM | POA: Diagnosis present

## 2021-11-19 DIAGNOSIS — Z7982 Long term (current) use of aspirin: Secondary | ICD-10-CM

## 2021-11-19 DIAGNOSIS — Z800 Family history of malignant neoplasm of digestive organs: Secondary | ICD-10-CM

## 2021-11-19 DIAGNOSIS — I5032 Chronic diastolic (congestive) heart failure: Secondary | ICD-10-CM | POA: Diagnosis present

## 2021-11-19 DIAGNOSIS — J9811 Atelectasis: Secondary | ICD-10-CM | POA: Diagnosis not present

## 2021-11-19 DIAGNOSIS — J441 Chronic obstructive pulmonary disease with (acute) exacerbation: Secondary | ICD-10-CM | POA: Diagnosis not present

## 2021-11-19 DIAGNOSIS — N201 Calculus of ureter: Secondary | ICD-10-CM | POA: Diagnosis not present

## 2021-11-19 DIAGNOSIS — R531 Weakness: Secondary | ICD-10-CM | POA: Diagnosis not present

## 2021-11-19 DIAGNOSIS — N179 Acute kidney failure, unspecified: Secondary | ICD-10-CM | POA: Diagnosis not present

## 2021-11-19 DIAGNOSIS — J189 Pneumonia, unspecified organism: Secondary | ICD-10-CM | POA: Diagnosis not present

## 2021-11-19 DIAGNOSIS — N200 Calculus of kidney: Secondary | ICD-10-CM | POA: Diagnosis not present

## 2021-11-19 DIAGNOSIS — J1282 Pneumonia due to coronavirus disease 2019: Secondary | ICD-10-CM | POA: Diagnosis present

## 2021-11-19 DIAGNOSIS — D631 Anemia in chronic kidney disease: Secondary | ICD-10-CM | POA: Diagnosis present

## 2021-11-19 DIAGNOSIS — Z79899 Other long term (current) drug therapy: Secondary | ICD-10-CM

## 2021-11-19 DIAGNOSIS — M7989 Other specified soft tissue disorders: Secondary | ICD-10-CM | POA: Diagnosis not present

## 2021-11-19 DIAGNOSIS — Z87891 Personal history of nicotine dependence: Secondary | ICD-10-CM

## 2021-11-19 DIAGNOSIS — R519 Headache, unspecified: Secondary | ICD-10-CM | POA: Diagnosis present

## 2021-11-19 LAB — PROTIME-INR
INR: 1.1 (ref 0.8–1.2)
Prothrombin Time: 14.2 seconds (ref 11.4–15.2)

## 2021-11-19 LAB — COMPREHENSIVE METABOLIC PANEL
ALT: 24 U/L (ref 0–44)
AST: 22 U/L (ref 15–41)
Albumin: 3 g/dL — ABNORMAL LOW (ref 3.5–5.0)
Alkaline Phosphatase: 52 U/L (ref 38–126)
Anion gap: 13 (ref 5–15)
BUN: 66 mg/dL — ABNORMAL HIGH (ref 8–23)
CO2: 22 mmol/L (ref 22–32)
Calcium: 8.5 mg/dL — ABNORMAL LOW (ref 8.9–10.3)
Chloride: 100 mmol/L (ref 98–111)
Creatinine, Ser: 5.54 mg/dL — ABNORMAL HIGH (ref 0.61–1.24)
GFR, Estimated: 10 mL/min — ABNORMAL LOW (ref >60)
Glucose, Bld: 135 mg/dL — ABNORMAL HIGH (ref 70–99)
Potassium: 4.1 mmol/L (ref 3.5–5.1)
Sodium: 135 mmol/L (ref 135–145)
Total Bilirubin: 0.9 mg/dL (ref 0.3–1.2)
Total Protein: 6 g/dL — ABNORMAL LOW (ref 6.5–8.1)

## 2021-11-19 LAB — CBC WITH DIFFERENTIAL/PLATELET
Abs Immature Granulocytes: 0.04 10*3/uL (ref 0.00–0.07)
Basophils Absolute: 0 10*3/uL (ref 0.0–0.1)
Basophils Relative: 0 %
Eosinophils Absolute: 0 10*3/uL (ref 0.0–0.5)
Eosinophils Relative: 1 %
HCT: 37.9 % — ABNORMAL LOW (ref 39.0–52.0)
Hemoglobin: 12.5 g/dL — ABNORMAL LOW (ref 13.0–17.0)
Immature Granulocytes: 1 %
Lymphocytes Relative: 8 %
Lymphs Abs: 0.7 10*3/uL (ref 0.7–4.0)
MCH: 30 pg (ref 26.0–34.0)
MCHC: 33 g/dL (ref 30.0–36.0)
MCV: 90.9 fL (ref 80.0–100.0)
Monocytes Absolute: 0.5 10*3/uL (ref 0.1–1.0)
Monocytes Relative: 6 %
Neutro Abs: 7.3 10*3/uL (ref 1.7–7.7)
Neutrophils Relative %: 84 %
Platelets: 139 10*3/uL — ABNORMAL LOW (ref 150–400)
RBC: 4.17 MIL/uL — ABNORMAL LOW (ref 4.22–5.81)
RDW: 14.3 % (ref 11.5–15.5)
WBC: 8.6 10*3/uL (ref 4.0–10.5)
nRBC: 0 % (ref 0.0–0.2)

## 2021-11-19 LAB — LACTIC ACID, PLASMA
Lactic Acid, Venous: 2.4 mmol/L (ref 0.5–1.9)
Lactic Acid, Venous: 1.6 mmol/L (ref 0.5–1.9)

## 2021-11-19 LAB — SARS CORONAVIRUS 2 BY RT PCR: SARS Coronavirus 2 by RT PCR: POSITIVE — AB

## 2021-11-19 MED ORDER — SODIUM CHLORIDE 0.9 % IV SOLN
1.0000 g | Freq: Once | INTRAVENOUS | Status: AC
  Administered 2021-11-19: 1 g via INTRAVENOUS
  Filled 2021-11-19: qty 10

## 2021-11-19 MED ORDER — ACETAMINOPHEN 650 MG RE SUPP: 650.0000 mg | Freq: Four times a day (QID) | RECTAL | Status: DC | PRN

## 2021-11-19 MED ORDER — ACETAMINOPHEN 325 MG PO TABS
650.0000 mg | ORAL_TABLET | Freq: Four times a day (QID) | ORAL | Status: DC | PRN
  Administered 2021-11-21: 650 mg via ORAL
  Filled 2021-11-19: qty 2

## 2021-11-19 MED ORDER — SODIUM CHLORIDE 0.9 % IV BOLUS
1000.0000 mL | Freq: Once | INTRAVENOUS | Status: AC
  Administered 2021-11-19: 1000 mL via INTRAVENOUS

## 2021-11-19 MED ORDER — ALBUTEROL SULFATE (2.5 MG/3ML) 0.083% IN NEBU: 2.5000 mg | INHALATION_SOLUTION | RESPIRATORY_TRACT | Status: DC | PRN

## 2021-11-19 MED ORDER — SODIUM CHLORIDE 0.9 % IV SOLN
1.0000 g | INTRAVENOUS | Status: DC
  Administered 2021-11-20 – 2021-11-23 (×4): 1 g via INTRAVENOUS
  Filled 2021-11-19 (×4): qty 10

## 2021-11-19 MED ORDER — SODIUM CHLORIDE 0.9 % IV SOLN: INTRAVENOUS | Status: DC

## 2021-11-19 MED ORDER — SODIUM CHLORIDE 0.9 % IV SOLN: 500.0000 mg | INTRAVENOUS | Status: DC

## 2021-11-19 MED ORDER — SODIUM CHLORIDE 0.9 % IV SOLN
500.0000 mg | Freq: Once | INTRAVENOUS | Status: AC
  Administered 2021-11-19: 500 mg via INTRAVENOUS
  Filled 2021-11-19: qty 5

## 2021-11-19 NOTE — ED Notes (Signed)
Family member questioning about when patient is going upstairs upon arrival to ER room. RN explained patient has not been admitted yet. RN proceeded to obtain pending lab work and infuse antibiotics.

## 2021-11-19 NOTE — ED Notes (Signed)
Family member questioning about when patient will get bed upstairs. RN explained reasons of delay and will inform them as soon there is a bed ready for the patient.   Provided patient with another warm blanket.   NAD at this time.

## 2021-11-19 NOTE — ED Triage Notes (Addendum)
Patient has been sick for the past couple weeks.  Has been tested for covid and flu which was negative.  Family reports he has had fever, cough trouble breathing, low bp +nausea Went to UC yesterday.  Patients bp was low yesterday so UC instructed him to not take him bp meds or lasix today Patient is Hypertensive in triage.

## 2021-11-19 NOTE — ED Provider Notes (Signed)
[facility] EMERGENCY DEPARTMENT Provider Note   CSN: [id number] Arrival date & time: 11/19/21  1655     History  Chief Complaint  Patient presents with   Fever    Marcus Carr is a 81 y.o. male.  Patient with history of COPD, hypertension, chronic kidney disease --presents to the emergency department for evaluation of generalized weakness, ongoing fevers and decreased urination.  Patient has had intermittent fevers of 100-101 F, over at least the past 2 weeks.  Seen by PCP on 11/12/2021 and was started on doxycycline.  He has been taking this. During that visit reported congestion, headaches, muscle aches and nausea without vomiting.  Doxycycline was called in on 8/24.  Followed up at urgent care yesterday with persistent symptoms.  At that time dizziness reported and intermittent confusion.  Fevers are ongoing and patient complained of cough.  Reports decreased oral intake.  Found to have low blood pressure at that time 92/40 with pulse 64.  Encouraged patient to go to the ED at that time however he refused.  Symptoms continued today and patient decided to be seen.  At yesterday's visit, patient was prescribed Cipro, apparently for presumptive UTI, however family has not yet filled this.  Patient continues to take antihypertensive medications prior to today, as well as Lasix.  Family thinks that he is on a lower dose than the '40mg'$  which is listed in his medical record.       Home Medications Prior to Admission medications   Medication Sig Start Date End Date Taking? Authorizing Provider  albuterol (VENTOLIN HFA) 108 (90 Base) MCG/ACT inhaler INHALE 2 PUFFS INTO THE LUNGS EVERY 6 HOURS AS NEEDED FOR WHEEZE OR SHORTNESS OF BREATH 09/02/21   Hawks, Alyse Low A, FNP  Albuterol Sulfate (PROAIR RESPICLICK) 761 (90 Base) MCG/ACT AEPB Inhale 1-2 puffs into the lungs every 6 (six) weeks. prn 06/25/21   [name] A, FNP  aspirin 325 MG tablet Take 325 mg by mouth daily.     [provider]  atorvastatin (LIPITOR) 20 MG tablet TAKE 1 TABLET BY MOUTH EVERY DAY 08/05/21   [name] A, FNP  cetirizine (ZYRTEC) 10 MG tablet Take 1 tablet (10 mg total) by mouth daily. 01/28/21   [name] A, FNP  cholecalciferol (VITAMIN D) 400 UNITS TABS tablet Take 800 Units by mouth daily.    [provider]  citalopram (CELEXA) 20 MG tablet TAKE 1 TABLET BY MOUTH EVERY DAY 09/12/21   [name] A, FNP  diclofenac sodium (VOLTAREN) 1 % GEL Apply 4 g topically 4 (four) times daily. To left knee for pain 04/14/18   Ronnie Doss M, DO  doxycycline (VIBRA-TABS) 100 MG tablet Take 1 tablet (100 mg total) by mouth 2 (two) times daily. 11/14/21   Sharion Balloon, FNP  fish oil-omega-3 fatty acids 1000 MG capsule Take 1 g by mouth 2 (two) times daily.     [provider]  fluticasone (FLONASE) 50 MCG/ACT nasal spray SPRAY 2 SPRAYS INTO EACH NOSTRIL EVERY DAY 10/22/21   [name] A, FNP  furosemide (LASIX) 40 MG tablet TAKE 1 TABLET BY MOUTH EVERY DAY 08/05/21   [name] A, FNP  lisinopril (ZESTRIL) 10 MG tablet Take 1 tablet (10 mg total) by mouth daily. 10/25/21   [name] A, FNP  oxyCODONE-acetaminophen (PERCOCET) 10-325 MG per tablet Take 1 tablet by mouth every 12 (twelve) hours as needed. 10/30/14   Sharion Balloon, FNP  predniSONE (STERAPRED UNI-PAK 21  TAB) 10 MG (21) TBPK tablet Use as directed 11/14/21   Sharion Balloon, FNP  Specialty Vitamins Products (MAGNESIUM, AMINO ACID CHELATE,) 133 MG tablet Take 1 tablet by mouth daily.    [provider]  verapamil (CALAN-SR) 180 MG CR tablet TAKE 1 TABLET BY MOUTH DAILY 07/08/21   Sharion Balloon, FNP      Allergies    Penicillins    Review of Systems   Review of Systems  Physical Exam Updated Vital Signs BP (!) 224/201   Pulse 83   Temp 98.2 F (36.8 C) (Oral)   Resp 16   Ht '5\' 8"'$  (1.727 m)   Wt 127.5 kg   SpO2 95%   BMI 42.73 kg/m   Physical Exam Vitals and  nursing note reviewed.  Constitutional:      General: He is not in acute distress.    Appearance: He is well-developed.  HENT:     Head: Normocephalic and atraumatic.     Right Ear: Tympanic membrane, ear canal and external ear normal.     Left Ear: Tympanic membrane, ear canal and external ear normal.     Mouth/Throat:     Mouth: Mucous membranes are dry.  Eyes:     General:        Right eye: No discharge.        Left eye: No discharge.     Conjunctiva/sclera: Conjunctivae normal.  Cardiovascular:     Rate and Rhythm: Normal rate and regular rhythm.     Heart sounds: Normal heart sounds.  Pulmonary:     Effort: Pulmonary effort is normal.     Breath sounds: Normal breath sounds.  Abdominal:     Palpations: Abdomen is soft.     Tenderness: There is no abdominal tenderness. There is no guarding or rebound.  Musculoskeletal:        General: No swelling.     Cervical back: Normal range of motion and neck supple.     Right lower leg: No edema.     Left lower leg: No edema.  Skin:    General: Skin is warm and dry.     Findings: No rash.  Neurological:     General: No focal deficit present.     Mental Status: He is alert. Mental status is at baseline.     ED Results / Procedures / Treatments   Labs (all labs ordered are listed, but only abnormal results are displayed) Labs Reviewed  SARS CORONAVIRUS 2 BY RT PCR - Abnormal; Notable for the following components:      Result Value   SARS Coronavirus 2 by RT PCR POSITIVE (*)    All other components within normal limits  COMPREHENSIVE METABOLIC PANEL - Abnormal; Notable for the following components:   Glucose, Bld 135 (*)    BUN 66 (*)    Creatinine, Ser 5.54 (*)    Calcium 8.5 (*)    Total Protein 6.0 (*)    Albumin 3.0 (*)    GFR, Estimated 10 (*)    All other components within normal limits  LACTIC ACID, PLASMA - Abnormal; Notable for the following components:   Lactic Acid, Venous 2.4 (*)    All other components  within normal limits  CBC WITH DIFFERENTIAL/PLATELET - Abnormal; Notable for the following components:   RBC 4.17 (*)    Hemoglobin 12.5 (*)    HCT 37.9 (*)    Platelets 139 (*)    All other components within  normal limits  CULTURE, BLOOD (ROUTINE X 2)  CULTURE, BLOOD (ROUTINE X 2)  LACTIC ACID, PLASMA  PROTIME-INR  URINALYSIS, ROUTINE W REFLEX MICROSCOPIC    EKG EKG Interpretation  Date/Time:  Tuesday November 19 2021 17:51:52 EDT Ventricular Rate:  79 PR Interval:  154 QRS Duration: 80 QT Interval:  388 QTC Calculation: 444 R Axis:   119 Text Interpretation: Normal sinus rhythm Right axis deviation Nonspecific ST and T wave abnormality Abnormal ECG No previous ECGs available no prior ECG for comparison. No STEMI Confirmed by Antony Blackbird [phone]) on 11/19/2021 7:46:51 PM  Radiology DG Chest 2 View  Result Date: 11/19/2021 CLINICAL DATA:  Suspected sepsis EXAM: CHEST - 2 VIEW COMPARISON:  Chest 11/18/2021 FINDINGS: Cardiac enlargement without heart failure. Mild right perihilar airspace disease could represent pneumonia. Mild right lower lobe atelectasis with elevated right hemidiaphragm. Mild left lower lobe atelectasis. No effusion. IMPRESSION: Question early right perihilar infiltrate. Right lower lobe atelectasis also present. Electronically Signed   By: Franchot Gallo M.D.   On: 11/19/2021 18:15    Procedures Procedures    Medications Ordered in ED Medications  0.9 %  sodium chloride infusion (has no administration in time range)  sodium chloride 0.9 % bolus 1,000 mL (1,000 mLs Intravenous New Bag/Given 11/19/21 2036)  cefTRIAXone (ROCEPHIN) 1 g in sodium chloride 0.9 % 100 mL IVPB (0 g Intravenous Stopped 11/19/21 2037)  azithromycin (ZITHROMAX) 500 mg in sodium chloride 0.9 % 250 mL IVPB (500 mg Intravenous New Bag/Given 11/19/21 2037)    ED Course/ Medical Decision Making/ A&P    Patient seen and examined. History obtained directly from patient, family at bedside to  contribute additional history, and recent PCP and urgent care notes.  Work-up including labs, imaging, EKG ordered in triage, if performed, were reviewed.    Labs/EKG: Independently reviewed and interpreted.  This included: CBC with normal white blood cell count, hemoglobin 12.5, platelets 139; CMP with acute on chronic kidney injury with creatinine elevated to 5.5 and BUN of 66, normal electrolytes, normal liver function testing; INR 1.1.  Added COVID testing.  Blood cultures pending.  Will need UA.  Recent outpatient dipsticks demonstrated blood without obvious infection.  Imaging: Independently visualized and interpreted.  This included: Chest x-ray, agree right perihilar infiltrate.  Added CT chest, abdomen, pelvis without contrast to further evaluate possibility of pneumonia as well as to look for possible etiology of hematuria.  Medications/Fluids: Ordered: IV normal saline bolus, IV ceftriaxone and azithromycin to treat for pneumonia.   Most recent vital signs reviewed and are as follows: BP (!) 224/201   Pulse 83   Temp 98.2 F (36.8 C) (Oral)   Resp 16   Ht '5\' 8"'$  (1.727 m)   Wt 127.5 kg   SpO2 95%   BMI 42.73 kg/m   Question accuracy of BP on arrival. Rechecked BP was 90/50.   Initial impression: Generalized weakness, new AKI in setting of chronic kidney disease baseline creatinine 1.9, recent fever possible pneumonia.  10:36 PM Reassessment performed. Patient appears stable during ED stay and evaluation.  Labs personally reviewed and interpreted including: COVID testing positive; lactate 2.4, improved after IV fluid.  Imaging personally visualized and interpreted including: CT chest, abdomen, pelvis without contrast --suggestive of right sided pneumonia and renal cysts.  No other intra-abdominal problem.  Reviewed pertinent lab work and imaging with patient at bedside. Questions answered.   Most current vital signs reviewed and are as follows: BP (!) 106/58   Pulse 71  Temp 98.3 F (36.8 C) (Oral)   Resp 19   Ht '5\' 8"'$  (1.727 m)   Wt 127.5 kg   SpO2 96%   BMI 42.73 kg/m   Plan: Admit to hospital.  Will defer COVID treatment to inpatient team given AKI and greater than 2 weeks of symptoms.  Patient discussed with: Dr. Sherry Ruffing.   Consulted with Dr. Velia Meyer of [facility] by telephone.  Will see patient for admission.                          Medical Decision Making Amount and/or Complexity of Data Reviewed Labs: ordered. Radiology: ordered.  Risk Prescription drug management. Decision regarding hospitalization.   Patient with acute on chronic kidney injury, likely in part due to medications and poor fluid intake over the past 2 weeks with his illness.  Patient tested positive for COVID tonight.  He has a right-sided pneumonia and mild hypoxia, likely exacerbated by underlying COPD.        Final Clinical Impression(s) / ED Diagnoses Final diagnoses:  COVID-19  Community acquired pneumonia of right lung, unspecified part of lung  Acute renal failure superimposed on chronic kidney disease, unspecified CKD stage, unspecified acute renal failure type [facility])    Rx / DC Orders ED Discharge Orders     None         Carlisle Cater, PA-C 11/19/21 2243    Tegeler, Gwenyth Allegra, MD 11/19/21 2326

## 2021-11-19 NOTE — ED Notes (Signed)
Patient placed on [place] 2L due to o2 sats in 88%s.

## 2021-11-19 NOTE — ED Notes (Addendum)
Family member questioning about when patient will get bed upstairs. RN explained reasons of delay and will inform them as soon there is a bed ready for the patient.

## 2021-11-19 NOTE — ED Notes (Signed)
Patient transported to CT 

## 2021-11-19 NOTE — ED Provider Triage Note (Signed)
Emergency Medicine Provider Triage Evaluation Note  Marcus Carr , a 81 y.o. male  was evaluated in triage.  Pt complains of a couple weeks of illness.  Had shortness of breath, cough, fever and nausea.  Went to urgent care yesterday and found to be hypotensive to 90s over 40s.  Was told to hold his antihypertensives and Lasix.  Arrives here hypertensive 224/201  Review of Systems  Positive: URI symptoms Negative: Headache, blurred vision, chest pain  Physical Exam  BP (!) 224/201   Pulse 83   Temp 98.2 F (36.8 C) (Oral)   Resp 16   Ht '5\' 8"'$  (1.727 m)   Wt 127.5 kg   SpO2 95%   BMI 42.73 kg/m  Gen:   Awake, no distress   Resp:  Normal effort  MSK:   Moves extremities without difficulty  Other:  Mildly increased work of breathing.  RRR  Medical Decision Making  Medically screening exam initiated at 5:33 PM.  Appropriate orders placed.  Marcus Carr was informed that the remainder of the evaluation will be completed by another provider, this initial triage assessment does not replace that evaluation, and the importance of remaining in the ED until their evaluation is complete.   Charge notified that patient needs next room due to blood pressure   Marcus Carr A, PA-C 11/19/21 1736

## 2021-11-20 ENCOUNTER — Encounter (HOSPITAL_COMMUNITY): Payer: Self-pay | Admitting: Internal Medicine

## 2021-11-20 DIAGNOSIS — D649 Anemia, unspecified: Secondary | ICD-10-CM | POA: Diagnosis present

## 2021-11-20 DIAGNOSIS — U071 COVID-19: Secondary | ICD-10-CM | POA: Diagnosis present

## 2021-11-20 DIAGNOSIS — N1832 Chronic kidney disease, stage 3b: Secondary | ICD-10-CM | POA: Diagnosis present

## 2021-11-20 DIAGNOSIS — J309 Allergic rhinitis, unspecified: Secondary | ICD-10-CM | POA: Diagnosis present

## 2021-11-20 DIAGNOSIS — J189 Pneumonia, unspecified organism: Secondary | ICD-10-CM | POA: Diagnosis not present

## 2021-11-20 DIAGNOSIS — I5032 Chronic diastolic (congestive) heart failure: Secondary | ICD-10-CM | POA: Diagnosis present

## 2021-11-20 DIAGNOSIS — J441 Chronic obstructive pulmonary disease with (acute) exacerbation: Secondary | ICD-10-CM | POA: Diagnosis present

## 2021-11-20 DIAGNOSIS — J9601 Acute respiratory failure with hypoxia: Secondary | ICD-10-CM | POA: Diagnosis present

## 2021-11-20 DIAGNOSIS — R531 Weakness: Secondary | ICD-10-CM

## 2021-11-20 LAB — COMPREHENSIVE METABOLIC PANEL
ALT: 18 U/L (ref 0–44)
AST: 17 U/L (ref 15–41)
Albumin: 2.2 g/dL — ABNORMAL LOW (ref 3.5–5.0)
Alkaline Phosphatase: 38 U/L (ref 38–126)
Anion gap: 14 (ref 5–15)
BUN: 62 mg/dL — ABNORMAL HIGH (ref 8–23)
CO2: 20 mmol/L — ABNORMAL LOW (ref 22–32)
Calcium: 6.7 mg/dL — ABNORMAL LOW (ref 8.9–10.3)
Chloride: 105 mmol/L (ref 98–111)
Creatinine, Ser: 4.86 mg/dL — ABNORMAL HIGH (ref 0.61–1.24)
GFR, Estimated: 11 mL/min — ABNORMAL LOW (ref >60)
Glucose, Bld: 105 mg/dL — ABNORMAL HIGH (ref 70–99)
Potassium: 3.5 mmol/L (ref 3.5–5.1)
Sodium: 139 mmol/L (ref 135–145)
Total Bilirubin: 0.7 mg/dL (ref 0.3–1.2)
Total Protein: 4.7 g/dL — ABNORMAL LOW (ref 6.5–8.1)

## 2021-11-20 LAB — I-STAT VENOUS BLOOD GAS, ED
Acid-base deficit: 3 mmol/L — ABNORMAL HIGH (ref 0.0–2.0)
Bicarbonate: 22.6 mmol/L (ref 20.0–28.0)
Calcium, Ion: 1.05 mmol/L — ABNORMAL LOW (ref 1.15–1.40)
HCT: 31 % — ABNORMAL LOW (ref 39.0–52.0)
Hemoglobin: 10.5 g/dL — ABNORMAL LOW (ref 13.0–17.0)
O2 Saturation: 92 %
Potassium: 4.2 mmol/L (ref 3.5–5.1)
Sodium: 137 mmol/L (ref 135–145)
TCO2: 24 mmol/L (ref 22–32)
pCO2, Ven: 43.5 mmHg — ABNORMAL LOW (ref 44–60)
pH, Ven: 7.323 (ref 7.25–7.43)
pO2, Ven: 70 mmHg — ABNORMAL HIGH (ref 32–45)

## 2021-11-20 LAB — CBC WITH DIFFERENTIAL/PLATELET
Abs Immature Granulocytes: 0.06 10*3/uL (ref 0.00–0.07)
Basophils Absolute: 0 10*3/uL (ref 0.0–0.1)
Basophils Relative: 0 %
Eosinophils Absolute: 0.1 10*3/uL (ref 0.0–0.5)
Eosinophils Relative: 1 %
HCT: 32.8 % — ABNORMAL LOW (ref 39.0–52.0)
Hemoglobin: 10.4 g/dL — ABNORMAL LOW (ref 13.0–17.0)
Immature Granulocytes: 1 %
Lymphocytes Relative: 7 %
Lymphs Abs: 0.4 10*3/uL — ABNORMAL LOW (ref 0.7–4.0)
MCH: 29.5 pg (ref 26.0–34.0)
MCHC: 31.7 g/dL (ref 30.0–36.0)
MCV: 92.9 fL (ref 80.0–100.0)
Monocytes Absolute: 0.4 10*3/uL (ref 0.1–1.0)
Monocytes Relative: 7 %
Neutro Abs: 5.1 10*3/uL (ref 1.7–7.7)
Neutrophils Relative %: 84 %
Platelets: 103 10*3/uL — ABNORMAL LOW (ref 150–400)
RBC: 3.53 MIL/uL — ABNORMAL LOW (ref 4.22–5.81)
RDW: 14.3 % (ref 11.5–15.5)
WBC: 6 10*3/uL (ref 4.0–10.5)
nRBC: 0 % (ref 0.0–0.2)

## 2021-11-20 LAB — MAGNESIUM: Magnesium: 1.5 mg/dL — ABNORMAL LOW (ref 1.7–2.4)

## 2021-11-20 LAB — PROCALCITONIN: Procalcitonin: 0.97 ng/mL

## 2021-11-20 LAB — LACTIC ACID, PLASMA: Lactic Acid, Venous: 0.9 mmol/L (ref 0.5–1.9)

## 2021-11-20 LAB — TSH: TSH: 0.595 u[IU]/mL (ref 0.350–4.500)

## 2021-11-20 LAB — C-REACTIVE PROTEIN: CRP: 16.9 mg/dL — ABNORMAL HIGH (ref <1.0)

## 2021-11-20 LAB — SODIUM, URINE, RANDOM: Sodium, Ur: 30 mmol/L

## 2021-11-20 LAB — STREP PNEUMONIAE URINARY ANTIGEN: Strep Pneumo Urinary Antigen: NEGATIVE

## 2021-11-20 LAB — PHOSPHORUS: Phosphorus: 4.1 mg/dL (ref 2.5–4.6)

## 2021-11-20 LAB — CREATININE, URINE, RANDOM: Creatinine, Urine: 138 mg/dL

## 2021-11-20 LAB — D-DIMER, QUANTITATIVE: D-Dimer, Quant: 2.76 ug/mL-FEU — ABNORMAL HIGH (ref 0.00–0.50)

## 2021-11-20 LAB — BRAIN NATRIURETIC PEPTIDE: B Natriuretic Peptide: 45.9 pg/mL (ref 0.0–100.0)

## 2021-11-20 MED ORDER — OXYCODONE HCL 5 MG PO TABS
5.0000 mg | ORAL_TABLET | Freq: Two times a day (BID) | ORAL | Status: DC | PRN
  Administered 2021-11-20: 5 mg via ORAL
  Filled 2021-11-20: qty 1

## 2021-11-20 MED ORDER — METHYLPREDNISOLONE SODIUM SUCC 125 MG IJ SOLR
80.0000 mg | Freq: Two times a day (BID) | INTRAMUSCULAR | Status: DC
  Administered 2021-11-20 – 2021-11-22 (×5): 80 mg via INTRAVENOUS
  Filled 2021-11-20 (×5): qty 2

## 2021-11-20 MED ORDER — IPRATROPIUM-ALBUTEROL 0.5-2.5 (3) MG/3ML IN SOLN
3.0000 mL | Freq: Four times a day (QID) | RESPIRATORY_TRACT | Status: DC
  Administered 2021-11-20 – 2021-11-22 (×9): 3 mL via RESPIRATORY_TRACT
  Filled 2021-11-20 (×9): qty 3

## 2021-11-20 MED ORDER — OXYCODONE-ACETAMINOPHEN 10-325 MG PO TABS: 1.0000 | ORAL_TABLET | Freq: Two times a day (BID) | ORAL | Status: DC | PRN

## 2021-11-20 MED ORDER — FLUTICASONE PROPIONATE 50 MCG/ACT NA SUSP
1.0000 | Freq: Every day | NASAL | Status: DC
  Administered 2021-11-21 – 2021-11-24 (×2): 1 via NASAL
  Filled 2021-11-20 (×3): qty 16

## 2021-11-20 MED ORDER — AZITHROMYCIN 500 MG PO TABS
500.0000 mg | ORAL_TABLET | Freq: Every day | ORAL | Status: DC
  Administered 2021-11-20 – 2021-11-23 (×4): 500 mg via ORAL
  Filled 2021-11-20 (×4): qty 1

## 2021-11-20 MED ORDER — SODIUM CHLORIDE 0.9 % IV BOLUS
1000.0000 mL | Freq: Once | INTRAVENOUS | Status: AC
  Administered 2021-11-20: 1000 mL via INTRAVENOUS

## 2021-11-20 MED ORDER — ATORVASTATIN CALCIUM 10 MG PO TABS
20.0000 mg | ORAL_TABLET | Freq: Every day | ORAL | Status: DC
  Administered 2021-11-20 – 2021-11-24 (×5): 20 mg via ORAL
  Filled 2021-11-20 (×5): qty 2

## 2021-11-20 MED ORDER — HYDROMORPHONE HCL 2 MG PO TABS
1.0000 mg | ORAL_TABLET | Freq: Four times a day (QID) | ORAL | Status: DC | PRN
  Administered 2021-11-20 – 2021-11-23 (×3): 1 mg via ORAL
  Filled 2021-11-20 (×3): qty 1

## 2021-11-20 MED ORDER — BENZONATATE 100 MG PO CAPS: 200.0000 mg | ORAL_CAPSULE | Freq: Three times a day (TID) | ORAL | Status: DC | PRN

## 2021-11-20 MED ORDER — OXYCODONE-ACETAMINOPHEN 5-325 MG PO TABS
1.0000 | ORAL_TABLET | Freq: Two times a day (BID) | ORAL | Status: DC | PRN
  Administered 2021-11-20: 1 via ORAL
  Filled 2021-11-20: qty 1

## 2021-11-20 NOTE — ED Notes (Signed)
Pt left dept via stretcher with transporter. IVF infusing. All belongings with pt and pt wife ambulated with pt and transporter to unit.

## 2021-11-20 NOTE — ED Notes (Signed)
Called to room and pt and wife is requesting that he have an oxycodone to have for pain. That he takes it at home and hasn't had one since yesterday morning. Advised this RN would have to see what he has ordered and if it isn't order will have to send a message to the provider to request meds. Was asked by wife how long would it take advised unsure it would depend on several factors. Provider sent a message reference same

## 2021-11-20 NOTE — ED Notes (Signed)
Assisted pt to turn and roll at this time. Pt placed in hospital gown. Bed sheets changed. Pt HOB elevated. Call bell in reach. Pt denies any other needs at this time

## 2021-11-20 NOTE — ED Notes (Signed)
Received verbal report from Jonathon Resides

## 2021-11-20 NOTE — Progress Notes (Signed)
RT instructed patient and family member on the use of a flutter valve. Patient able to demonstrate back good technique.

## 2021-11-20 NOTE — ED Notes (Signed)
Pt diaphoretic hypotensive. Dr. Velia Meyer made aware and at bedside at this time

## 2021-11-20 NOTE — H&P (Signed)
History and Physical    PLEASE NOTE THAT [employer]AGON DICT[employer] SOFTWARE WAS USED IN THE CONSTRUCTION OF THIS NOTE.   Marcus Carr TZG:[id number] DOB: February 15, 1941 DOA: 11/19/2021  PCP: Sharion Balloon, FNP  Patient coming from: home   I have personally briefly reviewed patient's old medical records in [place]  Chief Complaint: Generalized weakness  HPI: Marcus Carr is a 81 y.o. male with medical history significant for CKD 3B with baseline creatinine 1.5-1.9, COPD, essential hypertension, hyperlipidemia, allergic rhinitis, chronic back pain on chronic opioid therapy, chronic diastolic heart failure, who is admitted to [facility] on 11/19/2021 with community-acquired pneumonia after presenting from home to [facility] ED complaining of generalized weakness.   The following history is provided by the as well as my discussions with the patient's wife, who is present at bedside in addition to my discussions with the EDP and via chart review.  Patient reports approximately 2 weeks of generalized weakness in the absence of any acute focal weakness.  He reports that this has been associated with subjective fever over that timeframe, in the absence of chills, full body rigors, or generalized myalgias.  He also notes associated mildly productive cough.  Approximately 10 days ago he had noted some rhinitis/rhinorrhea, although this has subsequently improved.  He also notes, over the last 4 to 5 days, mild shortness of breath in the absence of orthopnea, PND, or onset peripheral edema.  No recent chest pain, hemoptysis, or wheezing.  He also notes diminished appetite over the last week, resulting in significant reduction in intake of both food and water over that timeframe.  He had conveyed this symptom onto his PCP, who recommended that the patient hold his Lasix until oral intake improved.   Denies any baseline supplemental oxygen requirements.  Confirms a history of COPD in the context of  being a former smoker, noting 75-pack-year history.  He also has history of chronic diastolic heart failure, most recent echocardiogram March 2023 notable for LVEF 65 to 70% as well as no focal wall motion values, will demonstrate grade 1 diastolic dysfunction and normal right ventricular systolic function.  Medical history also notable for stage IIIb CKD associated baseline creatinine range 1.5-1.9.  Per chart review, baseline hemoglobin range 13-14, with most recent prior value noted to be 13.1 on 10/25/2021    ED Course:  Vital signs in the ED were notable for the following: Temperature max 100.2; heart rate 70-88; initial blood pressure 101/58, which was subsequently increased to 132/81 following interval IV fluids, as below; respiratory rate 16-22, oxygen saturation 88% on room air, with ensuing increase into the range of 96 to 97% on 2 L nasal cannula.  Labs were notable for the following: CMP notable for the following: BUN 66, creatinine 5.54 compared to most recent prior value of 1.90 during the first week of August 2023, calcium, corrected for mild hypoalbuminemia noted to be 9.3, albumin 3.0, otherwise, liver enzymes within normal limits.  Initial lactate 2.4, with repeat value trending down to 1.6.  CBC notable for white blood cell count 8600 with 84% neutrophils, hemoglobin 12.5 associate with normocytic/normochromic findings as well as nonelevated RDW, platelet count 139.  INR 1.1.  Urinalysis ordered, with result currently pending.  COVID-19 PCR positive will influenza PCR negative.  Blood cultures x2 collected prior to initiation of IV antibiotics.  Imaging and additional notable ED work-up: EKG shows sinus rhythm with heart rate 72, normal intervals, and no evidence of T wave or  ST changes, including no evidence of ST elevation.  CT chest showed evidence of right upper lobe infiltrate, concerning for pneumonia, also showing bibasilar atelectasis in the absence of evidence of edema, effusion,  or pneumothorax.  CT abdomen/pelvis showed 4 mm calculi adjacent to the left UVJ, and was not read as being overtly ureteral in nature, and demonstrated no evidence of hydronephrosis.  Additionally, CT abdomen/pelvis showed cholelithiasis in the absence of evidence of acute cholecystitis nor any evidence of choledocholithiasis or dilated common bile duct.  While in the ED, the following were administered: Azithromycin, Rocephin, normal saline x1 L bolus followed by continuous NS at 125 cc/h.  Subsequently, the patient was admitted for suspected community-acquired pneumonia following recent [id number] infection, complicated by acute hypoxic respiratory distress along and mild acute COPD exacerbation with AKI superimposed on CKD 3B presenting with chief complaint of generalized weakness.     Review of Systems: As per HPI otherwise 10 point review of systems negative.   Past Medical History:  Diagnosis Date   Abscess, jaw    Anxiety    Back pain    chronic back pain treated by Dr. Nelva Bush   Chronic kidney disease    Chronic mastoiditis of both sides 11/07/2014   Cigarette smoker 11/07/2014   COPD (chronic obstructive pulmonary disease) ([place])    Gout    Hyperlipidemia    Hypertension    Morbid obesity ([place]) 11/07/2014   Osteomyelitis of mandible 11/07/2014   Thyroid nodule 11/07/2014    Past Surgical History:  Procedure Laterality Date   APPENDECTOMY     TONSILLECTOMY      Social History:  reports that he quit smoking about 7 years ago. His smoking use included cigarettes. He has a 75.00 pack-year smoking history. He has never used smokeless tobacco. He reports that he does not drink alcohol and does not use drugs.   Allergies  Allergen Reactions   Penicillins Rash    Other reaction(s): Not available  Did it involve swelling of the face/tongue/throat, SOB, or low BP? No Did it involve sudden or severe rash/hives, skin peeling, or any reaction on the inside of your mouth or nose?  Yes Did you need to seek medical attention at a hospital or doctor's office? No When did it last happen?    Over 30 years   If all above answers are "NO", may proceed with cephalosporin use.      Family History  Problem Relation Age of Onset   Cancer Mother        esophageal   Heart attack Father 40    Family history reviewed and not pertinent    Prior to Admission medications   Medication Sig Start Date End Date Taking? Authorizing Provider  albuterol (VENTOLIN HFA) 108 (90 Base) MCG/ACT inhaler INHALE 2 PUFFS INTO THE LUNGS EVERY 6 HOURS AS NEEDED FOR WHEEZE OR SHORTNESS OF BREATH Patient taking differently: Inhale 2 puffs into the lungs every 6 (six) hours as needed for wheezing or shortness of breath. INHALE 2 PUFFS INTO THE LUNGS EVERY 6 HOURS AS NEEDED FOR WHEEZE OR SHORTNESS OF BREATH 09/02/21  Yes Hawks, Christy A, FNP  aspirin EC 81 MG tablet Take 81 mg by mouth daily. Swallow whole.   Yes [provider]  atorvastatin (LIPITOR) 20 MG tablet TAKE 1 TABLET BY MOUTH EVERY DAY 08/05/21  Yes Hawks, Christy A, FNP  cholecalciferol (VITAMIN D) 400 UNITS TABS tablet Take 800 Units by mouth daily.   Yes [provider]  citalopram (CELEXA) 20 MG tablet TAKE 1 TABLET BY MOUTH EVERY DAY 09/12/21  Yes Hawks, Christy A, FNP  doxycycline (VIBRA-TABS) 100 MG tablet Take 1 tablet (100 mg total) by mouth 2 (two) times daily. 11/14/21  Yes Hawks, Christy A, FNP  fish oil-omega-3 fatty acids 1000 MG capsule Take 1 g by mouth daily.   Yes [provider]  fluticasone (FLONASE) 50 MCG/ACT nasal spray SPRAY 2 SPRAYS INTO EACH NOSTRIL EVERY DAY 10/22/21  Yes Hawks, Christy A, FNP  furosemide (LASIX) 40 MG tablet TAKE 1 TABLET BY MOUTH EVERY DAY 08/05/21  Yes Hawks, Christy A, FNP  lisinopril (ZESTRIL) 10 MG tablet Take 1 tablet (10 mg total) by mouth daily. 10/25/21  Yes Hawks, Christy A, FNP  oxyCODONE-acetaminophen (PERCOCET) 10-325 MG per tablet Take 1 tablet by mouth every 12  (twelve) hours as needed. Patient taking differently: Take 1 tablet by mouth every 12 (twelve) hours as needed for pain. 10/30/14  Yes Hawks, Christy A, FNP  predniSONE (STERAPRED UNI-PAK 21 TAB) 10 MG (21) TBPK tablet Use as directed Patient taking differently: Take 10-60 mg by mouth as directed. Take 6 tablets on Day 1 Take 5 tablets on Day 2 Take 4 tablets on Day 3 Take 3 tablets on Day 4 Take 2 tablets on Day 5 Take 1 tablet on Day 6 11/14/21  Yes Sharion Balloon, FNP  Specialty Vitamins Products (MAGNESIUM, AMINO ACID CHELATE,) 133 MG tablet Take 1 tablet by mouth daily.   Yes [provider]  Albuterol Sulfate (PROAIR RESPICLICK) 169 (90 Base) MCG/ACT AEPB Inhale 1-2 puffs into the lungs every 6 (six) weeks. prn Patient not taking: Reported on 11/19/2021 06/25/21   Sharion Balloon, FNP  cetirizine (ZYRTEC) 10 MG tablet Take 1 tablet (10 mg total) by mouth daily. Patient not taking: Reported on 11/19/2021 01/28/21   [name] A, FNP  diclofenac sodium (VOLTAREN) 1 % GEL Apply 4 g topically 4 (four) times daily. To left knee for pain Patient not taking: Reported on 11/19/2021 04/14/18   Janora Norlander, DO  verapamil (CALAN-SR) 180 MG CR tablet TAKE 1 TABLET BY MOUTH DAILY Patient not taking: Reported on 11/19/2021 07/08/21   Sharion Balloon, FNP     Objective    Physical Exam: Vitals:   11/20/21 0300 11/20/21 0405 11/20/21 0405 11/20/21 0530  BP: (!) 139/125 106/69  (!) 148/136  Pulse: 84 84  83  Resp: 20 17  (!) 24  Temp:   100.2 F (37.9 C)   TempSrc:   Oral   SpO2: 94% 94%  93%  Weight:      Height:        General: appears to be stated age; alert, oriented Skin: warm, dry, no rash Head:  AT/[place] Mouth:  Oral mucosa membranes appear dry, normal dentition Neck: supple; trachea midline Heart:  RRR; did not appreciate any M/R/G Lungs: CTAB, did not appreciate any wheezes, rales, or rhonchi Abdomen: + BS; soft, ND, NT Vascular: 2+ pedal pulses b/l; 2+ radial  pulses b/l Extremities: no peripheral edema, no muscle wasting Neuro: strength and sensation intact in upper and lower extremities b/l     Labs on Admission: I have personally reviewed following labs and imaging studies  CBC: Recent Labs  Lab 11/19/21 1740 11/20/21 0148  WBC 8.6 6.0  NEUTROABS 7.3 5.1  HGB 12.5* 10.4*  HCT 37.9* 32.8*  MCV 90.9 92.9  PLT 139* 678*   Basic Metabolic Panel: Recent Labs  Lab 11/19/21  1740 11/20/21 0148  NA 135 139  K 4.1 3.5  CL 100 105  CO2 22 20*  GLUCOSE 135* 105*  BUN 66* 62*  CREATININE 5.54* 4.86*  CALCIUM 8.5* 6.7*  MG  --  1.5*  PHOS  --  4.1   GFR: Estimated Creatinine Clearance: 16 mL/min (A) (by C-G formula based on SCr of 4.86 mg/dL (H)). Liver Function Tests: Recent Labs  Lab 11/19/21 1740 11/20/21 0148  AST 22 17  ALT 24 18  ALKPHOS 52 38  BILITOT 0.9 0.7  PROT 6.0* 4.7*  ALBUMIN 3.0* 2.2*   No results for input(s): "LIPASE", "AMYLASE" in the last 168 hours. No results for input(s): "AMMONIA" in the last 168 hours. Coagulation Profile: Recent Labs  Lab 11/19/21 1740  INR 1.1   Cardiac Enzymes: No results for input(s): "CKTOTAL", "CKMB", "CKMBINDEX", "TROPONINI" in the last 168 hours. BNP (last 3 results) No results for input(s): "PROBNP" in the last 8760 hours. HbA1C: No results for input(s): "HGBA1C" in the last 72 hours. CBG: No results for input(s): "GLUCAP" in the last 168 hours. Lipid Profile: No results for input(s): "CHOL", "HDL", "LDLCALC", "TRIG", "CHOLHDL", "LDLDIRECT" in the last 72 hours. Thyroid Function Tests: No results for input(s): "TSH", "T4TOTAL", "FREET4", "T3FREE", "THYROIDAB" in the last 72 hours. Anemia Panel: No results for input(s): "VITAMINB12", "FOLATE", "FERRITIN", "TIBC", "IRON", "RETICCTPCT" in the last 72 hours. Urine analysis:    Component Value Date/Time   COLORURINE YELLOW 11/07/2014 [place] 11/07/2014 0759   LABSPEC 1.020 11/07/2014 0759    PHURINE 6.5 11/07/2014 0759   GLUCOSEU NEGATIVE 11/07/2014 0759   HGBUR SMALL (A) 11/07/2014 0759   BILIRUBINUR small 02/09/2015 [place] 11/07/2014 0759   PROTEINUR trace 02/09/2015 1332   PROTEINUR NEGATIVE 11/07/2014 0759   UROBILINOGEN negative 02/09/2015 1332   UROBILINOGEN 1.0 11/07/2014 0759   NITRITE neg 02/09/2015 1332   NITRITE NEGATIVE 11/07/2014 0759   LEUKOCYTESUR Negative 02/09/2015 1332    Radiological Exams on Admission: CT Chest Wo Contrast  Result Date: 11/19/2021 CLINICAL DATA:  Cough, fever and hematuria. EXAM: CT CHEST, ABDOMEN AND PELVIS WITHOUT CONTRAST TECHNIQUE: Multidetector CT imaging of the chest, abdomen and pelvis was performed following the standard protocol without IV contrast. RADIATION DOSE REDUCTION: This exam was performed according to the departmental dose-optimization program which includes automated exposure control, adjustment of the mA and/or kV according to patient size and/or use of iterative reconstruction technique. COMPARISON:  None Available. FINDINGS: CT CHEST FINDINGS Cardiovascular: There is mild to moderate severity calcification of the aortic arch and descending thoracic aorta, without evidence of aortic aneurysm. Normal heart size with marked severity coronary artery calcification. A very small pericardial effusion is seen. Mediastinum/Nodes: Subcentimeter AP window and pretracheal lymph nodes are noted. Thyroid gland, trachea, and esophagus demonstrate no significant findings. Lungs/Pleura: Mild posterior right upper lobe infiltrate is seen. Mild lingular and posterior bibasilar atelectatic changes are also noted. There is no evidence of a pleural effusion or pneumothorax. Musculoskeletal: Marked severity multilevel degenerative changes seen throughout the thoracic spine. CT ABDOMEN PELVIS FINDINGS Hepatobiliary: No focal liver abnormality is seen. Subcentimeter gallstones are seen within what appears to be in markedly contracted  gallbladder. There is no evidence of biliary dilatation. Pancreas: Unremarkable. No pancreatic ductal dilatation or surrounding inflammatory changes. Spleen: Normal in size without focal abnormality. Adrenals/Urinary Tract: Adrenal glands are unremarkable. Kidneys are normal in size. 2 mm and 3 mm nonobstructing renal calculi are seen within the lower pole  of the left kidney. A 3.7 cm diameter cyst is seen within the anterior aspect of the mid right kidney. A 2.0 cm diameter cyst is noted along the posterior aspect of the mid left kidney. A 5.0 cm x 3.5 cm parapelvic left renal cyst is noted. Adjacent 4 mm calculi are seen within a contracted urinary bladder, along the expected region of the left UVJ (axial CT image 109, CT series 3). Stomach/Bowel: There is a small hiatal hernia. The appendix is surgically absent. No evidence of bowel wall thickening, distention, or inflammatory changes. Vascular/Lymphatic: Aortic atherosclerosis. No enlarged abdominal or pelvic lymph nodes. Reproductive: Prostate is unremarkable. Other: A 4.8 cm x 2.0 cm left inguinal hernia is seen. This contains fluid and fat. No abdominopelvic ascites. Musculoskeletal: Marked severity multilevel degenerative changes seen throughout the lumbar spine. IMPRESSION: 1. Mild posterior right upper lobe infiltrate with mild lingular and posterior bibasilar atelectasis. 2. Adjacent 4 mm calculi along the left UVJ. While these may be renal in origin, small bladder calculi cannot be excluded. 3. Cholelithiasis. 4. Small hiatal hernia. 5. Bilateral renal cysts (Bosniak 2). No additional follow-up imaging is recommended. This recommendation follows ACR consensus guidelines: Management of the Incidental Renal Mass on CT: A White Paper of the ACR Incidental Findings Committee. J Am Coll Radiol [phone]. Aortic Atherosclerosis (ICD10-I70.0). Electronically Signed   By: Virgina Norfolk M.D.   On: 11/19/2021 20:53   CT ABDOMEN PELVIS WO  CONTRAST  Result Date: 11/19/2021 CLINICAL DATA:  Cough, fever and hematuria. EXAM: CT CHEST, ABDOMEN AND PELVIS WITHOUT CONTRAST TECHNIQUE: Multidetector CT imaging of the chest, abdomen and pelvis was performed following the standard protocol without IV contrast. RADIATION DOSE REDUCTION: This exam was performed according to the departmental dose-optimization program which includes automated exposure control, adjustment of the mA and/or kV according to patient size and/or use of iterative reconstruction technique. COMPARISON:  None Available. FINDINGS: CT CHEST FINDINGS Cardiovascular: There is mild to moderate severity calcification of the aortic arch and descending thoracic aorta, without evidence of aortic aneurysm. Normal heart size with marked severity coronary artery calcification. A very small pericardial effusion is seen. Mediastinum/Nodes: Subcentimeter AP window and pretracheal lymph nodes are noted. Thyroid gland, trachea, and esophagus demonstrate no significant findings. Lungs/Pleura: Mild posterior right upper lobe infiltrate is seen. Mild lingular and posterior bibasilar atelectatic changes are also noted. There is no evidence of a pleural effusion or pneumothorax. Musculoskeletal: Marked severity multilevel degenerative changes seen throughout the thoracic spine. CT ABDOMEN PELVIS FINDINGS Hepatobiliary: No focal liver abnormality is seen. Subcentimeter gallstones are seen within what appears to be in markedly contracted gallbladder. There is no evidence of biliary dilatation. Pancreas: Unremarkable. No pancreatic ductal dilatation or surrounding inflammatory changes. Spleen: Normal in size without focal abnormality. Adrenals/Urinary Tract: Adrenal glands are unremarkable. Kidneys are normal in size. 2 mm and 3 mm nonobstructing renal calculi are seen within the lower pole of the left kidney. A 3.7 cm diameter cyst is seen within the anterior aspect of the mid right kidney. A 2.0 cm diameter cyst  is noted along the posterior aspect of the mid left kidney. A 5.0 cm x 3.5 cm parapelvic left renal cyst is noted. Adjacent 4 mm calculi are seen within a contracted urinary bladder, along the expected region of the left UVJ (axial CT image 109, CT series 3). Stomach/Bowel: There is a small hiatal hernia. The appendix is surgically absent. No evidence of bowel wall thickening, distention, or inflammatory changes. Vascular/Lymphatic: Aortic atherosclerosis. No enlarged  abdominal or pelvic lymph nodes. Reproductive: Prostate is unremarkable. Other: A 4.8 cm x 2.0 cm left inguinal hernia is seen. This contains fluid and fat. No abdominopelvic ascites. Musculoskeletal: Marked severity multilevel degenerative changes seen throughout the lumbar spine. IMPRESSION: 1. Mild posterior right upper lobe infiltrate with mild lingular and posterior bibasilar atelectasis. 2. Adjacent 4 mm calculi along the left UVJ. While these may be renal in origin, small bladder calculi cannot be excluded. 3. Cholelithiasis. 4. Small hiatal hernia. 5. Bilateral renal cysts (Bosniak 2). No additional follow-up imaging is recommended. This recommendation follows ACR consensus guidelines: Management of the Incidental Renal Mass on CT: A White Paper of the ACR Incidental Findings Committee. J Am Coll Radiol [phone]. Aortic Atherosclerosis (ICD10-I70.0). Electronically Signed   By: Virgina Norfolk M.D.   On: 11/19/2021 20:53   DG Chest 2 View  Result Date: 11/19/2021 CLINICAL DATA:  Suspected sepsis EXAM: CHEST - 2 VIEW COMPARISON:  Chest 11/18/2021 FINDINGS: Cardiac enlargement without heart failure. Mild right perihilar airspace disease could represent pneumonia. Mild right lower lobe atelectasis with elevated right hemidiaphragm. Mild left lower lobe atelectasis. No effusion. IMPRESSION: Question early right perihilar infiltrate. Right lower lobe atelectasis also present. Electronically Signed   By: Franchot Gallo M.D.   On:  11/19/2021 18:15     EKG: Independently reviewed, with result as described above.    Assessment/Plan   Principal Problem:   CAP (community acquired pneumonia) Active Problems:   Hypertension   Hyperlipidemia   COPD with acute exacerbation ([place])   COVID-19 virus infection   Acute renal failure superimposed on stage 3b chronic kidney disease (HCC)   Acute respiratory failure with hypoxia (HCC)   Generalized weakness   Acute anemia   Chronic diastolic CHF (congestive heart failure) (HCC)   Allergic rhinitis       #) Community acquired pneumonia: Diagnosis on the basis of new onset productive cough, mild shortness of breath, subjective fever, with presenting CT chest showing evidence of right upper lobe infiltrate concerning for pneumonia.  Patient is also noted to be COVID-19 positive.  However, given the duration of his respiratory symptoms dating back to at least the last 2 weeks, followed by interval improvement before developing worsening shortness of breath starting 4 to 5 days ago, I suspect that he had recently developed COVID-19, began to recover from this before ultimately getting bacterial pneumonia.  This sequence and history increases patient's risk for secondary bacterial pneumonia, and will assess for MRSA pneumonia by checking MRSA PCR.  The neutrophilic predominant nature of its CBC is differential also with more suggestive of a bacterial process.  Overall, in the absence of objective fever and leukocytosis, SIRS criteria not met for sepsis at this time.  No evidence of additional bacterial infection, although urinalysis is currently pending.  Blood cultures x2 collected in the ED followed by initiation of IV Rocephin and azithromycin. will continue these antibiotics for now, with close attention to results of MRSA PCR, with consideration for addition of IV vancomycin should this result come back positive.   Plan: Follow for results of blood cultures x2.  Rocephin,  azithromycin.  MRSA PCR.  Add a procalcitonin level.  Repeat CBC with differential in the morning.  Flutter valve, incentive spirometry.  Strep pneumonia urine antigen.         #) COVID-19 infection: Recent COVID-19 infection suspected following positive COVID-19 PCR in the ED today, following associated symptoms starting greater than 2 weeks ago, which included initial rhinitis/rhinorrhea, nonproductive  cough, subjective fever with ensuing initial improvement in several of the symptoms, including rhinitis/rhinorrhea for developing over the last few days a productive cough, worsening shortness of breath.  As described above, this timeframe suggests a recent preceding COVID-19 infection followed by same development of bacterial pneumonia.  Given the timeframe of the symptoms, presentation appears to be outside of the timeframe for initiation of antiviral therapy.  However, will initiate IV corticosteroids in the context of suspected presenting acute COPD exacerbation.  Wise, will engage in supportive measures as it relates to recent COVID-19 infection.   Plan: Prn Tessalon Perles.  Flutter valve.  Incentive spirometry.  Add on serum magnesium and phosphorus levels.  Prn albuterol inhaler.  Further evaluation management of acute COPD exacerbation, as further detailed below.         #) Acute hypoxic respiratory distress: In the context of no baseline supplemental oxygen requirements, mildly hypoxic upon presentation with initial O2 sats in the high 80s, subsequent proving into the mid to high 90s on 2 L nasal cannula.  Appears multifactorial nature, with contributions from suspected community-acquired pneumonia, residual implications of recent [id number] infection, resultant acute COPD exacerbation.  No clinical or radiographic evidence to suggest acutely decompensated heart failure at this time, but will closely monitor for ensuing evidence of volume overload given plan for IV fluids for AKI in the  context of a known history of chronic diastolic heart failure.  CS appears less likely in the absence of any recent typical chest pain, will EKG shows no evidence of acute ischemic changes.  Differential includes acute pulmonary embolism, although this appears to be less likely at the present time.  Plan: Monitor continuous pulse oximetry.  Further evaluation and management community-acquired pneumonia, recent COVID-19 infection, as above.  Scheduled and as needed nebulizer treatments as well as steroids for acute COPD exacerbation, as further detailed below.  Check serum magnesium and phosphorus levels.  Add on BNP.  Monitor on telemetry.  Repeat CMP and CBC in the morning.  Incentive spirometry.  Flutter valve.       #) Acute COPD exacerbation: in the context of a documented history of COPD, acute exacerbation there is suspected, as above.  Patient confirms that he is a former smoker.  Plan: monitor continuous pulse oxymetry. Monitor on telemetry. Solumedrol. Scheduled duonebs q6 hours. Prn albuterol inhaler.  CMP in the morning. Repeat CBC in the morning. Check serum Mg and Phos levels.  Check blood gas. Add-on procalcitonin.          #) Acute Kidney Injury on ckd 3b: Presenting serum creatinine 5.54 relative to baseline.  Range of 1.5-1.9.  Suspect that this is multifactorial in nature, with prerenal contributions from dehydration in the setting of patient's reported recent decline in oral intake as well as clinical appearance of dehydration in addition to contribution from diminished oxygen delivery capacity resulting from presenting acute hypoxic respiratory distress, as above.  Urinalysis with microscopy has been ordered, with result currently pending.  No report of hydronephrosis on today CT abdomen/pelvis.  However, will closely monitor given radiology report of a 4 mm stone adjacent to the UVJ, but without evidence that this is intraureteral.  Additional potential pharmacologic  contributions to the patient's AKI include  lisinopril.  will also exercise caution with home Percocet.   Plan: monitor strict I's & O's and daily weights. Attempt to avoid nephrotoxic agents.  Hold home lisinopril refrain from NSAIDs. Repeat CMP in the morning. Check serum magnesium level.  Follow-up results  of urinalysis with microscopy add-on random urine sodium and random urine creatinine.  Continuous IV fluids, as above.        #) Generalized weakness: 2 weeks of progressive generalized weakness, in the absence of any acute focal weakness.  Likely from multifactorial physiologic stressors stemming from recent COVID-19 infection, community-acquired pneumonia, resolved COPD exacerbation, in addition to acute kidney injury, as above.   Plan: Further evaluation and management of the above infectious processes plus COPD exacerbation and AKI, as detailed above.  Fall precautions ordered.  PT consult in the morning.  Check TSH.  Follow-up result urinalysis.        #) Acute normocytic anemia: Relative to baseline hemoglobin range of 13-14, presenting hemoglobin noted to be 12.5.  While this decrease appears subtle, it is in the context of an overall picture of dehydration in which I would have anticipated hemoglobin to be an elevated relative this on the basis of hemoconcentration.  This may ultimately be on the basis of presenting AKI superimposed on CKD 3B, but will monitor for any evidence of acute blood loss.  Urinalysis currently pending.  On daily baby aspirin at home, otherwise on no blood thinners.  Plan: Repeat CBC in the morning.  Follow-up for urinalysis.  Consider adding on iron studies.  Holding home aspirin for now.         #) Essential Hypertension: documented h/o such, with outpatient antihypertensive regimen including lisinopril.  SBP's in the ED today: Low 100s to 130s mmHg. however, in the setting of potential multiple infectious processes at that time entheses  presentation, We will hold home antihypertensives, including lisinopril, which also be held due to AKI.  Plan: Close monitoring of subsequent BP via routine VS. hold lisinopril for now, as above.            #) Hyperlipidemia: documented h/o such. On atorvastatin as outpatient.    Plan: continue home statin.           #) Allergic Rhinitis: documented h/o such, on scheduled intranasal Flonase as outpatient.    Plan: cont home Flonase.               #) Chronic diastolic heart failure: documented history of such, with most recent echocardiogram performed March 2023 with results as conveyed above. No clinical or radiographic evidence to suggest acutely decompensated heart failure at this time. home diuretic regimen reportedly consists of the following: Lasix 40 mg p.o. daily.  We will closely monitor for ensuing evidence of volume overload given plan for gentle IV fluids for AKI, as above.   Plan: monitor strict I's & O's and daily weights. Repeat CMP in AM. Check serum mag level.  Hold home diuretic for now.  Add on BNP.     DVT prophylaxis: SCD's   Code Status: Full code Family Communication: I discussed patient's case with his wife, who is present at bedside Disposition Plan: Per Rounding Team Consults called: none;  Admission status: Inpatient    PLEASE NOTE THAT DRAGON DICTATION SOFTWARE WAS USED IN THE CONSTRUCTION OF THIS NOTE.   [place] DO [facility]  From [place]   11/20/2021, 7:09 AM

## 2021-11-20 NOTE — ED Notes (Signed)
Pt moved to room 38

## 2021-11-20 NOTE — Progress Notes (Signed)
PROGRESS NOTE   Marcus Carr  INO:[id number] DOB: 07/12/1940 DOA: 11/19/2021 PCP: Sharion Balloon, FNP  Brief Narrative:  67 white male known CKD 3B baseline creatinine 1.7 HTN, HLD, chronic low back pain on opioids follows with Dr. Nelva Bush HFpEF Prior mandibular osteomyelitis, prior tobacco abuse HLD It appears he does have episodic hypotension and syncope and was seen in the office 8/22 and worked up Subsequently he developed a fever and on 8/22 had a virtual visit COVID testing was done which was negative-doxycycline was called in Return to the clinic because of progressive weakness fatigue not eating drinking and was brought in by wheelchair he had intermittent dizziness as well and had cyclical fevers COVID test was negative In the office his O2 sats were noted to be 88% repeat blood pressures 80s over 40s he was advised to go to ED but refused They were unable to get IV access could not give fluids or draw labs-he was prescribed Cipro and Zofran Patient refused transport to ED went home but then presented with increasing confusion--- found to have a new AKI-BUN/creatinine baseline about 1.7 now 67/5.5 on admit COVID-positive CXR cardiac enlargement mild perihilar airspace disease CT chest-posterior right upper lobe infiltrate with lingular basilar atelectasis UVJ stone cholelithiasis bilateral renal cysts no additional follow-up recommended  Hospital-Problem based course  Fever without sepsis secondary to probable aspiration, COVID-positive Unclear what to make of COVID being positive- Dimer 2.76--- scan lower extremities to rule out DVT CRP 16.9 Procalcitonin is elevated indicating potential bacterial cause Stopped PTA doxycycline-continue at this time azithromycin and ceftriaxone, fluids 125 cc/H Would give  Solu-Medrol  Unfortunately renal function precludes use of baricitinib at this time-if improves above GFR 15 can started at 1 mg  AKI on admission Patient prior to  admission on diclofenac, lisinopril 10, Lasix 40 which have all been held Would bladder scan to see if we can rule out obstructive pathology  HFpEF On discharge resume atorvastatin, aspirin 81  HTN As hypotensive holding verapamil 180 daily and other meds as described above  Former tobacco?  COPD Reasonable to continue Solu-Medrol today resume albuterol puffs every 6 as needed for wheeze Cessation counseling  Depression Hold Celexa 20 at this time given AKI and resume cautiously closer to discharge  Chronic low back pain Given severe AKI would replace Percocet with low-dose Dilaudid 1 mg every 6 as needed only and would not titrate until he is more clear  DVT prophylaxis: SCD Code Status: Full Family Communication:  Disposition:  Status is: Inpatient Remains inpatient appropriate because:   Is sick and requires inpatient hospitalization   Consultants:  None at this time  Procedures: No  Antimicrobials: Ceftriaxone azithromycin   Subjective: Sleeping deeply when seen in the ED but arouses appropriately-note that O2 sat was in the mid 80s while asleep but rebounds quickly to 90s He is coherent Wife relates that she had tried to get him to come to the hospital several times before he relented  Objective: Vitals:   11/20/21 0405 11/20/21 0405 11/20/21 0530 11/20/21 0700  BP: 106/69  (!) 148/136 104/73  Pulse: 84  83 88  Resp: 17  (!) 24 (!) 25  Temp:  100.2 F (37.9 C)    TempSrc:  Oral    SpO2: 94%  93% 91%  Weight:      Height:        Intake/Output Summary (Last 24 hours) at 11/20/2021 0910 Last data filed at 11/20/2021 0745 Gross per 24 hour  Intake 2235.78 ml  Output --  Net 2235.78 ml   Filed Weights   11/19/21 1719  Weight: 127.5 kg    Examination:  Thick neck Mallampati 4 no icterus no pallor---on oxygen at this time CTA B no added sound rales rhonchi or wheeze S1-S2 no murmur no rub no gallop Abdomen soft no rebound no guarding no rales no  rhonchi  no focal deficit ROM intact Skin soft supple  Data Reviewed: personally reviewed   CBC    Component Value Date/Time   WBC 6.0 11/20/2021 0148   RBC 3.53 (L) 11/20/2021 0148   HGB 10.5 (L) 11/20/2021 0823   HGB 13.1 10/25/2021 1609   HCT 31.0 (L) 11/20/2021 0823   HCT 38.6 10/25/2021 1609   PLT 103 (L) 11/20/2021 0148   PLT 129 (L) 10/25/2021 1609   MCV 92.9 11/20/2021 0148   MCV 88 10/25/2021 1609   MCH 29.5 11/20/2021 0148   MCHC 31.7 11/20/2021 0148   RDW 14.3 11/20/2021 0148   RDW 14.3 10/25/2021 1609   LYMPHSABS 0.4 (L) 11/20/2021 0148   LYMPHSABS 1.4 10/25/2021 1609   MONOABS 0.4 11/20/2021 0148   EOSABS 0.1 11/20/2021 0148   EOSABS 0.2 10/25/2021 1609   BASOSABS 0.0 11/20/2021 0148   BASOSABS 0.0 10/25/2021 1609      Latest Ref Rng & Units 11/20/2021    8:23 AM 11/20/2021    1:48 AM 11/19/2021    5:40 PM  CMP  Glucose 70 - 99 mg/dL  105  135   BUN 8 - 23 mg/dL  62  66   Creatinine 0.61 - 1.24 mg/dL  4.86  5.54   Sodium 135 - 145 mmol/L 137  139  135   Potassium 3.5 - 5.1 mmol/L 4.2  3.5  4.1   Chloride 98 - 111 mmol/L  105  100   CO2 22 - 32 mmol/L  20  22   Calcium 8.9 - 10.3 mg/dL  6.7  8.5   Total Protein 6.5 - 8.1 g/dL  4.7  6.0   Total Bilirubin 0.3 - 1.2 mg/dL  0.7  0.9   Alkaline Phos 38 - 126 U/L  38  52   AST 15 - 41 U/L  17  22   ALT 0 - 44 U/L  18  24      Radiology Studies: CT Chest Wo Contrast  Result Date: 11/19/2021 CLINICAL DATA:  Cough, fever and hematuria. EXAM: CT CHEST, ABDOMEN AND PELVIS WITHOUT CONTRAST TECHNIQUE: Multidetector CT imaging of the chest, abdomen and pelvis was performed following the standard protocol without IV contrast. RADIATION DOSE REDUCTION: This exam was performed according to the departmental dose-optimization program which includes automated exposure control, adjustment of the mA and/or kV according to patient size and/or use of iterative reconstruction technique. COMPARISON:  None Available. FINDINGS:  CT CHEST FINDINGS Cardiovascular: There is mild to moderate severity calcification of the aortic arch and descending thoracic aorta, without evidence of aortic aneurysm. Normal heart size with marked severity coronary artery calcification. A very small pericardial effusion is seen. Mediastinum/Nodes: Subcentimeter AP window and pretracheal lymph nodes are noted. Thyroid gland, trachea, and esophagus demonstrate no significant findings. Lungs/Pleura: Mild posterior right upper lobe infiltrate is seen. Mild lingular and posterior bibasilar atelectatic changes are also noted. There is no evidence of a pleural effusion or pneumothorax. Musculoskeletal: Marked severity multilevel degenerative changes seen throughout the thoracic spine. CT ABDOMEN PELVIS FINDINGS Hepatobiliary: No focal liver abnormality is seen. Subcentimeter gallstones are  seen within what appears to be in markedly contracted gallbladder. There is no evidence of biliary dilatation. Pancreas: Unremarkable. No pancreatic ductal dilatation or surrounding inflammatory changes. Spleen: Normal in size without focal abnormality. Adrenals/Urinary Tract: Adrenal glands are unremarkable. Kidneys are normal in size. 2 mm and 3 mm nonobstructing renal calculi are seen within the lower pole of the left kidney. A 3.7 cm diameter cyst is seen within the anterior aspect of the mid right kidney. A 2.0 cm diameter cyst is noted along the posterior aspect of the mid left kidney. A 5.0 cm x 3.5 cm parapelvic left renal cyst is noted. Adjacent 4 mm calculi are seen within a contracted urinary bladder, along the expected region of the left UVJ (axial CT image 109, CT series 3). Stomach/Bowel: There is a small hiatal hernia. The appendix is surgically absent. No evidence of bowel wall thickening, distention, or inflammatory changes. Vascular/Lymphatic: Aortic atherosclerosis. No enlarged abdominal or pelvic lymph nodes. Reproductive: Prostate is unremarkable. Other: A 4.8 cm  x 2.0 cm left inguinal hernia is seen. This contains fluid and fat. No abdominopelvic ascites. Musculoskeletal: Marked severity multilevel degenerative changes seen throughout the lumbar spine. IMPRESSION: 1. Mild posterior right upper lobe infiltrate with mild lingular and posterior bibasilar atelectasis. 2. Adjacent 4 mm calculi along the left UVJ. While these may be renal in origin, small bladder calculi cannot be excluded. 3. Cholelithiasis. 4. Small hiatal hernia. 5. Bilateral renal cysts (Bosniak 2). No additional follow-up imaging is recommended. This recommendation follows ACR consensus guidelines: Management of the Incidental Renal Mass on CT: A White Paper of the ACR Incidental Findings Committee. J Am Coll Radiol [phone]. Aortic Atherosclerosis (ICD10-I70.0). Electronically Signed   By: Virgina Norfolk M.D.   On: 11/19/2021 20:53   CT ABDOMEN PELVIS WO CONTRAST  Result Date: 11/19/2021 CLINICAL DATA:  Cough, fever and hematuria. EXAM: CT CHEST, ABDOMEN AND PELVIS WITHOUT CONTRAST TECHNIQUE: Multidetector CT imaging of the chest, abdomen and pelvis was performed following the standard protocol without IV contrast. RADIATION DOSE REDUCTION: This exam was performed according to the departmental dose-optimization program which includes automated exposure control, adjustment of the mA and/or kV according to patient size and/or use of iterative reconstruction technique. COMPARISON:  None Available. FINDINGS: CT CHEST FINDINGS Cardiovascular: There is mild to moderate severity calcification of the aortic arch and descending thoracic aorta, without evidence of aortic aneurysm. Normal heart size with marked severity coronary artery calcification. A very small pericardial effusion is seen. Mediastinum/Nodes: Subcentimeter AP window and pretracheal lymph nodes are noted. Thyroid gland, trachea, and esophagus demonstrate no significant findings. Lungs/Pleura: Mild posterior right upper lobe infiltrate  is seen. Mild lingular and posterior bibasilar atelectatic changes are also noted. There is no evidence of a pleural effusion or pneumothorax. Musculoskeletal: Marked severity multilevel degenerative changes seen throughout the thoracic spine. CT ABDOMEN PELVIS FINDINGS Hepatobiliary: No focal liver abnormality is seen. Subcentimeter gallstones are seen within what appears to be in markedly contracted gallbladder. There is no evidence of biliary dilatation. Pancreas: Unremarkable. No pancreatic ductal dilatation or surrounding inflammatory changes. Spleen: Normal in size without focal abnormality. Adrenals/Urinary Tract: Adrenal glands are unremarkable. Kidneys are normal in size. 2 mm and 3 mm nonobstructing renal calculi are seen within the lower pole of the left kidney. A 3.7 cm diameter cyst is seen within the anterior aspect of the mid right kidney. A 2.0 cm diameter cyst is noted along the posterior aspect of the mid left kidney. A 5.0 cm x 3.5 cm  parapelvic left renal cyst is noted. Adjacent 4 mm calculi are seen within a contracted urinary bladder, along the expected region of the left UVJ (axial CT image 109, CT series 3). Stomach/Bowel: There is a small hiatal hernia. The appendix is surgically absent. No evidence of bowel wall thickening, distention, or inflammatory changes. Vascular/Lymphatic: Aortic atherosclerosis. No enlarged abdominal or pelvic lymph nodes. Reproductive: Prostate is unremarkable. Other: A 4.8 cm x 2.0 cm left inguinal hernia is seen. This contains fluid and fat. No abdominopelvic ascites. Musculoskeletal: Marked severity multilevel degenerative changes seen throughout the lumbar spine. IMPRESSION: 1. Mild posterior right upper lobe infiltrate with mild lingular and posterior bibasilar atelectasis. 2. Adjacent 4 mm calculi along the left UVJ. While these may be renal in origin, small bladder calculi cannot be excluded. 3. Cholelithiasis. 4. Small hiatal hernia. 5. Bilateral renal  cysts (Bosniak 2). No additional follow-up imaging is recommended. This recommendation follows ACR consensus guidelines: Management of the Incidental Renal Mass on CT: A White Paper of the ACR Incidental Findings Committee. J Am Coll Radiol [phone]. Aortic Atherosclerosis (ICD10-I70.0). Electronically Signed   By: Virgina Norfolk M.D.   On: 11/19/2021 20:53   DG Chest 2 View  Result Date: 11/19/2021 CLINICAL DATA:  Suspected sepsis EXAM: CHEST - 2 VIEW COMPARISON:  Chest 11/18/2021 FINDINGS: Cardiac enlargement without heart failure. Mild right perihilar airspace disease could represent pneumonia. Mild right lower lobe atelectasis with elevated right hemidiaphragm. Mild left lower lobe atelectasis. No effusion. IMPRESSION: Question early right perihilar infiltrate. Right lower lobe atelectasis also present. Electronically Signed   By: Franchot Gallo M.D.   On: 11/19/2021 18:15     Scheduled Meds:  atorvastatin  20 mg Oral Daily   fluticasone  1 spray Each Nare Daily   ipratropium-albuterol  3 mL Nebulization Q6H   methylPREDNISolone (SOLU-MEDROL) injection  80 mg Intravenous BID   Continuous Infusions:  sodium chloride 125 mL/hr at 11/19/21 2349   azithromycin     cefTRIAXone (ROCEPHIN)  IV       LOS: 1 day   Time spent: [place], MD [facility] To contact the attending provider between 7A-7P or the covering provider during after hours 7P-7A, please log into the web site [email or web address] and access using universal [place] password for that web site. If you do not have the password, please call the hospital operator.  11/20/2021, 9:10 AM

## 2021-11-21 ENCOUNTER — Inpatient Hospital Stay (HOSPITAL_COMMUNITY): Payer: Medicare Other

## 2021-11-21 ENCOUNTER — Encounter (HOSPITAL_COMMUNITY): Payer: Medicare Other

## 2021-11-21 DIAGNOSIS — J189 Pneumonia, unspecified organism: Secondary | ICD-10-CM | POA: Diagnosis not present

## 2021-11-21 LAB — COMPREHENSIVE METABOLIC PANEL
ALT: 19 U/L (ref 0–44)
AST: 15 U/L (ref 15–41)
Albumin: 2.5 g/dL — ABNORMAL LOW (ref 3.5–5.0)
Alkaline Phosphatase: 52 U/L (ref 38–126)
Anion gap: 10 (ref 5–15)
BUN: 77 mg/dL — ABNORMAL HIGH (ref 8–23)
CO2: 21 mmol/L — ABNORMAL LOW (ref 22–32)
Calcium: 8.2 mg/dL — ABNORMAL LOW (ref 8.9–10.3)
Chloride: 106 mmol/L (ref 98–111)
Creatinine, Ser: 5.61 mg/dL — ABNORMAL HIGH (ref 0.61–1.24)
GFR, Estimated: 10 mL/min — ABNORMAL LOW (ref >60)
Glucose, Bld: 207 mg/dL — ABNORMAL HIGH (ref 70–99)
Potassium: 4.5 mmol/L (ref 3.5–5.1)
Sodium: 137 mmol/L (ref 135–145)
Total Bilirubin: 0.5 mg/dL (ref 0.3–1.2)
Total Protein: 5.7 g/dL — ABNORMAL LOW (ref 6.5–8.1)

## 2021-11-21 LAB — CBC
HCT: 34.6 % — ABNORMAL LOW (ref 39.0–52.0)
Hemoglobin: 11.4 g/dL — ABNORMAL LOW (ref 13.0–17.0)
MCH: 29.4 pg (ref 26.0–34.0)
MCHC: 32.9 g/dL (ref 30.0–36.0)
MCV: 89.2 fL (ref 80.0–100.0)
Platelets: 122 10*3/uL — ABNORMAL LOW (ref 150–400)
RBC: 3.88 MIL/uL — ABNORMAL LOW (ref 4.22–5.81)
RDW: 14.2 % (ref 11.5–15.5)
WBC: 3.5 10*3/uL — ABNORMAL LOW (ref 4.0–10.5)
nRBC: 0.6 % — ABNORMAL HIGH (ref 0.0–0.2)

## 2021-11-21 LAB — D-DIMER, QUANTITATIVE: D-Dimer, Quant: 3.56 ug/mL-FEU — ABNORMAL HIGH (ref 0.00–0.50)

## 2021-11-21 LAB — C-REACTIVE PROTEIN: CRP: 17.3 mg/dL — ABNORMAL HIGH (ref <1.0)

## 2021-11-21 MED ORDER — TOCILIZUMAB 400 MG/20ML IV SOLN
800.0000 mg | Freq: Once | INTRAVENOUS | Status: AC
  Administered 2021-11-21: 800 mg via INTRAVENOUS
  Filled 2021-11-21: qty 40

## 2021-11-21 NOTE — Progress Notes (Signed)
Occupational Therapy Treatment Patient Details Name: Marcus Carr MRN: [id number] DOB: 22-Apr-1940 Today's Date: 11/21/2021   History of present illness Pt is a 81 year old man admitted on 8/29 with CAP, COVID positive. PMH: COPDm CHF, CKD, HLD, low back pain, mandibular osteomyelitis, prior tobacco use, depression.   OT comments  Pt has had a gradual decline in function x several weeks per wife, but has been resistant to being admitted for work up. Pt presents with generalized weakness, decreased activity tolerance and impaired standing balance. Pt avoids socks and wears slip on shoes because he has difficulty accessing his feet due to chronic back pain. He typically stands to shower. He endorses getting injections in his knees on an outpatient basis. Pt presents with generalized weakness, impaired standing balance and decreased activity tolerance. He requires min guard assist for OOB with RW and set up to mod assist for ADLs. Will follow acutely to address LB ADLs with AE, energy conservation strategies and educate in available DME for safe showering.    Recommendations for follow up therapy are one component of a multi-disciplinary discharge planning process, led by the attending physician.  Recommendations may be updated based on patient status, additional functional criteria and insurance authorization.    Follow Up Recommendations  Home health OT    Assistance Recommended at Discharge Intermittent Supervision/Assistance  Patient can return home with the following  A little help with walking and/or transfers;A lot of help with bathing/dressing/bathroom;Assistance with cooking/housework;Direct supervision/assist for medications management;Direct supervision/assist for financial management;Assist for transportation;Help with stairs or ramp for entrance   Equipment Recommendations  Tub/shower bench    Recommendations for Other Services      Precautions / Restrictions  Precautions Precautions: Fall Restrictions Weight Bearing Restrictions: No       Mobility Bed Mobility Overal bed mobility: Needs Assistance Bed Mobility: Supine to Sit     Supine to sit: Min guard, HOB elevated     General bed mobility comments: cues for seqeuncing, increased time and effort with tremors noted    Transfers Overall transfer level: Needs assistance Equipment used: Rolling walker (2 wheels) Transfers: Sit to/from Stand Sit to Stand: Min guard           General transfer comment: for safety, pt using support of bedrails     Balance Overall balance assessment: Needs assistance   Sitting balance-Leahy Scale: Good     Standing balance support: Bilateral upper extremity supported, During functional activity Standing balance-Leahy Scale: Poor Standing balance comment: reliant on RW                           ADL either performed or assessed with clinical judgement   ADL Overall ADL's : Needs assistance/impaired Eating/Feeding: Independent   Grooming: Min guard;Standing   Upper Body Bathing: Minimal assistance;Sitting   Lower Body Bathing: Minimal assistance;Sit to/from stand   Upper Body Dressing : Set up;Sitting   Lower Body Dressing: Maximal assistance;Sitting/lateral leans Lower Body Dressing Details (indicate cue type and reason): for socks Toilet Transfer: Min guard;Transfer board;Rolling walker (2 wheels)   Toileting- Clothing Manipulation and Hygiene: Minimal assistance;Sit to/from stand       Functional mobility during ADLs: Min guard;Rolling walker (2 wheels)      Extremity/Trunk Assessment Upper Extremity Assessment Upper Extremity Assessment: Generalized weakness   Lower Extremity Assessment Lower Extremity Assessment: Defer to PT evaluation   Cervical / Trunk Assessment Cervical / Trunk Assessment: Other exceptions Cervical /  Trunk Exceptions: obesity, hx of back pain    Vision Baseline Vision/History: 1 Wears  glasses Ability to See in Adequate Light: 0 Adequate Patient Visual Report: No change from baseline     Perception     Praxis      Cognition Arousal/Alertness: Awake/alert Behavior During Therapy: WFL for tasks assessed/performed Overall Cognitive Status: Within Functional Limits for tasks assessed                                 General Comments: somewhat slow processing        Exercises      Shoulder Instructions       General Comments on 3LO2 upon arrival to room, SPO2 at rest on 3LO2 95% so placed on 2LO2 with maintenance of 95% SpO2    Pertinent Vitals/ Pain       Pain Assessment Pain Assessment: Faces Faces Pain Scale: Hurts little more Pain Location: bilat knees, chronic in nature Pain Descriptors / Indicators: Sore, Discomfort Pain Intervention(s): Monitored during session, Repositioned  Home Living Family/patient expects to be discharged to:: Private residence Living Arrangements: Spouse/significant other;Other relatives (74 year old grandson) Available Help at Discharge: Family;Available 24 hours/day Type of Home: Mobile home Home Access: Stairs to enter Entrance Stairs-Number of Steps: 2 Entrance Stairs-Rails: Right;Left Home Layout: One level     Bathroom Shower/Tub: Teacher, early years/pre: Standard     Home Equipment: Cane - single point;Rollator (4 wheels);Wheelchair - manual;Grab bars - tub/shower          Prior Functioning/Environment              Frequency  Min 2X/week        Progress Toward Goals  OT Goals(current goals can now be found in the care plan section)     Acute Rehab OT Goals OT Goal Formulation: With patient Time For Goal Achievement: 12/05/21 Potential to Achieve Goals: Good ADL Goals Pt Will Perform Grooming: standing;with supervision Pt Will Perform Lower Body Bathing: with supervision;sit to/from stand;with adaptive equipment Pt Will Perform Lower Body Dressing: with  supervision;with adaptive equipment;sit to/from stand Pt Will Transfer to Toilet: with supervision;ambulating;regular height toilet Pt Will Perform Toileting - Clothing Manipulation and hygiene: with supervision;sit to/from stand Pt Will Perform Tub/Shower Transfer: Tub transfer;with supervision;ambulating;rolling walker;tub bench Additional ADL Goal #1: Pt will employ energy conservation strategies in ADLs and mobility as instructed.  Plan      Co-evaluation      Reason for Co-Treatment: Other (comment) (tolerance) PT goals addressed during session: Mobility/safety with mobility;Balance;Proper use of DME        AM-PAC OT "6 Clicks" Daily Activity     Outcome Measure   Help from another person eating meals?: None Help from another person taking care of personal grooming?: A Little Help from another person toileting, which includes using toliet, bedpan, or urinal?: A Little Help from another person bathing (including washing, rinsing, drying)?: A Little Help from another person to put on and taking off regular upper body clothing?: A Little Help from another person to put on and taking off regular lower body clothing?: A Lot 6 Click Score: 18    End of Session Equipment Utilized During Treatment: Gait belt;Rolling walker (2 wheels);Oxygen (3L)  OT Visit Diagnosis: Unsteadiness on feet (R26.81);Other abnormalities of gait and mobility (R26.89);Muscle weakness (generalized) (M62.81);Pain   Activity Tolerance Patient tolerated treatment well   Patient Left in chair;with  call bell/phone within reach;with chair alarm set;with family/visitor present   Nurse Communication          Time: [phone] OT Time Calculation (min): 22 min  Charges: OT General Charges $OT Visit: 1 Visit OT Evaluation $OT Eval Moderate Complexity: [place], OTR/L Acute Rehabilitation Services Office: [phone]   Malka So 11/21/2021, 11:25 AM

## 2021-11-21 NOTE — Progress Notes (Signed)
Wife educated multiple times re: isolation precautions; with minimal understanding. Requesting to go in and out of room without gown and mask. Discussed multiple times.

## 2021-11-21 NOTE — Progress Notes (Signed)
Attempted x2 patient in chair. Will try again when patient is in bed.   Marcus Carr 11/21/2021 10:54 AM

## 2021-11-21 NOTE — Evaluation (Signed)
Physical Therapy Evaluation Patient Details Name: Marcus Carr MRN: [id number] DOB: Oct 30, 1940 Today's Date: 11/21/2021  History of Present Illness  Pt is a 81 year old man admitted on 8/29 with CAP, COVID positive. PMH: COPDm CHF, CKD, HLD, low back pain, mandibular osteomyelitis, prior tobacco use, depression.  Clinical Impression   Pt presents with generalized weakness, impaired activity tolerance, and impaired balance vs baseline. Pt to benefit from acute PT to address deficits. Pt ambulated room distance only and overall requiring close guard for safety and cues for form. Pt reports gradual decline over the past few weeks from baseline level of mobility, PT recommending HHPT post-acutely. PT to progress mobility as tolerated, and will continue to follow acutely.         Recommendations for follow up therapy are one component of a multi-disciplinary discharge planning process, led by the attending physician.  Recommendations may be updated based on patient status, additional functional criteria and insurance authorization.  Follow Up Recommendations Home health PT      Assistance Recommended at Discharge Intermittent Supervision/Assistance  Patient can return home with the following  A little help with walking and/or transfers;A little help with bathing/dressing/bathroom    Equipment Recommendations None recommended by PT  Recommendations for Other Services       Functional Status Assessment Patient has had a recent decline in their functional status and demonstrates the ability to make significant improvements in function in a reasonable and predictable amount of time.     Precautions / Restrictions Precautions Precautions: Fall Restrictions Weight Bearing Restrictions: No      Mobility  Bed Mobility Overal bed mobility: Needs Assistance Bed Mobility: Supine to Sit     Supine to sit: Min guard, HOB elevated     General bed mobility comments: cues for  seqeuncing, increased time and effort with tremors noted    Transfers Overall transfer level: Needs assistance Equipment used: None Transfers: Sit to/from Stand Sit to Stand: Min guard           General transfer comment: for safety, pt using SL support of bedrails    Ambulation/Gait Ambulation/Gait assistance: Min guard, +2 safety/equipment Gait Distance (Feet): 15 Feet Assistive device: Rolling walker (2 wheels) Gait Pattern/deviations: Step-through pattern, Decreased stride length Gait velocity: decr     General Gait Details: cues for use of RW and room navigation , close guard for safety  Stairs            Wheelchair Mobility    Modified Rankin (Stroke Patients Only)       Balance Overall balance assessment: Needs assistance Sitting-balance support: No upper extremity supported, Feet supported Sitting balance-Leahy Scale: Good     Standing balance support: Bilateral upper extremity supported, During functional activity Standing balance-Leahy Scale: Poor Standing balance comment: reliant on external support                             Pertinent Vitals/Pain Pain Assessment Pain Assessment: Faces Faces Pain Scale: Hurts little more Pain Location: bilat knees, chronic in nature Pain Descriptors / Indicators: Sore, Discomfort Pain Intervention(s): Limited activity within patient's tolerance, Monitored during session, Repositioned    Home Living Family/patient expects to be discharged to:: Private residence Living Arrangements: Spouse/significant other;Other relatives (41 yo grandchild) Available Help at Discharge: Family Type of Home: Mobile home Home Access: Stairs to enter Entrance Stairs-Rails: Right;Left Entrance Stairs-Number of Steps: 2   Home Layout: One level Home Equipment:  Cane - single point;Rollator (4 wheels);Wheelchair - manual;Grab bars - tub/shower      Prior Function Prior Level of Function : Independent/Modified  Independent             Mobility Comments: pt does not walk with AD ADLs Comments: PTA, pt doing dishes, mowing lawn     Hand Dominance   Dominant Hand: Right    Extremity/Trunk Assessment   Upper Extremity Assessment Upper Extremity Assessment: Defer to OT evaluation    Lower Extremity Assessment Lower Extremity Assessment: Generalized weakness    Cervical / Trunk Assessment Cervical / Trunk Assessment: Normal  Communication   Communication: HOH  Cognition Arousal/Alertness: Awake/alert Behavior During Therapy: WFL for tasks assessed/performed Overall Cognitive Status: Within Functional Limits for tasks assessed                                          General Comments General comments (skin integrity, edema, etc.): on 3LO2 upon arrival to room, SPO2 at rest on 3LO2 95% so placed on 2LO2 with maintenance of 95% SpO2    Exercises General Exercises - Lower Extremity Long Arc Quad: AROM, Both, 5 reps, Seated   Assessment/Plan    PT Assessment Patient needs continued PT services  PT Problem List Decreased strength;Decreased mobility;Decreased safety awareness;Decreased activity tolerance;Decreased balance;Decreased knowledge of use of DME;Pain;Cardiopulmonary status limiting activity       PT Treatment Interventions DME instruction;Therapeutic activities;Gait training;Therapeutic exercise;Patient/family education;Balance training;Stair training;Functional mobility training;Neuromuscular re-education    PT Goals (Current goals can be found in the Care Plan section)  Acute Rehab PT Goals Patient Stated Goal: home PT Goal Formulation: With patient Time For Goal Achievement: 12/05/21 Potential to Achieve Goals: Good    Frequency Min 3X/week     Co-evaluation PT/OT/SLP Co-Evaluation/Treatment: Yes Reason for Co-Treatment: Other (comment) (tolerance) PT goals addressed during session: Mobility/safety with mobility;Balance;Proper use of DME          AM-PAC PT "6 Clicks" Mobility  Outcome Measure Help needed turning from your back to your side while in a flat bed without using bedrails?: A Little Help needed moving from lying on your back to sitting on the side of a flat bed without using bedrails?: A Little Help needed moving to and from a bed to a chair (including a wheelchair)?: A Little Help needed standing up from a chair using your arms (e.g., wheelchair or bedside chair)?: A Little Help needed to walk in hospital room?: A Little Help needed climbing 3-5 steps with a railing? : A Lot 6 Click Score: 17    End of Session Equipment Utilized During Treatment: Oxygen Activity Tolerance: Patient tolerated treatment well;Patient limited by fatigue Patient left: in chair;with call bell/phone within reach;with family/visitor present Nurse Communication: Mobility status PT Visit Diagnosis: Other abnormalities of gait and mobility (R26.89);Muscle weakness (generalized) (M62.81)    Time: 6712-4580 PT Time Calculation (min) (ACUTE ONLY): 20 min   Charges:   PT Evaluation $PT Eval Low Complexity: 1 Low         Filomena Pokorney S, PT DPT Acute Rehabilitation Services Pager [phone]  Office [phone]  Niangua E Ruffin Pyo 11/21/2021, 10:46 AM

## 2021-11-21 NOTE — Progress Notes (Signed)
  Transition of Care [facility]) Screening Note   Patient Details  Name: Lamarius Dirr Date of Birth: July 14, 1940   Transition of Care [facility]) CM/SW Contact:    Cyndi Bender, RN Phone Number: 11/21/2021, 9:09 AM    Transition of Care Department [facility]) has reviewed patient and no TOC needs have been identified at this time. We will continue to monitor patient advancement through interdisciplinary progression rounds. If new patient transition needs arise, please place a TOC consult.

## 2021-11-21 NOTE — Progress Notes (Signed)
PROGRESS NOTE   Marcus Carr  OVF:[id number] DOB: [birth year] DOA: 11/19/2021 PCP: Marcus Balloon, FNP  Brief Narrative:  65 white male known CKD 3B baseline creatinine 1.7 HTN, HLD, chronic low back pain on opioids follows with Dr. Nelva Carr HFpEF Prior mandibular osteomyelitis, prior tobacco abuse HLD It appears he does have episodic hypotension and syncope and was seen in the office 8/22 and worked up Subsequently he developed a fever and on 8/22 had a virtual visit COVID testing was done which was negative-doxycycline was called in Return to the clinic because of progressive weakness fatigue not eating drinking and was brought in by wheelchair he had intermittent dizziness as well and had cyclical fevers COVID test was negative In the office his O2 sats were noted to be 88% repeat blood pressures 80s over 40s he was advised to go to ED but refused They were unable to get IV access could not give fluids or draw labs-he was prescribed Cipro and Zofran Patient refused transport to ED went home but then presented with increasing confusion--- found to have a new AKI-BUN/creatinine baseline about 1.7 now 66/5.5 on admit COVID-positive CXR cardiac enlargement mild perihilar airspace disease CT chest-posterior right upper lobe infiltrate with lingular basilar atelectasis UVJ stone cholelithiasis bilateral renal cysts no additional follow-up recommended  Hospital-Problem based course  Fever without sepsis secondary to probable aspiration, COVID-positive Unclear what to make of COVID being positive- Dimer up to 3.56--lower extremity duplex pending-if negative needs CT chest CRP up to 17.3 Procalcitonin is elevated indicating potential bacterial cause Stopped PTA doxycycline-continue at this time azithromycin and ceftriaxone, fluids 125 cc/H Continuing Solu-Medrol  risk benefits and alternatives discussion undertaken with regard to Actemra and deciding to administer today  AKI on  admission FeNA indicating prerenal-DDx severe volume depletion/hypotension in the setting of diuretics Patient prior to admission on diclofenac, lisinopril 10, Lasix 40 which have all been held Bladder scan is pending  Ultrasound abdomen pelvis ordered We will place Foley and recheck labs in a.m.-if still worse will need nephrology input and autoimmune studies   HFpEF On discharge resume atorvastatin, aspirin 81  HTN As hypotensive holding verapamil 180 daily and other meds as described above  Former tobacco?  COPD Reasonable to continue Solu-Medrol today resume albuterol puffs every 6 as needed for wheeze Cessation counseling  Depression Hold Celexa 20 at this time given AKI and resume cautiously closer to discharge  Chronic low back pain Given severe AKI would replace Percocet with low-dose Dilaudid 1 mg every 6 as needed only and would not titrate until he is more clear  DVT prophylaxis: SCD Code Status: Full Family Communication: Discussed with the patient's wife Marcus Carr at the bedside in detail explained to her clearly planning Disposition:  Status is: Inpatient Remains inpatient appropriate because:   Is sick and requires inpatient hospitalization Still has AKI giving Actemra today   Consultants:  None at this time  Procedures: No  Antimicrobials: Ceftriaxone azithromycin   Subjective: Much more awake coherent Some cough  Passing good urine-tells me he occasionally has obstructive symptoms and poor flow Overall feels a little stronger Oxygen sat is about 95%   Objective: Vitals:   11/21/21 0303 11/21/21 0356 11/21/21 0737 11/21/21 0832  BP: (!) 165/103  (!) 162/101   Pulse: 71  74 72  Resp: '15  18 16  '$ Temp:   98 F (36.7 C)   TempSrc:   Oral   SpO2: 96% 94% 95% 96%  Weight:  Height:        Intake/Output Summary (Last 24 hours) at 11/21/2021 0904 Last data filed at 11/20/2021 1938 Gross per 24 hour  Intake 2131.11 ml  Output 400 ml  Net  1731.11 ml    Filed Weights   11/19/21 1719  Weight: 127.5 kg    Examination:  Thick neck Mallampati 4 no icterus no pallor---on oxygen at this time CTA B with wheeze on the right posterior lateral side S1-S2 no murmur no rub no gallop Abdomen soft no rebound no guarding no rales no rhonchi  no focal deficit ROM intact Skin soft supple  Data Reviewed: personally reviewed   CBC    Component Value Date/Time   WBC 3.5 (L) 11/21/2021 0434   RBC 3.88 (L) 11/21/2021 0434   HGB 11.4 (L) 11/21/2021 0434   HGB 13.1 10/25/2021 1609   HCT 34.6 (L) 11/21/2021 0434   HCT 38.6 10/25/2021 1609   PLT 122 (L) 11/21/2021 0434   PLT 129 (L) 10/25/2021 1609   MCV 89.2 11/21/2021 0434   MCV 88 10/25/2021 1609   MCH 29.4 11/21/2021 0434   MCHC 32.9 11/21/2021 0434   RDW 14.2 11/21/2021 0434   RDW 14.3 10/25/2021 1609   LYMPHSABS 0.4 (L) 11/20/2021 0148   LYMPHSABS 1.4 10/25/2021 1609   MONOABS 0.4 11/20/2021 0148   EOSABS 0.1 11/20/2021 0148   EOSABS 0.2 10/25/2021 1609   BASOSABS 0.0 11/20/2021 0148   BASOSABS 0.0 10/25/2021 1609      Latest Ref Rng & Units 11/21/2021    4:34 AM 11/20/2021    8:23 AM 11/20/2021    1:48 AM  CMP  Glucose 70 - 99 mg/dL 207   105   BUN 8 - 23 mg/dL 77   62   Creatinine 0.61 - 1.24 mg/dL 5.61   4.86   Sodium 135 - 145 mmol/L 137  137  139   Potassium 3.5 - 5.1 mmol/L 4.5  4.2  3.5   Chloride 98 - 111 mmol/L 106   105   CO2 22 - 32 mmol/L 21   20   Calcium 8.9 - 10.3 mg/dL 8.2   6.7   Total Protein 6.5 - 8.1 g/dL 5.7   4.7   Total Bilirubin 0.3 - 1.2 mg/dL 0.5   0.7   Alkaline Phos 38 - 126 U/L 52   38   AST 15 - 41 U/L 15   17   ALT 0 - 44 U/L 19   18      Radiology Studies: CT Chest Wo Contrast  Result Date: 11/19/2021 CLINICAL DATA:  Cough, fever and hematuria. EXAM: CT CHEST, ABDOMEN AND PELVIS WITHOUT CONTRAST TECHNIQUE: Multidetector CT imaging of the chest, abdomen and pelvis was performed following the standard protocol without IV  contrast. RADIATION DOSE REDUCTION: This exam was performed according to the departmental dose-optimization program which includes automated exposure control, adjustment of the mA and/or kV according to patient size and/or use of iterative reconstruction technique. COMPARISON:  None Available. FINDINGS: CT CHEST FINDINGS Cardiovascular: There is mild to moderate severity calcification of the aortic arch and descending thoracic aorta, without evidence of aortic aneurysm. Normal heart size with marked severity coronary artery calcification. A very small pericardial effusion is seen. Mediastinum/Nodes: Subcentimeter AP window and pretracheal lymph nodes are noted. Thyroid gland, trachea, and esophagus demonstrate no significant findings. Lungs/Pleura: Mild posterior right upper lobe infiltrate is seen. Mild lingular and posterior bibasilar atelectatic changes are also noted. There is no evidence  of a pleural effusion or pneumothorax. Musculoskeletal: Marked severity multilevel degenerative changes seen throughout the thoracic spine. CT ABDOMEN PELVIS FINDINGS Hepatobiliary: No focal liver abnormality is seen. Subcentimeter gallstones are seen within what appears to be in markedly contracted gallbladder. There is no evidence of biliary dilatation. Pancreas: Unremarkable. No pancreatic ductal dilatation or surrounding inflammatory changes. Spleen: Normal in size without focal abnormality. Adrenals/Urinary Tract: Adrenal glands are unremarkable. Kidneys are normal in size. 2 mm and 3 mm nonobstructing renal calculi are seen within the lower pole of the left kidney. A 3.7 cm diameter cyst is seen within the anterior aspect of the mid right kidney. A 2.0 cm diameter cyst is noted along the posterior aspect of the mid left kidney. A 5.0 cm x 3.5 cm parapelvic left renal cyst is noted. Adjacent 4 mm calculi are seen within a contracted urinary bladder, along the expected region of the left UVJ (axial CT image 109, CT series  3). Stomach/Bowel: There is a small hiatal hernia. The appendix is surgically absent. No evidence of bowel wall thickening, distention, or inflammatory changes. Vascular/Lymphatic: Aortic atherosclerosis. No enlarged abdominal or pelvic lymph nodes. Reproductive: Prostate is unremarkable. Other: A 4.8 cm x 2.0 cm left inguinal hernia is seen. This contains fluid and fat. No abdominopelvic ascites. Musculoskeletal: Marked severity multilevel degenerative changes seen throughout the lumbar spine. IMPRESSION: 1. Mild posterior right upper lobe infiltrate with mild lingular and posterior bibasilar atelectasis. 2. Adjacent 4 mm calculi along the left UVJ. While these may be renal in origin, small bladder calculi cannot be excluded. 3. Cholelithiasis. 4. Small hiatal hernia. 5. Bilateral renal cysts (Bosniak 2). No additional follow-up imaging is recommended. This recommendation follows ACR consensus guidelines: Management of the Incidental Renal Mass on CT: A White Paper of the ACR Incidental Findings Committee. J Am Coll Radiol [phone]. Aortic Atherosclerosis (ICD10-I70.0). Electronically Signed   By: Virgina Norfolk M.D.   On: 11/19/2021 20:53   CT ABDOMEN PELVIS WO CONTRAST  Result Date: 11/19/2021 CLINICAL DATA:  Cough, fever and hematuria. EXAM: CT CHEST, ABDOMEN AND PELVIS WITHOUT CONTRAST TECHNIQUE: Multidetector CT imaging of the chest, abdomen and pelvis was performed following the standard protocol without IV contrast. RADIATION DOSE REDUCTION: This exam was performed according to the departmental dose-optimization program which includes automated exposure control, adjustment of the mA and/or kV according to patient size and/or use of iterative reconstruction technique. COMPARISON:  None Available. FINDINGS: CT CHEST FINDINGS Cardiovascular: There is mild to moderate severity calcification of the aortic arch and descending thoracic aorta, without evidence of aortic aneurysm. Normal heart size with  marked severity coronary artery calcification. A very small pericardial effusion is seen. Mediastinum/Nodes: Subcentimeter AP window and pretracheal lymph nodes are noted. Thyroid gland, trachea, and esophagus demonstrate no significant findings. Lungs/Pleura: Mild posterior right upper lobe infiltrate is seen. Mild lingular and posterior bibasilar atelectatic changes are also noted. There is no evidence of a pleural effusion or pneumothorax. Musculoskeletal: Marked severity multilevel degenerative changes seen throughout the thoracic spine. CT ABDOMEN PELVIS FINDINGS Hepatobiliary: No focal liver abnormality is seen. Subcentimeter gallstones are seen within what appears to be in markedly contracted gallbladder. There is no evidence of biliary dilatation. Pancreas: Unremarkable. No pancreatic ductal dilatation or surrounding inflammatory changes. Spleen: Normal in size without focal abnormality. Adrenals/Urinary Tract: Adrenal glands are unremarkable. Kidneys are normal in size. 2 mm and 3 mm nonobstructing renal calculi are seen within the lower pole of the left kidney. A 3.7 cm diameter cyst is seen  within the anterior aspect of the mid right kidney. A 2.0 cm diameter cyst is noted along the posterior aspect of the mid left kidney. A 5.0 cm x 3.5 cm parapelvic left renal cyst is noted. Adjacent 4 mm calculi are seen within a contracted urinary bladder, along the expected region of the left UVJ (axial CT image 109, CT series 3). Stomach/Bowel: There is a small hiatal hernia. The appendix is surgically absent. No evidence of bowel wall thickening, distention, or inflammatory changes. Vascular/Lymphatic: Aortic atherosclerosis. No enlarged abdominal or pelvic lymph nodes. Reproductive: Prostate is unremarkable. Other: A 4.8 cm x 2.0 cm left inguinal hernia is seen. This contains fluid and fat. No abdominopelvic ascites. Musculoskeletal: Marked severity multilevel degenerative changes seen throughout the lumbar spine.  IMPRESSION: 1. Mild posterior right upper lobe infiltrate with mild lingular and posterior bibasilar atelectasis. 2. Adjacent 4 mm calculi along the left UVJ. While these may be renal in origin, small bladder calculi cannot be excluded. 3. Cholelithiasis. 4. Small hiatal hernia. 5. Bilateral renal cysts (Bosniak 2). No additional follow-up imaging is recommended. This recommendation follows ACR consensus guidelines: Management of the Incidental Renal Mass on CT: A White Paper of the ACR Incidental Findings Committee. J Am Coll Radiol [phone]. Aortic Atherosclerosis (ICD10-I70.0). Electronically Signed   By: Virgina Norfolk M.D.   On: 11/19/2021 20:53   DG Chest 2 View  Result Date: 11/19/2021 CLINICAL DATA:  Suspected sepsis EXAM: CHEST - 2 VIEW COMPARISON:  Chest 11/18/2021 FINDINGS: Cardiac enlargement without heart failure. Mild right perihilar airspace disease could represent pneumonia. Mild right lower lobe atelectasis with elevated right hemidiaphragm. Mild left lower lobe atelectasis. No effusion. IMPRESSION: Question early right perihilar infiltrate. Right lower lobe atelectasis also present. Electronically Signed   By: Franchot Gallo M.D.   On: 11/19/2021 18:15     Scheduled Meds:  atorvastatin  20 mg Oral Daily   azithromycin  500 mg Oral Daily   fluticasone  1 spray Each Nare Daily   ipratropium-albuterol  3 mL Nebulization Q6H   methylPREDNISolone (SOLU-MEDROL) injection  80 mg Intravenous BID   Continuous Infusions:  sodium chloride 125 mL/hr at 11/21/21 0206   cefTRIAXone (ROCEPHIN)  IV 1 g (11/20/21 1747)   tocilizumab (ACTEMRA) - non-COVID treatment       LOS: 2 days   Time spent: [place], MD [facility] To contact the attending provider between 7A-7P or the covering provider during after hours 7P-7A, please log into the web site [email or web address] and access using universal [place] password for that web site. If you do not have the password,  please call the hospital operator.  11/21/2021, 9:04 AM

## 2021-11-22 ENCOUNTER — Inpatient Hospital Stay (HOSPITAL_COMMUNITY): Payer: Medicare Other

## 2021-11-22 DIAGNOSIS — M7989 Other specified soft tissue disorders: Secondary | ICD-10-CM | POA: Diagnosis not present

## 2021-11-22 DIAGNOSIS — J189 Pneumonia, unspecified organism: Secondary | ICD-10-CM | POA: Diagnosis not present

## 2021-11-22 LAB — COMPREHENSIVE METABOLIC PANEL
ALT: 19 U/L (ref 0–44)
AST: 16 U/L (ref 15–41)
Albumin: 2.5 g/dL — ABNORMAL LOW (ref 3.5–5.0)
Alkaline Phosphatase: 69 U/L (ref 38–126)
Anion gap: 10 (ref 5–15)
BUN: 71 mg/dL — ABNORMAL HIGH (ref 8–23)
CO2: 20 mmol/L — ABNORMAL LOW (ref 22–32)
Calcium: 8.3 mg/dL — ABNORMAL LOW (ref 8.9–10.3)
Chloride: 108 mmol/L (ref 98–111)
Creatinine, Ser: 3.73 mg/dL — ABNORMAL HIGH (ref 0.61–1.24)
GFR, Estimated: 16 mL/min — ABNORMAL LOW (ref >60)
Glucose, Bld: 324 mg/dL — ABNORMAL HIGH (ref 70–99)
Potassium: 4.2 mmol/L (ref 3.5–5.1)
Sodium: 138 mmol/L (ref 135–145)
Total Bilirubin: 0.3 mg/dL (ref 0.3–1.2)
Total Protein: 5.4 g/dL — ABNORMAL LOW (ref 6.5–8.1)

## 2021-11-22 LAB — CBC
HCT: 33 % — ABNORMAL LOW (ref 39.0–52.0)
Hemoglobin: 10.9 g/dL — ABNORMAL LOW (ref 13.0–17.0)
MCH: 29.4 pg (ref 26.0–34.0)
MCHC: 33 g/dL (ref 30.0–36.0)
MCV: 88.9 fL (ref 80.0–100.0)
Platelets: 135 10*3/uL — ABNORMAL LOW (ref 150–400)
RBC: 3.71 MIL/uL — ABNORMAL LOW (ref 4.22–5.81)
RDW: 13.9 % (ref 11.5–15.5)
WBC: 5.4 10*3/uL (ref 4.0–10.5)
nRBC: 0 % (ref 0.0–0.2)

## 2021-11-22 LAB — HEMOGLOBIN A1C
Hgb A1c MFr Bld: 6.9 % — ABNORMAL HIGH (ref 4.8–5.6)
Mean Plasma Glucose: 151.33 mg/dL

## 2021-11-22 LAB — GLUCOSE, CAPILLARY
Glucose-Capillary: 246 mg/dL — ABNORMAL HIGH (ref 70–99)
Glucose-Capillary: 199 mg/dL — ABNORMAL HIGH (ref 70–99)
Glucose-Capillary: 194 mg/dL — ABNORMAL HIGH (ref 70–99)

## 2021-11-22 MED ORDER — INSULIN ASPART 100 UNIT/ML IJ SOLN
2.0000 [IU] | Freq: Three times a day (TID) | INTRAMUSCULAR | Status: DC
  Administered 2021-11-22 – 2021-11-24 (×7): 2 [IU] via SUBCUTANEOUS

## 2021-11-22 MED ORDER — INSULIN ASPART 100 UNIT/ML IJ SOLN
0.0000 [IU] | Freq: Three times a day (TID) | INTRAMUSCULAR | Status: DC
  Administered 2021-11-22: 5 [IU] via SUBCUTANEOUS
  Administered 2021-11-22: 3 [IU] via SUBCUTANEOUS
  Administered 2021-11-23 (×2): 2 [IU] via SUBCUTANEOUS
  Administered 2021-11-23: 3 [IU] via SUBCUTANEOUS

## 2021-11-22 MED ORDER — VERAPAMIL HCL ER 120 MG PO TBCR
120.0000 mg | EXTENDED_RELEASE_TABLET | Freq: Every day | ORAL | Status: DC
Start: 2021-11-22 — End: 2021-11-23
  Administered 2021-11-22 – 2021-11-23 (×2): 120 mg via ORAL
  Filled 2021-11-22 (×2): qty 1

## 2021-11-22 MED ORDER — IPRATROPIUM-ALBUTEROL 0.5-2.5 (3) MG/3ML IN SOLN
3.0000 mL | Freq: Two times a day (BID) | RESPIRATORY_TRACT | Status: DC
  Filled 2021-11-22: qty 3

## 2021-11-22 MED ORDER — PREDNISONE 20 MG PO TABS
40.0000 mg | ORAL_TABLET | Freq: Every day | ORAL | Status: DC
  Administered 2021-11-23 – 2021-11-24 (×2): 40 mg via ORAL
  Filled 2021-11-22 (×2): qty 2

## 2021-11-22 NOTE — Progress Notes (Signed)
PROGRESS NOTE   Marcus Carr  PYP:[id number] DOB: 01/05/1941 DOA: 11/19/2021 PCP: Sharion Balloon, FNP  Brief Narrative:  47 white male known CKD 3B baseline creatinine 1.7 HTN, HLD, chronic low back pain on opioids follows with Dr. Nelva Bush HFpEF Prior mandibular osteomyelitis, prior tobacco abuse HLD It appears he does have episodic hypotension and syncope and was seen in the office 8/22 and worked up Subsequently he developed a fever and on 8/22 had a virtual visit COVID testing was done which was negative-doxycycline was called in Return to the clinic because of progressive weakness fatigue not eating drinking and was brought in by wheelchair he had intermittent dizziness as well and had cyclical fevers COVID test was negative In the office his O2 sats were noted to be 88% repeat blood pressures 80s over 40s he was advised to go to ED but refused They were unable to get IV access could not give fluids or draw labs-he was prescribed Cipro and Zofran Patient refused transport to ED went home but then presented with increasing confusion--- found to have a new AKI-BUN/creatinine baseline about 1.7 now 66/5.5 on admit COVID-positive CXR cardiac enlargement mild perihilar airspace disease CT chest-posterior right upper lobe infiltrate with lingular basilar atelectasis UVJ stone cholelithiasis bilateral renal cysts no additional follow-up recommended  Hospital-Problem based course  Fever without sepsis secondary to probable aspiration, COVID-positive Procalcitonin was elevated Actemra given 8/31 based on rising dimer, CRP Cut back steroids to p.o. prednisone 40 daily, IV ABX 8/29 now 2 oral completing Levaquin on 9/2 Get desat screen  AKI on admission FeNA indicating prerenal-DDx severe volume depletion/hypotension in the setting of diuretics Patient prior to admission on diclofenac, lisinopril 10, Lasix 40 which have all been held Was negative Ultrasound abdomen pelvis showed  left-sided ureteric jet Kidney function returning to normal [place] needs to be closer to baseline prior to decision for discharge   HFpEF On discharge resume atorvastatin, aspirin 81  HTN Hypotension is resolved resume verapamil XR at lower dose of 120  Former tobacco?  COPD Steroids changed to prednisone 40 on 9/1 Cessation counseling  Depression Hold Celexa 20 at this time given AKI and resume cautiously closer to discharge  Chronic low back pain Given severe AKI would replace Percocet with low-dose Dilaudid 1 mg every 6 as needed only and would not titrate until he is more clear  DVT prophylaxis: SCD Code Status: Full Family Communication: Discussed with wife on 8/30 Disposition:  Status is: Inpatient Remains inpatient appropriate because:    needs home health on discharge if stabilizing over the next day date of   Consultants:  None at this time  Procedures: No  Antimicrobials: See above   Subjective:  Awake coherent no distress Desatted some to 88%-nursing informed to see if patient can set up supine Otherwise well passing good urine     Objective: Vitals:   11/22/21 0904 11/22/21 0905 11/22/21 0930 11/22/21 1341  BP:  (!) 173/105 (!) 177/105 (!) 163/111  Pulse:  76 87 65  Resp:  17 (!) 21 17  Temp:   98.1 F (36.7 C) 98 F (36.7 C)  TempSrc:   Oral Oral  SpO2: 96% 96%  96%  Weight:      Height:        Intake/Output Summary (Last 24 hours) at 11/22/2021 1620 Last data filed at 11/22/2021 1157 Gross per 24 hour  Intake 850 ml  Output 1750 ml  Net -900 ml    [employer]  11/19/21 1719 11/22/21 0500  Weight: 127.5 kg 127.7 kg    Examination:  Thick neck Mallampati 4 no icterus pallor When I take the patient off oxygen he stays in 95% range S1-S2 no murmur no rub no gallop Abdomen obese nontender no rebound ROM intact moving 4 limbs equally  Data Reviewed: personally reviewed   CBC    Component Value Date/Time   WBC 5.4 11/22/2021  0801   RBC 3.71 (L) 11/22/2021 0801   HGB 10.9 (L) 11/22/2021 0801   HGB 13.1 10/25/2021 1609   HCT 33.0 (L) 11/22/2021 0801   HCT 38.6 10/25/2021 1609   PLT 135 (L) 11/22/2021 0801   PLT 129 (L) 10/25/2021 1609   MCV 88.9 11/22/2021 0801   MCV 88 10/25/2021 1609   MCH 29.4 11/22/2021 0801   MCHC 33.0 11/22/2021 0801   RDW 13.9 11/22/2021 0801   RDW 14.3 10/25/2021 1609   LYMPHSABS 0.4 (L) 11/20/2021 0148   LYMPHSABS 1.4 10/25/2021 1609   MONOABS 0.4 11/20/2021 0148   EOSABS 0.1 11/20/2021 0148   EOSABS 0.2 10/25/2021 1609   BASOSABS 0.0 11/20/2021 0148   BASOSABS 0.0 10/25/2021 1609      Latest Ref Rng & Units 11/22/2021    8:01 AM 11/21/2021    4:34 AM 11/20/2021    8:23 AM  CMP  Glucose 70 - 99 mg/dL 324  207    BUN 8 - 23 mg/dL 71  77    Creatinine 0.61 - 1.24 mg/dL 3.73  5.61    Sodium 135 - 145 mmol/L 138  137  137   Potassium 3.5 - 5.1 mmol/L 4.2  4.5  4.2   Chloride 98 - 111 mmol/L 108  106    CO2 22 - 32 mmol/L 20  21    Calcium 8.9 - 10.3 mg/dL 8.3  8.2    Total Protein 6.5 - 8.1 g/dL 5.4  5.7    Total Bilirubin 0.3 - 1.2 mg/dL 0.3  0.5    Alkaline Phos 38 - 126 U/L 69  52    AST 15 - 41 U/L 16  15    ALT 0 - 44 U/L 19  19       Radiology Studies: VAS [place] LOWER EXTREMITY VENOUS (DVT)  Result Date: 11/22/2021  Lower Venous DVT Study Patient Name:  Marcus Carr  Date of Exam:   11/22/2021 Medical Rec #: [id number]           Accession #:    [id number] Date of Birth: 21-May-1940          Patient Gender: M Patient Age:   81 years Exam Location:  [facility] Procedure:      VAS [place] LOWER EXTREMITY VENOUS (DVT) Referring Phys: Nita Sells --------------------------------------------------------------------------------  Indications: Swelling.  Comparison Study: No previous exam noted. Performing Technologist: Bobetta Lime BS, RVT  Examination Guidelines: A complete evaluation includes B-mode imaging, spectral Doppler, color Doppler, and power Doppler as  needed of all accessible portions of each vessel. Bilateral testing is considered an integral part of a complete examination. Limited examinations for reoccurring indications may be performed as noted. The reflux portion of the exam is performed with the patient in reverse Trendelenburg.  +---------+---------------+---------+-----------+----------+--------------+ RIGHT    CompressibilityPhasicitySpontaneityPropertiesThrombus Aging +---------+---------------+---------+-----------+----------+--------------+ CFV      Full           Yes      Yes                                 +---------+---------------+---------+-----------+----------+--------------+  SFJ      Full                                                        +---------+---------------+---------+-----------+----------+--------------+ FV Prox  Full                                                        +---------+---------------+---------+-----------+----------+--------------+ FV Mid   Full                                                        +---------+---------------+---------+-----------+----------+--------------+ FV DistalFull                                                        +---------+---------------+---------+-----------+----------+--------------+ PFV      Full                                                        +---------+---------------+---------+-----------+----------+--------------+ POP      Full           Yes      Yes                                 +---------+---------------+---------+-----------+----------+--------------+ PTV      Full                                                        +---------+---------------+---------+-----------+----------+--------------+ PERO     Full                                                        +---------+---------------+---------+-----------+----------+--------------+    +---------+---------------+---------+-----------+----------+--------------+ LEFT     CompressibilityPhasicitySpontaneityPropertiesThrombus Aging +---------+---------------+---------+-----------+----------+--------------+ CFV      Full           Yes      Yes                                 +---------+---------------+---------+-----------+----------+--------------+ SFJ      Full                                                        +---------+---------------+---------+-----------+----------+--------------+  FV Prox  Full                                                        +---------+---------------+---------+-----------+----------+--------------+ FV Mid   Full                                                        +---------+---------------+---------+-----------+----------+--------------+ FV DistalFull                                                        +---------+---------------+---------+-----------+----------+--------------+ PFV      Full                                                        +---------+---------------+---------+-----------+----------+--------------+ POP      Full           Yes      Yes                                 +---------+---------------+---------+-----------+----------+--------------+ PTV      Full                                                        +---------+---------------+---------+-----------+----------+--------------+ PERO     Full                                                        +---------+---------------+---------+-----------+----------+--------------+     Summary: BILATERAL: - No evidence of deep vein thrombosis seen in the lower extremities, bilaterally. -No evidence of popliteal cyst, bilaterally.   *See table(s) above for measurements and observations. Electronically signed by Servando Snare MD on 11/22/2021 at 3:10:10 PM.    Final    US RENAL  Result Date: 11/21/2021 CLINICAL DATA:  AKI EXAM:  RENAL / URINARY TRACT ULTRASOUND COMPLETE COMPARISON:  CT abdomen/pelvis 2 days prior FINDINGS: Right Kidney: Renal measurements: 11.3 cm x 6.7 cm x 5.0 cm = volume: 164 mL. A simple cyst in the interpolar region measuring up to 3.8 cm is noted for which no specific imaging follow-up is required. No other masses are seen. There is no hydronephrosis. Left Kidney: Renal measurements: 12.0 cm x 5.6 cm x 5.0 cm = volume: 177 mL. Parenchymal echogenicity is normal. There is dilation of the left collecting system which is similar to the prior study. Bladder: Appears normal for degree of bladder distention. The left ureteral jet was identified. Other: None.  IMPRESSION: 1. Dilation of the left collecting system without frank hydronephrosis is similar to the prior study. Findings may reflect UPJ stricture. 2. The left ureteral jet was identified Electronically Signed   By: Valetta Mole M.D.   On: 11/21/2021 16:26     Scheduled Meds:  atorvastatin  20 mg Oral Daily   azithromycin  500 mg Oral Daily   fluticasone  1 spray Each Nare Daily   insulin aspart  0-15 Units Subcutaneous TID WC   insulin aspart  2 Units Subcutaneous TID WC   ipratropium-albuterol  3 mL Nebulization BID   [START ON 11/23/2021] predniSONE  40 mg Oral QAC breakfast   Continuous Infusions:  sodium chloride 125 mL/hr at 11/22/21 1334   cefTRIAXone (ROCEPHIN)  IV 1 g (11/21/21 1736)     LOS: 3 days   Time spent: [place], MD [facility] To contact the attending provider between 7A-7P or the covering provider during after hours 7P-7A, please log into the web site [email or web address] and access using universal [place] password for that web site. If you do not have the password, please call the hospital operator.  11/22/2021, 4:20 PM

## 2021-11-22 NOTE — Inpatient Diabetes Management (Signed)
Inpatient Diabetes Program Recommendations  AACE/ADA: New Consensus Statement on Inpatient Glycemic Control (2015)  Target Ranges:  Prepandial:   less than 140 mg/dL      Peak postprandial:   less than 180 mg/dL (1-2 hours)      Critically ill patients:  140 - 180 mg/dL     Review of Glycemic Control  Diabetes history: no DM   Current orders for Inpatient glycemic control:  None Solumedrol 80 mg bid Lab glucose 324 this am Notified Dr. Verlon Au for him to evaluate during rounds.  Thanks,  Tama Headings RN, MSN, BC-ADM Inpatient Diabetes Coordinator Team Pager [phone] (8a-5p)

## 2021-11-22 NOTE — Progress Notes (Signed)
Bilateral LE venous duplex study completed. Please see CV Proc for preliminary results.  Ausha Sieh BS, RVT 11/22/2021 11:00 AM

## 2021-11-22 NOTE — Progress Notes (Signed)
Physical Therapy Treatment Patient Details Name: Marcus Carr MRN: [id number] DOB: [birth year] Today's Date: 11/22/2021   History of Present Illness Pt is a 81 year old man admitted on 8/29 with CAP, COVID positive. PMH: COPDm CHF, CKD, HLD, low back pain, mandibular osteomyelitis, prior tobacco use, depression.    PT Comments    Pt demonstrating improving activity tolerance, ambulating the equivalent of a short hallway distance with use of RW and min steadying assist. Pt experiencing dyspnea on exertion with and without accompanying desats, PT anticipates pt is deconditioned as well as experiencing O2-related shortness of breath. RA trialled during mobility; at rest, SPO2 94% on RA, desat to 86% on RA during gait so pt placed on 2LO2 for recovery of sats >90%. Pursed lip breathing encouraged throughout. Will conitnue to follow.     Recommendations for follow up therapy are one component of a multi-disciplinary discharge planning process, led by the attending physician.  Recommendations may be updated based on patient status, additional functional criteria and insurance authorization.  Follow Up Recommendations  Home health PT     Assistance Recommended at Discharge Intermittent Supervision/Assistance  Patient can return home with the following A little help with walking and/or transfers;A little help with bathing/dressing/bathroom   Equipment Recommendations  None recommended by PT    Recommendations for Other Services       Precautions / Restrictions Precautions Precautions: Fall Restrictions Weight Bearing Restrictions: No     Mobility  Bed Mobility Overal bed mobility: Needs Assistance Bed Mobility: Sit to Supine       Sit to supine: Supervision   General bed mobility comments: for safety; pt up in chair upon PT arrival to room    Transfers Overall transfer level: Needs assistance Equipment used: Rolling walker (2 wheels) Transfers: Sit to/from Stand Sit to  Stand: Min assist           General transfer comment: steadying assist once standing    Ambulation/Gait Ambulation/Gait assistance: Min assist Gait Distance (Feet): 45 Feet Assistive device: Rolling walker (2 wheels) Gait Pattern/deviations: Step-through pattern, Decreased stride length, Trunk flexed Gait velocity: decr     General Gait Details: cues for upright posture, min steadying assist and room navigation   Stairs             Wheelchair Mobility    Modified Rankin (Stroke Patients Only)       Balance Overall balance assessment: Needs assistance Sitting-balance support: Feet supported, No upper extremity supported Sitting balance-Leahy Scale: Good     Standing balance support: Bilateral upper extremity supported, During functional activity Standing balance-Leahy Scale: Poor Standing balance comment: reliant on RW                            Cognition Arousal/Alertness: Awake/alert Behavior During Therapy: WFL for tasks assessed/performed Overall Cognitive Status: Within Functional Limits for tasks assessed                                 General Comments: slow processing        Exercises      General Comments General comments (skin integrity, edema, etc.): RA trialled during mobility; at rest, SPO2 94% on RA, desat to 86% on RA during gait so pt placed on 2LO2 for recovery of sats >90%. Pursed lip breathing encouraged throughout.      Pertinent Vitals/Pain Pain Assessment Pain Assessment:  Faces Faces Pain Scale: Hurts little more Pain Location: bilat knees, chronic in nature Pain Descriptors / Indicators: Sore, Discomfort Pain Intervention(s): Limited activity within patient's tolerance, Monitored during session, Repositioned    Home Living                          Prior Function            PT Goals (current goals can now be found in the care plan section) Acute Rehab PT Goals Patient Stated Goal:  home PT Goal Formulation: With patient Time For Goal Achievement: 12/05/21 Potential to Achieve Goals: Good Progress towards PT goals: Progressing toward goals    Frequency    Min 3X/week      PT Plan Current plan remains appropriate    Co-evaluation              AM-PAC PT "6 Clicks" Mobility   Outcome Measure  Help needed turning from your back to your side while in a flat bed without using bedrails?: A Little Help needed moving from lying on your back to sitting on the side of a flat bed without using bedrails?: A Little Help needed moving to and from a bed to a chair (including a wheelchair)?: A Little Help needed standing up from a chair using your arms (e.g., wheelchair or bedside chair)?: A Little Help needed to walk in hospital room?: A Little Help needed climbing 3-5 steps with a railing? : A Lot 6 Click Score: 17    End of Session Equipment Utilized During Treatment: Oxygen Activity Tolerance: Patient tolerated treatment well Patient left: with call bell/phone within reach;in bed;with bed alarm set Nurse Communication: Mobility status PT Visit Diagnosis: Other abnormalities of gait and mobility (R26.89);Muscle weakness (generalized) (M62.81)     Time: 4196-2229 PT Time Calculation (min) (ACUTE ONLY): 15 min  Charges:  $Therapeutic Activity: 8-22 mins                     Stacie Glaze, PT DPT Acute Rehabilitation Services Pager [phone]  Office [phone]   Roxine Caddy E Ruffin Pyo 11/22/2021, 2:53 PM

## 2021-11-22 NOTE — TOC Initial Note (Signed)
Transition of Care [facility]) - Initial/Assessment Note    Patient Details  Name: Marcus Carr MRN: [id number] Date of Birth: Jul 20, 1940  [facility] CM/SW Contact:    Carles Collet, RN Phone Number: 11/22/2021, 12:00 PM  Clinical Narrative:             Damaris Schooner w payient's wife over the phone.  Discussed resources at home. Patient has RW, may be interested in shower seat at DC as they have two garden tubs with showers.  Patient does not have oxygen at home, hopeful to wean off current 2L needed acutely, prior to DC. We discussed support at home and she is agreeable to [facility] services, does not have preference for provider. Several HH agencies asked do not service that area. [facility] able to accept for [facility] PT OT.  Patient will need HH order for PT OT with face to face. Requested Dr Verlon Au to place order so [facility] agency can process.   TOC will continue to follow for DC needs.       Expected Discharge Plan: [place] Barriers to Discharge: Continued Medical Work up   Patient Goals and CMS Choice Patient states their goals for this hospitalization and ongoing recovery are:: return home CMS Medicare.gov Compare Post Acute Care list provided to:: Patient Represenative (must comment) Choice offered to / list presented to : Spouse  Expected Discharge Plan and Services Expected Discharge Plan: [place]   Discharge Planning Services: CM Consult Post Acute Care Choice: [place] arrangements for the past 2 months: Single Family Home                           HH Arranged: PT, OT HH Agency: [place] Date [facility] Agency Contacted: 11/22/21 Time HH Agency Contacted: 66 Representative spoke with at [place]: Claiborne Billings  Prior Living Arrangements/Services Living arrangements for the past 2 months: [place] Lives with:: Spouse              Current home services: DME (RW)    Activities of Daily Living       Permission Sought/Granted                  Emotional Assessment              Admission diagnosis:  CAP (community acquired pneumonia) [J18.9] Acute renal failure superimposed on chronic kidney disease, unspecified CKD stage, unspecified acute renal failure type ([place]) [N17.9, N18.9] Community acquired pneumonia of right lung, unspecified part of lung [J18.9] COVID-19 [U07.1] Patient Active Problem List   Diagnosis Date Noted   COPD with acute exacerbation ([place]) 11/20/2021   COVID-19 virus infection 11/20/2021   Acute renal failure superimposed on stage 3b chronic kidney disease ([place]) 11/20/2021   Acute respiratory failure with hypoxia ([place]) 11/20/2021   Generalized weakness 11/20/2021   Acute anemia 11/20/2021   Chronic diastolic CHF (congestive heart failure) ([place]) 11/20/2021   Allergic rhinitis 11/20/2021   CAP (community acquired pneumonia) 11/19/2021   Leg swelling 04/08/2021   Nonspecific abnormal electrocardiogram (ECG) (EKG) 04/08/2021   Degeneration of lumbar intervertebral disc 67/89/3810   Metabolic syndrome 17/51/0258   Abscess, jaw    Osteomyelitis of mandible 11/07/2014   Hypertension 11/07/2014   Gout 11/07/2014   Chronic kidney disease 11/07/2014   Hyperlipidemia 11/07/2014   Morbid obesity ([place]) 11/07/2014   Back pain 11/07/2014   Anxiety 11/07/2014   Chronic  mastoiditis of both sides 11/07/2014   Thyroid nodule 11/07/2014   Hx of smoking 11/07/2014   Poor dentition 11/07/2014   PCP:  Sharion Balloon, FNP Pharm[facility]   [id number] [place]Phone: [phone]: [phone] [facility] [facility] [place] [place]Phone: [phone]: [phone] [facility] - [place] [address]Phone: [phone]: [phone]    Social Determinants of Health (SDOH) Interventions    Readmission Risk Interventions     No data to display

## 2021-11-23 DIAGNOSIS — J189 Pneumonia, unspecified organism: Secondary | ICD-10-CM | POA: Diagnosis not present

## 2021-11-23 LAB — COMPREHENSIVE METABOLIC PANEL
ALT: 29 U/L (ref 0–44)
AST: 26 U/L (ref 15–41)
Albumin: 2.5 g/dL — ABNORMAL LOW (ref 3.5–5.0)
Alkaline Phosphatase: 58 U/L (ref 38–126)
Anion gap: 7 (ref 5–15)
BUN: 65 mg/dL — ABNORMAL HIGH (ref 8–23)
CO2: 23 mmol/L (ref 22–32)
Calcium: 8.8 mg/dL — ABNORMAL LOW (ref 8.9–10.3)
Chloride: 112 mmol/L — ABNORMAL HIGH (ref 98–111)
Creatinine, Ser: 2.52 mg/dL — ABNORMAL HIGH (ref 0.61–1.24)
GFR, Estimated: 25 mL/min — ABNORMAL LOW (ref >60)
Glucose, Bld: 166 mg/dL — ABNORMAL HIGH (ref 70–99)
Potassium: 4.2 mmol/L (ref 3.5–5.1)
Sodium: 142 mmol/L (ref 135–145)
Total Bilirubin: 0.5 mg/dL (ref 0.3–1.2)
Total Protein: 5.2 g/dL — ABNORMAL LOW (ref 6.5–8.1)

## 2021-11-23 LAB — GLUCOSE, CAPILLARY
Glucose-Capillary: 151 mg/dL — ABNORMAL HIGH (ref 70–99)
Glucose-Capillary: 142 mg/dL — ABNORMAL HIGH (ref 70–99)
Glucose-Capillary: 137 mg/dL — ABNORMAL HIGH (ref 70–99)
Glucose-Capillary: 180 mg/dL — ABNORMAL HIGH (ref 70–99)

## 2021-11-23 LAB — CBC
HCT: 33.5 % — ABNORMAL LOW (ref 39.0–52.0)
Hemoglobin: 11.8 g/dL — ABNORMAL LOW (ref 13.0–17.0)
MCH: 30.3 pg (ref 26.0–34.0)
MCHC: 35.2 g/dL (ref 30.0–36.0)
MCV: 85.9 fL (ref 80.0–100.0)
Platelets: 143 10*3/uL — ABNORMAL LOW (ref 150–400)
RBC: 3.9 MIL/uL — ABNORMAL LOW (ref 4.22–5.81)
RDW: 14.2 % (ref 11.5–15.5)
WBC: 7.7 10*3/uL (ref 4.0–10.5)
nRBC: 0 % (ref 0.0–0.2)

## 2021-11-23 MED ORDER — OXYCODONE-ACETAMINOPHEN 5-325 MG PO TABS: 1.0000 | ORAL_TABLET | Freq: Two times a day (BID) | ORAL | Status: DC | PRN

## 2021-11-23 MED ORDER — ASPIRIN 81 MG PO TBEC
81.0000 mg | DELAYED_RELEASE_TABLET | Freq: Every day | ORAL | Status: DC
  Administered 2021-11-23 – 2021-11-24 (×2): 81 mg via ORAL
  Filled 2021-11-23 (×2): qty 1

## 2021-11-23 MED ORDER — CITALOPRAM HYDROBROMIDE 20 MG PO TABS
20.0000 mg | ORAL_TABLET | Freq: Every day | ORAL | Status: DC
  Administered 2021-11-23 – 2021-11-24 (×2): 20 mg via ORAL
  Filled 2021-11-23 (×2): qty 1

## 2021-11-23 MED ORDER — HYDRALAZINE HCL 20 MG/ML IJ SOLN
5.0000 mg | INTRAMUSCULAR | Status: DC | PRN
  Filled 2021-11-23: qty 1

## 2021-11-23 MED ORDER — VERAPAMIL HCL ER 180 MG PO TBCR
180.0000 mg | EXTENDED_RELEASE_TABLET | Freq: Every day | ORAL | Status: DC
  Administered 2021-11-24: 180 mg via ORAL
  Filled 2021-11-23: qty 1

## 2021-11-23 MED ORDER — OXYCODONE HCL 5 MG PO TABS
5.0000 mg | ORAL_TABLET | Freq: Two times a day (BID) | ORAL | Status: DC | PRN
  Administered 2021-11-24: 5 mg via ORAL
  Filled 2021-11-23: qty 1

## 2021-11-23 MED ORDER — GLIMEPIRIDE 1 MG PO TABS
1.0000 mg | ORAL_TABLET | Freq: Every day | ORAL | Status: DC
  Administered 2021-11-23 – 2021-11-24 (×2): 1 mg via ORAL
  Filled 2021-11-23 (×2): qty 1

## 2021-11-23 MED ORDER — OXYCODONE-ACETAMINOPHEN 10-325 MG PO TABS
1.0000 | ORAL_TABLET | Freq: Two times a day (BID) | ORAL | Status: DC | PRN
Start: 2021-11-23 — End: 2021-11-23

## 2021-11-23 NOTE — Progress Notes (Signed)
PROGRESS NOTE   Marcus Carr  FBP:[id number] DOB: Jul 17, 1940 DOA: 11/19/2021 PCP: Sharion Balloon, FNP  Brief Narrative:  26 white male known CKD 3B baseline creatinine 1.7 HTN, HLD, chronic low back pain on opioids follows with Dr. Nelva Bush HFpEF Prior mandibular osteomyelitis, prior tobacco abuse HLD It appears he does have episodic hypotension and syncope and was seen in the office 8/22 and worked up Subsequently he developed a fever and on 8/22 had a virtual visit COVID testing was done which was negative-doxycycline was called in Return to the clinic because of progressive weakness fatigue not eating drinking and was brought in by wheelchair he had intermittent dizziness as well and had cyclical fevers COVID test was negative In the office his O2 sats were noted to be 88% repeat blood pressures 80s over 40s he was advised to go to ED but refused They were unable to get IV access could not give fluids or draw labs-he was prescribed Cipro and Zofran Patient refused transport to ED went home but then presented with increasing confusion--- found to have a new AKI-BUN/creatinine baseline about 1.7 now 66/5.5 on admit COVID-positive CXR cardiac enlargement mild perihilar airspace disease CT chest-posterior right upper lobe infiltrate with lingular basilar atelectasis UVJ stone cholelithiasis bilateral renal cysts no additional follow-up recommended  Hospital-Problem based course  Fever without sepsis secondary to probable aspiration, COVID-positive Procalcitonin was elevated Actemra given 8/31 based on rising dimer, CRP--ultrasound lower extremities was negative for clot Cut back steroids to p.o. prednisone 40 daily, IV ABX 8/29 now 2 oral completing Levaquin on 9/2 and monitor trends Patient will need at least 2 L of oxygen at discharge  AKI on admission-nonobstructive UVJ stone, multiple renal cysts FeNA indicating prerenal-DDx severe volume depletion/hypotension in the setting of  diuretics Patient prior to admission on diclofenac, lisinopril 10, Lasix 40 which have all been held CT abdomen pelvis on admit noncontrast was negative for obstructive stones Ultrasound abdomen pelvis showed left-sided ureteric jet and I discussed the case with urology who felt no further work-up was required Kidney function returning to normal slowly with IV fluid repletion 50 cc/H and on discharge would have to hold nephrotoxins and may be just start back low-dose Lasix  Impaired glucose tolerance from probable steroids for treatment of COVID/COPD Sugars as high as 300-sliding scale initiated Discussed with family may need to go home on low doses of hypoglycemic agent-we will start Amaryl a.m. 1 mg tomorrow and this may need to be titrated as an outpatient   HFpEF On discharge resume atorvastatin, aspirin 81  HTN with hypotension on admission (now resolved)   Verapamil was gradually titrated back to home dose of 180 given hypertension  Former tobacco?  COPD Steroids changed to prednisone 40 on 9/1 Cessation counseling  Depression Hold Celexa 20 at this time given AKI and resume at discharge if kidney function is reasonable  Chronic low back pain Given Dilaudid because of AKI initially Now resuming home dose of Percocet 10/325 every 12  DVT prophylaxis: SCD Code Status: Full Family Communication: Discussed with wife on 9/2 and updated [place] Disposition:  Status is: Inpatient Remains inpatient appropriate because:    needs home health on discharge if stabilizing over next 24 hours   Consultants:  None at this time  Procedures: No  Antimicrobials: See above   Subjective:  He is doing fair with no fever no chills no issues Has no complaints--- I have encouraged him to sit up from We discussed hyperglycemia and the  treatment for this and discussed his kidneys his wife demonstrates good understanding    Objective: Vitals:   11/23/21 0044 11/23/21 0100 11/23/21 0432  11/23/21 0900  BP:  (!) 175/88 (!) 172/87 (!) 161/95  Pulse: 72 69 61 67  Resp: (!) '28 20 18 '$ (!) 22  Temp:   98.6 F (37 C) 98.2 F (36.8 C)  TempSrc:   Oral Oral  SpO2: 94% 96% 96% 95%  Weight:   127.5 kg   Height:        Intake/Output Summary (Last 24 hours) at 11/23/2021 0946 Last data filed at 11/23/2021 0840 Gross per 24 hour  Intake --  Output 4375 ml  Net -4375 ml    Filed Weights   11/19/21 1719 11/22/21 0500 11/23/21 0432  Weight: 127.5 kg 127.7 kg 127.5 kg    Examination:  Thick neck Mallampati 4 no icterus pallor O2 sat reasonable Thick neck Mallampati 4 S1-S2 no murmur does have intermittent PVCs Overall chest is clear No lower extremity edema moving 4 limbs equally Abdomen is obese nontender no rebound Neurologically seems intact  Data Reviewed: personally reviewed   CBC    Component Value Date/Time   WBC 7.7 11/23/2021 0559   RBC 3.90 (L) 11/23/2021 0559   HGB 11.8 (L) 11/23/2021 0559   HGB 13.1 10/25/2021 1609   HCT 33.5 (L) 11/23/2021 0559   HCT 38.6 10/25/2021 1609   PLT 143 (L) 11/23/2021 0559   PLT 129 (L) 10/25/2021 1609   MCV 85.9 11/23/2021 0559   MCV 88 10/25/2021 1609   MCH 30.3 11/23/2021 0559   MCHC 35.2 11/23/2021 0559   RDW 14.2 11/23/2021 0559   RDW 14.3 10/25/2021 1609   LYMPHSABS 0.4 (L) 11/20/2021 0148   LYMPHSABS 1.4 10/25/2021 1609   MONOABS 0.4 11/20/2021 0148   EOSABS 0.1 11/20/2021 0148   EOSABS 0.2 10/25/2021 1609   BASOSABS 0.0 11/20/2021 0148   BASOSABS 0.0 10/25/2021 1609      Latest Ref Rng & Units 11/23/2021    5:59 AM 11/22/2021    8:01 AM 11/21/2021    4:34 AM  CMP  Glucose 70 - 99 mg/dL 166  324  207   BUN 8 - 23 mg/dL 65  71  77   Creatinine 0.61 - 1.24 mg/dL 2.52  3.73  5.61   Sodium 135 - 145 mmol/L 142  138  137   Potassium 3.5 - 5.1 mmol/L 4.2  4.2  4.5   Chloride 98 - 111 mmol/L 112  108  106   CO2 22 - 32 mmol/L '23  20  21   '$ Calcium 8.9 - 10.3 mg/dL 8.8  8.3  8.2   Total Protein 6.5 - 8.1 g/dL  5.2  5.4  5.7   Total Bilirubin 0.3 - 1.2 mg/dL 0.5  0.3  0.5   Alkaline Phos 38 - 126 U/L 58  69  52   AST 15 - 41 U/L '26  16  15   '$ ALT 0 - 44 U/L '29  19  19      '$ Radiology Studies: VAS [place] LOWER EXTREMITY VENOUS (DVT)  Result Date: 11/22/2021  Lower Venous DVT Study Patient Name:  PIERCEN COVINO Buxbaum  Date of Exam:   11/22/2021 Medical Rec #: [id number]           Accession #:    [id number] Date of Birth: 1940/10/13          Patient Gender: M Patient Age:   47 years  Exam Location:  [facility] Procedure:      VAS [place] LOWER EXTREMITY VENOUS (DVT) Referring Phys: [facility] Montarius Kitagawa --------------------------------------------------------------------------------  Indications: Swelling.  Comparison Study: No previous exam noted. Performing Technologist: Bobetta Lime BS, RVT  Examination Guidelines: A complete evaluation includes B-mode imaging, spectral Doppler, color Doppler, and power Doppler as needed of all accessible portions of each vessel. Bilateral testing is considered an integral part of a complete examination. Limited examinations for reoccurring indications may be performed as noted. The reflux portion of the exam is performed with the patient in reverse Trendelenburg.  +---------+---------------+---------+-----------+----------+--------------+ RIGHT    CompressibilityPhasicitySpontaneityPropertiesThrombus Aging +---------+---------------+---------+-----------+----------+--------------+ CFV      Full           Yes      Yes                                 +---------+---------------+---------+-----------+----------+--------------+ SFJ      Full                                                        +---------+---------------+---------+-----------+----------+--------------+ FV Prox  Full                                                        +---------+---------------+---------+-----------+----------+--------------+ FV Mid   Full                                                         +---------+---------------+---------+-----------+----------+--------------+ FV DistalFull                                                        +---------+---------------+---------+-----------+----------+--------------+ PFV      Full                                                        +---------+---------------+---------+-----------+----------+--------------+ POP      Full           Yes      Yes                                 +---------+---------------+---------+-----------+----------+--------------+ PTV      Full                                                        +---------+---------------+---------+-----------+----------+--------------+ PERO     Full                                                        +---------+---------------+---------+-----------+----------+--------------+   +---------+---------------+---------+-----------+----------+--------------+  LEFT     CompressibilityPhasicitySpontaneityPropertiesThrombus Aging +---------+---------------+---------+-----------+----------+--------------+ CFV      Full           Yes      Yes                                 +---------+---------------+---------+-----------+----------+--------------+ SFJ      Full                                                        +---------+---------------+---------+-----------+----------+--------------+ FV Prox  Full                                                        +---------+---------------+---------+-----------+----------+--------------+ FV Mid   Full                                                        +---------+---------------+---------+-----------+----------+--------------+ FV DistalFull                                                        +---------+---------------+---------+-----------+----------+--------------+ PFV      Full                                                         +---------+---------------+---------+-----------+----------+--------------+ POP      Full           Yes      Yes                                 +---------+---------------+---------+-----------+----------+--------------+ PTV      Full                                                        +---------+---------------+---------+-----------+----------+--------------+ PERO     Full                                                        +---------+---------------+---------+-----------+----------+--------------+     Summary: BILATERAL: - No evidence of deep vein thrombosis seen in the lower extremities, bilaterally. -No evidence of popliteal cyst, bilaterally.   *See table(s) above for measurements and observations. Electronically signed by Servando Snare MD on 11/22/2021 at 3:10:10 PM.  Final    US RENAL  Result Date: 11/21/2021 CLINICAL DATA:  AKI EXAM: RENAL / URINARY TRACT ULTRASOUND COMPLETE COMPARISON:  CT abdomen/pelvis 2 days prior FINDINGS: Right Kidney: Renal measurements: 11.3 cm x 6.7 cm x 5.0 cm = volume: 164 mL. A simple cyst in the interpolar region measuring up to 3.8 cm is noted for which no specific imaging follow-up is required. No other masses are seen. There is no hydronephrosis. Left Kidney: Renal measurements: 12.0 cm x 5.6 cm x 5.0 cm = volume: 177 mL. Parenchymal echogenicity is normal. There is dilation of the left collecting system which is similar to the prior study. Bladder: Appears normal for degree of bladder distention. The left ureteral jet was identified. Other: None. IMPRESSION: 1. Dilation of the left collecting system without frank hydronephrosis is similar to the prior study. Findings may reflect UPJ stricture. 2. The left ureteral jet was identified Electronically Signed   By: Valetta Mole M.D.   On: 11/21/2021 16:26     Scheduled Meds:  atorvastatin  20 mg Oral Daily   azithromycin  500 mg Oral Daily   fluticasone  1 spray Each Nare Daily   insulin  aspart  0-15 Units Subcutaneous TID WC   insulin aspart  2 Units Subcutaneous TID WC   predniSONE  40 mg Oral QAC breakfast   verapamil  120 mg Oral Daily   Continuous Infusions:  sodium chloride 50 mL/hr at 11/23/21 0850   cefTRIAXone (ROCEPHIN)  IV 1 g (11/22/21 1738)     LOS: 4 days   Time spent: [place], MD [facility] To contact the attending provider between 7A-7P or the covering provider during after hours 7P-7A, please log into the web site [email or web address] and access using universal [place] password for that web site. If you do not have the password, please call the hospital operator.  11/23/2021, 9:46 AM

## 2021-11-24 DIAGNOSIS — J189 Pneumonia, unspecified organism: Secondary | ICD-10-CM | POA: Diagnosis not present

## 2021-11-24 LAB — BASIC METABOLIC PANEL
Anion gap: 7 (ref 5–15)
BUN: 63 mg/dL — ABNORMAL HIGH (ref 8–23)
CO2: 22 mmol/L (ref 22–32)
Calcium: 8.7 mg/dL — ABNORMAL LOW (ref 8.9–10.3)
Chloride: 113 mmol/L — ABNORMAL HIGH (ref 98–111)
Creatinine, Ser: 2.29 mg/dL — ABNORMAL HIGH (ref 0.61–1.24)
GFR, Estimated: 28 mL/min — ABNORMAL LOW (ref >60)
Glucose, Bld: 99 mg/dL (ref 70–99)
Potassium: 4.1 mmol/L (ref 3.5–5.1)
Sodium: 142 mmol/L (ref 135–145)

## 2021-11-24 LAB — CULTURE, BLOOD (ROUTINE X 2)
Culture: NO GROWTH
Culture: NO GROWTH
Special Requests: ADEQUATE
Special Requests: ADEQUATE

## 2021-11-24 LAB — GLUCOSE, CAPILLARY
Glucose-Capillary: 92 mg/dL (ref 70–99)
Glucose-Capillary: 82 mg/dL (ref 70–99)

## 2021-11-24 MED ORDER — BLOOD GLUCOSE MONITOR KIT
PACK | 0 refills | Status: AC
Start: 2021-11-24 — End: ?

## 2021-11-24 MED ORDER — GLIMEPIRIDE 1 MG PO TABS: 1.0000 mg | ORAL_TABLET | Freq: Every day | ORAL | 0 refills | Status: DC

## 2021-11-24 NOTE — Progress Notes (Signed)
Pt is waiting on oxygen to be delivered to room and education with glucometer,bedside nurse made aware.

## 2021-11-24 NOTE — TOC Transition Note (Signed)
Transition of Care [facility]) - CM/SW Discharge Note   Patient Details  Name: Marcus Carr MRN: [id number] Date of Birth: 01-04-1941  [facility] CM/SW Contact:  Carles Collet, RN Phone Number: 11/24/2021, 9:43 AM   Clinical Narrative:     Notified [facility] that patient will DC today. Ordered bedside commode to be delivered to the house, oxygen POC will be delivered to the room via [employer] (once ambulatory sats documented, nurse aware).   Final next level of care: [place] Barriers to Discharge: No Barriers Identified   Patient Goals and CMS Choice Patient states their goals for this hospitalization and ongoing recovery are:: return home CMS Medicare.gov Compare Post Acute Care list provided to:: Patient Represenative (must comment) Choice offered to / list presented to : Spouse  Discharge Placement                       Discharge Plan and Services   Discharge Planning Services: CM Consult Post Acute Care Choice: Home Health          DME Arranged: Bedside commode, Oxygen DME Agency: [employer] Date DME Agency Contacted: 11/24/21 Time DME Agency Contacted: [phone] Representative spoke with at DME Agency: [place]: PT, OT [place] Agency: [place] Date [place]: 11/24/21 Time [place]: [phone] Representative spoke with at [place]: [place] Determinants of Health ([place]) Interventions     Readmission Risk Interventions     No data to display

## 2021-11-24 NOTE — Discharge Summary (Signed)
Physician Discharge Summary  Marcus Carr GUY:[id number] DOB: Nov 19, 1940 DOA: 11/19/2021  PCP: Sharion Balloon, FNP  Admit date: 11/19/2021 Discharge date: 11/24/2021  Time spent: 37 minutes  Recommendations for Outpatient Follow-up:  Hyperglycemia found this admission will need to be monitored in the outpatient setting with glucometer and discussion with primary care--could probably come off of Amaryl low-dose which was prescribed Needs Chem-12, CBC has had severe volume depletion from COVID this admission Needs outpatient chest x-ray Might require home oxygen depending on desat screen--please reevaluate if still requires it on discharge when followed up Requires outpatient follow-up for refills of pain meds and follow-up with Dr. Ramos-consider nonpharmacological methods of pain control  Discharge Diagnoses:  MAIN problem for hospitalization   Aspiration pneumonia, COVID-pneumonia  severe AKI on admission New hyperglycemia secondary to steroids  Please see below for itemized issues addressed in HOpsital- refer to other progress notes for clarity if needed  Discharge Condition: Improved  Diet recommendation: Heart healthy diabetic  Filed Weights   11/19/21 1719 11/22/21 0500 11/23/21 0432  Weight: 127.5 kg 127.7 kg 127.5 kg    History of present illness:  40 white male known CKD 3B baseline creatinine 1.7 HTN, HLD, chronic low back pain on opioids follows with Dr. Nelva Bush HFpEF Prior mandibular osteomyelitis, prior tobacco abuse HLD It appears he does have episodic hypotension and syncope and was seen in the office 8/22 and worked up Subsequently he developed a fever and on 8/22 had a virtual visit COVID testing was done which was negative-doxycycline was called in Return to the clinic because of progressive weakness fatigue not eating drinking and was brought in by wheelchair he had intermittent dizziness as well and had cyclical fevers COVID test was negative In the  office his O2 sats were noted to be 88% repeat blood pressures 80s over 40s he was advised to go to ED but refused They were unable to get IV access could not give fluids or draw labs-he was prescribed Cipro and Zofran Patient refused transport to ED went home but then presented with increasing confusion--- found to have a new AKI-BUN/creatinine baseline about 1.7 now 66/5.5 on admit COVID-positive CXR cardiac enlargement mild perihilar airspace disease CT chest-posterior right upper lobe infiltrate with lingular basilar atelectasis UVJ stone cholelithiasis bilateral renal cysts no additional follow-up recommended    Hospital Course:  Fever without sepsis secondary to probable aspiration, COVID-positive Procalcitonin was elevated Actemra given 8/31 based on rising dimer, CRP--ultrasound lower extremities was negative for clot Cut back steroids to p.o. prednisone 40 daily,  we elected to complete a course of IV antibiotics with ceftriaxone and azithromycin on discharge and he did not require any further antibiotics subsequently Patient will need at least 2 L of oxygen at discharge and this has been ordered   AKI on admission-nonobstructive UVJ stone, multiple renal cysts FeNA indicating prerenal-DDx severe volume depletion/hypotension in the setting of diuretics Patient prior to admission on diclofenac, lisinopril 10, Lasix 40 which have all been held CT abdomen pelvis on admit noncontrast was negative for obstructive stones Ultrasound abdomen pelvis showed left-sided ureteric jet and I discussed the case with urology who felt no further work-up was required Kidney function returning to normal slowly with IV fluid repletion 50 cc/H  Needs follow-up kidney function in the outpatient setting   Impaired glucose tolerance from probable steroids for treatment of COVID/COPD Sugars as high as 300-sliding scale initiated Discussed with family use of temporary hypoglycemic agent-tolerated Amaryl 1 mg  without hypoglycemia  side effects Needs education in the outpatient setting with nursing with regards to glucometer use and twice daily checks              HFpEF On discharge resume atorvastatin, aspirin 81   HTN with hypotension on admission (now resolved)   Verapamil was gradually titrated back to home dose of 180 given hypertension Apparently was on lisinopril in the past which had been discontinued so monitor   Former tobacco?  COPD Steroids changed to prednisone 40 on 9/1 and was promptly discontinued on discharge Cessation counseling   Depression Hold Celexa 20 at this time given AKI and resume at discharge if kidney function is reasonable   Chronic low back pain Given Dilaudid because of AKI initially Now resuming home dose of Percocet 10/325 every 12  Discharge Exam: Vitals:   11/24/21 0411 11/24/21 0759  BP: (!) 160/92 (!) 170/99  Pulse: 65 68  Resp: 20 18  Temp: 98 F (36.7 C) 98.1 F (36.7 C)  SpO2: 95% 93%    Subj on day of d/c   Awake coherent no distress sitting up in the bed looks and feels stronger no chest pain no nausea Discussed with patient/family blood sugar checks Overall he is much improved  General Exam on discharge  EOMI NCAT no focal deficit No rales rhonchi wheeze Sinus, sinus tach Abdomen soft no rebound no guarding No lower extremity edema ROM intact No rash  Discharge Instructions   Discharge Instructions     Diet - low sodium heart healthy   Complete by: As directed    Discharge instructions   Complete by: As directed    This hospitalization you were diagnosed with both COVID and probably bacterial pneumonia-you have recovered pretty well from this and we will check to see if you require oxygen You did complete several days of IV antibiotics and this was felt sufficient at the time of discharge and you do not need to take any further Your sugars were elevated because we placed you on prednisone for treatment of COPD and  COVID-we will give you a limited prescription of Amaryl which is a blood sugar medication and you should not need this for over 1 or 2 weeks until your sugars come down We will also get a glucometer that you should check your sugar with prior to lunch and prior to supper and notify your physician if the sugars are above 300-they can follow-up with you and determine if there are further things that need to be done He also had dehydration which got better during hospital stay and you will need labs in the outpatient setting We will order home health for you to come out   Increase activity slowly   Complete by: As directed       Allergies as of 11/24/2021       Reactions   Penicillins Rash   Other reaction(s): Not available Did it involve swelling of the face/tongue/throat, SOB, or low BP? No Did it involve sudden or severe rash/hives, skin peeling, or any reaction on the inside of your mouth or nose? Yes Did you need to seek medical attention at a hospital or doctor's office? No When did it last happen?    Over 30 years   If all above answers are "NO", may proceed with cephalosporin use.        Medication List     STOP taking these medications    doxycycline 100 MG tablet Commonly known as: VIBRA-TABS  lisinopril 10 MG tablet Commonly known as: ZESTRIL   predniSONE 10 MG (21) Tbpk tablet Commonly known as: STERAPRED UNI-PAK 21 TAB       TAKE these medications    aspirin EC 81 MG tablet Take 81 mg by mouth daily. Swallow whole.   atorvastatin 20 MG tablet Commonly known as: LIPITOR TAKE 1 TABLET BY MOUTH EVERY DAY   blood glucose meter kit and supplies Kit Dispense based on patient and insurance preference. Use up to four times daily as directed.   cetirizine 10 MG tablet Commonly known as: ZYRTEC Take 1 tablet (10 mg total) by mouth daily.   cholecalciferol 10 MCG (400 UNIT) Tabs tablet Commonly known as: VITAMIN D3 Take 800 Units by mouth daily.   citalopram  20 MG tablet Commonly known as: CELEXA TAKE 1 TABLET BY MOUTH EVERY DAY   diclofenac sodium 1 % Gel Commonly known as: Voltaren Apply 4 g topically 4 (four) times daily. To left knee for pain   fish oil-omega-3 fatty acids 1000 MG capsule Take 1 g by mouth daily.   fluticasone 50 MCG/ACT nasal spray Commonly known as: FLONASE SPRAY 2 SPRAYS INTO EACH NOSTRIL EVERY DAY   furosemide 40 MG tablet Commonly known as: LASIX TAKE 1 TABLET BY MOUTH EVERY DAY   glimepiride 1 MG tablet Commonly known as: AMARYL Take 1 tablet (1 mg total) by mouth daily with breakfast. Start taking on: November 25, 2021   magnesium (amino acid chelate) 133 MG tablet Take 1 tablet by mouth daily.   oxyCODONE-acetaminophen 10-325 MG tablet Commonly known as: PERCOCET Take 1 tablet by mouth every 12 (twelve) hours as needed. What changed: reasons to take this   ProAir RespiClick 258 (90 Base) MCG/ACT Aepb Generic drug: Albuterol Sulfate Inhale 1-2 puffs into the lungs every 6 (six) weeks. prn What changed: Another medication with the same name was changed. Make sure you understand how and when to take each.   albuterol 108 (90 Base) MCG/ACT inhaler Commonly known as: VENTOLIN HFA INHALE 2 PUFFS INTO THE LUNGS EVERY 6 HOURS AS NEEDED FOR WHEEZE OR SHORTNESS OF BREATH What changed: See the new instructions.   verapamil 180 MG CR tablet Commonly known as: CALAN-SR TAKE 1 TABLET BY MOUTH DAILY               Durable Medical Equipment  (From admission, onward)           Start     Ordered   11/24/21 0908  DME Oxygen  Once       Question Answer Comment  Length of Need 6 Months   Mode or (Route) Nasal cannula   Liters per Minute 2   Oxygen delivery system Gas      11/24/21 0907           Allergies  Allergen Reactions   Penicillins Rash    Other reaction(s): Not available  Did it involve swelling of the face/tongue/throat, SOB, or low BP? No Did it involve sudden or severe  rash/hives, skin peeling, or any reaction on the inside of your mouth or nose? Yes Did you need to seek medical attention at a hospital or doctor's office? No When did it last happen?    Over 30 years   If all above answers are "NO", may proceed with cephalosporin use.      Follow-up Information     [facility].   Specialty: Home Health Services Why: for home health services Contact  information: [address] [place] [address] [phone]                  The results of significant diagnostics from this hospitalization (including imaging, microbiology, ancillary and laboratory) are listed below for reference.    Significant Diagnostic Studies: VAS [place] LOWER EXTREMITY VENOUS (DVT)  Result Date: 11/22/2021  Lower Venous DVT Study Patient Name:  Marcus Carr  Date of Exam:   11/22/2021 Medical Rec #: [id number]           Accession #:    [id number] Date of Birth: 05/23/1940          Patient Gender: M Patient Age:   81 years Exam Location:  [facility] Procedure:      VAS [place] LOWER EXTREMITY VENOUS (DVT) Referring Phys: Nita Sells --------------------------------------------------------------------------------  Indications: Swelling.  Comparison Study: No previous exam noted. Performing Technologist: Bobetta Lime BS, RVT  Examination Guidelines: A complete evaluation includes B-mode imaging, spectral Doppler, color Doppler, and power Doppler as needed of all accessible portions of each vessel. Bilateral testing is considered an integral part of a complete examination. Limited examinations for reoccurring indications may be performed as noted. The reflux portion of the exam is performed with the patient in reverse Trendelenburg.  +---------+---------------+---------+-----------+----------+--------------+ RIGHT    CompressibilityPhasicitySpontaneityPropertiesThrombus Aging  +---------+---------------+---------+-----------+----------+--------------+ CFV      Full           Yes      Yes                                 +---------+---------------+---------+-----------+----------+--------------+ SFJ      Full                                                        +---------+---------------+---------+-----------+----------+--------------+ FV Prox  Full                                                        +---------+---------------+---------+-----------+----------+--------------+ FV Mid   Full                                                        +---------+---------------+---------+-----------+----------+--------------+ FV DistalFull                                                        +---------+---------------+---------+-----------+----------+--------------+ PFV      Full                                                        +---------+---------------+---------+-----------+----------+--------------+ POP  Full           Yes      Yes                                 +---------+---------------+---------+-----------+----------+--------------+ PTV      Full                                                        +---------+---------------+---------+-----------+----------+--------------+ PERO     Full                                                        +---------+---------------+---------+-----------+----------+--------------+   +---------+---------------+---------+-----------+----------+--------------+ LEFT     CompressibilityPhasicitySpontaneityPropertiesThrombus Aging +---------+---------------+---------+-----------+----------+--------------+ CFV      Full           Yes      Yes                                 +---------+---------------+---------+-----------+----------+--------------+ SFJ      Full                                                         +---------+---------------+---------+-----------+----------+--------------+ FV Prox  Full                                                        +---------+---------------+---------+-----------+----------+--------------+ FV Mid   Full                                                        +---------+---------------+---------+-----------+----------+--------------+ FV DistalFull                                                        +---------+---------------+---------+-----------+----------+--------------+ PFV      Full                                                        +---------+---------------+---------+-----------+----------+--------------+ POP      Full           Yes      Yes                                 +---------+---------------+---------+-----------+----------+--------------+  PTV      Full                                                        +---------+---------------+---------+-----------+----------+--------------+ PERO     Full                                                        +---------+---------------+---------+-----------+----------+--------------+     Summary: BILATERAL: - No evidence of deep vein thrombosis seen in the lower extremities, bilaterally. -No evidence of popliteal cyst, bilaterally.   *See table(s) above for measurements and observations. Electronically signed by Servando Snare MD on 11/22/2021 at 3:10:10 PM.    Final    US RENAL  Result Date: 11/21/2021 CLINICAL DATA:  AKI EXAM: RENAL / URINARY TRACT ULTRASOUND COMPLETE COMPARISON:  CT abdomen/pelvis 2 days prior FINDINGS: Right Kidney: Renal measurements: 11.3 cm x 6.7 cm x 5.0 cm = volume: 164 mL. A simple cyst in the interpolar region measuring up to 3.8 cm is noted for which no specific imaging follow-up is required. No other masses are seen. There is no hydronephrosis. Left Kidney: Renal measurements: 12.0 cm x 5.6 cm x 5.0 cm = volume: 177 mL. Parenchymal echogenicity is  normal. There is dilation of the left collecting system which is similar to the prior study. Bladder: Appears normal for degree of bladder distention. The left ureteral jet was identified. Other: None. IMPRESSION: 1. Dilation of the left collecting system without frank hydronephrosis is similar to the prior study. Findings may reflect UPJ stricture. 2. The left ureteral jet was identified Electronically Signed   By: Valetta Mole M.D.   On: 11/21/2021 16:26   CT Chest Wo Contrast  Result Date: 11/19/2021 CLINICAL DATA:  Cough, fever and hematuria. EXAM: CT CHEST, ABDOMEN AND PELVIS WITHOUT CONTRAST TECHNIQUE: Multidetector CT imaging of the chest, abdomen and pelvis was performed following the standard protocol without IV contrast. RADIATION DOSE REDUCTION: This exam was performed according to the departmental dose-optimization program which includes automated exposure control, adjustment of the mA and/or kV according to patient size and/or use of iterative reconstruction technique. COMPARISON:  None Available. FINDINGS: CT CHEST FINDINGS Cardiovascular: There is mild to moderate severity calcification of the aortic arch and descending thoracic aorta, without evidence of aortic aneurysm. Normal heart size with marked severity coronary artery calcification. A very small pericardial effusion is seen. Mediastinum/Nodes: Subcentimeter AP window and pretracheal lymph nodes are noted. Thyroid gland, trachea, and esophagus demonstrate no significant findings. Lungs/Pleura: Mild posterior right upper lobe infiltrate is seen. Mild lingular and posterior bibasilar atelectatic changes are also noted. There is no evidence of a pleural effusion or pneumothorax. Musculoskeletal: Marked severity multilevel degenerative changes seen throughout the thoracic spine. CT ABDOMEN PELVIS FINDINGS Hepatobiliary: No focal liver abnormality is seen. Subcentimeter gallstones are seen within what appears to be in markedly contracted  gallbladder. There is no evidence of biliary dilatation. Pancreas: Unremarkable. No pancreatic ductal dilatation or surrounding inflammatory changes. Spleen: Normal in size without focal abnormality. Adrenals/Urinary Tract: Adrenal glands are unremarkable. Kidneys are normal in size. 2 mm and 3 mm nonobstructing renal calculi are seen within the lower pole  of the left kidney. A 3.7 cm diameter cyst is seen within the anterior aspect of the mid right kidney. A 2.0 cm diameter cyst is noted along the posterior aspect of the mid left kidney. A 5.0 cm x 3.5 cm parapelvic left renal cyst is noted. Adjacent 4 mm calculi are seen within a contracted urinary bladder, along the expected region of the left UVJ (axial CT image 109, CT series 3). Stomach/Bowel: There is a small hiatal hernia. The appendix is surgically absent. No evidence of bowel wall thickening, distention, or inflammatory changes. Vascular/Lymphatic: Aortic atherosclerosis. No enlarged abdominal or pelvic lymph nodes. Reproductive: Prostate is unremarkable. Other: A 4.8 cm x 2.0 cm left inguinal hernia is seen. This contains fluid and fat. No abdominopelvic ascites. Musculoskeletal: Marked severity multilevel degenerative changes seen throughout the lumbar spine. IMPRESSION: 1. Mild posterior right upper lobe infiltrate with mild lingular and posterior bibasilar atelectasis. 2. Adjacent 4 mm calculi along the left UVJ. While these may be renal in origin, small bladder calculi cannot be excluded. 3. Cholelithiasis. 4. Small hiatal hernia. 5. Bilateral renal cysts (Bosniak 2). No additional follow-up imaging is recommended. This recommendation follows ACR consensus guidelines: Management of the Incidental Renal Mass on CT: A White Paper of the ACR Incidental Findings Committee. J Am Coll Radiol [phone]. Aortic Atherosclerosis (ICD10-I70.0). Electronically Signed   By: Virgina Norfolk M.D.   On: 11/19/2021 20:53   CT ABDOMEN PELVIS WO  CONTRAST  Result Date: 11/19/2021 CLINICAL DATA:  Cough, fever and hematuria. EXAM: CT CHEST, ABDOMEN AND PELVIS WITHOUT CONTRAST TECHNIQUE: Multidetector CT imaging of the chest, abdomen and pelvis was performed following the standard protocol without IV contrast. RADIATION DOSE REDUCTION: This exam was performed according to the departmental dose-optimization program which includes automated exposure control, adjustment of the mA and/or kV according to patient size and/or use of iterative reconstruction technique. COMPARISON:  None Available. FINDINGS: CT CHEST FINDINGS Cardiovascular: There is mild to moderate severity calcification of the aortic arch and descending thoracic aorta, without evidence of aortic aneurysm. Normal heart size with marked severity coronary artery calcification. A very small pericardial effusion is seen. Mediastinum/Nodes: Subcentimeter AP window and pretracheal lymph nodes are noted. Thyroid gland, trachea, and esophagus demonstrate no significant findings. Lungs/Pleura: Mild posterior right upper lobe infiltrate is seen. Mild lingular and posterior bibasilar atelectatic changes are also noted. There is no evidence of a pleural effusion or pneumothorax. Musculoskeletal: Marked severity multilevel degenerative changes seen throughout the thoracic spine. CT ABDOMEN PELVIS FINDINGS Hepatobiliary: No focal liver abnormality is seen. Subcentimeter gallstones are seen within what appears to be in markedly contracted gallbladder. There is no evidence of biliary dilatation. Pancreas: Unremarkable. No pancreatic ductal dilatation or surrounding inflammatory changes. Spleen: Normal in size without focal abnormality. Adrenals/Urinary Tract: Adrenal glands are unremarkable. Kidneys are normal in size. 2 mm and 3 mm nonobstructing renal calculi are seen within the lower pole of the left kidney. A 3.7 cm diameter cyst is seen within the anterior aspect of the mid right kidney. A 2.0 cm diameter cyst  is noted along the posterior aspect of the mid left kidney. A 5.0 cm x 3.5 cm parapelvic left renal cyst is noted. Adjacent 4 mm calculi are seen within a contracted urinary bladder, along the expected region of the left UVJ (axial CT image 109, CT series 3). Stomach/Bowel: There is a small hiatal hernia. The appendix is surgically absent. No evidence of bowel wall thickening, distention, or inflammatory changes. Vascular/Lymphatic: Aortic atherosclerosis. No enlarged  abdominal or pelvic lymph nodes. Reproductive: Prostate is unremarkable. Other: A 4.8 cm x 2.0 cm left inguinal hernia is seen. This contains fluid and fat. No abdominopelvic ascites. Musculoskeletal: Marked severity multilevel degenerative changes seen throughout the lumbar spine. IMPRESSION: 1. Mild posterior right upper lobe infiltrate with mild lingular and posterior bibasilar atelectasis. 2. Adjacent 4 mm calculi along the left UVJ. While these may be renal in origin, small bladder calculi cannot be excluded. 3. Cholelithiasis. 4. Small hiatal hernia. 5. Bilateral renal cysts (Bosniak 2). No additional follow-up imaging is recommended. This recommendation follows ACR consensus guidelines: Management of the Incidental Renal Mass on CT: A White Paper of the ACR Incidental Findings Committee. J Am Coll Radiol [id number]. Aortic Atherosclerosis (ICD10-I70.0). Electronically Signed   By: Virgina Norfolk M.D.   On: 11/19/2021 20:53   DG Chest 2 View  Result Date: 11/19/2021 CLINICAL DATA:  Suspected sepsis EXAM: CHEST - 2 VIEW COMPARISON:  Chest 11/18/2021 FINDINGS: Cardiac enlargement without heart failure. Mild right perihilar airspace disease could represent pneumonia. Mild right lower lobe atelectasis with elevated right hemidiaphragm. Mild left lower lobe atelectasis. No effusion. IMPRESSION: Question early right perihilar infiltrate. Right lower lobe atelectasis also present. Electronically Signed   By: Franchot Gallo M.D.   On:  11/19/2021 18:15    Microbiology: Recent Results (from the past 240 hour(s))  Culture, blood (Routine x 2)     Status: None (Preliminary result)   Collection Time: 11/19/21  5:40 PM   Specimen: BLOOD  Result Value Ref Range Status   Specimen Description BLOOD SITE NOT SPECIFIED  Final   Special Requests   Final    BOTTLES DRAWN AEROBIC AND ANAEROBIC Blood Culture adequate volume   Culture   Final    NO GROWTH 4 DAYS Performed at [facility], [address], [place], [place] [address]    Report Status PENDING  Incomplete  SARS Coronavirus 2 by RT PCR (hospital order, performed in [facility] hospital lab) *cepheid single result test*     Status: Abnormal   Collection Time: 11/19/21  6:49 PM   Specimen: Nasal Swab  Result Value Ref Range Status   SARS Coronavirus 2 by RT PCR POSITIVE (A) NEGATIVE Final    Comment: (NOTE) SARS-CoV-2 target nucleic acids are DETECTED  SARS-CoV-2 RNA is generally detectable in upper respiratory specimens  during the acute phase of infection.  Positive results are indicative  of the presence of the identified virus, but do not rule out bacterial infection or co-infection with other pathogens not detected by the test.  Clinical correlation with patient history and  other diagnostic information is necessary to determine patient infection status.  The expected result is negative.  Fact Sheet for Patients:   https://www.patel.info/   Fact Sheet for Healthcare Providers:   https://hall.com/    This test is not yet approved or cleared by the [place] FDA and  has been authorized for detection and/or diagnosis of SARS-CoV-2 by FDA under an Emergency Use Authorization (EUA).  This EUA will remain in effect (meaning this test can be used) for the duration of  the COVID-19 declaration under Section 564(b)(1)  of the Act, 21 U.S.C. section 360-bbb-3(b)(1), unless the authorization is terminated or  revoked sooner.   Performed at [facility], [place] [address], [place], [place] [address]   Culture, blood (Routine x 2)     Status: None (Preliminary result)   Collection Time: 11/19/21  8:08 PM   Specimen:  BLOOD  Result Value Ref Range Status   Specimen Description BLOOD RIGHT ANTECUBITAL  Final   Special Requests   Final    BOTTLES DRAWN AEROBIC AND ANAEROBIC Blood Culture adequate volume   Culture   Final    NO GROWTH 4 DAYS Performed at [facility], [address] [place], [place] [address]    Report Status PENDING  Incomplete     Labs: Basic Metabolic Panel: Recent Labs  Lab 11/19/21 1740 11/20/21 0148 11/20/21 0823 11/21/21 0434 11/22/21 0801 11/23/21 0559  NA 135 139 137 137 138 142  K 4.1 3.5 4.2 4.5 4.2 4.2  CL 100 105  --  106 108 112*  CO2 22 20*  --  21* 20* 23  GLUCOSE 135* 105*  --  207* 324* 166*  BUN 66* 62*  --  77* 71* 65*  CREATININE 5.54* 4.86*  --  5.61* 3.73* 2.52*  CALCIUM 8.5* 6.7*  --  8.2* 8.3* 8.8*  MG  --  1.5*  --   --   --   --   PHOS  --  4.1  --   --   --   --    Liver Function Tests: Recent Labs  Lab 11/19/21 1740 11/20/21 0148 11/21/21 0434 11/22/21 0801 11/23/21 0559  AST $Re'22 17 15 16 26  'vAD$ ALT $R'24 18 19 19 29  'gH$ ALKPHOS 52 38 52 69 58  BILITOT 0.9 0.7 0.5 0.3 0.5  PROT 6.0* 4.7* 5.7* 5.4* 5.2*  ALBUMIN 3.0* 2.2* 2.5* 2.5* 2.5*   No results for input(s): "LIPASE", "AMYLASE" in the last 168 hours. No results for input(s): "AMMONIA" in the last 168 hours. CBC: Recent Labs  Lab 11/19/21 1740 11/20/21 0148 11/20/21 0823 11/21/21 0434 11/22/21 0801 11/23/21 0559  WBC 8.6 6.0  --  3.5* 5.4 7.7  NEUTROABS 7.3 5.1  --   --   --   --   HGB 12.5* 10.4* 10.5* 11.4* 10.9* 11.8*  HCT 37.9* 32.8* 31.0* 34.6* 33.0* 33.5*  MCV 90.9 92.9  --  89.2 88.9 85.9  PLT 139* 103*  --  122* 135* 143*   Cardiac Enzymes: No results for input(s): "CKTOTAL", "CKMB", "CKMBINDEX", "TROPONINI" in the last 168 hours. BNP: BNP (last  3 results) Recent Labs    03/29/21 1625 11/20/21 0148  BNP 167.5* 45.9    ProBNP (last 3 results) No results for input(s): "PROBNP" in the last 8760 hours.  CBG: Recent Labs  Lab 11/23/21 0831 11/23/21 1229 11/23/21 1636 11/23/21 2032 11/24/21 0807  GLUCAP 151* 142* 137* 180* 92       Signed:  Nita Sells MD   [facility] 11/24/2021, 9:07 AM

## 2021-11-24 NOTE — Plan of Care (Addendum)
Discharge instructions discussed with patient.  Patient instructed on home medications, restrictions, and follow up appointments. Belongings gathered and sent with patient.  Patients medications to be picked up at [facility] in [place].

## 2021-11-24 NOTE — Progress Notes (Signed)
Patient and spouse educated on the use of the use of a glucometer and the oxygen supplies brought in by vendor for take home use. Teachback demonstrated.

## 2021-11-24 NOTE — Progress Notes (Signed)
Occupational Therapy Treatment Patient Details Name: Marcus Carr MRN: [id number] DOB: 09/03/1940 Today's Date: 11/24/2021   History of present illness Pt is a 81 year old man admitted on 8/29 with CAP, COVID positive. PMH: COPDm CHF, CKD, HLD, low back pain, mandibular osteomyelitis, prior tobacco use, depression.   OT comments  Patient in supine with O2 off and SpO2 at 93. O2 reapplied at 2 liters and increased to 95. Patient was able to get to EOB with min guard assist.  Patient performed performed mobility to sink and back to bed, ~10 feet on RA and O2 dropped to 88 and was able to return to 93-94 following 45 seconds. Patient ambualted ~15 feet to recliner on RA and O2 dropped to 88 and required 2 minutes to return to 93. Patient performed static standing for 1.5 with SpO2 at 93.  Patient required frequent rest breaks and quickly fatigues with mobility on RA. Patient is expected to return home with oxygen.    Recommendations for follow up therapy are one component of a multi-disciplinary discharge planning process, led by the attending physician.  Recommendations may be updated based on patient status, additional functional criteria and insurance authorization.    Follow Up Recommendations  Home health OT    Assistance Recommended at Discharge Intermittent Supervision/Assistance  Patient can return home with the following  A little help with walking and/or transfers;A lot of help with bathing/dressing/bathroom;Assistance with cooking/housework;Direct supervision/assist for medications management;Direct supervision/assist for financial management;Assist for transportation;Help with stairs or ramp for entrance   Equipment Recommendations  Tub/shower bench    Recommendations for Other Services      Precautions / Restrictions Precautions Precautions: Fall Restrictions Weight Bearing Restrictions: No       Mobility Bed Mobility Overal bed mobility: Needs Assistance Bed  Mobility: Supine to Sit     Supine to sit: Min guard, HOB elevated     General bed mobility comments: increased time    Transfers Overall transfer level: Needs assistance Equipment used: Rolling walker (2 wheels) Transfers: Sit to/from Stand Sit to Stand: Min assist           General transfer comment: min assist to power up and min guard while standing     Balance Overall balance assessment: Needs assistance Sitting-balance support: Feet supported, No upper extremity supported Sitting balance-Leahy Scale: Good     Standing balance support: Bilateral upper extremity supported, During functional activity Standing balance-Leahy Scale: Poor Standing balance comment: reliant on RW                           ADL either performed or assessed with clinical judgement   ADL Overall ADL's : Needs assistance/impaired                                       General ADL Comments: focused on mobility on RA    Extremity/Trunk Assessment              Vision       Perception     Praxis      Cognition Arousal/Alertness: Awake/alert Behavior During Therapy: WFL for tasks assessed/performed Overall Cognitive Status: Within Functional Limits for tasks assessed  General Comments: slow processing        Exercises      Shoulder Instructions       General Comments SpO2 96 on 2 liters. Able to maintain 93 on RA at rest and drops to 88 with mobility    Pertinent Vitals/ Pain       Pain Assessment Pain Assessment: Faces Faces Pain Scale: Hurts little more Pain Location: bilat knees, chronic in nature Pain Descriptors / Indicators: Sore, Discomfort Pain Intervention(s): Limited activity within patient's tolerance, Monitored during session, Repositioned  Home Living                                          Prior Functioning/Environment              Frequency  Min 2X/week         Progress Toward Goals  OT Goals(current goals can now be found in the care plan section)  Progress towards OT goals: Progressing toward goals  Acute Rehab OT Goals OT Goal Formulation: With patient Time For Goal Achievement: 12/05/21 Potential to Achieve Goals: Good ADL Goals Pt Will Perform Grooming: standing;with supervision Pt Will Perform Lower Body Bathing: with supervision;sit to/from stand;with adaptive equipment Pt Will Perform Lower Body Dressing: with supervision;with adaptive equipment;sit to/from stand Pt Will Transfer to Toilet: with supervision;ambulating;regular height toilet Pt Will Perform Toileting - Clothing Manipulation and hygiene: with supervision;sit to/from stand Pt Will Perform Tub/Shower Transfer: Tub transfer;with supervision;ambulating;rolling walker;tub bench Additional ADL Goal #1: Pt will employ energy conservation strategies in ADLs and mobility as instructed.  Plan Discharge plan remains appropriate    Co-evaluation                 AM-PAC OT "6 Clicks" Daily Activity     Outcome Measure   Help from another person eating meals?: None Help from another person taking care of personal grooming?: A Little Help from another person toileting, which includes using toliet, bedpan, or urinal?: A Little Help from another person bathing (including washing, rinsing, drying)?: A Little Help from another person to put on and taking off regular upper body clothing?: A Little Help from another person to put on and taking off regular lower body clothing?: A Lot 6 Click Score: 18    End of Session Equipment Utilized During Treatment: Gait belt;Rolling walker (2 wheels);Oxygen  OT Visit Diagnosis: Unsteadiness on feet (R26.81);Other abnormalities of gait and mobility (R26.89);Muscle weakness (generalized) (M62.81);Pain   Activity Tolerance Patient tolerated treatment well   Patient Left in chair;with call bell/phone within reach;with chair  alarm set;with family/visitor present   Nurse Communication Mobility status;Other (comment) (SpO2 with mobility)        Time: 9983-3825 OT Time Calculation (min): 35 min  Charges: OT General Charges $OT Visit: 1 Visit OT Treatments $Therapeutic Activity: 23-37 mins  Lodema Hong, [place]  Office [place] 11/24/2021, 12:41 PM

## 2021-11-26 DIAGNOSIS — I5032 Chronic diastolic (congestive) heart failure: Secondary | ICD-10-CM | POA: Diagnosis not present

## 2021-11-26 DIAGNOSIS — D649 Anemia, unspecified: Secondary | ICD-10-CM | POA: Diagnosis not present

## 2021-11-26 DIAGNOSIS — U071 COVID-19: Secondary | ICD-10-CM | POA: Diagnosis not present

## 2021-11-26 DIAGNOSIS — R531 Weakness: Secondary | ICD-10-CM | POA: Diagnosis not present

## 2021-11-26 DIAGNOSIS — N1832 Chronic kidney disease, stage 3b: Secondary | ICD-10-CM | POA: Diagnosis not present

## 2021-11-26 DIAGNOSIS — J9601 Acute respiratory failure with hypoxia: Secondary | ICD-10-CM | POA: Diagnosis not present

## 2021-11-26 DIAGNOSIS — G8929 Other chronic pain: Secondary | ICD-10-CM | POA: Diagnosis not present

## 2021-11-26 DIAGNOSIS — J309 Allergic rhinitis, unspecified: Secondary | ICD-10-CM | POA: Diagnosis not present

## 2021-11-26 DIAGNOSIS — N179 Acute kidney failure, unspecified: Secondary | ICD-10-CM | POA: Diagnosis not present

## 2021-11-26 DIAGNOSIS — J441 Chronic obstructive pulmonary disease with (acute) exacerbation: Secondary | ICD-10-CM | POA: Diagnosis not present

## 2021-11-27 ENCOUNTER — Telehealth: Payer: Self-pay | Admitting: Family

## 2021-11-27 ENCOUNTER — Telehealth: Payer: Self-pay

## 2021-11-27 DIAGNOSIS — J309 Allergic rhinitis, unspecified: Secondary | ICD-10-CM | POA: Diagnosis not present

## 2021-11-27 DIAGNOSIS — N1832 Chronic kidney disease, stage 3b: Secondary | ICD-10-CM | POA: Diagnosis not present

## 2021-11-27 DIAGNOSIS — J9601 Acute respiratory failure with hypoxia: Secondary | ICD-10-CM | POA: Diagnosis not present

## 2021-11-27 DIAGNOSIS — G8929 Other chronic pain: Secondary | ICD-10-CM | POA: Diagnosis not present

## 2021-11-27 DIAGNOSIS — I5032 Chronic diastolic (congestive) heart failure: Secondary | ICD-10-CM | POA: Diagnosis not present

## 2021-11-27 DIAGNOSIS — R531 Weakness: Secondary | ICD-10-CM | POA: Diagnosis not present

## 2021-11-27 DIAGNOSIS — J441 Chronic obstructive pulmonary disease with (acute) exacerbation: Secondary | ICD-10-CM | POA: Diagnosis not present

## 2021-11-27 DIAGNOSIS — D649 Anemia, unspecified: Secondary | ICD-10-CM | POA: Diagnosis not present

## 2021-11-27 DIAGNOSIS — N179 Acute kidney failure, unspecified: Secondary | ICD-10-CM | POA: Diagnosis not present

## 2021-11-27 DIAGNOSIS — U071 COVID-19: Secondary | ICD-10-CM | POA: Diagnosis not present

## 2021-11-27 NOTE — Telephone Encounter (Signed)
Appointment already made for 9/12, 4:05pm, [place]

## 2021-11-27 NOTE — Telephone Encounter (Signed)
Transition Care Management Follow-up Telephone Call Date of discharge and from where: [facility] 11-24-21 Dx: Covid Pneumonia How have you been since you were released from the hospital? Doing good  Any questions or concerns? No  Items Reviewed: Did the pt receive and understand the discharge instructions provided? Yes  Medications obtained and verified? Yes  Other? No  Any new allergies since your discharge? No  Dietary orders reviewed? Yes Do you have support at home? Yes   Home Care and Equipment/Supplies: Were home health services ordered? yes If so, what is the name of the agency? Unsure of name   Has the agency set up a time to come to the patient's home? yes Were any new equipment or medical supplies ordered?  No What is the name of the medical supply agency? na Were you able to get the supplies/equipment? not applicable Do you have any questions related to the use of the equipment or supplies? No  Functional Questionnaire: (I = Independent and D = Dependent) ADLs: I  Bathing/Dressing- I  Meal Prep- I  Eating- I  Maintaining continence- I  Transferring/Ambulation- I  Managing Meds- I  Follow up appointments reviewed:  PCP Hospital f/u appt confirmed? Yes  [facility]uled [facility] No . Are transportation arrangements needed? No  If their condition worsens, is the pt aware to call PCP or go to the Emergency Dept.? Yes Was the patient provided with contact information for the PCP's office or ED? Yes Was to pt encouraged to call back with questions or concerns? Yes

## 2021-11-30 DIAGNOSIS — N179 Acute kidney failure, unspecified: Secondary | ICD-10-CM | POA: Diagnosis not present

## 2021-11-30 DIAGNOSIS — I5032 Chronic diastolic (congestive) heart failure: Secondary | ICD-10-CM | POA: Diagnosis not present

## 2021-11-30 DIAGNOSIS — R531 Weakness: Secondary | ICD-10-CM | POA: Diagnosis not present

## 2021-11-30 DIAGNOSIS — J309 Allergic rhinitis, unspecified: Secondary | ICD-10-CM | POA: Diagnosis not present

## 2021-11-30 DIAGNOSIS — U071 COVID-19: Secondary | ICD-10-CM | POA: Diagnosis not present

## 2021-11-30 DIAGNOSIS — J9601 Acute respiratory failure with hypoxia: Secondary | ICD-10-CM | POA: Diagnosis not present

## 2021-11-30 DIAGNOSIS — G8929 Other chronic pain: Secondary | ICD-10-CM | POA: Diagnosis not present

## 2021-11-30 DIAGNOSIS — D649 Anemia, unspecified: Secondary | ICD-10-CM | POA: Diagnosis not present

## 2021-11-30 DIAGNOSIS — J441 Chronic obstructive pulmonary disease with (acute) exacerbation: Secondary | ICD-10-CM | POA: Diagnosis not present

## 2021-11-30 DIAGNOSIS — N1832 Chronic kidney disease, stage 3b: Secondary | ICD-10-CM | POA: Diagnosis not present

## 2021-12-03 ENCOUNTER — Encounter: Payer: Self-pay | Admitting: Family Medicine

## 2021-12-03 ENCOUNTER — Other Ambulatory Visit: Payer: Self-pay | Admitting: Family

## 2021-12-03 ENCOUNTER — Ambulatory Visit (INDEPENDENT_AMBULATORY_CARE_PROVIDER_SITE_OTHER): Payer: Medicare Other

## 2021-12-03 ENCOUNTER — Ambulatory Visit (INDEPENDENT_AMBULATORY_CARE_PROVIDER_SITE_OTHER): Payer: Medicare Other | Admitting: Family Medicine

## 2021-12-03 VITALS — BP 118/69 | HR 74 | Temp 97.9°F | Ht 68.0 in | Wt 281.0 lb

## 2021-12-03 DIAGNOSIS — J1282 Pneumonia due to coronavirus disease 2019: Secondary | ICD-10-CM

## 2021-12-03 DIAGNOSIS — D649 Anemia, unspecified: Secondary | ICD-10-CM

## 2021-12-03 DIAGNOSIS — J441 Chronic obstructive pulmonary disease with (acute) exacerbation: Secondary | ICD-10-CM | POA: Diagnosis not present

## 2021-12-03 DIAGNOSIS — R739 Hyperglycemia, unspecified: Secondary | ICD-10-CM | POA: Diagnosis not present

## 2021-12-03 DIAGNOSIS — Z7689 Persons encountering health services in other specified circumstances: Secondary | ICD-10-CM

## 2021-12-03 DIAGNOSIS — N179 Acute kidney failure, unspecified: Secondary | ICD-10-CM

## 2021-12-03 DIAGNOSIS — R531 Weakness: Secondary | ICD-10-CM

## 2021-12-03 DIAGNOSIS — U071 COVID-19: Secondary | ICD-10-CM | POA: Diagnosis not present

## 2021-12-03 DIAGNOSIS — J9601 Acute respiratory failure with hypoxia: Secondary | ICD-10-CM | POA: Diagnosis not present

## 2021-12-03 DIAGNOSIS — M7989 Other specified soft tissue disorders: Secondary | ICD-10-CM

## 2021-12-03 DIAGNOSIS — E785 Hyperlipidemia, unspecified: Secondary | ICD-10-CM

## 2021-12-03 DIAGNOSIS — E8881 Metabolic syndrome: Secondary | ICD-10-CM

## 2021-12-03 DIAGNOSIS — I5032 Chronic diastolic (congestive) heart failure: Secondary | ICD-10-CM

## 2021-12-03 DIAGNOSIS — I10 Essential (primary) hypertension: Secondary | ICD-10-CM

## 2021-12-03 DIAGNOSIS — R2243 Localized swelling, mass and lump, lower limb, bilateral: Secondary | ICD-10-CM

## 2021-12-03 DIAGNOSIS — T380X5A Adverse effect of glucocorticoids and synthetic analogues, initial encounter: Secondary | ICD-10-CM

## 2021-12-03 DIAGNOSIS — J189 Pneumonia, unspecified organism: Secondary | ICD-10-CM

## 2021-12-03 DIAGNOSIS — J9811 Atelectasis: Secondary | ICD-10-CM | POA: Diagnosis not present

## 2021-12-03 NOTE — Patient Instructions (Signed)

## 2021-12-03 NOTE — Progress Notes (Signed)
Subjective:  Patient ID: Marcus Carr, male    DOB: February 28, 1941, 81 y.o.   MRN: [id number]  Patient Care Team: Sharion Balloon, FNP as PCP - General (Nurse Practitioner) Arnetha Courser (Inactive) as Surgeon Verlin Grills, MD as Attending Physician (Infectious Diseases)   Chief Complaint:  Hospitalization Follow-up   HPI: Marcus Carr is a 81 y.o. male presenting on 12/03/2021 for Hospitalization Follow-up   Pt presents today for hospital discharge follow up. The patient was discharged from [facility] on 11/24/2021 with a primary diagnosis of COVID-19 infection, CAP, acute hypoxic respiratory failure, AKI, COPD exacerbation, acute anemia, hyperglycemia, generalized weakness, and chronic diastolic heart failure.  Contact with the patient and/or caregiver, by a clinical staff member, was made on 11/27/2021 and was documented as a telephone encounter within the EMR.  He presented to the ED with complaints of general weakness for 2 weeks. 10 days prior to ED visit pt reported rhinorrhea, rhinitis, intermittent fever, and decreased appetite which was improving daily. States about 4 days prior to ED visit he developed productive cough, malaise, and mild shortness of breath. He was noted to be hypoxic in the 80s , improved to mid 90s on supplemental oxygen which he had to continue upon discharge home due to continued low oxygen saturations. Labs upon admission revealed creatinine of 5.61, Hgb 11.4, Hct 34.6, and glucose of 324. Discharge labs revealed creatinine 2.29, Hgb 11.8, Hct 33.5, and glucose 99. CT chest revealed right upper lobe pneumonia. He received IV Rocephin and Zithromax during hospital stay. A1C noted to be 6.9 which was attributed to steroid use. He was discharged home on Amaryl and has been doing well. Blood sugars running in the low to mid 100 range at home. No polyuria, polydipsia, or polyphagia.  He states he has been improving daily since discharge home.  Gradually getting his strength back and sleeping better at night with oxygen use. States he is eating and drinking better since discharge. No cough or shortness of breath. Does have some lower extremity swelling.   Through chart review and discussion with the patient I have determined that management of their condition is of high complexity.    Relevant past medical, surgical, family, and social history reviewed and updated as indicated.  Allergies and medications reviewed and updated. Data reviewed: Chart in Epic.   Past Medical History:  Diagnosis Date   Abscess, jaw    Anxiety    Back pain    chronic back pain treated by Dr. Nelva Bush   Chronic kidney disease    Chronic mastoiditis of both sides 11/07/2014   Cigarette smoker 11/07/2014   COPD (chronic obstructive pulmonary disease) ([place])    Gout    Hyperlipidemia    Hypertension    Morbid obesity ([place]) 11/07/2014   Osteomyelitis of mandible 11/07/2014   Thyroid nodule 11/07/2014    Past Surgical History:  Procedure Laterality Date   APPENDECTOMY     TONSILLECTOMY      Social History   Socioeconomic History   Marital status: Married    Spouse name: Rosa   Number of children: 2   Years of education: 8   Highest education level: 8th grade  Occupational History   Occupation: Retired  Tobacco Use   Smoking status: Former    Packs/day: 1.50    Years: 50.00    Total pack years: 75.00    Types: Cigarettes    Quit date: 10/24/2014    Years since quitting:  7.1   Smokeless tobacco: Never  Vaping Use   Vaping Use: Never used  Substance and Sexual Activity   Alcohol use: No    Alcohol/week: 0.0 standard drinks of alcohol   Drug use: No   Sexual activity: Not on file  Other Topics Concern   Not on file  Social History Narrative   Lives wife and grandson stays with them a lot.  Two daughters - both live within 86 miles   Social Determinants of Health   Financial Resource Strain: Low Risk  (11/15/2020)   Overall  Financial Resource Strain (CARDIA)    Difficulty of Paying Living Expenses: Not hard at all  Food Insecurity: No Food Insecurity (11/15/2020)   Hunger Vital Sign    Worried About Running Out of Food in the Last Year: Never true    [place] in the Last Year: Never true  Transportation Needs: No Transportation Needs (11/15/2020)   PRAPARE - Hydrologist (Medical): No    Lack of Transportation (Non-Medical): No  Physical Activity: Sufficiently Active (11/15/2020)   Exercise Vital Sign    Days of Exercise per Week: 5 days    Minutes of Exercise per Session: 30 min  Stress: No Stress Concern Present (11/15/2020)   [place]    Feeling of Stress : Not at all  Social Connections: Moderately Isolated (11/15/2020)   Social Connection and Isolation Panel [NHANES]    Frequency of Communication with Friends and Family: More than three times a week    Frequency of Social Gatherings with Friends and Family: More than three times a week    Attends Religious Services: Never    Marine scientist or Organizations: No    Attends Archivist Meetings: Never    Marital Status: Married  Human resources officer Violence: Not At Risk (11/15/2020)   Humiliation, Afraid, Rape, and Kick questionnaire    Fear of Current or Ex-Partner: No    Emotionally Abused: No    Physically Abused: No    Sexually Abused: No    Outpatient Encounter Medications as of 12/03/2021  Medication Sig   albuterol (VENTOLIN HFA) 108 (90 Base) MCG/ACT inhaler INHALE 2 PUFFS INTO THE LUNGS EVERY 6 HOURS AS NEEDED FOR WHEEZE OR SHORTNESS OF BREATH (Patient taking differently: Inhale 2 puffs into the lungs every 6 (six) hours as needed for wheezing or shortness of breath. INHALE 2 PUFFS INTO THE LUNGS EVERY 6 HOURS AS NEEDED FOR WHEEZE OR SHORTNESS OF BREATH)   Albuterol Sulfate (PROAIR RESPICLICK) 301 (90 Base) MCG/ACT AEPB Inhale 1-2  puffs into the lungs every 6 (six) weeks. prn   aspirin EC 81 MG tablet Take 81 mg by mouth daily. Swallow whole.   atorvastatin (LIPITOR) 20 MG tablet TAKE 1 TABLET BY MOUTH EVERY DAY   blood glucose meter kit and supplies KIT Dispense based on patient and insurance preference. Use up to four times daily as directed.   cetirizine (ZYRTEC) 10 MG tablet Take 1 tablet (10 mg total) by mouth daily.   cholecalciferol (VITAMIN D) 400 UNITS TABS tablet Take 800 Units by mouth daily.   citalopram (CELEXA) 20 MG tablet TAKE 1 TABLET BY MOUTH EVERY DAY   diclofenac sodium (VOLTAREN) 1 % GEL Apply 4 g topically 4 (four) times daily. To left knee for pain   fish oil-omega-3 fatty acids 1000 MG capsule Take 1 g by mouth daily.   fluticasone (  FLONASE) 50 MCG/ACT nasal spray SPRAY 2 SPRAYS INTO EACH NOSTRIL EVERY DAY   furosemide (LASIX) 40 MG tablet TAKE 1 TABLET BY MOUTH EVERY DAY   glimepiride (AMARYL) 1 MG tablet Take 1 tablet (1 mg total) by mouth daily with breakfast.   oxyCODONE-acetaminophen (PERCOCET) 10-325 MG per tablet Take 1 tablet by mouth every 12 (twelve) hours as needed. (Patient taking differently: Take 1 tablet by mouth every 12 (twelve) hours as needed for pain.)   Specialty Vitamins Products (MAGNESIUM, AMINO ACID CHELATE,) 133 MG tablet Take 1 tablet by mouth daily.   verapamil (CALAN-SR) 180 MG CR tablet TAKE 1 TABLET BY MOUTH DAILY   [DISCONTINUED] atorvastatin (LIPITOR) 20 MG tablet TAKE 1 TABLET BY MOUTH EVERY DAY   [DISCONTINUED] furosemide (LASIX) 40 MG tablet TAKE 1 TABLET BY MOUTH EVERY DAY   No facility-administered encounter medications on file as of 12/03/2021.    Allergies  Allergen Reactions   Penicillins Rash    Other reaction(s): Not available  Did it involve swelling of the face/tongue/throat, SOB, or low BP? No Did it involve sudden or severe rash/hives, skin peeling, or any reaction on the inside of your mouth or nose? Yes Did you need to seek medical attention at  a hospital or doctor's office? No When did it last happen?    Over 30 years   If all above answers are "NO", may proceed with cephalosporin use.      Review of Systems  Constitutional:  Positive for activity change, appetite change and fatigue. Negative for chills, diaphoresis, fever and unexpected weight change.  Eyes:  Negative for photophobia and visual disturbance.  Respiratory:  Positive for shortness of breath. Negative for apnea, cough, choking, chest tightness, wheezing and stridor.   Cardiovascular:  Positive for leg swelling. Negative for chest pain and palpitations.  Gastrointestinal:  Negative for abdominal pain, diarrhea, nausea and vomiting.  Genitourinary:  Negative for decreased urine volume and difficulty urinating.  Musculoskeletal:  Positive for arthralgias, back pain and gait problem. Negative for joint swelling, myalgias, neck pain and neck stiffness.  Skin:  Negative for color change and rash.  Neurological:  Positive for weakness. Negative for dizziness, tremors, seizures, syncope, facial asymmetry, speech difficulty, light-headedness, numbness and headaches.  Psychiatric/Behavioral:  Negative for confusion.   All other systems reviewed and are negative.       Objective:  BP 118/69   Pulse 74   Temp 97.9 F (36.6 C)   Ht $R'5\' 8"'KQ$  (1.727 m)   Wt 281 lb (127.5 kg)   SpO2 95%   BMI 42.73 kg/m    Wt Readings from Last 3 Encounters:  12/03/21 281 lb (127.5 kg)  11/23/21 281 lb 1.4 oz (127.5 kg)  10/25/21 281 lb 6.4 oz (127.6 kg)    Physical Exam Vitals and nursing note reviewed.  Constitutional:      General: He is not in acute distress.    Appearance: He is obese. He is ill-appearing (chronically ill). He is not toxic-appearing or diaphoretic.  HENT:     Head: Normocephalic and atraumatic.     Nose: Nose normal.     Mouth/Throat:     Mouth: Mucous membranes are moist.     Pharynx: Oropharynx is clear.  Eyes:     Conjunctiva/sclera: Conjunctivae  normal.     Pupils: Pupils are equal, round, and reactive to light.  Cardiovascular:     Rate and Rhythm: Normal rate and regular rhythm.     Heart sounds: Normal  heart sounds. No murmur heard.    No friction rub. No gallop.  Abdominal:     General: Bowel sounds are normal.     Palpations: Abdomen is soft.     Tenderness: There is no abdominal tenderness.  Musculoskeletal:     Cervical back: Normal range of motion and neck supple.     Right lower leg: 1+ Edema present.     Left lower leg: 1+ Edema present.  Skin:    General: Skin is warm and dry.     Capillary Refill: Capillary refill takes less than 2 seconds.  Neurological:     General: No focal deficit present.     Mental Status: He is alert and oriented to person, place, and time.     Motor: Weakness (generalized) present.     Gait: Gait abnormal (in wheelchair).  Psychiatric:        Mood and Affect: Mood normal.        Behavior: Behavior normal.        Thought Content: Thought content normal.        Judgment: Judgment normal.     Results for orders placed or performed during the hospital encounter of 11/19/21  Culture, blood (Routine x 2)   Specimen: BLOOD  Result Value Ref Range   Specimen Description BLOOD SITE NOT SPECIFIED    Special Requests      BOTTLES DRAWN AEROBIC AND ANAEROBIC Blood Culture adequate volume   Culture      NO GROWTH 5 DAYS Performed at [facility] [address] [place], [place] [address]    Report Status 11/24/2021 FINAL   Culture, blood (Routine x 2)   Specimen: BLOOD  Result Value Ref Range   Specimen Description BLOOD RIGHT ANTECUBITAL    Special Requests      BOTTLES DRAWN AEROBIC AND ANAEROBIC Blood Culture adequate volume   Culture      NO GROWTH 5 DAYS Performed at [facility], [place] [address], [place], [place] [address]    Report Status 11/24/2021 FINAL   SARS Coronavirus 2 by RT PCR (hospital order, performed in [facility] hospital lab) *cepheid single result  test*   Specimen: Nasal Swab  Result Value Ref Range   SARS Coronavirus 2 by RT PCR POSITIVE (A) NEGATIVE  Comprehensive metabolic panel  Result Value Ref Range   Sodium 135 135 - 145 mmol/L   Potassium 4.1 3.5 - 5.1 mmol/L   Chloride 100 98 - 111 mmol/L   CO2 22 22 - 32 mmol/L   Glucose, Bld 135 (H) 70 - 99 mg/dL   BUN 66 (H) 8 - 23 mg/dL   Creatinine, Ser 5.54 (H) 0.61 - 1.24 mg/dL   Calcium 8.5 (L) 8.9 - 10.3 mg/dL   Total Protein 6.0 (L) 6.5 - 8.1 g/dL   Albumin 3.0 (L) 3.5 - 5.0 g/dL   AST 22 15 - 41 U/L   ALT 24 0 - 44 U/L   Alkaline Phosphatase 52 38 - 126 U/L   Total Bilirubin 0.9 0.3 - 1.2 mg/dL   GFR, Estimated 10 (L) >60 mL/min   Anion gap 13 5 - 15  Lactic acid, plasma  Result Value Ref Range   Lactic Acid, Venous 2.4 (HH) 0.5 - 1.9 mmol/L  Lactic acid, plasma  Result Value Ref Range   Lactic Acid, Venous 1.6 0.5 - 1.9 mmol/L  CBC with Differential  Result Value Ref Range   WBC 8.6 4.0 - 10.5  K/uL   RBC 4.17 (L) 4.22 - 5.81 MIL/uL   Hemoglobin 12.5 (L) 13.0 - 17.0 g/dL   HCT 37.9 (L) 39.0 - 52.0 %   MCV 90.9 80.0 - 100.0 fL   MCH 30.0 26.0 - 34.0 pg   MCHC 33.0 30.0 - 36.0 g/dL   RDW 14.3 11.5 - 15.5 %   Platelets 139 (L) 150 - 400 K/uL   nRBC 0.0 0.0 - 0.2 %   Neutrophils Relative % 84 %   Neutro Abs 7.3 1.7 - 7.7 K/uL   Lymphocytes Relative 8 %   Lymphs Abs 0.7 0.7 - 4.0 K/uL   Monocytes Relative 6 %   Monocytes Absolute 0.5 0.1 - 1.0 K/uL   Eosinophils Relative 1 %   Eosinophils Absolute 0.0 0.0 - 0.5 K/uL   Basophils Relative 0 %   Basophils Absolute 0.0 0.0 - 0.1 K/uL   Immature Granulocytes 1 %   Abs Immature Granulocytes 0.04 0.00 - 0.07 K/uL  Protime-INR  Result Value Ref Range   Prothrombin Time 14.2 11.4 - 15.2 seconds   INR 1.1 0.8 - 1.2  CBC with Differential/Platelet  Result Value Ref Range   WBC 6.0 4.0 - 10.5 K/uL   RBC 3.53 (L) 4.22 - 5.81 MIL/uL   Hemoglobin 10.4 (L) 13.0 - 17.0 g/dL   HCT 32.8 (L) 39.0 - 52.0 %   MCV 92.9  80.0 - 100.0 fL   MCH 29.5 26.0 - 34.0 pg   MCHC 31.7 30.0 - 36.0 g/dL   RDW 14.3 11.5 - 15.5 %   Platelets 103 (L) 150 - 400 K/uL   nRBC 0.0 0.0 - 0.2 %   Neutrophils Relative % 84 %   Neutro Abs 5.1 1.7 - 7.7 K/uL   Lymphocytes Relative 7 %   Lymphs Abs 0.4 (L) 0.7 - 4.0 K/uL   Monocytes Relative 7 %   Monocytes Absolute 0.4 0.1 - 1.0 K/uL   Eosinophils Relative 1 %   Eosinophils Absolute 0.1 0.0 - 0.5 K/uL   Basophils Relative 0 %   Basophils Absolute 0.0 0.0 - 0.1 K/uL   Immature Granulocytes 1 %   Abs Immature Granulocytes 0.06 0.00 - 0.07 K/uL  Comprehensive metabolic panel  Result Value Ref Range   Sodium 139 135 - 145 mmol/L   Potassium 3.5 3.5 - 5.1 mmol/L   Chloride 105 98 - 111 mmol/L   CO2 20 (L) 22 - 32 mmol/L   Glucose, Bld 105 (H) 70 - 99 mg/dL   BUN 62 (H) 8 - 23 mg/dL   Creatinine, Ser 4.86 (H) 0.61 - 1.24 mg/dL   Calcium 6.7 (L) 8.9 - 10.3 mg/dL   Total Protein 4.7 (L) 6.5 - 8.1 g/dL   Albumin 2.2 (L) 3.5 - 5.0 g/dL   AST 17 15 - 41 U/L   ALT 18 0 - 44 U/L   Alkaline Phosphatase 38 38 - 126 U/L   Total Bilirubin 0.7 0.3 - 1.2 mg/dL   GFR, Estimated 11 (L) >60 mL/min   Anion gap 14 5 - 15  Magnesium  Result Value Ref Range   Magnesium 1.5 (L) 1.7 - 2.4 mg/dL  Phosphorus  Result Value Ref Range   Phosphorus 4.1 2.5 - 4.6 mg/dL  Brain natriuretic peptide  Result Value Ref Range   B Natriuretic Peptide 45.9 0.0 - 100.0 pg/mL  Strep pneumoniae urinary antigen  Result Value Ref Range   Strep Pneumo Urinary Antigen NEGATIVE NEGATIVE  Procalcitonin - Baseline  Result Value Ref Range   Procalcitonin 0.97 ng/mL  Sodium, urine, random  Result Value Ref Range   Sodium, Ur 30 mmol/L  Creatinine, urine, random  Result Value Ref Range   Creatinine, Urine 138 mg/dL  TSH  Result Value Ref Range   TSH 0.595 0.350 - 4.500 uIU/mL  Lactic acid, plasma  Result Value Ref Range   Lactic Acid, Venous 0.9 0.5 - 1.9 mmol/L  C-reactive protein  Result Value Ref  Range   CRP 16.9 (H) <1.0 mg/dL  D-dimer, quantitative  Result Value Ref Range   D-Dimer, Quant 2.76 (H) 0.00 - 0.50 ug/mL-FEU  D-dimer, quantitative  Result Value Ref Range   D-Dimer, Quant 3.56 (H) 0.00 - 0.50 ug/mL-FEU  C-reactive protein  Result Value Ref Range   CRP 17.3 (H) <1.0 mg/dL  Comprehensive metabolic panel  Result Value Ref Range   Sodium 137 135 - 145 mmol/L   Potassium 4.5 3.5 - 5.1 mmol/L   Chloride 106 98 - 111 mmol/L   CO2 21 (L) 22 - 32 mmol/L   Glucose, Bld 207 (H) 70 - 99 mg/dL   BUN 77 (H) 8 - 23 mg/dL   Creatinine, Ser 6.99 (H) 0.61 - 1.24 mg/dL   Calcium 8.2 (L) 8.9 - 10.3 mg/dL   Total Protein 5.7 (L) 6.5 - 8.1 g/dL   Albumin 2.5 (L) 3.5 - 5.0 g/dL   AST 15 15 - 41 U/L   ALT 19 0 - 44 U/L   Alkaline Phosphatase 52 38 - 126 U/L   Total Bilirubin 0.5 0.3 - 1.2 mg/dL   GFR, Estimated 10 (L) >60 mL/min   Anion gap 10 5 - 15  CBC  Result Value Ref Range   WBC 3.5 (L) 4.0 - 10.5 K/uL   RBC 3.88 (L) 4.22 - 5.81 MIL/uL   Hemoglobin 11.4 (L) 13.0 - 17.0 g/dL   HCT 65.8 (L) 02.2 - 71.6 %   MCV 89.2 80.0 - 100.0 fL   MCH 29.4 26.0 - 34.0 pg   MCHC 32.9 30.0 - 36.0 g/dL   RDW 07.2 05.8 - 83.8 %   Platelets 122 (L) 150 - 400 K/uL   nRBC 0.6 (H) 0.0 - 0.2 %  Comprehensive metabolic panel  Result Value Ref Range   Sodium 138 135 - 145 mmol/L   Potassium 4.2 3.5 - 5.1 mmol/L   Chloride 108 98 - 111 mmol/L   CO2 20 (L) 22 - 32 mmol/L   Glucose, Bld 324 (H) 70 - 99 mg/dL   BUN 71 (H) 8 - 23 mg/dL   Creatinine, Ser 5.98 (H) 0.61 - 1.24 mg/dL   Calcium 8.3 (L) 8.9 - 10.3 mg/dL   Total Protein 5.4 (L) 6.5 - 8.1 g/dL   Albumin 2.5 (L) 3.5 - 5.0 g/dL   AST 16 15 - 41 U/L   ALT 19 0 - 44 U/L   Alkaline Phosphatase 69 38 - 126 U/L   Total Bilirubin 0.3 0.3 - 1.2 mg/dL   GFR, Estimated 16 (L) >60 mL/min   Anion gap 10 5 - 15  CBC  Result Value Ref Range   WBC 5.4 4.0 - 10.5 K/uL   RBC 3.71 (L) 4.22 - 5.81 MIL/uL   Hemoglobin 10.9 (L) 13.0 - 17.0 g/dL    HCT 87.5 (L) 56.7 - 52.0 %   MCV 88.9 80.0 - 100.0 fL   MCH 29.4 26.0 - 34.0 pg   MCHC 33.0 30.0 - 36.0 g/dL  RDW 13.9 11.5 - 15.5 %   Platelets 135 (L) 150 - 400 K/uL   nRBC 0.0 0.0 - 0.2 %  Hemoglobin A1c  Result Value Ref Range   Hgb A1c MFr Bld 6.9 (H) 4.8 - 5.6 %   Mean Plasma Glucose 151.33 mg/dL  Glucose, capillary  Result Value Ref Range   Glucose-Capillary 246 (H) 70 - 99 mg/dL  Comprehensive metabolic panel  Result Value Ref Range   Sodium 142 135 - 145 mmol/L   Potassium 4.2 3.5 - 5.1 mmol/L   Chloride 112 (H) 98 - 111 mmol/L   CO2 23 22 - 32 mmol/L   Glucose, Bld 166 (H) 70 - 99 mg/dL   BUN 65 (H) 8 - 23 mg/dL   Creatinine, Ser 2.52 (H) 0.61 - 1.24 mg/dL   Calcium 8.8 (L) 8.9 - 10.3 mg/dL   Total Protein 5.2 (L) 6.5 - 8.1 g/dL   Albumin 2.5 (L) 3.5 - 5.0 g/dL   AST 26 15 - 41 U/L   ALT 29 0 - 44 U/L   Alkaline Phosphatase 58 38 - 126 U/L   Total Bilirubin 0.5 0.3 - 1.2 mg/dL   GFR, Estimated 25 (L) >60 mL/min   Anion gap 7 5 - 15  CBC  Result Value Ref Range   WBC 7.7 4.0 - 10.5 K/uL   RBC 3.90 (L) 4.22 - 5.81 MIL/uL   Hemoglobin 11.8 (L) 13.0 - 17.0 g/dL   HCT 33.5 (L) 39.0 - 52.0 %   MCV 85.9 80.0 - 100.0 fL   MCH 30.3 26.0 - 34.0 pg   MCHC 35.2 30.0 - 36.0 g/dL   RDW 14.2 11.5 - 15.5 %   Platelets 143 (L) 150 - 400 K/uL   nRBC 0.0 0.0 - 0.2 %  Glucose, capillary  Result Value Ref Range   Glucose-Capillary 199 (H) 70 - 99 mg/dL  Glucose, capillary  Result Value Ref Range   Glucose-Capillary 194 (H) 70 - 99 mg/dL  Glucose, capillary  Result Value Ref Range   Glucose-Capillary 151 (H) 70 - 99 mg/dL  Glucose, capillary  Result Value Ref Range   Glucose-Capillary 142 (H) 70 - 99 mg/dL  Glucose, capillary  Result Value Ref Range   Glucose-Capillary 137 (H) 70 - 99 mg/dL  Glucose, capillary  Result Value Ref Range   Glucose-Capillary 180 (H) 70 - 99 mg/dL  Basic metabolic panel  Result Value Ref Range   Sodium 142 135 - 145 mmol/L   Potassium  4.1 3.5 - 5.1 mmol/L   Chloride 113 (H) 98 - 111 mmol/L   CO2 22 22 - 32 mmol/L   Glucose, Bld 99 70 - 99 mg/dL   BUN 63 (H) 8 - 23 mg/dL   Creatinine, Ser 2.29 (H) 0.61 - 1.24 mg/dL   Calcium 8.7 (L) 8.9 - 10.3 mg/dL   GFR, Estimated 28 (L) >60 mL/min   Anion gap 7 5 - 15  Glucose, capillary  Result Value Ref Range   Glucose-Capillary 92 70 - 99 mg/dL  Glucose, capillary  Result Value Ref Range   Glucose-Capillary 82 70 - 99 mg/dL  I-Stat venous blood gas, ED  Result Value Ref Range   pH, Ven 7.323 7.25 - 7.43   pCO2, Ven 43.5 (L) 44 - 60 mmHg   pO2, Ven 70 (H) 32 - 45 mmHg   Bicarbonate 22.6 20.0 - 28.0 mmol/L   TCO2 24 22 - 32 mmol/L   O2 Saturation 92 %  Acid-base deficit 3.0 (H) 0.0 - 2.0 mmol/L   Sodium 137 135 - 145 mmol/L   Potassium 4.2 3.5 - 5.1 mmol/L   Calcium, Ion 1.05 (L) 1.15 - 1.40 mmol/L   HCT 31.0 (L) 39.0 - 52.0 %   Hemoglobin 10.5 (L) 13.0 - 17.0 g/dL   Sample type VENOUS        Pertinent labs & imaging results that were available during my care of the patient were reviewed by me and considered in my medical decision making.  Assessment & Plan:  Teri was seen today for hospitalization follow-up.  Diagnoses and all orders for this visit:  Encounter for support and coordination of transition of care Today's visit was for Transitional Care Management. The patient was discharged from [facility] on 11/24/2021 with a primary diagnosis of COVID-19 infection, CAP, acute hypoxic respiratory failure, AKI, COPD exacerbation, acute anemia, hyperglycemia, generalized weakness, and chronic diastolic heart failure.  Contact with the patient and/or caregiver, by a clinical staff member, was made on 11/27/2021 and was documented as a telephone encounter within the EMR. Through chart review and discussion with the patient I have determined that management of their condition is of high complexity.  -     CMP14+EGFR -     CBC with Differential/Platelet -     DG Chest 2  View; Future  COVID-19 -     CBC with Differential/Platelet -     DG Chest 2 View; Future  Community acquired pneumonia of right upper lobe of lung Labs and CXR completed.  -     CBC with Differential/Platelet -     DG Chest 2 View; Future  Acute respiratory failure with hypoxia (HCC) COPD with acute exacerbation ([place]) Doing well on supplemental oxygen. Has follow up with PCP in October. Can reevaluate need for continued oxygen use at this time. Pt aware to continue to monitor oxygen saturation at home. -     DG Chest 2 View; Future  AKI (acute kidney injury) ([place]) Will recheck labs today. Denies increased leg swelling or decreased urine output.  -     CMP14+EGFR -     CBC with Differential/Platelet  Acute anemia Will repeat labs today.  -     CBC with Differential/Platelet  Steroid-induced hyperglycemia A1C during admission was 6.9. started on Amaryl. BS log provided to pt, aware to keep track of BS over the next 2 months. Continue Amaryl for now. Recheck A1C in December, if lower than 6.5, discuss discontinuing Amaryl as hyperglycemia likely due to recent recurrent steroid use.  -     [id number]  Chronic diastolic CHF (congestive heart failure) (HCC) Leg swelling Will repeat CMP today and check BNP. Adjustments to lasix regimen if warranted.  -     CMP14+EGFR -     Brain natriuretic peptide  Generalized weakness Gradually improving. Will recheck labs and CXR today.  -     CMP14+EGFR -     CBC with Differential/Platelet -     DG Chest 2 View; Future   Continue all other maintenance medications.  Follow up plan: Return in about 3 months (around 03/04/2022), or if symptoms worsen or fail to improve, for A1C 02/2022.   Continue healthy lifestyle choices, including diet (rich in fruits, vegetables, and lean proteins, and low in salt and simple carbohydrates) and exercise (at least 30 minutes of moderate physical activity daily).  Educational handout given for DM  The  above assessment and management plan was discussed with the  patient. The patient verbalized understanding of and has agreed to the management plan. Patient is aware to call the clinic if they develop any new symptoms or if symptoms persist or worsen. Patient is aware when to return to the clinic for a follow-up visit. Patient educated on when it is appropriate to go to the emergency department.   Monia Pouch, FNP-C [facility] [phone]

## 2021-12-04 DIAGNOSIS — U071 COVID-19: Secondary | ICD-10-CM | POA: Diagnosis not present

## 2021-12-04 DIAGNOSIS — D649 Anemia, unspecified: Secondary | ICD-10-CM | POA: Diagnosis not present

## 2021-12-04 DIAGNOSIS — N179 Acute kidney failure, unspecified: Secondary | ICD-10-CM | POA: Diagnosis not present

## 2021-12-04 DIAGNOSIS — N1832 Chronic kidney disease, stage 3b: Secondary | ICD-10-CM | POA: Diagnosis not present

## 2021-12-04 DIAGNOSIS — J441 Chronic obstructive pulmonary disease with (acute) exacerbation: Secondary | ICD-10-CM | POA: Diagnosis not present

## 2021-12-04 DIAGNOSIS — I5032 Chronic diastolic (congestive) heart failure: Secondary | ICD-10-CM | POA: Diagnosis not present

## 2021-12-04 DIAGNOSIS — G8929 Other chronic pain: Secondary | ICD-10-CM | POA: Diagnosis not present

## 2021-12-04 DIAGNOSIS — J309 Allergic rhinitis, unspecified: Secondary | ICD-10-CM | POA: Diagnosis not present

## 2021-12-04 DIAGNOSIS — R531 Weakness: Secondary | ICD-10-CM | POA: Diagnosis not present

## 2021-12-04 DIAGNOSIS — J9601 Acute respiratory failure with hypoxia: Secondary | ICD-10-CM | POA: Diagnosis not present

## 2021-12-04 LAB — CMP14+EGFR
ALT: 36 IU/L (ref 0–44)
AST: 29 IU/L (ref 0–40)
Albumin/Globulin Ratio: 1.8 (ref 1.2–2.2)
Albumin: 3.9 g/dL (ref 3.8–4.8)
Alkaline Phosphatase: 65 IU/L (ref 44–121)
BUN/Creatinine Ratio: 14 (ref 10–24)
BUN: 24 mg/dL (ref 8–27)
Bilirubin Total: 0.6 mg/dL (ref 0.0–1.2)
CO2: 28 mmol/L (ref 20–29)
Calcium: 9.4 mg/dL (ref 8.6–10.2)
Chloride: 99 mmol/L (ref 96–106)
Creatinine, Ser: 1.67 mg/dL — ABNORMAL HIGH (ref 0.76–1.27)
Globulin, Total: 2.2 g/dL (ref 1.5–4.5)
Glucose: 87 mg/dL (ref 70–99)
Potassium: 4.2 mmol/L (ref 3.5–5.2)
Sodium: 142 mmol/L (ref 134–144)
Total Protein: 6.1 g/dL (ref 6.0–8.5)
eGFR: 41 mL/min/{1.73_m2} — ABNORMAL LOW (ref >59)

## 2021-12-04 LAB — CBC WITH DIFFERENTIAL/PLATELET
Basophils Absolute: 0 10*3/uL (ref 0.0–0.2)
Basos: 1 %
EOS (ABSOLUTE): 0.1 10*3/uL (ref 0.0–0.4)
Eos: 2 %
Hematocrit: 39 % (ref 37.5–51.0)
Hemoglobin: 13 g/dL (ref 13.0–17.7)
Immature Grans (Abs): 0 10*3/uL (ref 0.0–0.1)
Immature Granulocytes: 0 %
Lymphocytes Absolute: 1.1 10*3/uL (ref 0.7–3.1)
Lymphs: 18 %
MCH: 29.9 pg (ref 26.6–33.0)
MCHC: 33.3 g/dL (ref 31.5–35.7)
MCV: 90 fL (ref 79–97)
Monocytes Absolute: 0.4 10*3/uL (ref 0.1–0.9)
Monocytes: 7 %
Neutrophils Absolute: 4.5 10*3/uL (ref 1.4–7.0)
Neutrophils: 72 %
Platelets: 101 10*3/uL — ABNORMAL LOW (ref 150–450)
RBC: 4.35 x10E6/uL (ref 4.14–5.80)
RDW: 14.1 % (ref 11.6–15.4)
WBC: 6.2 10*3/uL (ref 3.4–10.8)

## 2021-12-04 LAB — BRAIN NATRIURETIC PEPTIDE: BNP: 74.1 pg/mL (ref 0.0–100.0)

## 2021-12-05 DIAGNOSIS — J309 Allergic rhinitis, unspecified: Secondary | ICD-10-CM | POA: Diagnosis not present

## 2021-12-05 DIAGNOSIS — U071 COVID-19: Secondary | ICD-10-CM | POA: Diagnosis not present

## 2021-12-05 DIAGNOSIS — N1832 Chronic kidney disease, stage 3b: Secondary | ICD-10-CM | POA: Diagnosis not present

## 2021-12-05 DIAGNOSIS — D649 Anemia, unspecified: Secondary | ICD-10-CM | POA: Diagnosis not present

## 2021-12-05 DIAGNOSIS — G8929 Other chronic pain: Secondary | ICD-10-CM | POA: Diagnosis not present

## 2021-12-05 DIAGNOSIS — I5032 Chronic diastolic (congestive) heart failure: Secondary | ICD-10-CM | POA: Diagnosis not present

## 2021-12-05 DIAGNOSIS — J9601 Acute respiratory failure with hypoxia: Secondary | ICD-10-CM | POA: Diagnosis not present

## 2021-12-05 DIAGNOSIS — R531 Weakness: Secondary | ICD-10-CM | POA: Diagnosis not present

## 2021-12-05 DIAGNOSIS — J441 Chronic obstructive pulmonary disease with (acute) exacerbation: Secondary | ICD-10-CM | POA: Diagnosis not present

## 2021-12-05 DIAGNOSIS — N179 Acute kidney failure, unspecified: Secondary | ICD-10-CM | POA: Diagnosis not present

## 2021-12-06 ENCOUNTER — Telehealth: Payer: Self-pay | Admitting: Family

## 2021-12-06 DIAGNOSIS — J309 Allergic rhinitis, unspecified: Secondary | ICD-10-CM | POA: Diagnosis not present

## 2021-12-06 DIAGNOSIS — J9601 Acute respiratory failure with hypoxia: Secondary | ICD-10-CM | POA: Diagnosis not present

## 2021-12-06 DIAGNOSIS — J441 Chronic obstructive pulmonary disease with (acute) exacerbation: Secondary | ICD-10-CM | POA: Diagnosis not present

## 2021-12-06 DIAGNOSIS — D649 Anemia, unspecified: Secondary | ICD-10-CM | POA: Diagnosis not present

## 2021-12-06 DIAGNOSIS — R531 Weakness: Secondary | ICD-10-CM | POA: Diagnosis not present

## 2021-12-06 DIAGNOSIS — N179 Acute kidney failure, unspecified: Secondary | ICD-10-CM | POA: Diagnosis not present

## 2021-12-06 DIAGNOSIS — G8929 Other chronic pain: Secondary | ICD-10-CM | POA: Diagnosis not present

## 2021-12-06 DIAGNOSIS — U071 COVID-19: Secondary | ICD-10-CM | POA: Diagnosis not present

## 2021-12-06 DIAGNOSIS — N1832 Chronic kidney disease, stage 3b: Secondary | ICD-10-CM | POA: Diagnosis not present

## 2021-12-06 DIAGNOSIS — I5032 Chronic diastolic (congestive) heart failure: Secondary | ICD-10-CM | POA: Diagnosis not present

## 2021-12-09 ENCOUNTER — Encounter: Payer: Self-pay | Admitting: Family

## 2021-12-09 ENCOUNTER — Ambulatory Visit (INDEPENDENT_AMBULATORY_CARE_PROVIDER_SITE_OTHER): Payer: Medicare Other | Admitting: Family

## 2021-12-09 DIAGNOSIS — Z8616 Personal history of COVID-19: Secondary | ICD-10-CM | POA: Diagnosis not present

## 2021-12-09 DIAGNOSIS — M79671 Pain in right foot: Secondary | ICD-10-CM | POA: Diagnosis not present

## 2021-12-09 DIAGNOSIS — M79672 Pain in left foot: Secondary | ICD-10-CM | POA: Diagnosis not present

## 2021-12-09 MED ORDER — GABAPENTIN 100 MG PO CAPS: 100.0000 mg | ORAL_CAPSULE | Freq: Three times a day (TID) | ORAL | 3 refills | Status: DC

## 2021-12-09 NOTE — Telephone Encounter (Signed)
Appt made

## 2021-12-09 NOTE — Progress Notes (Signed)
Virtual Visit  Note Due to COVID-19 pandemic this visit was conducted virtually. This visit type was conducted due to national recommendations for restrictions regarding the COVID-19 Pandemic (e.g. social distancing, sheltering in place) in an effort to limit this patient's exposure and mitigate transmission in our community. All issues noted in this document were discussed and addressed.  A physical exam was not performed with this format.  I connected with Marcus Carr on 12/09/21 at 3:17 pm  by telephone and verified that I am speaking with the correct person using two identifiers. Marcus Carr is currently located at home and his wife is currently with him during visit. The provider, [name], FNP is located in their office at time of visit.  I discussed the limitations, risks, security and privacy concerns of performing an evaluation and management service by telephone and the availability of in person appointments. I also discussed with the patient that there may be a patient responsible charge related to this service. The patient expressed understanding and agreed to proceed.  Marcus Carr, Carr are scheduled for a virtual visit with your provider today.    Just as we do with appointments in the office, we must obtain your consent to participate.  Your consent will be active for this visit and any virtual visit you may have with one of our providers in the next 365 days.    If you have a MyChart account, I can also send a copy of this consent to you electronically.  All virtual visits are billed to your insurance company just like a traditional visit in the office.  As this is a virtual visit, video technology does not allow for your provider to perform a traditional examination.  This may limit your provider's ability to fully assess your condition.  If your provider identifies any concerns that need to be evaluated in person or the need to arrange testing such as labs, EKG, etc,  we will make arrangements to do so.    Although advances in technology are sophisticated, we cannot ensure that it will always work on either your end or our end.  If the connection with a video visit is poor, we may have to switch to a telephone visit.  With either a video or telephone visit, we are not always able to ensure that we have a secure connection.   I need to obtain your verbal consent now.   Are you willing to proceed with your visit today?   Marcus Carr has provided verbal consent on 12/09/2021 for a virtual visit (video or telephone).   [name], [place] 12/09/2021  3:20 PM    History and Present Illness:  HPI PT and wife calls the office today with complaints of bilateral foot of burning of 8 out 10. States this pain is worse when he stands for too long. He was recently hospitalized for COVID and states this started after this. Reports mild swelling of his ankles and feet. Denies any warmth, redness,   Review of Systems  All other systems reviewed and are negative.    Observations/Objective: No SOB or distress noted  Assessment and Plan: 1. Pain in both feet Start gabapentin 100 mg TID prn Elevated - gabapentin (NEURONTIN) 100 MG capsule; Take 1 capsule (100 mg total) by mouth 3 (three) times daily.  Dispense: 90 capsule; Refill: 3   2. History of COVID-19 I do not believe this is DVT related given swelling and pain in just feet and  ankles. Red flags discussed. Keep in person visit to repeat chest x-ray and labs.    I discussed the assessment and treatment plan with the patient. The patient was provided an opportunity to ask questions and all were answered. The patient agreed with the plan and demonstrated an understanding of the instructions.   The patient was advised to call back or seek an in-person evaluation if the symptoms worsen or if the condition fails to improve as anticipated.  The above assessment and management plan was discussed with the  patient. The patient verbalized understanding of and has agreed to the management plan. Patient is aware to call the clinic if symptoms persist or worsen. Patient is aware when to return to the clinic for a follow-up visit. Patient educated on when it is appropriate to go to the emergency department.   Time call ended:  3:31 pm   I provided 14 minutes of  non face-to-face time during this encounter.    [name], FNP

## 2021-12-09 NOTE — Telephone Encounter (Signed)
We could try gabapentin if he would like? If so scheduled him a telephone call with me.

## 2021-12-10 ENCOUNTER — Telehealth: Payer: Self-pay | Admitting: Family

## 2021-12-10 DIAGNOSIS — N1832 Chronic kidney disease, stage 3b: Secondary | ICD-10-CM | POA: Diagnosis not present

## 2021-12-10 DIAGNOSIS — N179 Acute kidney failure, unspecified: Secondary | ICD-10-CM | POA: Diagnosis not present

## 2021-12-10 DIAGNOSIS — G8929 Other chronic pain: Secondary | ICD-10-CM | POA: Diagnosis not present

## 2021-12-10 DIAGNOSIS — J441 Chronic obstructive pulmonary disease with (acute) exacerbation: Secondary | ICD-10-CM | POA: Diagnosis not present

## 2021-12-10 DIAGNOSIS — D649 Anemia, unspecified: Secondary | ICD-10-CM | POA: Diagnosis not present

## 2021-12-10 DIAGNOSIS — R531 Weakness: Secondary | ICD-10-CM | POA: Diagnosis not present

## 2021-12-10 DIAGNOSIS — J309 Allergic rhinitis, unspecified: Secondary | ICD-10-CM | POA: Diagnosis not present

## 2021-12-10 DIAGNOSIS — I5032 Chronic diastolic (congestive) heart failure: Secondary | ICD-10-CM | POA: Diagnosis not present

## 2021-12-10 DIAGNOSIS — J9601 Acute respiratory failure with hypoxia: Secondary | ICD-10-CM | POA: Diagnosis not present

## 2021-12-10 DIAGNOSIS — U071 COVID-19: Secondary | ICD-10-CM | POA: Diagnosis not present

## 2021-12-10 NOTE — Telephone Encounter (Signed)
Patient aware and verbalized understanding. °

## 2021-12-10 NOTE — Telephone Encounter (Signed)
If it is causing him to shake stop medication.

## 2021-12-11 DIAGNOSIS — J309 Allergic rhinitis, unspecified: Secondary | ICD-10-CM | POA: Diagnosis not present

## 2021-12-11 DIAGNOSIS — N183 Chronic kidney disease, stage 3 unspecified: Secondary | ICD-10-CM | POA: Diagnosis not present

## 2021-12-11 DIAGNOSIS — R944 Abnormal results of kidney function studies: Secondary | ICD-10-CM | POA: Diagnosis not present

## 2021-12-11 DIAGNOSIS — K649 Unspecified hemorrhoids: Secondary | ICD-10-CM | POA: Diagnosis not present

## 2021-12-11 DIAGNOSIS — I5032 Chronic diastolic (congestive) heart failure: Secondary | ICD-10-CM | POA: Diagnosis not present

## 2021-12-11 DIAGNOSIS — K5900 Constipation, unspecified: Secondary | ICD-10-CM | POA: Diagnosis not present

## 2021-12-11 DIAGNOSIS — Z87891 Personal history of nicotine dependence: Secondary | ICD-10-CM | POA: Diagnosis not present

## 2021-12-11 DIAGNOSIS — K8020 Calculus of gallbladder without cholecystitis without obstruction: Secondary | ICD-10-CM | POA: Diagnosis not present

## 2021-12-11 DIAGNOSIS — I129 Hypertensive chronic kidney disease with stage 1 through stage 4 chronic kidney disease, or unspecified chronic kidney disease: Secondary | ICD-10-CM | POA: Diagnosis not present

## 2021-12-11 DIAGNOSIS — Z79899 Other long term (current) drug therapy: Secondary | ICD-10-CM | POA: Diagnosis not present

## 2021-12-11 DIAGNOSIS — Z880 Allergy status to penicillin: Secondary | ICD-10-CM | POA: Diagnosis not present

## 2021-12-11 DIAGNOSIS — N1832 Chronic kidney disease, stage 3b: Secondary | ICD-10-CM | POA: Diagnosis not present

## 2021-12-11 DIAGNOSIS — E785 Hyperlipidemia, unspecified: Secondary | ICD-10-CM | POA: Diagnosis not present

## 2021-12-11 DIAGNOSIS — R531 Weakness: Secondary | ICD-10-CM | POA: Diagnosis not present

## 2021-12-11 DIAGNOSIS — D649 Anemia, unspecified: Secondary | ICD-10-CM | POA: Diagnosis not present

## 2021-12-11 DIAGNOSIS — Z7982 Long term (current) use of aspirin: Secondary | ICD-10-CM | POA: Diagnosis not present

## 2021-12-11 DIAGNOSIS — R7989 Other specified abnormal findings of blood chemistry: Secondary | ICD-10-CM | POA: Diagnosis not present

## 2021-12-11 DIAGNOSIS — J441 Chronic obstructive pulmonary disease with (acute) exacerbation: Secondary | ICD-10-CM | POA: Diagnosis not present

## 2021-12-11 DIAGNOSIS — K6389 Other specified diseases of intestine: Secondary | ICD-10-CM | POA: Diagnosis not present

## 2021-12-11 DIAGNOSIS — N281 Cyst of kidney, acquired: Secondary | ICD-10-CM | POA: Diagnosis not present

## 2021-12-11 DIAGNOSIS — G8929 Other chronic pain: Secondary | ICD-10-CM | POA: Diagnosis not present

## 2021-12-11 DIAGNOSIS — R339 Retention of urine, unspecified: Secondary | ICD-10-CM | POA: Diagnosis not present

## 2021-12-11 DIAGNOSIS — J9601 Acute respiratory failure with hypoxia: Secondary | ICD-10-CM | POA: Diagnosis not present

## 2021-12-11 DIAGNOSIS — U071 COVID-19: Secondary | ICD-10-CM | POA: Diagnosis not present

## 2021-12-11 DIAGNOSIS — N179 Acute kidney failure, unspecified: Secondary | ICD-10-CM | POA: Diagnosis not present

## 2021-12-11 DIAGNOSIS — K449 Diaphragmatic hernia without obstruction or gangrene: Secondary | ICD-10-CM | POA: Diagnosis not present

## 2021-12-12 DIAGNOSIS — N281 Cyst of kidney, acquired: Secondary | ICD-10-CM | POA: Diagnosis not present

## 2021-12-12 DIAGNOSIS — K449 Diaphragmatic hernia without obstruction or gangrene: Secondary | ICD-10-CM | POA: Diagnosis not present

## 2021-12-12 DIAGNOSIS — K6389 Other specified diseases of intestine: Secondary | ICD-10-CM | POA: Diagnosis not present

## 2021-12-12 DIAGNOSIS — K8020 Calculus of gallbladder without cholecystitis without obstruction: Secondary | ICD-10-CM | POA: Diagnosis not present

## 2021-12-17 DIAGNOSIS — U071 COVID-19: Secondary | ICD-10-CM | POA: Diagnosis not present

## 2021-12-17 DIAGNOSIS — N179 Acute kidney failure, unspecified: Secondary | ICD-10-CM | POA: Diagnosis not present

## 2021-12-17 DIAGNOSIS — G8929 Other chronic pain: Secondary | ICD-10-CM | POA: Diagnosis not present

## 2021-12-17 DIAGNOSIS — I5032 Chronic diastolic (congestive) heart failure: Secondary | ICD-10-CM | POA: Diagnosis not present

## 2021-12-17 DIAGNOSIS — J441 Chronic obstructive pulmonary disease with (acute) exacerbation: Secondary | ICD-10-CM | POA: Diagnosis not present

## 2021-12-17 DIAGNOSIS — R531 Weakness: Secondary | ICD-10-CM | POA: Diagnosis not present

## 2021-12-17 DIAGNOSIS — J309 Allergic rhinitis, unspecified: Secondary | ICD-10-CM | POA: Diagnosis not present

## 2021-12-17 DIAGNOSIS — N1832 Chronic kidney disease, stage 3b: Secondary | ICD-10-CM | POA: Diagnosis not present

## 2021-12-17 DIAGNOSIS — D649 Anemia, unspecified: Secondary | ICD-10-CM | POA: Diagnosis not present

## 2021-12-17 DIAGNOSIS — J9601 Acute respiratory failure with hypoxia: Secondary | ICD-10-CM | POA: Diagnosis not present

## 2021-12-19 ENCOUNTER — Other Ambulatory Visit: Payer: Self-pay | Admitting: Family

## 2021-12-19 DIAGNOSIS — U071 COVID-19: Secondary | ICD-10-CM | POA: Diagnosis not present

## 2021-12-19 DIAGNOSIS — J441 Chronic obstructive pulmonary disease with (acute) exacerbation: Secondary | ICD-10-CM | POA: Diagnosis not present

## 2021-12-19 DIAGNOSIS — E785 Hyperlipidemia, unspecified: Secondary | ICD-10-CM

## 2021-12-19 DIAGNOSIS — D649 Anemia, unspecified: Secondary | ICD-10-CM | POA: Diagnosis not present

## 2021-12-19 DIAGNOSIS — J9601 Acute respiratory failure with hypoxia: Secondary | ICD-10-CM | POA: Diagnosis not present

## 2021-12-19 DIAGNOSIS — N1832 Chronic kidney disease, stage 3b: Secondary | ICD-10-CM | POA: Diagnosis not present

## 2021-12-19 DIAGNOSIS — N179 Acute kidney failure, unspecified: Secondary | ICD-10-CM | POA: Diagnosis not present

## 2021-12-19 DIAGNOSIS — G8929 Other chronic pain: Secondary | ICD-10-CM | POA: Diagnosis not present

## 2021-12-19 DIAGNOSIS — J309 Allergic rhinitis, unspecified: Secondary | ICD-10-CM | POA: Diagnosis not present

## 2021-12-19 DIAGNOSIS — E8881 Metabolic syndrome: Secondary | ICD-10-CM

## 2021-12-19 DIAGNOSIS — I5032 Chronic diastolic (congestive) heart failure: Secondary | ICD-10-CM | POA: Diagnosis not present

## 2021-12-19 DIAGNOSIS — R531 Weakness: Secondary | ICD-10-CM | POA: Diagnosis not present

## 2021-12-24 ENCOUNTER — Other Ambulatory Visit: Payer: Self-pay | Admitting: Family

## 2021-12-24 ENCOUNTER — Ambulatory Visit: Payer: Medicare Other | Admitting: Family

## 2021-12-24 DIAGNOSIS — U071 COVID-19: Secondary | ICD-10-CM | POA: Diagnosis not present

## 2021-12-24 DIAGNOSIS — J441 Chronic obstructive pulmonary disease with (acute) exacerbation: Secondary | ICD-10-CM | POA: Diagnosis not present

## 2021-12-25 ENCOUNTER — Telehealth: Payer: Self-pay | Admitting: Family

## 2021-12-25 DIAGNOSIS — E1122 Type 2 diabetes mellitus with diabetic chronic kidney disease: Secondary | ICD-10-CM | POA: Diagnosis not present

## 2021-12-25 DIAGNOSIS — J441 Chronic obstructive pulmonary disease with (acute) exacerbation: Secondary | ICD-10-CM | POA: Diagnosis not present

## 2021-12-25 DIAGNOSIS — I5032 Chronic diastolic (congestive) heart failure: Secondary | ICD-10-CM | POA: Diagnosis not present

## 2021-12-25 DIAGNOSIS — N179 Acute kidney failure, unspecified: Secondary | ICD-10-CM | POA: Diagnosis not present

## 2021-12-25 DIAGNOSIS — F32A Depression, unspecified: Secondary | ICD-10-CM | POA: Diagnosis not present

## 2021-12-25 DIAGNOSIS — J440 Chronic obstructive pulmonary disease with acute lower respiratory infection: Secondary | ICD-10-CM | POA: Diagnosis not present

## 2021-12-25 DIAGNOSIS — D631 Anemia in chronic kidney disease: Secondary | ICD-10-CM | POA: Diagnosis not present

## 2021-12-25 DIAGNOSIS — E785 Hyperlipidemia, unspecified: Secondary | ICD-10-CM | POA: Diagnosis not present

## 2021-12-25 DIAGNOSIS — J1282 Pneumonia due to coronavirus disease 2019: Secondary | ICD-10-CM | POA: Diagnosis not present

## 2021-12-25 DIAGNOSIS — J690 Pneumonitis due to inhalation of food and vomit: Secondary | ICD-10-CM | POA: Diagnosis not present

## 2021-12-25 DIAGNOSIS — M545 Low back pain, unspecified: Secondary | ICD-10-CM | POA: Diagnosis not present

## 2021-12-25 DIAGNOSIS — Z7982 Long term (current) use of aspirin: Secondary | ICD-10-CM | POA: Diagnosis not present

## 2021-12-25 DIAGNOSIS — I130 Hypertensive heart and chronic kidney disease with heart failure and stage 1 through stage 4 chronic kidney disease, or unspecified chronic kidney disease: Secondary | ICD-10-CM | POA: Diagnosis not present

## 2021-12-25 DIAGNOSIS — Z87891 Personal history of nicotine dependence: Secondary | ICD-10-CM | POA: Diagnosis not present

## 2021-12-25 DIAGNOSIS — G8929 Other chronic pain: Secondary | ICD-10-CM | POA: Diagnosis not present

## 2021-12-25 DIAGNOSIS — Z7984 Long term (current) use of oral hypoglycemic drugs: Secondary | ICD-10-CM | POA: Diagnosis not present

## 2021-12-25 DIAGNOSIS — U071 COVID-19: Secondary | ICD-10-CM | POA: Diagnosis not present

## 2021-12-25 DIAGNOSIS — J309 Allergic rhinitis, unspecified: Secondary | ICD-10-CM | POA: Diagnosis not present

## 2021-12-25 DIAGNOSIS — R911 Solitary pulmonary nodule: Secondary | ICD-10-CM | POA: Diagnosis not present

## 2021-12-25 DIAGNOSIS — M109 Gout, unspecified: Secondary | ICD-10-CM | POA: Diagnosis not present

## 2021-12-25 DIAGNOSIS — E1165 Type 2 diabetes mellitus with hyperglycemia: Secondary | ICD-10-CM | POA: Diagnosis not present

## 2021-12-25 DIAGNOSIS — Z79899 Other long term (current) drug therapy: Secondary | ICD-10-CM | POA: Diagnosis not present

## 2021-12-25 DIAGNOSIS — N1832 Chronic kidney disease, stage 3b: Secondary | ICD-10-CM | POA: Diagnosis not present

## 2021-12-25 NOTE — Telephone Encounter (Signed)
FYI: during her visit yesterday w/ pt he reported that he fell last Friday, not sure if he passed out or not, may have caught himself w/ his R hand it was a little swollen and he has a carpet burn to his forehead, he has no other complaints or pains.

## 2021-12-26 NOTE — Telephone Encounter (Signed)
Appt made

## 2021-12-26 NOTE — Telephone Encounter (Signed)
Pt needs to be evaluated in person if he is passing out.

## 2021-12-27 ENCOUNTER — Ambulatory Visit: Payer: Medicare Other | Admitting: Family

## 2021-12-27 DIAGNOSIS — J441 Chronic obstructive pulmonary disease with (acute) exacerbation: Secondary | ICD-10-CM | POA: Diagnosis not present

## 2021-12-27 DIAGNOSIS — U071 COVID-19: Secondary | ICD-10-CM | POA: Diagnosis not present

## 2021-12-30 ENCOUNTER — Ambulatory Visit (INDEPENDENT_AMBULATORY_CARE_PROVIDER_SITE_OTHER): Payer: Medicare Other

## 2021-12-30 ENCOUNTER — Other Ambulatory Visit: Payer: Self-pay | Admitting: Family

## 2021-12-30 ENCOUNTER — Encounter: Payer: Self-pay | Admitting: Family

## 2021-12-30 ENCOUNTER — Ambulatory Visit (INDEPENDENT_AMBULATORY_CARE_PROVIDER_SITE_OTHER): Payer: Medicare Other | Admitting: Family

## 2021-12-30 VITALS — BP 124/80 | HR 87 | Temp 97.0°F | Ht 68.0 in

## 2021-12-30 DIAGNOSIS — W19XXXA Unspecified fall, initial encounter: Secondary | ICD-10-CM

## 2021-12-30 DIAGNOSIS — N1832 Chronic kidney disease, stage 3b: Secondary | ICD-10-CM

## 2021-12-30 DIAGNOSIS — J189 Pneumonia, unspecified organism: Secondary | ICD-10-CM

## 2021-12-30 DIAGNOSIS — J441 Chronic obstructive pulmonary disease with (acute) exacerbation: Secondary | ICD-10-CM | POA: Diagnosis not present

## 2021-12-30 DIAGNOSIS — I5032 Chronic diastolic (congestive) heart failure: Secondary | ICD-10-CM | POA: Diagnosis not present

## 2021-12-30 DIAGNOSIS — M25531 Pain in right wrist: Secondary | ICD-10-CM | POA: Diagnosis not present

## 2021-12-30 DIAGNOSIS — I10 Essential (primary) hypertension: Secondary | ICD-10-CM

## 2021-12-30 MED ORDER — ACCU-CHEK SOFTCLIX LANCETS MISC: 12 refills | Status: DC

## 2021-12-30 NOTE — Patient Instructions (Signed)
Fall Prevention in Hospitals, Adult Being a patient in the hospital puts you at risk for falling. Falls can cause serious injury and harm, but they can be prevented. It is important to understand what puts you at risk for falling and what you and your health care team can do to prevent you from falling. If you or a loved one falls in the hospital, it is important to tell the hospital staff about it. What increases my risk? Certain conditions and treatments may increase your risk of falling in the hospital. These include: Being in an unfamiliar environment, especially when using the bathroom at night. Having surgery. Being on bed rest. Taking many medicines or certain types of medicines, such as sleeping pills. Having tubes in place, such as IV lines or catheters. Other risk factors for falls in a hospital include: Having difficulty with hearing or vision. Having a change in thinking or behavior, such as confusion. Having depression. Having trouble with balance or feeling dizzy. Needing to use the toilet frequently. Having fallen during the past 3 months. Having low blood pressure. What are some strategies for preventing falls? If you or a loved one has to stay in the hospital: Ask about which fall prevention strategies will be in place. Speak up if the fall prevention plan changes. Ask for help moving around, especially after surgery or when not feeling well. If you have been asked to call for help when getting up, do not get up by yourself. Asking for help to get up is for your safety, and the staff is there to help you. Wear nonskid footwear. Get up slowly, and sit at the side of the bed for a few minutes before standing up. Keep items you need close to you, such as the nurse call button or a phone, so that you do not need to reach for them. Wear eyeglasses or hearing aids if they have been prescribed. Have someone stay in the hospital with you or your loved one. Ask if sleeping pills or  other medicines that can cause confusion are necessary. What does the hospital staff do to help prevent falls?     Hospitals have systems in place to prevent falls and accidents, which may involve: Discussing your fall risks and making a personalized fall prevention plan. Checking in regularly to see if you need help. Placing an arm band on your wrist or a sign near your room to alert other staff of your needs. Using an alarm on your hospital bed. This is an alarm that goes off if you get out of bed and forget to call for help. Keeping the bed in a low and locked position. Keeping the area around the bed and bathroom well-lit and free from clutter. Keeping your room quiet, so that you can sleep and be well rested. Using safety equipment, such as: A belt around your waist. Walkers, crutches, and other devices for support. Safety beds, such as low beds, or cushions on the floor next to the bed. Having a staff person stay with you (one-on-one observation), even when you are using the bathroom. This is for your safety. Using video monitoring. This allows a staff member to come to you if you need help. What other actions can I take to lower my risk of falls? Check in regularly with your health care provider or pharmacist to review all medicines that you take. Make sure that you have a regular exercise program to stay fit. This will help you maintain your balance.  Talk with a physical therapist or trainer if recommended by your health care provider. He or she can help you improve your strength, balance, and endurance. If you are over age 80: Ask your health care provider if you need a calcium or vitamin D supplement. Have your eyes and hearing checked every year. Have your feet checked every year. Where to find more information Centers for Disease Control and Prevention: http://www.wolf.info/ Summary Being in an unfamiliar environment, such as the hospital, increases your risk for falling. If you have  been asked to call for help when getting up, do not get up by yourself. Asking for help to get up is for your safety, and the staff is there to help you. Ask about which fall prevention strategies will be in place. Speak up if the fall prevention plan changes. If you or a loved one falls, tell the hospital staff. This is important. This information is not intended to replace advice given to you by your health care provider. Make sure you discuss any questions you have with your health care provider. Document Revised: 10/12/2019 Document Reviewed: 10/12/2019 Elsevier Patient Education  [place].

## 2021-12-30 NOTE — Progress Notes (Signed)
Subjective:    Patient ID: Marcus Carr, male    DOB: 1941/02/16, 81 y.o.   MRN: [id number]  Chief Complaint  Patient presents with   Medical Management of Chronic Issues   Fall    Right hand pain   PT presents to the office today with right hand swelling after a fall. He reports he passed out at home and landed on his right hand.   He was also hospitalization with COVID and CAP on 08/19/21. He was was discharged on 2l of O2. He has COPD.   He has CKD and avoids NSAID's.   Fall The accident occurred 5 to 7 days ago. He landed on [place]. Point of impact: right wrist. The pain is present in the right wrist. The pain is at a severity of 7/10. The pain is moderate. The symptoms are aggravated by movement.  Hypertension This is a chronic problem. The current episode started more than 1 year ago. The problem has been resolved since onset. The problem is controlled. Associated symptoms include malaise/fatigue, peripheral edema and shortness of breath. Risk factors for coronary artery disease include dyslipidemia, obesity, male gender and sedentary lifestyle. The current treatment provides moderate improvement.  Congestive Heart Failure Presents for follow-up visit. Associated symptoms include edema, fatigue and shortness of breath. The symptoms have been stable.      Review of Systems  Constitutional:  Positive for fatigue and malaise/fatigue.  Respiratory:  Positive for shortness of breath.   All other systems reviewed and are negative.      Objective:   Physical Exam Vitals reviewed.  Constitutional:      General: He is not in acute distress.    Appearance: He is well-developed. He is obese.  HENT:     Head: Normocephalic.     Right Ear: Tympanic membrane normal.     Left Ear: Tympanic membrane normal.  Eyes:     General:        Right eye: No discharge.        Left eye: No discharge.     Pupils: Pupils are equal, round, and reactive to light.  Neck:     Thyroid:  No thyromegaly.  Cardiovascular:     Rate and Rhythm: Normal rate and regular rhythm.     Heart sounds: Normal heart sounds. No murmur heard. Pulmonary:     Effort: Pulmonary effort is normal. No respiratory distress.     Breath sounds: Normal breath sounds. No wheezing.  Abdominal:     General: Bowel sounds are normal. There is no distension.     Palpations: Abdomen is soft.     Tenderness: There is no abdominal tenderness.  Musculoskeletal:        General: Swelling (right wrist swelling and pain with flexion) present. No tenderness.     Cervical back: Normal range of motion and neck supple.  Skin:    General: Skin is warm and dry.     Findings: No erythema or rash.  Neurological:     Mental Status: He is alert and oriented to person, place, and time.     Cranial Nerves: No cranial nerve deficit.     Motor: Weakness (generalized weakness, using wheelchair) present.     Deep Tendon Reflexes: Reflexes are normal and symmetric.  Psychiatric:        Behavior: Behavior normal.        Thought Content: Thought content normal.        Judgment: Judgment normal.  BP 124/80   Pulse 87   Temp (!) 97 F (36.1 C) (Temporal)   Ht '5\' 8"'  (1.727 m)   SpO2 94%   BMI 42.73 kg/m      Assessment & Plan:  Marcus Carr comes in today with chief complaint of Medical Management of Chronic Issues and Fall (Right hand pain)   Diagnosis and orders addressed:  1. Fall, initial encounter - CMP14+EGFR - CBC with Differential/Platelet  2. Primary hypertension - CMP14+EGFR - CBC with Differential/Platelet  3. Stage 3b chronic kidney disease ([place]) - CMP14+EGFR - CBC with Differential/Platelet  4. Morbid obesity ([place]) - CMP14+EGFR - CBC with Differential/Platelet  5. Community acquired pneumonia of left upper lobe of lung - CMP14+EGFR - CBC with Differential/Platelet - DG Chest 2 View  6. Chronic diastolic CHF (congestive heart failure) (HCC) - CMP14+EGFR - CBC with  Differential/Platelet  7. COPD with acute exacerbation ([place])  8. Right wrist pain   Labs pending Health Maintenance reviewed Diet and exercise encouraged  Follow up plan: 1 week   [name], FNP

## 2021-12-31 DIAGNOSIS — I5032 Chronic diastolic (congestive) heart failure: Secondary | ICD-10-CM | POA: Diagnosis not present

## 2021-12-31 DIAGNOSIS — Z87891 Personal history of nicotine dependence: Secondary | ICD-10-CM | POA: Diagnosis not present

## 2021-12-31 DIAGNOSIS — E1122 Type 2 diabetes mellitus with diabetic chronic kidney disease: Secondary | ICD-10-CM | POA: Diagnosis not present

## 2021-12-31 DIAGNOSIS — J441 Chronic obstructive pulmonary disease with (acute) exacerbation: Secondary | ICD-10-CM | POA: Diagnosis not present

## 2021-12-31 DIAGNOSIS — M109 Gout, unspecified: Secondary | ICD-10-CM | POA: Diagnosis not present

## 2021-12-31 DIAGNOSIS — Z7982 Long term (current) use of aspirin: Secondary | ICD-10-CM | POA: Diagnosis not present

## 2021-12-31 DIAGNOSIS — N179 Acute kidney failure, unspecified: Secondary | ICD-10-CM | POA: Diagnosis not present

## 2021-12-31 DIAGNOSIS — J309 Allergic rhinitis, unspecified: Secondary | ICD-10-CM | POA: Diagnosis not present

## 2021-12-31 DIAGNOSIS — N1832 Chronic kidney disease, stage 3b: Secondary | ICD-10-CM | POA: Diagnosis not present

## 2021-12-31 DIAGNOSIS — D631 Anemia in chronic kidney disease: Secondary | ICD-10-CM | POA: Diagnosis not present

## 2021-12-31 DIAGNOSIS — E785 Hyperlipidemia, unspecified: Secondary | ICD-10-CM | POA: Diagnosis not present

## 2021-12-31 DIAGNOSIS — F32A Depression, unspecified: Secondary | ICD-10-CM | POA: Diagnosis not present

## 2021-12-31 DIAGNOSIS — J440 Chronic obstructive pulmonary disease with acute lower respiratory infection: Secondary | ICD-10-CM | POA: Diagnosis not present

## 2021-12-31 DIAGNOSIS — Z7984 Long term (current) use of oral hypoglycemic drugs: Secondary | ICD-10-CM | POA: Diagnosis not present

## 2021-12-31 DIAGNOSIS — M545 Low back pain, unspecified: Secondary | ICD-10-CM | POA: Diagnosis not present

## 2021-12-31 DIAGNOSIS — J690 Pneumonitis due to inhalation of food and vomit: Secondary | ICD-10-CM | POA: Diagnosis not present

## 2021-12-31 DIAGNOSIS — E1165 Type 2 diabetes mellitus with hyperglycemia: Secondary | ICD-10-CM | POA: Diagnosis not present

## 2021-12-31 DIAGNOSIS — R911 Solitary pulmonary nodule: Secondary | ICD-10-CM | POA: Diagnosis not present

## 2021-12-31 DIAGNOSIS — Z79899 Other long term (current) drug therapy: Secondary | ICD-10-CM | POA: Diagnosis not present

## 2021-12-31 DIAGNOSIS — J1282 Pneumonia due to coronavirus disease 2019: Secondary | ICD-10-CM | POA: Diagnosis not present

## 2021-12-31 DIAGNOSIS — U071 COVID-19: Secondary | ICD-10-CM | POA: Diagnosis not present

## 2021-12-31 DIAGNOSIS — G8929 Other chronic pain: Secondary | ICD-10-CM | POA: Diagnosis not present

## 2021-12-31 DIAGNOSIS — I130 Hypertensive heart and chronic kidney disease with heart failure and stage 1 through stage 4 chronic kidney disease, or unspecified chronic kidney disease: Secondary | ICD-10-CM | POA: Diagnosis not present

## 2021-12-31 LAB — CBC WITH DIFFERENTIAL/PLATELET
Basophils Absolute: 0 10*3/uL (ref 0.0–0.2)
Basos: 1 %
EOS (ABSOLUTE): 0.1 10*3/uL (ref 0.0–0.4)
Eos: 1 %
Hematocrit: 37.2 % — ABNORMAL LOW (ref 37.5–51.0)
Hemoglobin: 12.3 g/dL — ABNORMAL LOW (ref 13.0–17.7)
Immature Grans (Abs): 0 10*3/uL (ref 0.0–0.1)
Immature Granulocytes: 0 %
Lymphocytes Absolute: 0.8 10*3/uL (ref 0.7–3.1)
Lymphs: 13 %
MCH: 30.1 pg (ref 26.6–33.0)
MCHC: 33.1 g/dL (ref 31.5–35.7)
MCV: 91 fL (ref 79–97)
Monocytes Absolute: 0.5 10*3/uL (ref 0.1–0.9)
Monocytes: 8 %
Neutrophils Absolute: 4.8 10*3/uL (ref 1.4–7.0)
Neutrophils: 77 %
Platelets: 164 10*3/uL (ref 150–450)
RBC: 4.08 x10E6/uL — ABNORMAL LOW (ref 4.14–5.80)
RDW: 15.4 % (ref 11.6–15.4)
WBC: 6.2 10*3/uL (ref 3.4–10.8)

## 2021-12-31 LAB — CMP14+EGFR
ALT: 20 IU/L (ref 0–44)
AST: 26 IU/L (ref 0–40)
Albumin/Globulin Ratio: 1.5 (ref 1.2–2.2)
Albumin: 4 g/dL (ref 3.8–4.8)
Alkaline Phosphatase: 67 IU/L (ref 44–121)
BUN/Creatinine Ratio: 17 (ref 10–24)
BUN: 24 mg/dL (ref 8–27)
Bilirubin Total: 0.5 mg/dL (ref 0.0–1.2)
CO2: 21 mmol/L (ref 20–29)
Calcium: 9.3 mg/dL (ref 8.6–10.2)
Chloride: 105 mmol/L (ref 96–106)
Creatinine, Ser: 1.41 mg/dL — ABNORMAL HIGH (ref 0.76–1.27)
Globulin, Total: 2.7 g/dL (ref 1.5–4.5)
Glucose: 76 mg/dL (ref 70–99)
Potassium: 4.9 mmol/L (ref 3.5–5.2)
Sodium: 141 mmol/L (ref 134–144)
Total Protein: 6.7 g/dL (ref 6.0–8.5)
eGFR: 51 mL/min/{1.73_m2} — ABNORMAL LOW (ref >59)

## 2022-01-01 DIAGNOSIS — D631 Anemia in chronic kidney disease: Secondary | ICD-10-CM | POA: Diagnosis not present

## 2022-01-01 DIAGNOSIS — I5032 Chronic diastolic (congestive) heart failure: Secondary | ICD-10-CM | POA: Diagnosis not present

## 2022-01-01 DIAGNOSIS — Z87891 Personal history of nicotine dependence: Secondary | ICD-10-CM | POA: Diagnosis not present

## 2022-01-01 DIAGNOSIS — I130 Hypertensive heart and chronic kidney disease with heart failure and stage 1 through stage 4 chronic kidney disease, or unspecified chronic kidney disease: Secondary | ICD-10-CM | POA: Diagnosis not present

## 2022-01-01 DIAGNOSIS — E785 Hyperlipidemia, unspecified: Secondary | ICD-10-CM | POA: Diagnosis not present

## 2022-01-01 DIAGNOSIS — U071 COVID-19: Secondary | ICD-10-CM | POA: Diagnosis not present

## 2022-01-01 DIAGNOSIS — Z7984 Long term (current) use of oral hypoglycemic drugs: Secondary | ICD-10-CM | POA: Diagnosis not present

## 2022-01-01 DIAGNOSIS — J441 Chronic obstructive pulmonary disease with (acute) exacerbation: Secondary | ICD-10-CM | POA: Diagnosis not present

## 2022-01-01 DIAGNOSIS — G8929 Other chronic pain: Secondary | ICD-10-CM | POA: Diagnosis not present

## 2022-01-01 DIAGNOSIS — J1282 Pneumonia due to coronavirus disease 2019: Secondary | ICD-10-CM | POA: Diagnosis not present

## 2022-01-01 DIAGNOSIS — M109 Gout, unspecified: Secondary | ICD-10-CM | POA: Diagnosis not present

## 2022-01-01 DIAGNOSIS — N179 Acute kidney failure, unspecified: Secondary | ICD-10-CM | POA: Diagnosis not present

## 2022-01-01 DIAGNOSIS — Z79899 Other long term (current) drug therapy: Secondary | ICD-10-CM | POA: Diagnosis not present

## 2022-01-01 DIAGNOSIS — Z7982 Long term (current) use of aspirin: Secondary | ICD-10-CM | POA: Diagnosis not present

## 2022-01-01 DIAGNOSIS — E1122 Type 2 diabetes mellitus with diabetic chronic kidney disease: Secondary | ICD-10-CM | POA: Diagnosis not present

## 2022-01-01 DIAGNOSIS — J690 Pneumonitis due to inhalation of food and vomit: Secondary | ICD-10-CM | POA: Diagnosis not present

## 2022-01-01 DIAGNOSIS — J309 Allergic rhinitis, unspecified: Secondary | ICD-10-CM | POA: Diagnosis not present

## 2022-01-01 DIAGNOSIS — M545 Low back pain, unspecified: Secondary | ICD-10-CM | POA: Diagnosis not present

## 2022-01-01 DIAGNOSIS — E1165 Type 2 diabetes mellitus with hyperglycemia: Secondary | ICD-10-CM | POA: Diagnosis not present

## 2022-01-01 DIAGNOSIS — F32A Depression, unspecified: Secondary | ICD-10-CM | POA: Diagnosis not present

## 2022-01-01 DIAGNOSIS — N1832 Chronic kidney disease, stage 3b: Secondary | ICD-10-CM | POA: Diagnosis not present

## 2022-01-01 DIAGNOSIS — R911 Solitary pulmonary nodule: Secondary | ICD-10-CM | POA: Diagnosis not present

## 2022-01-01 DIAGNOSIS — J440 Chronic obstructive pulmonary disease with acute lower respiratory infection: Secondary | ICD-10-CM | POA: Diagnosis not present

## 2022-01-02 DIAGNOSIS — Z7982 Long term (current) use of aspirin: Secondary | ICD-10-CM | POA: Diagnosis not present

## 2022-01-02 DIAGNOSIS — Z7984 Long term (current) use of oral hypoglycemic drugs: Secondary | ICD-10-CM | POA: Diagnosis not present

## 2022-01-02 DIAGNOSIS — I5032 Chronic diastolic (congestive) heart failure: Secondary | ICD-10-CM | POA: Diagnosis not present

## 2022-01-02 DIAGNOSIS — M109 Gout, unspecified: Secondary | ICD-10-CM | POA: Diagnosis not present

## 2022-01-02 DIAGNOSIS — R911 Solitary pulmonary nodule: Secondary | ICD-10-CM | POA: Diagnosis not present

## 2022-01-02 DIAGNOSIS — J309 Allergic rhinitis, unspecified: Secondary | ICD-10-CM | POA: Diagnosis not present

## 2022-01-02 DIAGNOSIS — E1122 Type 2 diabetes mellitus with diabetic chronic kidney disease: Secondary | ICD-10-CM | POA: Diagnosis not present

## 2022-01-02 DIAGNOSIS — J690 Pneumonitis due to inhalation of food and vomit: Secondary | ICD-10-CM | POA: Diagnosis not present

## 2022-01-02 DIAGNOSIS — M545 Low back pain, unspecified: Secondary | ICD-10-CM | POA: Diagnosis not present

## 2022-01-02 DIAGNOSIS — G8929 Other chronic pain: Secondary | ICD-10-CM | POA: Diagnosis not present

## 2022-01-02 DIAGNOSIS — D631 Anemia in chronic kidney disease: Secondary | ICD-10-CM | POA: Diagnosis not present

## 2022-01-02 DIAGNOSIS — U071 COVID-19: Secondary | ICD-10-CM | POA: Diagnosis not present

## 2022-01-02 DIAGNOSIS — F32A Depression, unspecified: Secondary | ICD-10-CM | POA: Diagnosis not present

## 2022-01-02 DIAGNOSIS — Z79899 Other long term (current) drug therapy: Secondary | ICD-10-CM | POA: Diagnosis not present

## 2022-01-02 DIAGNOSIS — J441 Chronic obstructive pulmonary disease with (acute) exacerbation: Secondary | ICD-10-CM | POA: Diagnosis not present

## 2022-01-02 DIAGNOSIS — N179 Acute kidney failure, unspecified: Secondary | ICD-10-CM | POA: Diagnosis not present

## 2022-01-02 DIAGNOSIS — N1832 Chronic kidney disease, stage 3b: Secondary | ICD-10-CM | POA: Diagnosis not present

## 2022-01-02 DIAGNOSIS — Z87891 Personal history of nicotine dependence: Secondary | ICD-10-CM | POA: Diagnosis not present

## 2022-01-02 DIAGNOSIS — J1282 Pneumonia due to coronavirus disease 2019: Secondary | ICD-10-CM | POA: Diagnosis not present

## 2022-01-02 DIAGNOSIS — E1165 Type 2 diabetes mellitus with hyperglycemia: Secondary | ICD-10-CM | POA: Diagnosis not present

## 2022-01-02 DIAGNOSIS — E785 Hyperlipidemia, unspecified: Secondary | ICD-10-CM | POA: Diagnosis not present

## 2022-01-02 DIAGNOSIS — I130 Hypertensive heart and chronic kidney disease with heart failure and stage 1 through stage 4 chronic kidney disease, or unspecified chronic kidney disease: Secondary | ICD-10-CM | POA: Diagnosis not present

## 2022-01-02 DIAGNOSIS — J440 Chronic obstructive pulmonary disease with acute lower respiratory infection: Secondary | ICD-10-CM | POA: Diagnosis not present

## 2022-01-07 DIAGNOSIS — R911 Solitary pulmonary nodule: Secondary | ICD-10-CM | POA: Diagnosis not present

## 2022-01-07 DIAGNOSIS — E785 Hyperlipidemia, unspecified: Secondary | ICD-10-CM | POA: Diagnosis not present

## 2022-01-07 DIAGNOSIS — E1165 Type 2 diabetes mellitus with hyperglycemia: Secondary | ICD-10-CM | POA: Diagnosis not present

## 2022-01-07 DIAGNOSIS — Z7982 Long term (current) use of aspirin: Secondary | ICD-10-CM | POA: Diagnosis not present

## 2022-01-07 DIAGNOSIS — N179 Acute kidney failure, unspecified: Secondary | ICD-10-CM | POA: Diagnosis not present

## 2022-01-07 DIAGNOSIS — I130 Hypertensive heart and chronic kidney disease with heart failure and stage 1 through stage 4 chronic kidney disease, or unspecified chronic kidney disease: Secondary | ICD-10-CM | POA: Diagnosis not present

## 2022-01-07 DIAGNOSIS — G8929 Other chronic pain: Secondary | ICD-10-CM | POA: Diagnosis not present

## 2022-01-07 DIAGNOSIS — M545 Low back pain, unspecified: Secondary | ICD-10-CM | POA: Diagnosis not present

## 2022-01-07 DIAGNOSIS — Z79899 Other long term (current) drug therapy: Secondary | ICD-10-CM | POA: Diagnosis not present

## 2022-01-07 DIAGNOSIS — F32A Depression, unspecified: Secondary | ICD-10-CM | POA: Diagnosis not present

## 2022-01-07 DIAGNOSIS — M109 Gout, unspecified: Secondary | ICD-10-CM | POA: Diagnosis not present

## 2022-01-07 DIAGNOSIS — Z87891 Personal history of nicotine dependence: Secondary | ICD-10-CM | POA: Diagnosis not present

## 2022-01-07 DIAGNOSIS — J309 Allergic rhinitis, unspecified: Secondary | ICD-10-CM | POA: Diagnosis not present

## 2022-01-07 DIAGNOSIS — J440 Chronic obstructive pulmonary disease with acute lower respiratory infection: Secondary | ICD-10-CM | POA: Diagnosis not present

## 2022-01-07 DIAGNOSIS — J1282 Pneumonia due to coronavirus disease 2019: Secondary | ICD-10-CM | POA: Diagnosis not present

## 2022-01-07 DIAGNOSIS — Z7984 Long term (current) use of oral hypoglycemic drugs: Secondary | ICD-10-CM | POA: Diagnosis not present

## 2022-01-07 DIAGNOSIS — D631 Anemia in chronic kidney disease: Secondary | ICD-10-CM | POA: Diagnosis not present

## 2022-01-07 DIAGNOSIS — J690 Pneumonitis due to inhalation of food and vomit: Secondary | ICD-10-CM | POA: Diagnosis not present

## 2022-01-07 DIAGNOSIS — N1832 Chronic kidney disease, stage 3b: Secondary | ICD-10-CM | POA: Diagnosis not present

## 2022-01-07 DIAGNOSIS — I5032 Chronic diastolic (congestive) heart failure: Secondary | ICD-10-CM | POA: Diagnosis not present

## 2022-01-07 DIAGNOSIS — J441 Chronic obstructive pulmonary disease with (acute) exacerbation: Secondary | ICD-10-CM | POA: Diagnosis not present

## 2022-01-07 DIAGNOSIS — E1122 Type 2 diabetes mellitus with diabetic chronic kidney disease: Secondary | ICD-10-CM | POA: Diagnosis not present

## 2022-01-07 DIAGNOSIS — U071 COVID-19: Secondary | ICD-10-CM | POA: Diagnosis not present

## 2022-01-08 DIAGNOSIS — E1122 Type 2 diabetes mellitus with diabetic chronic kidney disease: Secondary | ICD-10-CM | POA: Diagnosis not present

## 2022-01-08 DIAGNOSIS — I5032 Chronic diastolic (congestive) heart failure: Secondary | ICD-10-CM | POA: Diagnosis not present

## 2022-01-08 DIAGNOSIS — U071 COVID-19: Secondary | ICD-10-CM | POA: Diagnosis not present

## 2022-01-08 DIAGNOSIS — J440 Chronic obstructive pulmonary disease with acute lower respiratory infection: Secondary | ICD-10-CM | POA: Diagnosis not present

## 2022-01-08 DIAGNOSIS — Z79899 Other long term (current) drug therapy: Secondary | ICD-10-CM | POA: Diagnosis not present

## 2022-01-08 DIAGNOSIS — J1282 Pneumonia due to coronavirus disease 2019: Secondary | ICD-10-CM | POA: Diagnosis not present

## 2022-01-08 DIAGNOSIS — M545 Low back pain, unspecified: Secondary | ICD-10-CM | POA: Diagnosis not present

## 2022-01-08 DIAGNOSIS — G8929 Other chronic pain: Secondary | ICD-10-CM | POA: Diagnosis not present

## 2022-01-08 DIAGNOSIS — N1832 Chronic kidney disease, stage 3b: Secondary | ICD-10-CM | POA: Diagnosis not present

## 2022-01-08 DIAGNOSIS — D631 Anemia in chronic kidney disease: Secondary | ICD-10-CM | POA: Diagnosis not present

## 2022-01-08 DIAGNOSIS — R911 Solitary pulmonary nodule: Secondary | ICD-10-CM | POA: Diagnosis not present

## 2022-01-08 DIAGNOSIS — Z7984 Long term (current) use of oral hypoglycemic drugs: Secondary | ICD-10-CM | POA: Diagnosis not present

## 2022-01-08 DIAGNOSIS — Z7982 Long term (current) use of aspirin: Secondary | ICD-10-CM | POA: Diagnosis not present

## 2022-01-08 DIAGNOSIS — J690 Pneumonitis due to inhalation of food and vomit: Secondary | ICD-10-CM | POA: Diagnosis not present

## 2022-01-08 DIAGNOSIS — J309 Allergic rhinitis, unspecified: Secondary | ICD-10-CM | POA: Diagnosis not present

## 2022-01-08 DIAGNOSIS — N179 Acute kidney failure, unspecified: Secondary | ICD-10-CM | POA: Diagnosis not present

## 2022-01-08 DIAGNOSIS — Z87891 Personal history of nicotine dependence: Secondary | ICD-10-CM | POA: Diagnosis not present

## 2022-01-08 DIAGNOSIS — I130 Hypertensive heart and chronic kidney disease with heart failure and stage 1 through stage 4 chronic kidney disease, or unspecified chronic kidney disease: Secondary | ICD-10-CM | POA: Diagnosis not present

## 2022-01-08 DIAGNOSIS — M109 Gout, unspecified: Secondary | ICD-10-CM | POA: Diagnosis not present

## 2022-01-08 DIAGNOSIS — F32A Depression, unspecified: Secondary | ICD-10-CM | POA: Diagnosis not present

## 2022-01-08 DIAGNOSIS — J441 Chronic obstructive pulmonary disease with (acute) exacerbation: Secondary | ICD-10-CM | POA: Diagnosis not present

## 2022-01-08 DIAGNOSIS — E1165 Type 2 diabetes mellitus with hyperglycemia: Secondary | ICD-10-CM | POA: Diagnosis not present

## 2022-01-08 DIAGNOSIS — E785 Hyperlipidemia, unspecified: Secondary | ICD-10-CM | POA: Diagnosis not present

## 2022-01-09 ENCOUNTER — Encounter: Payer: Self-pay | Admitting: Family

## 2022-01-09 ENCOUNTER — Ambulatory Visit (INDEPENDENT_AMBULATORY_CARE_PROVIDER_SITE_OTHER): Payer: Medicare Other | Admitting: Family

## 2022-01-09 VITALS — BP 98/64 | HR 51 | Temp 97.0°F | Ht 68.0 in | Wt 281.0 lb

## 2022-01-09 DIAGNOSIS — J441 Chronic obstructive pulmonary disease with (acute) exacerbation: Secondary | ICD-10-CM

## 2022-01-09 DIAGNOSIS — M549 Dorsalgia, unspecified: Secondary | ICD-10-CM | POA: Diagnosis not present

## 2022-01-09 DIAGNOSIS — N1832 Chronic kidney disease, stage 3b: Secondary | ICD-10-CM | POA: Diagnosis not present

## 2022-01-09 DIAGNOSIS — G8929 Other chronic pain: Secondary | ICD-10-CM

## 2022-01-09 DIAGNOSIS — I5032 Chronic diastolic (congestive) heart failure: Secondary | ICD-10-CM

## 2022-01-09 DIAGNOSIS — M5136 Other intervertebral disc degeneration, lumbar region: Secondary | ICD-10-CM

## 2022-01-09 DIAGNOSIS — I10 Essential (primary) hypertension: Secondary | ICD-10-CM

## 2022-01-09 DIAGNOSIS — E785 Hyperlipidemia, unspecified: Secondary | ICD-10-CM | POA: Diagnosis not present

## 2022-01-09 DIAGNOSIS — Z23 Encounter for immunization: Secondary | ICD-10-CM

## 2022-01-09 DIAGNOSIS — D649 Anemia, unspecified: Secondary | ICD-10-CM

## 2022-01-09 DIAGNOSIS — M51369 Other intervertebral disc degeneration, lumbar region without mention of lumbar back pain or lower extremity pain: Secondary | ICD-10-CM

## 2022-01-09 DIAGNOSIS — F419 Anxiety disorder, unspecified: Secondary | ICD-10-CM

## 2022-01-09 DIAGNOSIS — M1712 Unilateral primary osteoarthritis, left knee: Secondary | ICD-10-CM | POA: Diagnosis not present

## 2022-01-09 DIAGNOSIS — J9601 Acute respiratory failure with hypoxia: Secondary | ICD-10-CM

## 2022-01-09 DIAGNOSIS — E1142 Type 2 diabetes mellitus with diabetic polyneuropathy: Secondary | ICD-10-CM

## 2022-01-09 DIAGNOSIS — I959 Hypotension, unspecified: Secondary | ICD-10-CM

## 2022-01-09 MED ORDER — VERAPAMIL HCL ER 120 MG PO TBCR: 120.0000 mg | EXTENDED_RELEASE_TABLET | Freq: Every day | ORAL | 1 refills | Status: DC

## 2022-01-09 MED ORDER — GLIMEPIRIDE 1 MG PO TABS: 1.0000 mg | ORAL_TABLET | Freq: Every day | ORAL | 0 refills | Status: DC

## 2022-01-09 NOTE — Progress Notes (Signed)
Subjective:    Patient ID: Marcus Carr, male    DOB: [birth year], 81 y.o.   MRN: [id number]  Chief Complaint  Patient presents with   Follow-up    Flu shot today    Pt presents to the office today for chronic follow up. PT has history of osteomyelitis of mandible, doing stable at this time. Pt is followed by Pain Management every 3 months for chronic back pain.    He has CKD and tries to limit NSAID's. He is morbid obese with a BMI of 42.  He was recently hospitalized for COVID and CAP. He is on room air today with O2 sat of 93%. He has O2 at home he has been using as needed. He had a repeat chest x-ray last week that was negative.   He has COPD and reports mild SOB with exertion. He quit smoking 2015.   He is hypotensive today.  Hypertension This is a chronic problem. The current episode started more than 1 year ago. The problem has been resolved since onset. The problem is controlled. Associated symptoms include anxiety, malaise/fatigue, peripheral edema (improving) and shortness of breath. Pertinent negatives include no blurred vision. Risk factors for coronary artery disease include dyslipidemia, obesity, male gender and sedentary lifestyle. The current treatment provides moderate improvement. Hypertensive end-organ damage includes heart failure.  Congestive Heart Failure Presents for follow-up visit. Associated symptoms include edema, fatigue and shortness of breath. The symptoms have been stable.  Arthritis Presents for follow-up visit. He complains of pain and stiffness. Affected locations include the left knee, right knee, left MCP and right MCP. His pain is at a severity of 4/10. Associated symptoms include fatigue.  Hyperlipidemia This is a chronic problem. The current episode started more than 1 year ago. The problem is controlled. Recent lipid tests were reviewed and are normal. Exacerbating diseases include obesity. Associated symptoms include shortness of breath.  Current antihyperlipidemic treatment includes statins. The current treatment provides moderate improvement of lipids. Risk factors for coronary artery disease include dyslipidemia, diabetes mellitus, hypertension, male sex, a sedentary lifestyle and post-menopausal.  Back Pain This is a chronic problem. The current episode started more than 1 year ago. The problem occurs intermittently. The pain is present in the lumbar spine.  Anxiety Presents for follow-up visit. Symptoms include depressed mood, excessive worry, irritability, nervous/anxious behavior and shortness of breath.    Diabetes He presents for his follow-up diabetic visit. He has type 2 diabetes mellitus. Hypoglycemia symptoms include nervousness/anxiousness. Associated symptoms include fatigue and foot paresthesias. Pertinent negatives for diabetes include no blurred vision. Diabetic complications include peripheral neuropathy. Risk factors for coronary artery disease include male sex, hypertension, sedentary lifestyle, dyslipidemia and diabetes mellitus. He is following a generally healthy diet.      Review of Systems  Constitutional:  Positive for fatigue, irritability and malaise/fatigue.  Eyes:  Negative for blurred vision.  Respiratory:  Positive for shortness of breath.   Musculoskeletal:  Positive for arthritis, back pain and stiffness.  Psychiatric/Behavioral:  The patient is nervous/anxious.   All other systems reviewed and are negative.      Objective:   Physical Exam Vitals reviewed.  Constitutional:      General: He is not in acute distress.    Appearance: He is well-developed. He is obese.  HENT:     Head: Normocephalic.     Right Ear: Tympanic membrane normal.     Left Ear: Tympanic membrane normal.  Eyes:     General:  Right eye: No discharge.        Left eye: No discharge.     Pupils: Pupils are equal, round, and reactive to light.  Neck:     Thyroid: No thyromegaly.  Cardiovascular:     Rate  and Rhythm: Normal rate and regular rhythm.     Heart sounds: Normal heart sounds. No murmur heard. Pulmonary:     Effort: Pulmonary effort is normal. No respiratory distress.     Breath sounds: Normal breath sounds. No wheezing.  Abdominal:     General: Bowel sounds are normal. There is no distension.     Palpations: Abdomen is soft.     Tenderness: There is no abdominal tenderness.  Musculoskeletal:        General: No tenderness. Normal range of motion.     Cervical back: Normal range of motion and neck supple.     Right lower leg: Edema (trace) present.     Left lower leg: Edema (trace) present.  Skin:    General: Skin is warm and dry.     Findings: No erythema or rash.  Neurological:     Mental Status: He is alert and oriented to person, place, and time.     Cranial Nerves: No cranial nerve deficit.     Deep Tendon Reflexes: Reflexes are normal and symmetric.  Psychiatric:        Behavior: Behavior normal.        Thought Content: Thought content normal.        Judgment: Judgment normal.       BP 98/64   Pulse (!) 51   Temp (!) 97 F (36.1 C) (Temporal)   Ht _0  (1.727 m)   Wt 281 lb (127.5 kg)   SpO2 94%   BMI 42.73 kg/m      Assessment & Plan:  Marcus Carr comes in today with chief complaint of Follow-up (Flu shot today )   Diagnosis and orders addressed:  1. Need for immunization against influenza - Flu Vaccine QUAD High Dose(Fluad) - CMP14+EGFR  2. Primary hypertension - verapamil (CALAN-SR) 120 MG CR tablet; Take 1 tablet (120 mg total) by mouth at bedtime.  Dispense: 90 tablet; Refill: 1 - CMP14+EGFR  3. Chronic diastolic CHF (congestive heart failure) (HCC)  - CMP14+EGFR  4. COPD with acute exacerbation (HCC) - CMP14+EGFR  5. Acute anemia - CMP14+EGFR  6. Anxiety - CMP14+EGFR  7. Chronic bilateral back pain, unspecified back location - CMP14+EGFR  8. Morbid obesity ([place])  - CMP14+EGFR  9. Hyperlipidemia, unspecified  hyperlipidemia type - CMP14+EGFR  10. Stage 3b chronic kidney disease ([place]) - CMP14+EGFR  11. Primary osteoarthritis of left knee - CMP14+EGFR  12. Degeneration of lumbar intervertebral disc  - CMP14+EGFR  13. Acute respiratory failure with hypoxia (HCC)  - CMP14+EGFR  14. Acute hypotension Decrease verapamil to 120 mg from 180 mg Follow up in 2 weeks  - CMP14+EGFR  15. Type 2 diabetes mellitus with diabetic polyneuropathy, without long-term current use of insulin (HCC) - glimepiride (AMARYL) 1 MG tablet; Take 1 tablet (1 mg total) by mouth daily with breakfast.  Dispense: 30 tablet; Refill: 0 - CMP14+EGFR - Microalbumin / creatinine urine ratio   Labs pending Decrease verapamil to 120 mg from 180 mg Health Maintenance reviewed Diet and exercise encouraged  Follow up plan: 2 weeks to recheck Hypotension   [name], FNP

## 2022-01-09 NOTE — Patient Instructions (Signed)
Hypotension As the heart beats, it forces blood through the body. Hypotension, commonly called low blood pressure, is when the force of blood pumping through the arteries is too weak. Arteries are blood vessels that carry blood from the heart throughout the body. Depending on the cause and severity, hypotension may be harmless (benign) or may cause serious problems (be critical). When your blood pressure is too low, you may not get enough blood to your brain or to the rest of your organs. This can cause weakness, light-headedness, a rapid heartbeat, and fainting. What are the causes? This condition may be caused by: Blood loss. Loss of body fluids (dehydration). Heart problems. Hormone (endocrine) problems. Pregnancy. Severe infection. Lack of certain nutrients. Severe allergic reactions (anaphylaxis). Certain medicines, such as blood pressure medicine or medicines that make the body lose excess fluids (diuretics). Sometimes, hypotension may be caused by not taking medicine as directed, such as taking too much of a certain medicine. What increases the risk? The following factors may make you more likely to develop this condition: Age. Risk increases as you get older. Having a condition that affects the heart or the central nervous system. What are the signs or symptoms? Common symptoms of this condition include: Weakness. Light-headedness. Dizziness. Blurred vision. Tiredness (fatigue). Rapid heartbeat. Fainting, in severe cases. How is this diagnosed? This condition is diagnosed based on: Your medical history. Your symptoms. Your blood pressure measurement. Your health care provider will check your blood pressure when you are: Lying down. Sitting. Standing. A blood pressure reading is recorded as two numbers, such as "120 over 80" (or 120/80). The first ("top") number is called the systolic pressure. It is a measure of the pressure in your arteries as your heart beats. The second  ("bottom") number is called the diastolic pressure. It is a measure of the pressure in your arteries when your heart relaxes between beats. Blood pressure is measured in a unit called mm Hg. Healthy blood pressure for most adults is 120/80. If your blood pressure is below 90/60, you may be diagnosed with hypotension. Other information or tests that may be used to diagnose hypotension include: Your other vital signs, such as your heart rate and temperature. Blood tests. Tilt table test. For this test, you will be safely secured to a table that moves you from a lying position to an upright position. Your heart rhythm and blood pressure will be monitored during the test. How is this treated? Treatment for this condition may include: Changing your diet. This may involve drinking more water or increasing your salt (sodium) intake with high-sodium foods. Taking medicines to raise your blood pressure. Changing the dosage of certain medicines you are taking that might be lowering your blood pressure. Wearing compression stockings. These stockings help to prevent blood clots and reduce swelling in your legs. In some cases, you may need to go to the hospital for: Fluid replacement. This means you will receive fluids through an IV. Blood replacement. This means you will receive donated blood through an IV (transfusion). Treating an infection or heart problems, if this applies. Monitoring. You may need to be monitored while medicines that you are taking wear off. Follow these instructions at home: Eating and drinking  Drink enough fluid to keep your urine pale yellow. Eat a healthy diet, and follow instructions from your health care provider about eating or drinking restrictions. A healthy diet includes: Fresh fruits and vegetables. Whole grains. Lean meats. Low-fat dairy products. Increase your salt intake if told   to do so. Do not add extra salt to your diet unless your health care provider tells you  to do that. Eat frequent, small meals. Avoid standing up suddenly after eating. Medicines Take over-the-counter and prescription medicines only as told by your health care provider. Follow instructions from your health care provider about changing the dosage of your current medicines, if this applies. Do not stop or adjust any of your medicines on your own. General instructions  Wear compression stockings as told by your health care provider. Get up slowly from lying down or sitting positions. This gives your blood pressure a chance to adjust. Avoid hot showers and excessive heat as directed by your health care provider. Return to your normal activities as told by your health care provider. Ask your health care provider what activities are safe for you. Do not use any products that contain nicotine or tobacco. These products include cigarettes, chewing tobacco, and vaping devices, such as e-cigarettes. If you need help quitting, ask your health care provider. Keep all follow-up visits. This is important. Contact a health care provider if: You vomit. You have diarrhea. You have a fever for more than 2-3 days. You feel more thirsty than usual. You feel weak and tired. Get help right away if: You have chest pain. You have a fast or irregular heartbeat. You develop numbness in any part of your body. You cannot move your arms or your legs. You have trouble speaking. You become sweaty or feel light-headed. You faint. You feel short of breath. You have trouble staying awake. You feel confused. These symptoms may be an emergency. Get help right away. Call 911. Do not wait to see if the symptoms will go away. Do not drive yourself to the hospital. Summary Hypotension is when the force of blood pumping through the arteries is too weak. Hypotension may be harmless (benign) or may cause serious problems (be critical). Treatment for this condition may include changing your diet, changing  your medicines, and wearing compression stockings. In some cases, you may need to go to the hospital for fluid or blood replacement. This information is not intended to replace advice given to you by your health care provider. Make sure you discuss any questions you have with your health care provider. Document Revised: 10/29/2020 Document Reviewed: 10/29/2020 Elsevier Patient Education  2023 Elsevier Inc.  

## 2022-01-10 DIAGNOSIS — Z7984 Long term (current) use of oral hypoglycemic drugs: Secondary | ICD-10-CM | POA: Diagnosis not present

## 2022-01-10 DIAGNOSIS — M545 Low back pain, unspecified: Secondary | ICD-10-CM | POA: Diagnosis not present

## 2022-01-10 DIAGNOSIS — D631 Anemia in chronic kidney disease: Secondary | ICD-10-CM | POA: Diagnosis not present

## 2022-01-10 DIAGNOSIS — J309 Allergic rhinitis, unspecified: Secondary | ICD-10-CM | POA: Diagnosis not present

## 2022-01-10 DIAGNOSIS — N179 Acute kidney failure, unspecified: Secondary | ICD-10-CM | POA: Diagnosis not present

## 2022-01-10 DIAGNOSIS — E1122 Type 2 diabetes mellitus with diabetic chronic kidney disease: Secondary | ICD-10-CM | POA: Diagnosis not present

## 2022-01-10 DIAGNOSIS — Z79899 Other long term (current) drug therapy: Secondary | ICD-10-CM | POA: Diagnosis not present

## 2022-01-10 DIAGNOSIS — J440 Chronic obstructive pulmonary disease with acute lower respiratory infection: Secondary | ICD-10-CM | POA: Diagnosis not present

## 2022-01-10 DIAGNOSIS — J690 Pneumonitis due to inhalation of food and vomit: Secondary | ICD-10-CM | POA: Diagnosis not present

## 2022-01-10 DIAGNOSIS — E1165 Type 2 diabetes mellitus with hyperglycemia: Secondary | ICD-10-CM | POA: Diagnosis not present

## 2022-01-10 DIAGNOSIS — Z7982 Long term (current) use of aspirin: Secondary | ICD-10-CM | POA: Diagnosis not present

## 2022-01-10 DIAGNOSIS — J441 Chronic obstructive pulmonary disease with (acute) exacerbation: Secondary | ICD-10-CM | POA: Diagnosis not present

## 2022-01-10 DIAGNOSIS — Z87891 Personal history of nicotine dependence: Secondary | ICD-10-CM | POA: Diagnosis not present

## 2022-01-10 DIAGNOSIS — U071 COVID-19: Secondary | ICD-10-CM | POA: Diagnosis not present

## 2022-01-10 DIAGNOSIS — M109 Gout, unspecified: Secondary | ICD-10-CM | POA: Diagnosis not present

## 2022-01-10 DIAGNOSIS — I5032 Chronic diastolic (congestive) heart failure: Secondary | ICD-10-CM | POA: Diagnosis not present

## 2022-01-10 DIAGNOSIS — E785 Hyperlipidemia, unspecified: Secondary | ICD-10-CM | POA: Diagnosis not present

## 2022-01-10 DIAGNOSIS — R911 Solitary pulmonary nodule: Secondary | ICD-10-CM | POA: Diagnosis not present

## 2022-01-10 DIAGNOSIS — F32A Depression, unspecified: Secondary | ICD-10-CM | POA: Diagnosis not present

## 2022-01-10 DIAGNOSIS — N1832 Chronic kidney disease, stage 3b: Secondary | ICD-10-CM | POA: Diagnosis not present

## 2022-01-10 DIAGNOSIS — G8929 Other chronic pain: Secondary | ICD-10-CM | POA: Diagnosis not present

## 2022-01-10 DIAGNOSIS — I130 Hypertensive heart and chronic kidney disease with heart failure and stage 1 through stage 4 chronic kidney disease, or unspecified chronic kidney disease: Secondary | ICD-10-CM | POA: Diagnosis not present

## 2022-01-10 DIAGNOSIS — J1282 Pneumonia due to coronavirus disease 2019: Secondary | ICD-10-CM | POA: Diagnosis not present

## 2022-01-10 LAB — CMP14+EGFR
ALT: 16 IU/L (ref 0–44)
AST: 20 IU/L (ref 0–40)
Albumin/Globulin Ratio: 2 (ref 1.2–2.2)
Albumin: 4.3 g/dL (ref 3.8–4.8)
Alkaline Phosphatase: 73 IU/L (ref 44–121)
BUN/Creatinine Ratio: 18 (ref 10–24)
BUN: 25 mg/dL (ref 8–27)
Bilirubin Total: 0.4 mg/dL (ref 0.0–1.2)
CO2: 25 mmol/L (ref 20–29)
Calcium: 9.4 mg/dL (ref 8.6–10.2)
Chloride: 100 mmol/L (ref 96–106)
Creatinine, Ser: 1.41 mg/dL — ABNORMAL HIGH (ref 0.76–1.27)
Globulin, Total: 2.1 g/dL (ref 1.5–4.5)
Glucose: 69 mg/dL — ABNORMAL LOW (ref 70–99)
Potassium: 4.7 mmol/L (ref 3.5–5.2)
Sodium: 140 mmol/L (ref 134–144)
Total Protein: 6.4 g/dL (ref 6.0–8.5)
eGFR: 51 mL/min/{1.73_m2} — ABNORMAL LOW (ref >59)

## 2022-01-10 LAB — MICROALBUMIN / CREATININE URINE RATIO
Creatinine, Urine: 135 mg/dL
Microalb/Creat Ratio: 20 mg/g creat (ref 0–29)
Microalbumin, Urine: 27 ug/mL

## 2022-01-14 DIAGNOSIS — Z87891 Personal history of nicotine dependence: Secondary | ICD-10-CM | POA: Diagnosis not present

## 2022-01-14 DIAGNOSIS — M545 Low back pain, unspecified: Secondary | ICD-10-CM | POA: Diagnosis not present

## 2022-01-14 DIAGNOSIS — N1832 Chronic kidney disease, stage 3b: Secondary | ICD-10-CM | POA: Diagnosis not present

## 2022-01-14 DIAGNOSIS — J309 Allergic rhinitis, unspecified: Secondary | ICD-10-CM | POA: Diagnosis not present

## 2022-01-14 DIAGNOSIS — F32A Depression, unspecified: Secondary | ICD-10-CM | POA: Diagnosis not present

## 2022-01-14 DIAGNOSIS — G8929 Other chronic pain: Secondary | ICD-10-CM | POA: Diagnosis not present

## 2022-01-14 DIAGNOSIS — D631 Anemia in chronic kidney disease: Secondary | ICD-10-CM | POA: Diagnosis not present

## 2022-01-14 DIAGNOSIS — J1282 Pneumonia due to coronavirus disease 2019: Secondary | ICD-10-CM | POA: Diagnosis not present

## 2022-01-14 DIAGNOSIS — I130 Hypertensive heart and chronic kidney disease with heart failure and stage 1 through stage 4 chronic kidney disease, or unspecified chronic kidney disease: Secondary | ICD-10-CM | POA: Diagnosis not present

## 2022-01-14 DIAGNOSIS — J690 Pneumonitis due to inhalation of food and vomit: Secondary | ICD-10-CM | POA: Diagnosis not present

## 2022-01-14 DIAGNOSIS — E785 Hyperlipidemia, unspecified: Secondary | ICD-10-CM | POA: Diagnosis not present

## 2022-01-14 DIAGNOSIS — U071 COVID-19: Secondary | ICD-10-CM | POA: Diagnosis not present

## 2022-01-14 DIAGNOSIS — Z7982 Long term (current) use of aspirin: Secondary | ICD-10-CM | POA: Diagnosis not present

## 2022-01-14 DIAGNOSIS — E1165 Type 2 diabetes mellitus with hyperglycemia: Secondary | ICD-10-CM | POA: Diagnosis not present

## 2022-01-14 DIAGNOSIS — J441 Chronic obstructive pulmonary disease with (acute) exacerbation: Secondary | ICD-10-CM | POA: Diagnosis not present

## 2022-01-14 DIAGNOSIS — N179 Acute kidney failure, unspecified: Secondary | ICD-10-CM | POA: Diagnosis not present

## 2022-01-14 DIAGNOSIS — Z79899 Other long term (current) drug therapy: Secondary | ICD-10-CM | POA: Diagnosis not present

## 2022-01-14 DIAGNOSIS — J440 Chronic obstructive pulmonary disease with acute lower respiratory infection: Secondary | ICD-10-CM | POA: Diagnosis not present

## 2022-01-14 DIAGNOSIS — E1122 Type 2 diabetes mellitus with diabetic chronic kidney disease: Secondary | ICD-10-CM | POA: Diagnosis not present

## 2022-01-14 DIAGNOSIS — R911 Solitary pulmonary nodule: Secondary | ICD-10-CM | POA: Diagnosis not present

## 2022-01-14 DIAGNOSIS — I5032 Chronic diastolic (congestive) heart failure: Secondary | ICD-10-CM | POA: Diagnosis not present

## 2022-01-14 DIAGNOSIS — M109 Gout, unspecified: Secondary | ICD-10-CM | POA: Diagnosis not present

## 2022-01-14 DIAGNOSIS — Z7984 Long term (current) use of oral hypoglycemic drugs: Secondary | ICD-10-CM | POA: Diagnosis not present

## 2022-01-15 ENCOUNTER — Other Ambulatory Visit: Payer: Self-pay | Admitting: Family

## 2022-01-15 DIAGNOSIS — M545 Low back pain, unspecified: Secondary | ICD-10-CM | POA: Diagnosis not present

## 2022-01-15 DIAGNOSIS — Z7982 Long term (current) use of aspirin: Secondary | ICD-10-CM | POA: Diagnosis not present

## 2022-01-15 DIAGNOSIS — Z79899 Other long term (current) drug therapy: Secondary | ICD-10-CM | POA: Diagnosis not present

## 2022-01-15 DIAGNOSIS — J309 Allergic rhinitis, unspecified: Secondary | ICD-10-CM | POA: Diagnosis not present

## 2022-01-15 DIAGNOSIS — M109 Gout, unspecified: Secondary | ICD-10-CM | POA: Diagnosis not present

## 2022-01-15 DIAGNOSIS — I5032 Chronic diastolic (congestive) heart failure: Secondary | ICD-10-CM | POA: Diagnosis not present

## 2022-01-15 DIAGNOSIS — E1122 Type 2 diabetes mellitus with diabetic chronic kidney disease: Secondary | ICD-10-CM | POA: Diagnosis not present

## 2022-01-15 DIAGNOSIS — N179 Acute kidney failure, unspecified: Secondary | ICD-10-CM | POA: Diagnosis not present

## 2022-01-15 DIAGNOSIS — J1282 Pneumonia due to coronavirus disease 2019: Secondary | ICD-10-CM | POA: Diagnosis not present

## 2022-01-15 DIAGNOSIS — G8929 Other chronic pain: Secondary | ICD-10-CM | POA: Diagnosis not present

## 2022-01-15 DIAGNOSIS — N1832 Chronic kidney disease, stage 3b: Secondary | ICD-10-CM | POA: Diagnosis not present

## 2022-01-15 DIAGNOSIS — D631 Anemia in chronic kidney disease: Secondary | ICD-10-CM | POA: Diagnosis not present

## 2022-01-15 DIAGNOSIS — I130 Hypertensive heart and chronic kidney disease with heart failure and stage 1 through stage 4 chronic kidney disease, or unspecified chronic kidney disease: Secondary | ICD-10-CM | POA: Diagnosis not present

## 2022-01-15 DIAGNOSIS — J440 Chronic obstructive pulmonary disease with acute lower respiratory infection: Secondary | ICD-10-CM | POA: Diagnosis not present

## 2022-01-15 DIAGNOSIS — R911 Solitary pulmonary nodule: Secondary | ICD-10-CM | POA: Diagnosis not present

## 2022-01-15 DIAGNOSIS — U071 COVID-19: Secondary | ICD-10-CM | POA: Diagnosis not present

## 2022-01-15 DIAGNOSIS — Z7984 Long term (current) use of oral hypoglycemic drugs: Secondary | ICD-10-CM | POA: Diagnosis not present

## 2022-01-15 DIAGNOSIS — E785 Hyperlipidemia, unspecified: Secondary | ICD-10-CM | POA: Diagnosis not present

## 2022-01-15 DIAGNOSIS — J690 Pneumonitis due to inhalation of food and vomit: Secondary | ICD-10-CM | POA: Diagnosis not present

## 2022-01-15 DIAGNOSIS — F32A Depression, unspecified: Secondary | ICD-10-CM | POA: Diagnosis not present

## 2022-01-15 DIAGNOSIS — Z87891 Personal history of nicotine dependence: Secondary | ICD-10-CM | POA: Diagnosis not present

## 2022-01-15 DIAGNOSIS — J441 Chronic obstructive pulmonary disease with (acute) exacerbation: Secondary | ICD-10-CM | POA: Diagnosis not present

## 2022-01-15 DIAGNOSIS — E1165 Type 2 diabetes mellitus with hyperglycemia: Secondary | ICD-10-CM | POA: Diagnosis not present

## 2022-01-16 ENCOUNTER — Ambulatory Visit (INDEPENDENT_AMBULATORY_CARE_PROVIDER_SITE_OTHER): Payer: Medicare Other

## 2022-01-16 DIAGNOSIS — Z0000 Encounter for general adult medical examination without abnormal findings: Secondary | ICD-10-CM | POA: Diagnosis not present

## 2022-01-16 NOTE — Progress Notes (Signed)
Subjective:   Marcus Carr is a 81 y.o. male who presents for Medicare Annual/Subsequent preventive examination.  I connected with  Marcus Carr on 01/16/22 by a audio enabled telemedicine application and verified that I am speaking with the correct person using two identifiers.  Patient Location: Home  Provider Location: Office/Clinic  I discussed the limitations of evaluation and management by telemedicine. The patient expressed understanding and agreed to proceed.   Review of Systems    Defer to PCP Cardiac Risk Factors include: advanced age (>8mn, >>50women);dyslipidemia;hypertension;male gender;smoking/ tobacco exposure;sedentary lifestyle     Objective:    Today's Vitals   01/16/22 1019  PainSc: 6    There is no height or weight on file to calculate BMI.     01/16/2022   10:30 AM 11/19/2021    5:20 PM 11/15/2020    1:21 PM 11/15/2019   11:51 AM 11/08/2018    1:37 PM 07/15/2017   11:17 AM 07/08/2017   12:10 PM  Advanced Directives  Does Patient Have a Medical Advance Directive? No No No No No No No  Would patient like information on creating a medical advance directive? No - Patient declined No - Patient declined No - Patient declined No - Patient declined Yes (MAU/Ambulatory/Procedural Areas - Information given) Yes (MAU/Ambulatory/Procedural Areas - Information given) Yes (MAU/Ambulatory/Procedural Areas - Information given)    Current Medications (verified) Outpatient Encounter Medications as of 01/16/2022  Medication Sig   ACCU-CHEK GUIDE test strip USE UP TO FOUR TIMES DAILY AS DIRECTED DX E88.81   Accu-Chek Softclix Lancets lancets Use as instructed   albuterol (VENTOLIN HFA) 108 (90 Base) MCG/ACT inhaler INHALE 2 PUFFS INTO THE LUNGS EVERY 6 HOURS AS NEEDED FOR WHEEZE OR SHORTNESS OF BREATH (Patient taking differently: Inhale 2 puffs into the lungs every 6 (six) hours as needed for wheezing or shortness of breath. INHALE 2 PUFFS INTO THE LUNGS EVERY  6 HOURS AS NEEDED FOR WHEEZE OR SHORTNESS OF BREATH)   Albuterol Sulfate (PROAIR RESPICLICK) 1314(90 Base) MCG/ACT AEPB Inhale 1-2 puffs into the lungs every 6 (six) weeks. prn   aspirin EC 81 MG tablet Take 81 mg by mouth daily. Swallow whole.   atorvastatin (LIPITOR) 20 MG tablet TAKE 1 TABLET BY MOUTH EVERY DAY   blood glucose meter kit and supplies KIT Dispense based on patient and insurance preference. Use up to four times daily as directed.   cetirizine (ZYRTEC) 10 MG tablet Take 1 tablet (10 mg total) by mouth daily.   cholecalciferol (VITAMIN D) 400 UNITS TABS tablet Take 800 Units by mouth daily.   citalopram (CELEXA) 20 MG tablet TAKE 1 TABLET BY MOUTH EVERY DAY   diclofenac sodium (VOLTAREN) 1 % GEL Apply 4 g topically 4 (four) times daily. To left knee for pain   fish oil-omega-3 fatty acids 1000 MG capsule Take 1 g by mouth daily.   fluticasone (FLONASE) 50 MCG/ACT nasal spray SPRAY 2 SPRAYS INTO EACH NOSTRIL EVERY DAY   furosemide (LASIX) 40 MG tablet TAKE 1 TABLET BY MOUTH EVERY DAY   gabapentin (NEURONTIN) 100 MG capsule Take 1 capsule (100 mg total) by mouth 3 (three) times daily.   glimepiride (AMARYL) 1 MG tablet Take 1 tablet (1 mg total) by mouth daily with breakfast.   oxyCODONE-acetaminophen (PERCOCET) 10-325 MG per tablet Take 1 tablet by mouth every 12 (twelve) hours as needed. (Patient taking differently: Take 1 tablet by mouth every 12 (twelve) hours as needed for pain.)  Specialty Vitamins Products (MAGNESIUM, AMINO ACID CHELATE,) 133 MG tablet Take 1 tablet by mouth daily.   verapamil (CALAN-SR) 120 MG CR tablet Take 1 tablet (120 mg total) by mouth at bedtime.   No facility-administered encounter medications on file as of 01/16/2022.    Allergies (verified) Penicillins   History: Past Medical History:  Diagnosis Date   Abscess, jaw    Anxiety    Back pain    chronic back pain treated by Dr. Nelva Bush   Chronic kidney disease    Chronic mastoiditis of both  sides 11/07/2014   Cigarette smoker 11/07/2014   COPD (chronic obstructive pulmonary disease) ([place])    Gout    Hyperlipidemia    Hypertension    Morbid obesity ([place]) 11/07/2014   Osteomyelitis of mandible 11/07/2014   Thyroid nodule 11/07/2014   Past Surgical History:  Procedure Laterality Date   APPENDECTOMY     TONSILLECTOMY     Family History  Problem Relation Age of Onset   Cancer Mother        esophageal   Heart attack Father 32   Social History   Socioeconomic History   Marital status: Married    Spouse name: Rosa   Number of children: 2   Years of education: 8   Highest education level: 8th grade  Occupational History   Occupation: Retired  Tobacco Use   Smoking status: Former    Packs/day: 1.50    Years: 50.00    Total pack years: 75.00    Types: Cigarettes    Quit date: 10/24/2014    Years since quitting: 7.2   Smokeless tobacco: Never  Vaping Use   Vaping Use: Never used  Substance and Sexual Activity   Alcohol use: No    Alcohol/week: 0.0 standard drinks of alcohol   Drug use: No   Sexual activity: Not Currently  Other Topics Concern   Not on file  Social History Narrative   Lives wife and grandson stays with them a lot.  Two daughters - both live within 58 miles   Social Determinants of Health   Financial Resource Strain: Low Risk  (01/16/2022)   Overall Financial Resource Strain (CARDIA)    Difficulty of Paying Living Expenses: Not hard at all  Food Insecurity: No Food Insecurity (01/16/2022)   Hunger Vital Sign    Worried About Running Out of Food in the Last Year: Never true    Ran Out of Food in the Last Year: Never true  Transportation Needs: No Transportation Needs (01/16/2022)   PRAPARE - Hydrologist (Medical): No    Lack of Transportation (Non-Medical): No  Physical Activity: Inactive (01/16/2022)   Exercise Vital Sign    Days of Exercise per Week: 0 days    Minutes of Exercise per Session: 0 min   Stress: No Stress Concern Present (01/16/2022)   [place]    Feeling of Stress : Not at all  Social Connections: Moderately Isolated (01/16/2022)   Social Connection and Isolation Panel [NHANES]    Frequency of Communication with Friends and Family: Three times a week    Frequency of Social Gatherings with Friends and Family: Twice a week    Attends Religious Services: Never    Marine scientist or Organizations: No    Attends Archivist Meetings: Never    Marital Status: Married     Clinical Intake:  Pre-visit preparation completed: Yes  Pain : 0-10 Pain Score: 6  Pain Type: Chronic pain Pain Location: Knee Pain Orientation: Right, Left Pain Descriptors / Indicators: Aching Pain Onset: 1 to 4 weeks ago Pain Frequency: Several days a week Pain Relieving Factors: Oxycodone  Pain Relieving Factors: Oxycodone  Nutritional Risks: None Diabetes: No  How often do you need to have someone help you when you read instructions, pamphlets, or other written materials from your doctor or pharmacy?: 1 - Never What is the last grade level you completed in school?: 8th grade  Diabetic? No  Interpreter Needed?: No  Information entered by :: Felicity Coyer LPN   Activities of Daily Living    01/16/2022   10:30 AM  In your present state of health, do you have any difficulty performing the following activities:  Hearing? 1  Comment has noticed hearing decreased with age  Vision? 0  Difficulty concentrating or making decisions? 0  Walking or climbing stairs? 1  Comment knee pain  Dressing or bathing? 0  Doing errands, shopping? 1  Preparing Food and eating ? N  Using the Toilet? N  In the past six months, have you accidently leaked urine? N  Do you have problems with loss of bowel control? N  Managing your Medications? N  Managing your Finances? N  Housekeeping or managing your Housekeeping? N     Patient Care Team: Sharion Balloon, FNP as PCP - General (Nurse Practitioner) Arnetha Courser (Inactive) as Surgeon Verlin Grills, MD as Attending Physician (Infectious Diseases)  Indicate any recent Medical Services you may have received from other than [facility] providers in the past year (date may be approximate).     Assessment:   This is a routine wellness examination for [place].  Hearing/Vision screen No results found.  Dietary issues and exercise activities discussed: Current Exercise Habits: The patient does not participate in regular exercise at present, Exercise limited by: orthopedic condition(s)   Goals Addressed             This Visit's Progress    Patient Stated       01/16/2022 AWV Goal: Fall Prevention  Over the next year, patient will decrease their risk for falls by: Using assistive devices, such as a cane or walker, as needed Identifying fall risks within their home and correcting them by: Removing throw rugs Adding handrails to stairs or ramps Removing clutter and keeping a clear pathway throughout the home Increasing light, especially at night Adding shower handles/bars Raising toilet seat Identifying potential personal risk factors for falls: Medication side effects Incontinence/urgency Vestibular dysfunction Hearing loss Musculoskeletal disorders Neurological disorders Orthostatic hypotension         Depression Screen    01/16/2022   10:29 AM 12/03/2021    4:07 PM 08/12/2021    3:46 PM 07/11/2021    4:24 PM 06/24/2021   11:01 AM 04/05/2021    3:23 PM 03/29/2021    3:52 PM  PHQ 2/9 Scores  PHQ - 2 Score 0 1 3 0 0 0 1  PHQ- 9 Score  '6 8 6  5 7    ' Fall Risk    01/16/2022   10:35 AM 12/03/2021    4:08 PM 08/12/2021    3:47 PM 07/11/2021    4:24 PM 06/24/2021   11:01 AM  Fall Risk   Falls in the past year? 1 1 0 0 0  Number falls in past yr: 1 1     Injury with Fall? 0 0  Risk for fall due to : History of fall(s);Impaired  balance/gait;Impaired mobility      Follow up Falls evaluation completed        FALL RISK PREVENTION PERTAINING TO THE HOME:  Any stairs in or around the home? Yes  If so, are there any without handrails? Yes  Home free of loose throw rugs in walkways, pet beds, electrical cords, etc? No  Adequate lighting in your home to reduce risk of falls? Yes   ASSISTIVE DEVICES UTILIZED TO PREVENT FALLS:  Life alert? No  Use of a cane, walker or w/c? Yes  Grab bars in the bathroom? Yes  Shower chair or bench in shower? Yes  Elevated toilet seat or a handicapped toilet? No   TIMED UP AND GO:  Was the test performed? No .   Cognitive Function:    07/08/2017   12:09 PM  MMSE - Mini Mental State Exam  Orientation to time 5  Orientation to Place 5  Registration 3  Attention/ Calculation 0  Recall 2  Language- name 2 objects 2  Language- repeat 1  Language- follow 3 step command 3  Language- read & follow direction 1  Write a sentence 1  Copy design 1  Total score 24        01/16/2022   10:31 AM 11/15/2020    1:18 PM 11/15/2019   12:03 PM 11/15/2019   11:55 AM 11/08/2018    1:38 PM  6CIT Screen  What Year? 0 points 0 points  0 points 0 points  What month? 0 points 0 points  0 points 0 points  What time? 0 points 0 points  0 points 0 points  Count back from 20 0 points 0 points 4 points 0 points 2 points  Months in reverse 0 points 2 points  0 points 2 points  Repeat phrase 2 points 4 points  0 points 2 points  Total Score 2 points 6 points  0 points 6 points    Immunizations Immunization History  Administered Date(s) Administered   Fluad Quad(high Dose 65+) 01/15/2021, 01/09/2022   Influenza Split 01/05/2013   Influenza, High Dose Seasonal PF 01/21/2014, 01/01/2015, 01/14/2016, 01/13/2017, 01/16/2018, 12/19/2018, 01/02/2020   Influenza,inj,quad, With Preservative 12/22/2016, 01/13/2018   Influenza-Unspecified 01/21/2014, 01/01/2015   Moderna Sars-Covid-2 Vaccination  04/16/2019, 05/14/2019   Pneumococcal Conjugate-13 10/24/2015   Pneumococcal Polysaccharide-23 07/08/2017   Tdap 07/08/2017    TDAP status: Up to date  Flu Vaccine status: Up to date  Pneumococcal vaccine status: Up to date  Covid-19 vaccine status: Completed vaccines  Qualifies for Shingles Vaccine? Yes   Zostavax completed No   Shingrix Completed?: No.    Education has been provided regarding the importance of this vaccine. Patient has been advised to call insurance company to determine out of pocket expense if they have not yet received this vaccine. Advised may also receive vaccine at local pharmacy or Health Dept. Verbalized acceptance and understanding.  Screening Tests Health Maintenance  Topic Date Due   COVID-19 Vaccine (3 - Moderna risk series) 06/11/2019   Medicare Annual Wellness (AWV)  12/15/2021   Zoster Vaccines- Shingrix (1 of 2) 01/25/2022 (Originally 02/09/1960)   Lung Cancer Screening  11/20/2022   Diabetic kidney evaluation - GFR measurement  01/10/2023   Diabetic kidney evaluation - Urine ACR  01/10/2023   TETANUS/TDAP  07/09/2027   Pneumonia Vaccine 67+ Years old  Completed   INFLUENZA VACCINE  Completed   Hepatitis C Screening  Completed   HPV  [place] Maintenance  Health Maintenance Due  Topic Date Due   COVID-19 Vaccine (3 - Moderna risk series) 06/11/2019   Medicare Annual Wellness (AWV)  12/15/2021    Colorectal cancer screening: Referral to GI placed 01/16/22. Pt aware the office will call re: appt.  Lung Cancer Screening: (Low Dose CT Chest recommended if Age 46-80 years, 30 pack-year currently smoking OR have quit w/in 15years.) does qualify.   Additional Screening:  Hepatitis C Screening: does not qualify  Vision Screening: Recommended annual ophthalmology exams for early detection of glaucoma and other disorders of the eye. Is the patient up to date with their annual eye exam?  No  Who is the provider or what is  the name of the office in which the patient attends annual eye exams? [place] If pt is not established with a provider, would they like to be referred to a provider to establish care? No .   Dental Screening: Recommended annual dental exams for proper oral hygiene  Community Resource Referral / Chronic Care Management: CRR required this visit?  No   CCM required this visit?  No      Plan:     I have personally reviewed and noted the following in the patient's chart:   Medical and social history Use of alcohol, tobacco or illicit drugs  Current medications and supplements including opioid prescriptions. Patient is currently taking opioid prescriptions. Information provided to patient regarding non-opioid alternatives. Patient advised to discuss non-opioid treatment plan with their provider. Functional ability and status Nutritional status Physical activity Advanced directives List of other physicians Hospitalizations, surgeries, and ER visits in previous 12 months Vitals Screenings to include cognitive, depression, and falls Referrals and appointments  In addition, I have reviewed and discussed with patient certain preventive protocols, quality metrics, and best practice recommendations. A written personalized care plan for preventive services as well as general preventive health recommendations were provided to patient.     Felicity Coyer, LPN    [id number]

## 2022-01-16 NOTE — Patient Instructions (Signed)
Marcus Carr , Thank you for taking time to come for your Medicare Wellness Visit. I appreciate your ongoing commitment to your health goals. Please review the following plan we discussed and let me know if I can assist you in the future.   These are the goals we discussed:  Goals       Chronic Disease Management Needs      CARE PLAN ENTRY (see longtitudinal plan of care for additional care plan information)  Current Barriers:  Chronic Disease Management support, education, and care coordination needs related to HTN, CKD, HLD, anxiety  Clinical Goal(s) related to HTN, CKD, HLD, anxiety:  Over the next 60 days, patient will:  Work with the care management team to address educational, disease management, and care coordination needs  Begin or continue self health monitoring activities as directed today Measure and record blood pressure 4 times per week Call provider office for new or worsened signs and symptoms Blood pressure findings outside established parameters Call care management team with questions or concerns Verbalize basic understanding of patient centered plan of care established today  Interventions related to HTN, CKD, HLD, anxiety:  Evaluation of current treatment plans and patient's adherence to plan as established by provider Assessed patient understanding of disease states Assessed patient's education and care coordination needs Provided disease specific education to patient  Collaborated with appropriate clinical care team members regarding patient needs Chart reviewed including recent office notes and lab results Talked with wife by telephone. Patient was sleeping.  Reviewed upcoming appointments Reviewed and discussed medications and recent changes Encouraged to check blood pressure 3-4 times a week and to call PCP with any readings outside of recommended range Discussed mobility and physical activity and ability to perform ADLs Provided with RN care manager contact  number and encouraged to reach out as needed  Patient Self Care Activities related to HTN, CKD, HLD, anxiety:  Patient is unable to independently self-manage chronic health conditions  Initial goal documentation       Exercise 3x per week (30 min per time)      Patient is a member at the [facility]- increase gym visits to 3 times per week for at 20-30 minutes on the stationary bike.      Patient Stated      01/16/2022 AWV Goal: Fall Prevention  Over the next year, patient will decrease their risk for falls by: Using assistive devices, such as a cane or walker, as needed Identifying fall risks within their home and correcting them by: Removing throw rugs Adding handrails to stairs or ramps Removing clutter and keeping a clear pathway throughout the home Increasing light, especially at night Adding shower handles/bars Raising toilet seat Identifying potential personal risk factors for falls: Medication side effects Incontinence/urgency Vestibular dysfunction Hearing loss Musculoskeletal disorders Neurological disorders Orthostatic hypotension        Weight (lb) < 200 lb (90.7 kg) (pt-stated)      Would like to loose 40 pounds         This is a list of the screening recommended for you and due dates:  Health Maintenance  Topic Date Due   COVID-19 Vaccine (3 - Moderna risk series) 06/11/2019   Medicare Annual Wellness Visit  12/15/2021   Zoster (Shingles) Vaccine (1 of 2) 01/25/2022*   Screening for Lung Cancer  11/20/2022   Yearly kidney function blood test for diabetes  01/10/2023   Yearly kidney health urinalysis for diabetes  01/10/2023   Tetanus Vaccine  07/09/2027  Pneumonia Vaccine  Completed   Flu Shot  Completed   Hepatitis C Screening: USPSTF Recommendation to screen - Ages 58-79 yo.  Completed   HPV Vaccine  Aged Out  *Topic was postponed. The date shown is not the original due date.

## 2022-01-17 ENCOUNTER — Ambulatory Visit (INDEPENDENT_AMBULATORY_CARE_PROVIDER_SITE_OTHER): Payer: Medicare Other | Admitting: Family

## 2022-01-17 ENCOUNTER — Encounter: Payer: Self-pay | Admitting: Family

## 2022-01-17 VITALS — BP 119/71 | HR 79 | Temp 97.8°F | Ht 68.0 in

## 2022-01-17 DIAGNOSIS — U071 COVID-19: Secondary | ICD-10-CM | POA: Diagnosis not present

## 2022-01-17 DIAGNOSIS — Z7982 Long term (current) use of aspirin: Secondary | ICD-10-CM | POA: Diagnosis not present

## 2022-01-17 DIAGNOSIS — G8929 Other chronic pain: Secondary | ICD-10-CM | POA: Diagnosis not present

## 2022-01-17 DIAGNOSIS — M109 Gout, unspecified: Secondary | ICD-10-CM | POA: Diagnosis not present

## 2022-01-17 DIAGNOSIS — R911 Solitary pulmonary nodule: Secondary | ICD-10-CM | POA: Diagnosis not present

## 2022-01-17 DIAGNOSIS — E1122 Type 2 diabetes mellitus with diabetic chronic kidney disease: Secondary | ICD-10-CM | POA: Diagnosis not present

## 2022-01-17 DIAGNOSIS — I130 Hypertensive heart and chronic kidney disease with heart failure and stage 1 through stage 4 chronic kidney disease, or unspecified chronic kidney disease: Secondary | ICD-10-CM | POA: Diagnosis not present

## 2022-01-17 DIAGNOSIS — J440 Chronic obstructive pulmonary disease with acute lower respiratory infection: Secondary | ICD-10-CM | POA: Diagnosis not present

## 2022-01-17 DIAGNOSIS — J309 Allergic rhinitis, unspecified: Secondary | ICD-10-CM | POA: Diagnosis not present

## 2022-01-17 DIAGNOSIS — J441 Chronic obstructive pulmonary disease with (acute) exacerbation: Secondary | ICD-10-CM | POA: Diagnosis not present

## 2022-01-17 DIAGNOSIS — E785 Hyperlipidemia, unspecified: Secondary | ICD-10-CM | POA: Diagnosis not present

## 2022-01-17 DIAGNOSIS — N179 Acute kidney failure, unspecified: Secondary | ICD-10-CM | POA: Diagnosis not present

## 2022-01-17 DIAGNOSIS — Z7984 Long term (current) use of oral hypoglycemic drugs: Secondary | ICD-10-CM | POA: Diagnosis not present

## 2022-01-17 DIAGNOSIS — J1282 Pneumonia due to coronavirus disease 2019: Secondary | ICD-10-CM | POA: Diagnosis not present

## 2022-01-17 DIAGNOSIS — N1832 Chronic kidney disease, stage 3b: Secondary | ICD-10-CM | POA: Diagnosis not present

## 2022-01-17 DIAGNOSIS — D631 Anemia in chronic kidney disease: Secondary | ICD-10-CM | POA: Diagnosis not present

## 2022-01-17 DIAGNOSIS — F32A Depression, unspecified: Secondary | ICD-10-CM | POA: Diagnosis not present

## 2022-01-17 DIAGNOSIS — I5032 Chronic diastolic (congestive) heart failure: Secondary | ICD-10-CM | POA: Diagnosis not present

## 2022-01-17 DIAGNOSIS — Z79899 Other long term (current) drug therapy: Secondary | ICD-10-CM | POA: Diagnosis not present

## 2022-01-17 DIAGNOSIS — E1165 Type 2 diabetes mellitus with hyperglycemia: Secondary | ICD-10-CM | POA: Diagnosis not present

## 2022-01-17 DIAGNOSIS — M545 Low back pain, unspecified: Secondary | ICD-10-CM | POA: Diagnosis not present

## 2022-01-17 DIAGNOSIS — J690 Pneumonitis due to inhalation of food and vomit: Secondary | ICD-10-CM | POA: Diagnosis not present

## 2022-01-17 DIAGNOSIS — Z87891 Personal history of nicotine dependence: Secondary | ICD-10-CM | POA: Diagnosis not present

## 2022-01-17 MED ORDER — PREDNISONE 20 MG PO TABS: 40.0000 mg | ORAL_TABLET | Freq: Every day | ORAL | 0 refills | Status: AC

## 2022-01-17 NOTE — Patient Instructions (Signed)
Gout  Gout is a condition that causes painful swelling of the joints. Gout is a type of inflammation of the joints (arthritis). This condition is caused by having too much uric acid in the body. Uric acid is a chemical that forms when the body breaks down substances called purines. Purines are important for building body proteins. When the body has too much uric acid, sharp crystals can form and build up inside the joints. This causes pain and swelling. Gout attacks can happen quickly and may be very painful (acute gout). Over time, the attacks can affect more joints and become more frequent (chronic gout). Gout can also cause uric acid to build up under the skin and inside the kidneys. What are the causes? This condition is caused by too much uric acid in your blood. This can happen because: Your kidneys do not remove enough uric acid from your blood. This is the most common cause. Your body makes too much uric acid. This can happen with some cancers and cancer treatments. It can also occur if your body is breaking down too many red blood cells (hemolytic anemia). You eat too many foods that are high in purines. These foods include organ meats and some seafood. Alcohol, especially beer, is also high in purines. A gout attack may be triggered by trauma or stress. What increases the risk? The following factors may make you more likely to develop this condition: Having a family history of gout. Being male and middle-aged. Being male and having gone through menopause. Taking certain medicines, including aspirin, cyclosporine, diuretics, levodopa, and niacin. Having an organ transplant. Having certain conditions, such as: Being obese. Lead poisoning. Kidney disease. A skin condition called psoriasis. Other factors include: Losing weight too quickly. Being dehydrated. Frequently drinking alcohol, especially beer. Frequently drinking beverages that are sweetened with a type of sugar called  fructose. What are the signs or symptoms? An attack of acute gout happens quickly. It usually occurs in just one joint. The most common place is the big toe. Attacks often start at night. Other joints that may be affected include joints of the feet, ankle, knee, fingers, wrist, or elbow. Symptoms of this condition may include: Severe pain. Warmth. Swelling. Stiffness. Tenderness. The affected joint may be very painful to touch. Shiny, red, or purple skin. Chills and fever. Chronic gout may cause symptoms more frequently. More joints may be involved. You may also have white or yellow lumps (tophi) on your hands or feet or in other areas near your joints. How is this diagnosed? This condition is diagnosed based on your symptoms, your medical history, and a physical exam. You may have tests, such as: Blood tests to measure uric acid levels. Removal of joint fluid with a thin needle (aspiration) to look for uric acid crystals. X-rays to look for joint damage. How is this treated? Treatment for this condition has two phases: treating an acute attack and preventing future attacks. Acute gout treatment may include medicines to reduce pain and swelling, including: NSAIDs, such as ibuprofen. Steroids. These are strong anti-inflammatory medicines that can be taken by mouth (orally) or injected into a joint. Colchicine. This medicine relieves pain and swelling when it is taken soon after an attack. It can be given by mouth or through an IV. Preventive treatment may include: Daily use of smaller doses of NSAIDs or colchicine. Use of a medicine that reduces uric acid levels in your blood, such as allopurinol. Changes to your diet. You may need to see   a dietitian about what to eat and drink to prevent gout. Follow these instructions at home: During a gout attack  If directed, put ice on the affected area. To do this: Put ice in a plastic bag. Place a towel between your skin and the bag. Leave the  ice on for 20 minutes, 2-3 times a day. Remove the ice if your skin turns bright red. This is very important. If you cannot feel pain, heat, or cold, you have a greater risk of damage to the area. Raise (elevate) the affected joint above the level of your heart as often as possible. Rest the joint as much as possible. If the affected joint is in your leg, you may be given crutches to use. Follow instructions from your health care provider about eating or drinking restrictions. Avoiding future gout attacks Follow a low-purine diet as told by your dietitian or health care provider. Avoid foods and drinks that are high in purines, including liver, kidney, anchovies, asparagus, herring, mushrooms, mussels, and beer. Maintain a healthy weight or lose weight if you are overweight. If you want to lose weight, talk with your health care provider. Do not lose weight too quickly. Start or maintain an exercise program as told by your health care provider. Eating and drinking Avoid drinking beverages that contain fructose. Drink enough fluids to keep your urine pale yellow. If you drink alcohol: Limit how much you have to: 0-1 drink a day for women who are not pregnant. 0-2 drinks a day for men. Know how much alcohol is in a drink. In the [place] one drink equals one 12 oz bottle of beer (355 mL), one 5 oz glass of wine (148 mL), or one 1 oz glass of hard liquor (44 mL). General instructions Take over-the-counter and prescription medicines only as told by your health care provider. Ask your health care provider if the medicine prescribed to you requires you to avoid driving or using machinery. Return to your normal activities as told by your health care provider. Ask your health care provider what activities are safe for you. Keep all follow-up visits. This is important. Where to find more information National Institutes of Health: www.niams.nih.gov Contact a health care provider if you have: Another  gout attack. Continuing symptoms of a gout attack after 10 days of treatment. Side effects from your medicines. Chills or a fever. Burning pain when you urinate. Pain in your lower back or abdomen. Get help right away if you: Have severe or uncontrolled pain. Cannot urinate. Summary Gout is painful swelling of the joints caused by having too much uric acid in the body. The most common site for gout to occur is in the big toe, but it can affect other joints in the body. Medicines and dietary changes can help to prevent and treat gout attacks. This information is not intended to replace advice given to you by your health care provider. Make sure you discuss any questions you have with your health care provider. Document Revised: 12/12/2020 Document Reviewed: 12/12/2020 Elsevier Patient Education  2023 Elsevier Inc.  

## 2022-01-17 NOTE — Progress Notes (Signed)
   Subjective:    Patient ID: Marcus Carr, male    DOB: 03-Nov-1940, 81 y.o.   MRN: [id number]  Chief Complaint  Patient presents with   feet pain   Hand Pain    Hand Pain    PT presents to the office today with bilateral great toe swelling and pain that started a week ago. Reports the pain is worse, and is a constant aching pain of 8 out 10 that is worse when he walks on it. Denies any injury. Has hx of gout and reports this feels similar.    Review of Systems  Constitutional:  Positive for fatigue.  Musculoskeletal:  Positive for arthralgias.  All other systems reviewed and are negative.      Objective:   Physical Exam Vitals reviewed.  Constitutional:      General: He is not in acute distress.    Appearance: He is well-developed. He is obese.  HENT:     Head: Normocephalic.  Eyes:     General:        Right eye: No discharge.        Left eye: No discharge.     Pupils: Pupils are equal, round, and reactive to light.  Neck:     Thyroid: No thyromegaly.  Cardiovascular:     Rate and Rhythm: Normal rate and regular rhythm.     Heart sounds: Normal heart sounds. No murmur heard. Pulmonary:     Effort: Pulmonary effort is normal. No respiratory distress.     Breath sounds: Normal breath sounds. No wheezing.  Abdominal:     General: Bowel sounds are normal. There is no distension.     Palpations: Abdomen is soft.     Tenderness: There is no abdominal tenderness.  Musculoskeletal:        General: Tenderness present.     Cervical back: Normal range of motion and neck supple.     Comments: Bilateral great toe swelling, warmth, mild tenderness, and redness  Skin:    General: Skin is warm and dry.     Findings: No erythema or rash.  Neurological:     Mental Status: He is alert and oriented to person, place, and time.     Cranial Nerves: No cranial nerve deficit.     Motor: Weakness present.     Deep Tendon Reflexes: Reflexes are normal and symmetric.   Psychiatric:        Behavior: Behavior normal.        Thought Content: Thought content normal.        Judgment: Judgment normal.       BP 119/71   Pulse 79   Temp 97.8 F (36.6 C) (Temporal)   Ht '5\' 8"'$  (1.727 m)   SpO2 (!) 89%   BMI 42.73 kg/m      Assessment & Plan:  Marcus Carr comes in today with chief complaint of feet pain and Hand Pain   Diagnosis and orders addressed:  1. Acute gout involving toe, unspecified cause, unspecified laterality Force fluids Motrin as needed Prednisone 40 mg daily for 5 days Low purine diet Labs pending  Follow up if symptoms worsen or do not improve  - predniSONE (DELTASONE) 20 MG tablet; Take 2 tablets (40 mg total) by mouth daily with breakfast for 5 days.  Dispense: 10 tablet; Refill: 0 - Uric acid  2. Morbid obesity ([place])   [name], FNP

## 2022-01-18 LAB — URIC ACID: Uric Acid: 9.1 mg/dL — ABNORMAL HIGH (ref 3.8–8.4)

## 2022-01-20 ENCOUNTER — Telehealth: Payer: Self-pay | Admitting: Family

## 2022-01-20 ENCOUNTER — Ambulatory Visit (INDEPENDENT_AMBULATORY_CARE_PROVIDER_SITE_OTHER): Payer: Medicare Other

## 2022-01-20 ENCOUNTER — Telehealth: Payer: Self-pay | Admitting: Family Medicine

## 2022-01-20 DIAGNOSIS — N179 Acute kidney failure, unspecified: Secondary | ICD-10-CM

## 2022-01-20 DIAGNOSIS — J440 Chronic obstructive pulmonary disease with acute lower respiratory infection: Secondary | ICD-10-CM | POA: Diagnosis not present

## 2022-01-20 DIAGNOSIS — J690 Pneumonitis due to inhalation of food and vomit: Secondary | ICD-10-CM | POA: Diagnosis not present

## 2022-01-20 DIAGNOSIS — R911 Solitary pulmonary nodule: Secondary | ICD-10-CM | POA: Diagnosis not present

## 2022-01-20 DIAGNOSIS — J441 Chronic obstructive pulmonary disease with (acute) exacerbation: Secondary | ICD-10-CM

## 2022-01-20 DIAGNOSIS — M109 Gout, unspecified: Secondary | ICD-10-CM | POA: Diagnosis not present

## 2022-01-20 DIAGNOSIS — N1832 Chronic kidney disease, stage 3b: Secondary | ICD-10-CM | POA: Diagnosis not present

## 2022-01-20 DIAGNOSIS — Z7982 Long term (current) use of aspirin: Secondary | ICD-10-CM | POA: Diagnosis not present

## 2022-01-20 DIAGNOSIS — Z8616 Personal history of COVID-19: Secondary | ICD-10-CM

## 2022-01-20 DIAGNOSIS — Z87891 Personal history of nicotine dependence: Secondary | ICD-10-CM | POA: Diagnosis not present

## 2022-01-20 DIAGNOSIS — J309 Allergic rhinitis, unspecified: Secondary | ICD-10-CM

## 2022-01-20 DIAGNOSIS — D631 Anemia in chronic kidney disease: Secondary | ICD-10-CM

## 2022-01-20 DIAGNOSIS — I5032 Chronic diastolic (congestive) heart failure: Secondary | ICD-10-CM

## 2022-01-20 DIAGNOSIS — J1282 Pneumonia due to coronavirus disease 2019: Secondary | ICD-10-CM

## 2022-01-20 DIAGNOSIS — M545 Low back pain, unspecified: Secondary | ICD-10-CM

## 2022-01-20 DIAGNOSIS — E1165 Type 2 diabetes mellitus with hyperglycemia: Secondary | ICD-10-CM | POA: Diagnosis not present

## 2022-01-20 DIAGNOSIS — G8929 Other chronic pain: Secondary | ICD-10-CM

## 2022-01-20 DIAGNOSIS — U071 COVID-19: Secondary | ICD-10-CM | POA: Diagnosis not present

## 2022-01-20 DIAGNOSIS — E1122 Type 2 diabetes mellitus with diabetic chronic kidney disease: Secondary | ICD-10-CM

## 2022-01-20 DIAGNOSIS — I130 Hypertensive heart and chronic kidney disease with heart failure and stage 1 through stage 4 chronic kidney disease, or unspecified chronic kidney disease: Secondary | ICD-10-CM | POA: Diagnosis not present

## 2022-01-20 DIAGNOSIS — Z79899 Other long term (current) drug therapy: Secondary | ICD-10-CM | POA: Diagnosis not present

## 2022-01-20 DIAGNOSIS — E785 Hyperlipidemia, unspecified: Secondary | ICD-10-CM

## 2022-01-20 DIAGNOSIS — F32A Depression, unspecified: Secondary | ICD-10-CM | POA: Diagnosis not present

## 2022-01-20 DIAGNOSIS — Z7984 Long term (current) use of oral hypoglycemic drugs: Secondary | ICD-10-CM | POA: Diagnosis not present

## 2022-01-20 MED ORDER — INDOMETHACIN 50 MG PO CAPS: 50.0000 mg | ORAL_CAPSULE | Freq: Three times a day (TID) | ORAL | 1 refills | Status: DC | PRN

## 2022-01-20 NOTE — Telephone Encounter (Signed)
Patient wife is wanting him to go back on allopurinol

## 2022-01-20 NOTE — Telephone Encounter (Signed)
Patient aware and verbalized understanding. °

## 2022-01-20 NOTE — Telephone Encounter (Signed)
Indomethacin  50 mg TID prn. I can not start allopurinol during an acute attack. Continue prednisone.

## 2022-01-21 DIAGNOSIS — Z7982 Long term (current) use of aspirin: Secondary | ICD-10-CM | POA: Diagnosis not present

## 2022-01-21 DIAGNOSIS — Z79899 Other long term (current) drug therapy: Secondary | ICD-10-CM | POA: Diagnosis not present

## 2022-01-21 DIAGNOSIS — I10 Essential (primary) hypertension: Secondary | ICD-10-CM | POA: Diagnosis not present

## 2022-01-21 DIAGNOSIS — Z87891 Personal history of nicotine dependence: Secondary | ICD-10-CM | POA: Diagnosis not present

## 2022-01-21 DIAGNOSIS — S0083XA Contusion of other part of head, initial encounter: Secondary | ICD-10-CM | POA: Diagnosis not present

## 2022-01-21 DIAGNOSIS — Z880 Allergy status to penicillin: Secondary | ICD-10-CM | POA: Diagnosis not present

## 2022-01-21 DIAGNOSIS — Z85828 Personal history of other malignant neoplasm of skin: Secondary | ICD-10-CM | POA: Diagnosis not present

## 2022-01-21 DIAGNOSIS — R55 Syncope and collapse: Secondary | ICD-10-CM | POA: Diagnosis not present

## 2022-01-21 DIAGNOSIS — R0683 Snoring: Secondary | ICD-10-CM | POA: Diagnosis not present

## 2022-01-21 DIAGNOSIS — E785 Hyperlipidemia, unspecified: Secondary | ICD-10-CM | POA: Diagnosis not present

## 2022-01-21 DIAGNOSIS — I2510 Atherosclerotic heart disease of native coronary artery without angina pectoris: Secondary | ICD-10-CM | POA: Diagnosis not present

## 2022-01-21 DIAGNOSIS — Z8249 Family history of ischemic heart disease and other diseases of the circulatory system: Secondary | ICD-10-CM | POA: Diagnosis not present

## 2022-01-21 DIAGNOSIS — D582 Other hemoglobinopathies: Secondary | ICD-10-CM | POA: Diagnosis not present

## 2022-01-21 DIAGNOSIS — S0990XA Unspecified injury of head, initial encounter: Secondary | ICD-10-CM | POA: Diagnosis not present

## 2022-01-21 DIAGNOSIS — R531 Weakness: Secondary | ICD-10-CM | POA: Diagnosis not present

## 2022-01-21 DIAGNOSIS — S0081XA Abrasion of other part of head, initial encounter: Secondary | ICD-10-CM | POA: Diagnosis not present

## 2022-01-21 DIAGNOSIS — R600 Localized edema: Secondary | ICD-10-CM | POA: Diagnosis not present

## 2022-01-21 DIAGNOSIS — E1169 Type 2 diabetes mellitus with other specified complication: Secondary | ICD-10-CM | POA: Diagnosis not present

## 2022-01-22 DIAGNOSIS — E1142 Type 2 diabetes mellitus with diabetic polyneuropathy: Secondary | ICD-10-CM | POA: Diagnosis not present

## 2022-01-22 DIAGNOSIS — M4802 Spinal stenosis, cervical region: Secondary | ICD-10-CM | POA: Diagnosis not present

## 2022-01-22 DIAGNOSIS — R55 Syncope and collapse: Secondary | ICD-10-CM | POA: Diagnosis not present

## 2022-01-22 DIAGNOSIS — I10 Essential (primary) hypertension: Secondary | ICD-10-CM | POA: Diagnosis not present

## 2022-01-22 DIAGNOSIS — Z8739 Personal history of other diseases of the musculoskeletal system and connective tissue: Secondary | ICD-10-CM | POA: Diagnosis not present

## 2022-01-22 DIAGNOSIS — F32A Depression, unspecified: Secondary | ICD-10-CM | POA: Diagnosis not present

## 2022-01-22 DIAGNOSIS — I2510 Atherosclerotic heart disease of native coronary artery without angina pectoris: Secondary | ICD-10-CM | POA: Diagnosis not present

## 2022-01-23 ENCOUNTER — Ambulatory Visit: Payer: Medicare Other | Admitting: Family

## 2022-01-23 DIAGNOSIS — M4802 Spinal stenosis, cervical region: Secondary | ICD-10-CM | POA: Diagnosis not present

## 2022-01-23 DIAGNOSIS — Z8739 Personal history of other diseases of the musculoskeletal system and connective tissue: Secondary | ICD-10-CM | POA: Diagnosis not present

## 2022-01-23 DIAGNOSIS — I10 Essential (primary) hypertension: Secondary | ICD-10-CM | POA: Diagnosis not present

## 2022-01-23 DIAGNOSIS — F32A Depression, unspecified: Secondary | ICD-10-CM | POA: Diagnosis not present

## 2022-01-23 DIAGNOSIS — R55 Syncope and collapse: Secondary | ICD-10-CM | POA: Diagnosis not present

## 2022-01-23 DIAGNOSIS — E1142 Type 2 diabetes mellitus with diabetic polyneuropathy: Secondary | ICD-10-CM | POA: Diagnosis not present

## 2022-01-24 ENCOUNTER — Other Ambulatory Visit: Payer: Self-pay | Admitting: Family

## 2022-01-24 DIAGNOSIS — J441 Chronic obstructive pulmonary disease with (acute) exacerbation: Secondary | ICD-10-CM | POA: Diagnosis not present

## 2022-01-24 DIAGNOSIS — U071 COVID-19: Secondary | ICD-10-CM | POA: Diagnosis not present

## 2022-01-24 NOTE — Addendum Note (Signed)
Addended by: [name] A on: 01/24/2022 11:55 AM   Modules accepted: Orders

## 2022-01-27 ENCOUNTER — Telehealth: Payer: Self-pay | Admitting: Family

## 2022-01-27 DIAGNOSIS — I10 Essential (primary) hypertension: Secondary | ICD-10-CM | POA: Diagnosis not present

## 2022-01-27 DIAGNOSIS — U071 COVID-19: Secondary | ICD-10-CM | POA: Diagnosis not present

## 2022-01-27 DIAGNOSIS — J441 Chronic obstructive pulmonary disease with (acute) exacerbation: Secondary | ICD-10-CM | POA: Diagnosis not present

## 2022-01-28 DIAGNOSIS — R55 Syncope and collapse: Secondary | ICD-10-CM | POA: Diagnosis not present

## 2022-01-28 NOTE — Telephone Encounter (Signed)
VO given for skilled nursing frequency

## 2022-01-30 DIAGNOSIS — I10 Essential (primary) hypertension: Secondary | ICD-10-CM | POA: Diagnosis not present

## 2022-02-04 ENCOUNTER — Telehealth: Payer: Self-pay | Admitting: Family

## 2022-02-04 DIAGNOSIS — I10 Essential (primary) hypertension: Secondary | ICD-10-CM | POA: Diagnosis not present

## 2022-02-04 NOTE — Telephone Encounter (Signed)
Nancy calling from [place] requesting to d/c OT due to spouse not able to met Marcus Carr for assessment. OT has tried numerous times and client spouse refuses. Please call back to D/C.   Wanted you to be aware of the above message before the ok to DC was given

## 2022-02-04 NOTE — Telephone Encounter (Signed)
Ok to d/c if patient does not want.

## 2022-02-04 NOTE — Telephone Encounter (Signed)
Nancy aware

## 2022-02-07 DIAGNOSIS — I10 Essential (primary) hypertension: Secondary | ICD-10-CM | POA: Diagnosis not present

## 2022-02-11 ENCOUNTER — Telehealth: Payer: Self-pay | Admitting: Family

## 2022-02-11 DIAGNOSIS — I10 Essential (primary) hypertension: Secondary | ICD-10-CM | POA: Diagnosis not present

## 2022-02-11 MED ORDER — BLOOD GLUCOSE METER KIT: PACK | 0 refills | Status: AC

## 2022-02-11 NOTE — Telephone Encounter (Signed)
Patient aware sent in

## 2022-02-12 DIAGNOSIS — I10 Essential (primary) hypertension: Secondary | ICD-10-CM | POA: Diagnosis not present

## 2022-02-13 ENCOUNTER — Other Ambulatory Visit: Payer: Self-pay | Admitting: Family

## 2022-02-13 DIAGNOSIS — E1142 Type 2 diabetes mellitus with diabetic polyneuropathy: Secondary | ICD-10-CM

## 2022-02-18 DIAGNOSIS — I10 Essential (primary) hypertension: Secondary | ICD-10-CM | POA: Diagnosis not present

## 2022-02-19 DIAGNOSIS — I10 Essential (primary) hypertension: Secondary | ICD-10-CM | POA: Diagnosis not present

## 2022-02-22 ENCOUNTER — Other Ambulatory Visit: Payer: Self-pay | Admitting: Family

## 2022-02-22 DIAGNOSIS — E785 Hyperlipidemia, unspecified: Secondary | ICD-10-CM

## 2022-02-22 DIAGNOSIS — E8881 Metabolic syndrome: Secondary | ICD-10-CM

## 2022-02-23 DIAGNOSIS — J441 Chronic obstructive pulmonary disease with (acute) exacerbation: Secondary | ICD-10-CM | POA: Diagnosis not present

## 2022-02-23 DIAGNOSIS — U071 COVID-19: Secondary | ICD-10-CM | POA: Diagnosis not present

## 2022-02-24 DIAGNOSIS — I10 Essential (primary) hypertension: Secondary | ICD-10-CM | POA: Diagnosis not present

## 2022-02-25 DIAGNOSIS — I10 Essential (primary) hypertension: Secondary | ICD-10-CM | POA: Diagnosis not present

## 2022-02-26 ENCOUNTER — Other Ambulatory Visit: Payer: Self-pay | Admitting: Family

## 2022-02-26 DIAGNOSIS — J441 Chronic obstructive pulmonary disease with (acute) exacerbation: Secondary | ICD-10-CM | POA: Diagnosis not present

## 2022-02-26 DIAGNOSIS — U071 COVID-19: Secondary | ICD-10-CM | POA: Diagnosis not present

## 2022-02-27 ENCOUNTER — Other Ambulatory Visit: Payer: Self-pay | Admitting: Family

## 2022-03-05 DIAGNOSIS — I10 Essential (primary) hypertension: Secondary | ICD-10-CM | POA: Diagnosis not present

## 2022-03-06 ENCOUNTER — Other Ambulatory Visit: Payer: Self-pay | Admitting: Family

## 2022-03-06 DIAGNOSIS — I10 Essential (primary) hypertension: Secondary | ICD-10-CM

## 2022-03-06 DIAGNOSIS — R2243 Localized swelling, mass and lump, lower limb, bilateral: Secondary | ICD-10-CM

## 2022-03-11 DIAGNOSIS — Z9981 Dependence on supplemental oxygen: Secondary | ICD-10-CM | POA: Diagnosis not present

## 2022-03-11 DIAGNOSIS — E1142 Type 2 diabetes mellitus with diabetic polyneuropathy: Secondary | ICD-10-CM | POA: Diagnosis not present

## 2022-03-11 DIAGNOSIS — G8929 Other chronic pain: Secondary | ICD-10-CM | POA: Diagnosis not present

## 2022-03-11 DIAGNOSIS — I130 Hypertensive heart and chronic kidney disease with heart failure and stage 1 through stage 4 chronic kidney disease, or unspecified chronic kidney disease: Secondary | ICD-10-CM | POA: Diagnosis not present

## 2022-03-11 DIAGNOSIS — E785 Hyperlipidemia, unspecified: Secondary | ICD-10-CM | POA: Diagnosis not present

## 2022-03-11 DIAGNOSIS — I5032 Chronic diastolic (congestive) heart failure: Secondary | ICD-10-CM | POA: Diagnosis not present

## 2022-03-11 DIAGNOSIS — Z7984 Long term (current) use of oral hypoglycemic drugs: Secondary | ICD-10-CM | POA: Diagnosis not present

## 2022-03-11 DIAGNOSIS — Z87891 Personal history of nicotine dependence: Secondary | ICD-10-CM | POA: Diagnosis not present

## 2022-03-11 DIAGNOSIS — N1832 Chronic kidney disease, stage 3b: Secondary | ICD-10-CM | POA: Diagnosis not present

## 2022-03-11 DIAGNOSIS — D631 Anemia in chronic kidney disease: Secondary | ICD-10-CM | POA: Diagnosis not present

## 2022-03-11 DIAGNOSIS — M5021 Other cervical disc displacement,  high cervical region: Secondary | ICD-10-CM | POA: Diagnosis not present

## 2022-03-11 DIAGNOSIS — I959 Hypotension, unspecified: Secondary | ICD-10-CM | POA: Diagnosis not present

## 2022-03-11 DIAGNOSIS — M5136 Other intervertebral disc degeneration, lumbar region: Secondary | ICD-10-CM | POA: Diagnosis not present

## 2022-03-11 DIAGNOSIS — M4802 Spinal stenosis, cervical region: Secondary | ICD-10-CM | POA: Diagnosis not present

## 2022-03-11 DIAGNOSIS — Z7952 Long term (current) use of systemic steroids: Secondary | ICD-10-CM | POA: Diagnosis not present

## 2022-03-11 DIAGNOSIS — Z79899 Other long term (current) drug therapy: Secondary | ICD-10-CM | POA: Diagnosis not present

## 2022-03-11 DIAGNOSIS — M109 Gout, unspecified: Secondary | ICD-10-CM | POA: Diagnosis not present

## 2022-03-11 DIAGNOSIS — J441 Chronic obstructive pulmonary disease with (acute) exacerbation: Secondary | ICD-10-CM | POA: Diagnosis not present

## 2022-03-11 DIAGNOSIS — Z7982 Long term (current) use of aspirin: Secondary | ICD-10-CM | POA: Diagnosis not present

## 2022-03-11 DIAGNOSIS — E1122 Type 2 diabetes mellitus with diabetic chronic kidney disease: Secondary | ICD-10-CM | POA: Diagnosis not present

## 2022-03-11 DIAGNOSIS — M1712 Unilateral primary osteoarthritis, left knee: Secondary | ICD-10-CM | POA: Diagnosis not present

## 2022-03-12 ENCOUNTER — Telehealth: Payer: Self-pay | Admitting: Family

## 2022-03-12 DIAGNOSIS — I5032 Chronic diastolic (congestive) heart failure: Secondary | ICD-10-CM | POA: Diagnosis not present

## 2022-03-12 DIAGNOSIS — I959 Hypotension, unspecified: Secondary | ICD-10-CM | POA: Diagnosis not present

## 2022-03-12 DIAGNOSIS — N1832 Chronic kidney disease, stage 3b: Secondary | ICD-10-CM | POA: Diagnosis not present

## 2022-03-12 DIAGNOSIS — Z9981 Dependence on supplemental oxygen: Secondary | ICD-10-CM | POA: Diagnosis not present

## 2022-03-12 DIAGNOSIS — D631 Anemia in chronic kidney disease: Secondary | ICD-10-CM | POA: Diagnosis not present

## 2022-03-12 DIAGNOSIS — Z7982 Long term (current) use of aspirin: Secondary | ICD-10-CM | POA: Diagnosis not present

## 2022-03-12 DIAGNOSIS — Z7984 Long term (current) use of oral hypoglycemic drugs: Secondary | ICD-10-CM | POA: Diagnosis not present

## 2022-03-12 DIAGNOSIS — M109 Gout, unspecified: Secondary | ICD-10-CM | POA: Diagnosis not present

## 2022-03-12 DIAGNOSIS — M5021 Other cervical disc displacement,  high cervical region: Secondary | ICD-10-CM | POA: Diagnosis not present

## 2022-03-12 DIAGNOSIS — E1142 Type 2 diabetes mellitus with diabetic polyneuropathy: Secondary | ICD-10-CM | POA: Diagnosis not present

## 2022-03-12 DIAGNOSIS — Z79899 Other long term (current) drug therapy: Secondary | ICD-10-CM | POA: Diagnosis not present

## 2022-03-12 DIAGNOSIS — G8929 Other chronic pain: Secondary | ICD-10-CM | POA: Diagnosis not present

## 2022-03-12 DIAGNOSIS — M1712 Unilateral primary osteoarthritis, left knee: Secondary | ICD-10-CM | POA: Diagnosis not present

## 2022-03-12 DIAGNOSIS — M5136 Other intervertebral disc degeneration, lumbar region: Secondary | ICD-10-CM | POA: Diagnosis not present

## 2022-03-12 DIAGNOSIS — I130 Hypertensive heart and chronic kidney disease with heart failure and stage 1 through stage 4 chronic kidney disease, or unspecified chronic kidney disease: Secondary | ICD-10-CM | POA: Diagnosis not present

## 2022-03-12 DIAGNOSIS — Z7952 Long term (current) use of systemic steroids: Secondary | ICD-10-CM | POA: Diagnosis not present

## 2022-03-12 DIAGNOSIS — E1122 Type 2 diabetes mellitus with diabetic chronic kidney disease: Secondary | ICD-10-CM | POA: Diagnosis not present

## 2022-03-12 DIAGNOSIS — J441 Chronic obstructive pulmonary disease with (acute) exacerbation: Secondary | ICD-10-CM | POA: Diagnosis not present

## 2022-03-12 DIAGNOSIS — Z87891 Personal history of nicotine dependence: Secondary | ICD-10-CM | POA: Diagnosis not present

## 2022-03-12 DIAGNOSIS — E785 Hyperlipidemia, unspecified: Secondary | ICD-10-CM | POA: Diagnosis not present

## 2022-03-12 DIAGNOSIS — M4802 Spinal stenosis, cervical region: Secondary | ICD-10-CM | POA: Diagnosis not present

## 2022-03-12 NOTE — Telephone Encounter (Signed)
Marcus Carr ([facility] nurse) called to let pts PCP know that when she checked pts weight yesterday, it was 289.2.Marland Kitchen When it was checked today, it was 291. Says she noticed that he has swelling in both feet, but more in the LT foot.   Can contact Marcus Carr at [phone] if needed.

## 2022-03-13 ENCOUNTER — Other Ambulatory Visit: Payer: Self-pay | Admitting: Family

## 2022-03-13 NOTE — Telephone Encounter (Signed)
Left message on [name] ([facility]) VM regarding provider feedback.

## 2022-03-13 NOTE — Telephone Encounter (Signed)
Take an extra lasix today. Low salt diet.

## 2022-03-19 ENCOUNTER — Other Ambulatory Visit: Payer: Self-pay | Admitting: Family

## 2022-03-19 DIAGNOSIS — Z7982 Long term (current) use of aspirin: Secondary | ICD-10-CM | POA: Diagnosis not present

## 2022-03-19 DIAGNOSIS — F419 Anxiety disorder, unspecified: Secondary | ICD-10-CM

## 2022-03-19 DIAGNOSIS — M5136 Other intervertebral disc degeneration, lumbar region: Secondary | ICD-10-CM | POA: Diagnosis not present

## 2022-03-19 DIAGNOSIS — I959 Hypotension, unspecified: Secondary | ICD-10-CM | POA: Diagnosis not present

## 2022-03-19 DIAGNOSIS — J441 Chronic obstructive pulmonary disease with (acute) exacerbation: Secondary | ICD-10-CM | POA: Diagnosis not present

## 2022-03-19 DIAGNOSIS — I5032 Chronic diastolic (congestive) heart failure: Secondary | ICD-10-CM | POA: Diagnosis not present

## 2022-03-19 DIAGNOSIS — G8929 Other chronic pain: Secondary | ICD-10-CM | POA: Diagnosis not present

## 2022-03-19 DIAGNOSIS — E785 Hyperlipidemia, unspecified: Secondary | ICD-10-CM | POA: Diagnosis not present

## 2022-03-19 DIAGNOSIS — M1712 Unilateral primary osteoarthritis, left knee: Secondary | ICD-10-CM | POA: Diagnosis not present

## 2022-03-19 DIAGNOSIS — Z7984 Long term (current) use of oral hypoglycemic drugs: Secondary | ICD-10-CM | POA: Diagnosis not present

## 2022-03-19 DIAGNOSIS — D631 Anemia in chronic kidney disease: Secondary | ICD-10-CM | POA: Diagnosis not present

## 2022-03-19 DIAGNOSIS — Z7952 Long term (current) use of systemic steroids: Secondary | ICD-10-CM | POA: Diagnosis not present

## 2022-03-19 DIAGNOSIS — I130 Hypertensive heart and chronic kidney disease with heart failure and stage 1 through stage 4 chronic kidney disease, or unspecified chronic kidney disease: Secondary | ICD-10-CM | POA: Diagnosis not present

## 2022-03-19 DIAGNOSIS — Z79899 Other long term (current) drug therapy: Secondary | ICD-10-CM | POA: Diagnosis not present

## 2022-03-19 DIAGNOSIS — M4802 Spinal stenosis, cervical region: Secondary | ICD-10-CM | POA: Diagnosis not present

## 2022-03-19 DIAGNOSIS — M5021 Other cervical disc displacement,  high cervical region: Secondary | ICD-10-CM | POA: Diagnosis not present

## 2022-03-19 DIAGNOSIS — Z9981 Dependence on supplemental oxygen: Secondary | ICD-10-CM | POA: Diagnosis not present

## 2022-03-19 DIAGNOSIS — Z87891 Personal history of nicotine dependence: Secondary | ICD-10-CM | POA: Diagnosis not present

## 2022-03-19 DIAGNOSIS — M109 Gout, unspecified: Secondary | ICD-10-CM | POA: Diagnosis not present

## 2022-03-19 DIAGNOSIS — N1832 Chronic kidney disease, stage 3b: Secondary | ICD-10-CM | POA: Diagnosis not present

## 2022-03-19 DIAGNOSIS — E1142 Type 2 diabetes mellitus with diabetic polyneuropathy: Secondary | ICD-10-CM | POA: Diagnosis not present

## 2022-03-19 DIAGNOSIS — E1122 Type 2 diabetes mellitus with diabetic chronic kidney disease: Secondary | ICD-10-CM | POA: Diagnosis not present

## 2022-03-21 DIAGNOSIS — Z87891 Personal history of nicotine dependence: Secondary | ICD-10-CM | POA: Diagnosis not present

## 2022-03-21 DIAGNOSIS — J441 Chronic obstructive pulmonary disease with (acute) exacerbation: Secondary | ICD-10-CM | POA: Diagnosis not present

## 2022-03-21 DIAGNOSIS — Z7982 Long term (current) use of aspirin: Secondary | ICD-10-CM | POA: Diagnosis not present

## 2022-03-21 DIAGNOSIS — D631 Anemia in chronic kidney disease: Secondary | ICD-10-CM | POA: Diagnosis not present

## 2022-03-21 DIAGNOSIS — M109 Gout, unspecified: Secondary | ICD-10-CM | POA: Diagnosis not present

## 2022-03-21 DIAGNOSIS — E1122 Type 2 diabetes mellitus with diabetic chronic kidney disease: Secondary | ICD-10-CM | POA: Diagnosis not present

## 2022-03-21 DIAGNOSIS — M1712 Unilateral primary osteoarthritis, left knee: Secondary | ICD-10-CM | POA: Diagnosis not present

## 2022-03-21 DIAGNOSIS — Z7984 Long term (current) use of oral hypoglycemic drugs: Secondary | ICD-10-CM | POA: Diagnosis not present

## 2022-03-21 DIAGNOSIS — I130 Hypertensive heart and chronic kidney disease with heart failure and stage 1 through stage 4 chronic kidney disease, or unspecified chronic kidney disease: Secondary | ICD-10-CM | POA: Diagnosis not present

## 2022-03-21 DIAGNOSIS — Z79899 Other long term (current) drug therapy: Secondary | ICD-10-CM | POA: Diagnosis not present

## 2022-03-21 DIAGNOSIS — M4802 Spinal stenosis, cervical region: Secondary | ICD-10-CM | POA: Diagnosis not present

## 2022-03-21 DIAGNOSIS — E1142 Type 2 diabetes mellitus with diabetic polyneuropathy: Secondary | ICD-10-CM | POA: Diagnosis not present

## 2022-03-21 DIAGNOSIS — M5021 Other cervical disc displacement,  high cervical region: Secondary | ICD-10-CM | POA: Diagnosis not present

## 2022-03-21 DIAGNOSIS — E785 Hyperlipidemia, unspecified: Secondary | ICD-10-CM | POA: Diagnosis not present

## 2022-03-21 DIAGNOSIS — Z9981 Dependence on supplemental oxygen: Secondary | ICD-10-CM | POA: Diagnosis not present

## 2022-03-21 DIAGNOSIS — N1832 Chronic kidney disease, stage 3b: Secondary | ICD-10-CM | POA: Diagnosis not present

## 2022-03-21 DIAGNOSIS — I959 Hypotension, unspecified: Secondary | ICD-10-CM | POA: Diagnosis not present

## 2022-03-21 DIAGNOSIS — Z7952 Long term (current) use of systemic steroids: Secondary | ICD-10-CM | POA: Diagnosis not present

## 2022-03-21 DIAGNOSIS — G8929 Other chronic pain: Secondary | ICD-10-CM | POA: Diagnosis not present

## 2022-03-21 DIAGNOSIS — M5136 Other intervertebral disc degeneration, lumbar region: Secondary | ICD-10-CM | POA: Diagnosis not present

## 2022-03-21 DIAGNOSIS — I5032 Chronic diastolic (congestive) heart failure: Secondary | ICD-10-CM | POA: Diagnosis not present

## 2022-03-25 DIAGNOSIS — Z87891 Personal history of nicotine dependence: Secondary | ICD-10-CM | POA: Diagnosis not present

## 2022-03-25 DIAGNOSIS — M109 Gout, unspecified: Secondary | ICD-10-CM | POA: Diagnosis not present

## 2022-03-25 DIAGNOSIS — M5136 Other intervertebral disc degeneration, lumbar region: Secondary | ICD-10-CM | POA: Diagnosis not present

## 2022-03-25 DIAGNOSIS — E785 Hyperlipidemia, unspecified: Secondary | ICD-10-CM | POA: Diagnosis not present

## 2022-03-25 DIAGNOSIS — N1832 Chronic kidney disease, stage 3b: Secondary | ICD-10-CM | POA: Diagnosis not present

## 2022-03-25 DIAGNOSIS — Z7952 Long term (current) use of systemic steroids: Secondary | ICD-10-CM | POA: Diagnosis not present

## 2022-03-25 DIAGNOSIS — I959 Hypotension, unspecified: Secondary | ICD-10-CM | POA: Diagnosis not present

## 2022-03-25 DIAGNOSIS — I130 Hypertensive heart and chronic kidney disease with heart failure and stage 1 through stage 4 chronic kidney disease, or unspecified chronic kidney disease: Secondary | ICD-10-CM | POA: Diagnosis not present

## 2022-03-25 DIAGNOSIS — M5021 Other cervical disc displacement,  high cervical region: Secondary | ICD-10-CM | POA: Diagnosis not present

## 2022-03-25 DIAGNOSIS — I5032 Chronic diastolic (congestive) heart failure: Secondary | ICD-10-CM | POA: Diagnosis not present

## 2022-03-25 DIAGNOSIS — M1712 Unilateral primary osteoarthritis, left knee: Secondary | ICD-10-CM | POA: Diagnosis not present

## 2022-03-25 DIAGNOSIS — G8929 Other chronic pain: Secondary | ICD-10-CM | POA: Diagnosis not present

## 2022-03-25 DIAGNOSIS — D631 Anemia in chronic kidney disease: Secondary | ICD-10-CM | POA: Diagnosis not present

## 2022-03-25 DIAGNOSIS — E1142 Type 2 diabetes mellitus with diabetic polyneuropathy: Secondary | ICD-10-CM | POA: Diagnosis not present

## 2022-03-25 DIAGNOSIS — J441 Chronic obstructive pulmonary disease with (acute) exacerbation: Secondary | ICD-10-CM | POA: Diagnosis not present

## 2022-03-25 DIAGNOSIS — M4802 Spinal stenosis, cervical region: Secondary | ICD-10-CM | POA: Diagnosis not present

## 2022-03-25 DIAGNOSIS — Z9981 Dependence on supplemental oxygen: Secondary | ICD-10-CM | POA: Diagnosis not present

## 2022-03-25 DIAGNOSIS — Z7984 Long term (current) use of oral hypoglycemic drugs: Secondary | ICD-10-CM | POA: Diagnosis not present

## 2022-03-25 DIAGNOSIS — Z7982 Long term (current) use of aspirin: Secondary | ICD-10-CM | POA: Diagnosis not present

## 2022-03-25 DIAGNOSIS — Z79899 Other long term (current) drug therapy: Secondary | ICD-10-CM | POA: Diagnosis not present

## 2022-03-25 DIAGNOSIS — E1122 Type 2 diabetes mellitus with diabetic chronic kidney disease: Secondary | ICD-10-CM | POA: Diagnosis not present

## 2022-03-26 DIAGNOSIS — J441 Chronic obstructive pulmonary disease with (acute) exacerbation: Secondary | ICD-10-CM | POA: Diagnosis not present

## 2022-03-26 DIAGNOSIS — U071 COVID-19: Secondary | ICD-10-CM | POA: Diagnosis not present

## 2022-03-28 ENCOUNTER — Ambulatory Visit (INDEPENDENT_AMBULATORY_CARE_PROVIDER_SITE_OTHER): Payer: Medicare Other

## 2022-03-28 DIAGNOSIS — M5136 Other intervertebral disc degeneration, lumbar region: Secondary | ICD-10-CM

## 2022-03-28 DIAGNOSIS — N1832 Chronic kidney disease, stage 3b: Secondary | ICD-10-CM

## 2022-03-28 DIAGNOSIS — G8929 Other chronic pain: Secondary | ICD-10-CM

## 2022-03-28 DIAGNOSIS — I5032 Chronic diastolic (congestive) heart failure: Secondary | ICD-10-CM

## 2022-03-28 DIAGNOSIS — M5021 Other cervical disc displacement,  high cervical region: Secondary | ICD-10-CM

## 2022-03-28 DIAGNOSIS — D631 Anemia in chronic kidney disease: Secondary | ICD-10-CM

## 2022-03-28 DIAGNOSIS — M1712 Unilateral primary osteoarthritis, left knee: Secondary | ICD-10-CM | POA: Diagnosis not present

## 2022-03-28 DIAGNOSIS — E1122 Type 2 diabetes mellitus with diabetic chronic kidney disease: Secondary | ICD-10-CM | POA: Diagnosis not present

## 2022-03-28 DIAGNOSIS — I959 Hypotension, unspecified: Secondary | ICD-10-CM

## 2022-03-28 DIAGNOSIS — M109 Gout, unspecified: Secondary | ICD-10-CM

## 2022-03-28 DIAGNOSIS — E1142 Type 2 diabetes mellitus with diabetic polyneuropathy: Secondary | ICD-10-CM

## 2022-03-28 DIAGNOSIS — F418 Other specified anxiety disorders: Secondary | ICD-10-CM

## 2022-03-28 DIAGNOSIS — J441 Chronic obstructive pulmonary disease with (acute) exacerbation: Secondary | ICD-10-CM

## 2022-03-28 DIAGNOSIS — I130 Hypertensive heart and chronic kidney disease with heart failure and stage 1 through stage 4 chronic kidney disease, or unspecified chronic kidney disease: Secondary | ICD-10-CM

## 2022-03-28 DIAGNOSIS — M4802 Spinal stenosis, cervical region: Secondary | ICD-10-CM

## 2022-03-29 DIAGNOSIS — J441 Chronic obstructive pulmonary disease with (acute) exacerbation: Secondary | ICD-10-CM | POA: Diagnosis not present

## 2022-03-29 DIAGNOSIS — U071 COVID-19: Secondary | ICD-10-CM | POA: Diagnosis not present

## 2022-03-31 ENCOUNTER — Other Ambulatory Visit: Payer: Self-pay | Admitting: Family

## 2022-03-31 DIAGNOSIS — F419 Anxiety disorder, unspecified: Secondary | ICD-10-CM

## 2022-04-02 ENCOUNTER — Other Ambulatory Visit: Payer: Self-pay | Admitting: Family

## 2022-04-02 DIAGNOSIS — E1142 Type 2 diabetes mellitus with diabetic polyneuropathy: Secondary | ICD-10-CM

## 2022-04-03 ENCOUNTER — Other Ambulatory Visit: Payer: Self-pay | Admitting: Family Medicine

## 2022-04-03 DIAGNOSIS — I10 Essential (primary) hypertension: Secondary | ICD-10-CM

## 2022-04-03 MED ORDER — VERAPAMIL HCL ER 120 MG PO TBCR: 120.0000 mg | EXTENDED_RELEASE_TABLET | Freq: Every day | ORAL | 1 refills | Status: DC

## 2022-04-07 DIAGNOSIS — M5136 Other intervertebral disc degeneration, lumbar region: Secondary | ICD-10-CM | POA: Diagnosis not present

## 2022-04-07 DIAGNOSIS — Z79891 Long term (current) use of opiate analgesic: Secondary | ICD-10-CM | POA: Diagnosis not present

## 2022-04-09 DIAGNOSIS — Z7982 Long term (current) use of aspirin: Secondary | ICD-10-CM | POA: Diagnosis not present

## 2022-04-09 DIAGNOSIS — Z7952 Long term (current) use of systemic steroids: Secondary | ICD-10-CM | POA: Diagnosis not present

## 2022-04-09 DIAGNOSIS — D631 Anemia in chronic kidney disease: Secondary | ICD-10-CM | POA: Diagnosis not present

## 2022-04-09 DIAGNOSIS — E785 Hyperlipidemia, unspecified: Secondary | ICD-10-CM | POA: Diagnosis not present

## 2022-04-09 DIAGNOSIS — J441 Chronic obstructive pulmonary disease with (acute) exacerbation: Secondary | ICD-10-CM | POA: Diagnosis not present

## 2022-04-09 DIAGNOSIS — M109 Gout, unspecified: Secondary | ICD-10-CM | POA: Diagnosis not present

## 2022-04-09 DIAGNOSIS — Z7984 Long term (current) use of oral hypoglycemic drugs: Secondary | ICD-10-CM | POA: Diagnosis not present

## 2022-04-09 DIAGNOSIS — E1122 Type 2 diabetes mellitus with diabetic chronic kidney disease: Secondary | ICD-10-CM | POA: Diagnosis not present

## 2022-04-09 DIAGNOSIS — N1832 Chronic kidney disease, stage 3b: Secondary | ICD-10-CM | POA: Diagnosis not present

## 2022-04-09 DIAGNOSIS — I5032 Chronic diastolic (congestive) heart failure: Secondary | ICD-10-CM | POA: Diagnosis not present

## 2022-04-09 DIAGNOSIS — M5021 Other cervical disc displacement,  high cervical region: Secondary | ICD-10-CM | POA: Diagnosis not present

## 2022-04-09 DIAGNOSIS — Z9981 Dependence on supplemental oxygen: Secondary | ICD-10-CM | POA: Diagnosis not present

## 2022-04-09 DIAGNOSIS — M1712 Unilateral primary osteoarthritis, left knee: Secondary | ICD-10-CM | POA: Diagnosis not present

## 2022-04-09 DIAGNOSIS — M5136 Other intervertebral disc degeneration, lumbar region: Secondary | ICD-10-CM | POA: Diagnosis not present

## 2022-04-09 DIAGNOSIS — Z79899 Other long term (current) drug therapy: Secondary | ICD-10-CM | POA: Diagnosis not present

## 2022-04-09 DIAGNOSIS — G8929 Other chronic pain: Secondary | ICD-10-CM | POA: Diagnosis not present

## 2022-04-09 DIAGNOSIS — I130 Hypertensive heart and chronic kidney disease with heart failure and stage 1 through stage 4 chronic kidney disease, or unspecified chronic kidney disease: Secondary | ICD-10-CM | POA: Diagnosis not present

## 2022-04-09 DIAGNOSIS — M4802 Spinal stenosis, cervical region: Secondary | ICD-10-CM | POA: Diagnosis not present

## 2022-04-09 DIAGNOSIS — Z87891 Personal history of nicotine dependence: Secondary | ICD-10-CM | POA: Diagnosis not present

## 2022-04-09 DIAGNOSIS — E1142 Type 2 diabetes mellitus with diabetic polyneuropathy: Secondary | ICD-10-CM | POA: Diagnosis not present

## 2022-04-13 ENCOUNTER — Other Ambulatory Visit: Payer: Self-pay | Admitting: Family

## 2022-04-22 DIAGNOSIS — G8929 Other chronic pain: Secondary | ICD-10-CM | POA: Diagnosis not present

## 2022-04-22 DIAGNOSIS — Z9981 Dependence on supplemental oxygen: Secondary | ICD-10-CM | POA: Diagnosis not present

## 2022-04-22 DIAGNOSIS — J441 Chronic obstructive pulmonary disease with (acute) exacerbation: Secondary | ICD-10-CM | POA: Diagnosis not present

## 2022-04-22 DIAGNOSIS — M1712 Unilateral primary osteoarthritis, left knee: Secondary | ICD-10-CM | POA: Diagnosis not present

## 2022-04-22 DIAGNOSIS — I130 Hypertensive heart and chronic kidney disease with heart failure and stage 1 through stage 4 chronic kidney disease, or unspecified chronic kidney disease: Secondary | ICD-10-CM | POA: Diagnosis not present

## 2022-04-22 DIAGNOSIS — Z7984 Long term (current) use of oral hypoglycemic drugs: Secondary | ICD-10-CM | POA: Diagnosis not present

## 2022-04-22 DIAGNOSIS — M109 Gout, unspecified: Secondary | ICD-10-CM | POA: Diagnosis not present

## 2022-04-22 DIAGNOSIS — E1142 Type 2 diabetes mellitus with diabetic polyneuropathy: Secondary | ICD-10-CM | POA: Diagnosis not present

## 2022-04-22 DIAGNOSIS — N1832 Chronic kidney disease, stage 3b: Secondary | ICD-10-CM | POA: Diagnosis not present

## 2022-04-22 DIAGNOSIS — M5021 Other cervical disc displacement,  high cervical region: Secondary | ICD-10-CM | POA: Diagnosis not present

## 2022-04-22 DIAGNOSIS — E1122 Type 2 diabetes mellitus with diabetic chronic kidney disease: Secondary | ICD-10-CM | POA: Diagnosis not present

## 2022-04-22 DIAGNOSIS — Z79899 Other long term (current) drug therapy: Secondary | ICD-10-CM | POA: Diagnosis not present

## 2022-04-22 DIAGNOSIS — M4802 Spinal stenosis, cervical region: Secondary | ICD-10-CM | POA: Diagnosis not present

## 2022-04-22 DIAGNOSIS — Z87891 Personal history of nicotine dependence: Secondary | ICD-10-CM | POA: Diagnosis not present

## 2022-04-22 DIAGNOSIS — M5136 Other intervertebral disc degeneration, lumbar region: Secondary | ICD-10-CM | POA: Diagnosis not present

## 2022-04-22 DIAGNOSIS — I5032 Chronic diastolic (congestive) heart failure: Secondary | ICD-10-CM | POA: Diagnosis not present

## 2022-04-22 DIAGNOSIS — Z7952 Long term (current) use of systemic steroids: Secondary | ICD-10-CM | POA: Diagnosis not present

## 2022-04-22 DIAGNOSIS — D631 Anemia in chronic kidney disease: Secondary | ICD-10-CM | POA: Diagnosis not present

## 2022-04-22 DIAGNOSIS — Z7982 Long term (current) use of aspirin: Secondary | ICD-10-CM | POA: Diagnosis not present

## 2022-04-22 DIAGNOSIS — E785 Hyperlipidemia, unspecified: Secondary | ICD-10-CM | POA: Diagnosis not present

## 2022-04-26 DIAGNOSIS — U071 COVID-19: Secondary | ICD-10-CM | POA: Diagnosis not present

## 2022-04-26 DIAGNOSIS — J441 Chronic obstructive pulmonary disease with (acute) exacerbation: Secondary | ICD-10-CM | POA: Diagnosis not present

## 2022-04-29 DIAGNOSIS — J441 Chronic obstructive pulmonary disease with (acute) exacerbation: Secondary | ICD-10-CM | POA: Diagnosis not present

## 2022-04-29 DIAGNOSIS — U071 COVID-19: Secondary | ICD-10-CM | POA: Diagnosis not present

## 2022-05-06 DIAGNOSIS — Z7984 Long term (current) use of oral hypoglycemic drugs: Secondary | ICD-10-CM | POA: Diagnosis not present

## 2022-05-06 DIAGNOSIS — M5021 Other cervical disc displacement,  high cervical region: Secondary | ICD-10-CM | POA: Diagnosis not present

## 2022-05-06 DIAGNOSIS — Z7952 Long term (current) use of systemic steroids: Secondary | ICD-10-CM | POA: Diagnosis not present

## 2022-05-06 DIAGNOSIS — M109 Gout, unspecified: Secondary | ICD-10-CM | POA: Diagnosis not present

## 2022-05-06 DIAGNOSIS — E785 Hyperlipidemia, unspecified: Secondary | ICD-10-CM | POA: Diagnosis not present

## 2022-05-06 DIAGNOSIS — I5032 Chronic diastolic (congestive) heart failure: Secondary | ICD-10-CM | POA: Diagnosis not present

## 2022-05-06 DIAGNOSIS — N1832 Chronic kidney disease, stage 3b: Secondary | ICD-10-CM | POA: Diagnosis not present

## 2022-05-06 DIAGNOSIS — I130 Hypertensive heart and chronic kidney disease with heart failure and stage 1 through stage 4 chronic kidney disease, or unspecified chronic kidney disease: Secondary | ICD-10-CM | POA: Diagnosis not present

## 2022-05-06 DIAGNOSIS — M1712 Unilateral primary osteoarthritis, left knee: Secondary | ICD-10-CM | POA: Diagnosis not present

## 2022-05-06 DIAGNOSIS — G8929 Other chronic pain: Secondary | ICD-10-CM | POA: Diagnosis not present

## 2022-05-06 DIAGNOSIS — E1122 Type 2 diabetes mellitus with diabetic chronic kidney disease: Secondary | ICD-10-CM | POA: Diagnosis not present

## 2022-05-06 DIAGNOSIS — M4802 Spinal stenosis, cervical region: Secondary | ICD-10-CM | POA: Diagnosis not present

## 2022-05-06 DIAGNOSIS — D631 Anemia in chronic kidney disease: Secondary | ICD-10-CM | POA: Diagnosis not present

## 2022-05-06 DIAGNOSIS — M5136 Other intervertebral disc degeneration, lumbar region: Secondary | ICD-10-CM | POA: Diagnosis not present

## 2022-05-06 DIAGNOSIS — E1142 Type 2 diabetes mellitus with diabetic polyneuropathy: Secondary | ICD-10-CM | POA: Diagnosis not present

## 2022-05-06 DIAGNOSIS — Z79899 Other long term (current) drug therapy: Secondary | ICD-10-CM | POA: Diagnosis not present

## 2022-05-06 DIAGNOSIS — J441 Chronic obstructive pulmonary disease with (acute) exacerbation: Secondary | ICD-10-CM | POA: Diagnosis not present

## 2022-05-06 DIAGNOSIS — Z9981 Dependence on supplemental oxygen: Secondary | ICD-10-CM | POA: Diagnosis not present

## 2022-05-06 DIAGNOSIS — Z7982 Long term (current) use of aspirin: Secondary | ICD-10-CM | POA: Diagnosis not present

## 2022-05-06 DIAGNOSIS — Z87891 Personal history of nicotine dependence: Secondary | ICD-10-CM | POA: Diagnosis not present

## 2022-05-16 DIAGNOSIS — I10 Essential (primary) hypertension: Secondary | ICD-10-CM | POA: Diagnosis not present

## 2022-05-16 DIAGNOSIS — I2510 Atherosclerotic heart disease of native coronary artery without angina pectoris: Secondary | ICD-10-CM | POA: Diagnosis not present

## 2022-05-16 DIAGNOSIS — R55 Syncope and collapse: Secondary | ICD-10-CM | POA: Diagnosis not present

## 2022-05-18 ENCOUNTER — Other Ambulatory Visit: Payer: Self-pay | Admitting: Family

## 2022-05-18 DIAGNOSIS — J209 Acute bronchitis, unspecified: Secondary | ICD-10-CM

## 2022-05-22 ENCOUNTER — Telehealth: Payer: Self-pay | Admitting: Family

## 2022-05-22 NOTE — Telephone Encounter (Signed)
FYI

## 2022-05-23 ENCOUNTER — Encounter: Payer: Self-pay | Admitting: Family

## 2022-05-23 ENCOUNTER — Ambulatory Visit (INDEPENDENT_AMBULATORY_CARE_PROVIDER_SITE_OTHER): Payer: Medicare Other | Admitting: Family

## 2022-05-23 VITALS — BP 113/73 | HR 73 | Temp 97.8°F | Ht 68.0 in

## 2022-05-23 DIAGNOSIS — E785 Hyperlipidemia, unspecified: Secondary | ICD-10-CM

## 2022-05-23 DIAGNOSIS — I5032 Chronic diastolic (congestive) heart failure: Secondary | ICD-10-CM | POA: Diagnosis not present

## 2022-05-23 DIAGNOSIS — F419 Anxiety disorder, unspecified: Secondary | ICD-10-CM

## 2022-05-23 DIAGNOSIS — I130 Hypertensive heart and chronic kidney disease with heart failure and stage 1 through stage 4 chronic kidney disease, or unspecified chronic kidney disease: Secondary | ICD-10-CM | POA: Diagnosis not present

## 2022-05-23 DIAGNOSIS — I10 Essential (primary) hypertension: Secondary | ICD-10-CM

## 2022-05-23 DIAGNOSIS — J441 Chronic obstructive pulmonary disease with (acute) exacerbation: Secondary | ICD-10-CM | POA: Diagnosis not present

## 2022-05-23 DIAGNOSIS — M549 Dorsalgia, unspecified: Secondary | ICD-10-CM | POA: Diagnosis not present

## 2022-05-23 DIAGNOSIS — M1712 Unilateral primary osteoarthritis, left knee: Secondary | ICD-10-CM

## 2022-05-23 DIAGNOSIS — G8929 Other chronic pain: Secondary | ICD-10-CM | POA: Diagnosis not present

## 2022-05-23 DIAGNOSIS — R7303 Prediabetes: Secondary | ICD-10-CM | POA: Diagnosis not present

## 2022-05-23 DIAGNOSIS — Z6841 Body Mass Index (BMI) 40.0 and over, adult: Secondary | ICD-10-CM

## 2022-05-23 DIAGNOSIS — N1832 Chronic kidney disease, stage 3b: Secondary | ICD-10-CM

## 2022-05-23 LAB — BAYER DCA HB A1C WAIVED: HB A1C (BAYER DCA - WAIVED): 7.3 % — ABNORMAL HIGH (ref 4.8–5.6)

## 2022-05-23 MED ORDER — ACCU-CHEK SOFTCLIX LANCETS MISC: 9 refills | Status: DC

## 2022-05-23 NOTE — Patient Instructions (Signed)

## 2022-05-23 NOTE — Progress Notes (Signed)
Subjective:    Patient ID: Marcus Carr, male    DOB: [birth year], 82 y.o.   MRN: [id number]  Chief Complaint  Patient presents with   Medical Management of Chronic Issues   Pt presents to the office today for  chronic follow up. PT has history of osteomyelitis of mandible, doing stable at this time. Pt is followed by Pain Management every 3 months for chronic back pain.   Pt has had elevated glucose and diagnosed as prediabetes.    He has CKD and tries to limit NSAID's. He is morbid obese with a BMI of 42.  He is followed by Cardiologists for syncope and had a loop recorder place.  Hypertension This is a chronic problem. The current episode started more than 1 year ago. The problem is unchanged. The problem is controlled. Associated symptoms include anxiety and shortness of breath. Pertinent negatives include no malaise/fatigue or peripheral edema. Risk factors for coronary artery disease include obesity, male gender and dyslipidemia. The current treatment provides moderate improvement.  Congestive Heart Failure Presents for follow-up visit. Associated symptoms include fatigue and shortness of breath. The symptoms have been stable.  Hyperlipidemia This is a chronic problem. The current episode started more than 1 year ago. The problem is controlled. Recent lipid tests were reviewed and are normal. Exacerbating diseases include obesity. Associated symptoms include shortness of breath. The current treatment provides moderate improvement of lipids. Risk factors for coronary artery disease include dyslipidemia, hypertension, male sex and a sedentary lifestyle.  Back Pain This is a chronic problem. The current episode started more than 1 year ago. The problem occurs intermittently. The problem has been waxing and waning since onset. The pain is present in the lumbar spine. The quality of the pain is described as aching. The pain is at a severity of 4/10. The pain is moderate. He has tried  bed rest for the symptoms. The treatment provided moderate relief.  Anxiety Presents for follow-up visit. Symptoms include excessive worry, irritability, nervous/anxious behavior and shortness of breath. Symptoms occur occasionally. The severity of symptoms is moderate.        Review of Systems  Constitutional:  Positive for fatigue and irritability. Negative for malaise/fatigue.  Respiratory:  Positive for shortness of breath.   Musculoskeletal:  Positive for back pain.  Psychiatric/Behavioral:  The patient is nervous/anxious.   All other systems reviewed and are negative.      Objective:   Physical Exam Vitals reviewed.  Constitutional:      General: He is not in acute distress.    Appearance: He is well-developed. He is obese.  HENT:     Head: Normocephalic.     Right Ear: Tympanic membrane normal.     Left Ear: Tympanic membrane normal.  Eyes:     General:        Right eye: No discharge.        Left eye: No discharge.     Pupils: Pupils are equal, round, and reactive to light.  Neck:     Thyroid: No thyromegaly.  Cardiovascular:     Rate and Rhythm: Normal rate and regular rhythm.     Heart sounds: Normal heart sounds. No murmur heard. Pulmonary:     Effort: Pulmonary effort is normal. No respiratory distress.     Breath sounds: Wheezing and rhonchi present.  Abdominal:     General: Bowel sounds are normal. There is no distension.     Palpations: Abdomen is soft.  Tenderness: There is no abdominal tenderness.  Musculoskeletal:        General: No tenderness. Normal range of motion.     Cervical back: Normal range of motion and neck supple.  Skin:    General: Skin is warm and dry.     Findings: No erythema or rash.  Neurological:     Mental Status: He is alert and oriented to person, place, and time.     Cranial Nerves: No cranial nerve deficit.     Deep Tendon Reflexes: Reflexes are normal and symmetric.  Psychiatric:        Behavior: Behavior normal.         Thought Content: Thought content normal.        Judgment: Judgment normal.       BP 113/73   Pulse 73   Temp 97.8 F (36.6 C) (Temporal)   Ht '5\' 8"'$  (1.727 m)   SpO2 95%   BMI 42.73 kg/m      Assessment & Plan:  Marcus Carr comes in today with chief complaint of Medical Management of Chronic Issues   Diagnosis and orders addressed:  1. Primary hypertension - CMP14+EGFR - CBC with Differential/Platelet  2. Stage 3b chronic kidney disease (Marcus Carr) - CMP14+EGFR - CBC with Differential/Platelet  3. Hyperlipidemia, unspecified hyperlipidemia type - CMP14+EGFR - CBC with Differential/Platelet - Lipid panel  4. Morbid obesity (Marcus Carr) - CMP14+EGFR - CBC with Differential/Platelet  5. Anxiety - CMP14+EGFR - CBC with Differential/Platelet  6. Chronic bilateral back pain, unspecified back location - CMP14+EGFR - CBC with Differential/Platelet  7. COPD with acute exacerbation (HCC) - CMP14+EGFR - CBC with Differential/Platelet  8. Chronic diastolic CHF (congestive heart failure) (HCC) - CMP14+EGFR - CBC with Differential/Platelet  9. Primary osteoarthritis of left knee - CMP14+EGFR - CBC with Differential/Platelet  10. Prediabetes - CMP14+EGFR - CBC with Differential/Platelet - Bayer DCA Hb A1c Waived - Accu-Chek Softclix Lancets lancets; USE UP TO 4 TIMES DAILY AS DIRECTED (E10.9, E11.9)  Dispense: 100 each; Refill: 9   Labs pending Health Maintenance reviewed Diet and exercise encouraged  Follow up plan: 3 months   Marcus Dun, FNP

## 2022-05-24 LAB — CBC WITH DIFFERENTIAL/PLATELET
Basophils Absolute: 0 10*3/uL (ref 0.0–0.2)
Basos: 1 %
EOS (ABSOLUTE): 0 10*3/uL (ref 0.0–0.4)
Eos: 0 %
Hematocrit: 38.3 % (ref 37.5–51.0)
Hemoglobin: 12 g/dL — ABNORMAL LOW (ref 13.0–17.7)
Immature Grans (Abs): 0 10*3/uL (ref 0.0–0.1)
Immature Granulocytes: 0 %
Lymphocytes Absolute: 1 10*3/uL (ref 0.7–3.1)
Lymphs: 19 %
MCH: 25.9 pg — ABNORMAL LOW (ref 26.6–33.0)
MCHC: 31.3 g/dL — ABNORMAL LOW (ref 31.5–35.7)
MCV: 83 fL (ref 79–97)
Monocytes Absolute: 0.5 10*3/uL (ref 0.1–0.9)
Monocytes: 9 %
Neutrophils Absolute: 3.8 10*3/uL (ref 1.4–7.0)
Neutrophils: 71 %
Platelets: 196 10*3/uL (ref 150–450)
RBC: 4.64 x10E6/uL (ref 4.14–5.80)
RDW: 16.4 % — ABNORMAL HIGH (ref 11.6–15.4)
WBC: 5.3 10*3/uL (ref 3.4–10.8)

## 2022-05-24 LAB — CMP14+EGFR
ALT: 12 IU/L (ref 0–44)
AST: 18 IU/L (ref 0–40)
Albumin/Globulin Ratio: 1.3 (ref 1.2–2.2)
Albumin: 4.1 g/dL (ref 3.8–4.8)
Alkaline Phosphatase: 126 IU/L — ABNORMAL HIGH (ref 44–121)
BUN/Creatinine Ratio: 17 (ref 10–24)
BUN: 26 mg/dL (ref 8–27)
Bilirubin Total: 0.3 mg/dL (ref 0.0–1.2)
CO2: 27 mmol/L (ref 20–29)
Calcium: 9.4 mg/dL (ref 8.6–10.2)
Chloride: 99 mmol/L (ref 96–106)
Creatinine, Ser: 1.55 mg/dL — ABNORMAL HIGH (ref 0.76–1.27)
Globulin, Total: 3.1 g/dL (ref 1.5–4.5)
Glucose: 98 mg/dL (ref 70–99)
Potassium: 4.3 mmol/L (ref 3.5–5.2)
Sodium: 140 mmol/L (ref 134–144)
Total Protein: 7.2 g/dL (ref 6.0–8.5)
eGFR: 45 mL/min/{1.73_m2} — ABNORMAL LOW (ref >59)

## 2022-05-24 LAB — LIPID PANEL
Chol/HDL Ratio: 4.3 ratio (ref 0.0–5.0)
Cholesterol, Total: 138 mg/dL (ref 100–199)
HDL: 32 mg/dL — ABNORMAL LOW (ref >39)
LDL Chol Calc (NIH): 83 mg/dL (ref 0–99)
Triglycerides: 129 mg/dL (ref 0–149)
VLDL Cholesterol Cal: 23 mg/dL (ref 5–40)

## 2022-05-25 DIAGNOSIS — U071 COVID-19: Secondary | ICD-10-CM | POA: Diagnosis not present

## 2022-05-25 DIAGNOSIS — J441 Chronic obstructive pulmonary disease with (acute) exacerbation: Secondary | ICD-10-CM | POA: Diagnosis not present

## 2022-05-26 ENCOUNTER — Other Ambulatory Visit: Payer: Self-pay | Admitting: Family

## 2022-05-26 DIAGNOSIS — Z87891 Personal history of nicotine dependence: Secondary | ICD-10-CM | POA: Diagnosis not present

## 2022-05-26 DIAGNOSIS — D631 Anemia in chronic kidney disease: Secondary | ICD-10-CM | POA: Diagnosis not present

## 2022-05-26 DIAGNOSIS — N1832 Chronic kidney disease, stage 3b: Secondary | ICD-10-CM | POA: Diagnosis not present

## 2022-05-26 DIAGNOSIS — I130 Hypertensive heart and chronic kidney disease with heart failure and stage 1 through stage 4 chronic kidney disease, or unspecified chronic kidney disease: Secondary | ICD-10-CM | POA: Diagnosis not present

## 2022-05-26 DIAGNOSIS — M109 Gout, unspecified: Secondary | ICD-10-CM | POA: Diagnosis not present

## 2022-05-26 DIAGNOSIS — G8929 Other chronic pain: Secondary | ICD-10-CM | POA: Diagnosis not present

## 2022-05-26 DIAGNOSIS — J441 Chronic obstructive pulmonary disease with (acute) exacerbation: Secondary | ICD-10-CM | POA: Diagnosis not present

## 2022-05-26 DIAGNOSIS — E1142 Type 2 diabetes mellitus with diabetic polyneuropathy: Secondary | ICD-10-CM | POA: Diagnosis not present

## 2022-05-26 DIAGNOSIS — Z7952 Long term (current) use of systemic steroids: Secondary | ICD-10-CM | POA: Diagnosis not present

## 2022-05-26 DIAGNOSIS — M5021 Other cervical disc displacement,  high cervical region: Secondary | ICD-10-CM | POA: Diagnosis not present

## 2022-05-26 DIAGNOSIS — M1712 Unilateral primary osteoarthritis, left knee: Secondary | ICD-10-CM | POA: Diagnosis not present

## 2022-05-26 DIAGNOSIS — E785 Hyperlipidemia, unspecified: Secondary | ICD-10-CM | POA: Diagnosis not present

## 2022-05-26 DIAGNOSIS — Z79899 Other long term (current) drug therapy: Secondary | ICD-10-CM | POA: Diagnosis not present

## 2022-05-26 DIAGNOSIS — Z9981 Dependence on supplemental oxygen: Secondary | ICD-10-CM | POA: Diagnosis not present

## 2022-05-26 DIAGNOSIS — Z7982 Long term (current) use of aspirin: Secondary | ICD-10-CM | POA: Diagnosis not present

## 2022-05-26 DIAGNOSIS — Z7984 Long term (current) use of oral hypoglycemic drugs: Secondary | ICD-10-CM | POA: Diagnosis not present

## 2022-05-26 DIAGNOSIS — M4802 Spinal stenosis, cervical region: Secondary | ICD-10-CM | POA: Diagnosis not present

## 2022-05-26 DIAGNOSIS — M5136 Other intervertebral disc degeneration, lumbar region: Secondary | ICD-10-CM | POA: Diagnosis not present

## 2022-05-26 DIAGNOSIS — E1122 Type 2 diabetes mellitus with diabetic chronic kidney disease: Secondary | ICD-10-CM | POA: Diagnosis not present

## 2022-05-26 DIAGNOSIS — I5032 Chronic diastolic (congestive) heart failure: Secondary | ICD-10-CM | POA: Diagnosis not present

## 2022-05-26 MED ORDER — DAPAGLIFLOZIN PROPANEDIOL 5 MG PO TABS: 5.0000 mg | ORAL_TABLET | Freq: Every day | ORAL | 1 refills | Status: DC

## 2022-05-27 ENCOUNTER — Other Ambulatory Visit: Payer: Self-pay | Admitting: Family

## 2022-05-27 ENCOUNTER — Telehealth: Payer: Self-pay | Admitting: Family

## 2022-05-27 DIAGNOSIS — E1142 Type 2 diabetes mellitus with diabetic polyneuropathy: Secondary | ICD-10-CM

## 2022-05-27 NOTE — Telephone Encounter (Signed)
Lmtcb.

## 2022-05-27 NOTE — Telephone Encounter (Signed)
Patients wife calling because the medication that was called in for the patients diabetes is too expensive. They are wanting to know if  glimepiride (AMARYL) 1 MG tablet can be called in instead. Please call back and advise .

## 2022-05-27 NOTE — Telephone Encounter (Signed)
Ok, well lets do strict low carb diet. He does need follow up with [facility] for diabetic education.

## 2022-05-28 DIAGNOSIS — U071 COVID-19: Secondary | ICD-10-CM | POA: Diagnosis not present

## 2022-05-28 DIAGNOSIS — J441 Chronic obstructive pulmonary disease with (acute) exacerbation: Secondary | ICD-10-CM | POA: Diagnosis not present

## 2022-05-28 NOTE — Telephone Encounter (Signed)
Patient aware and verbalized understanding. °

## 2022-05-28 NOTE — Telephone Encounter (Signed)
Please call patients wife back. She can't remember what nurse told her.

## 2022-05-29 ENCOUNTER — Other Ambulatory Visit: Payer: Self-pay | Admitting: Family

## 2022-05-29 DIAGNOSIS — E785 Hyperlipidemia, unspecified: Secondary | ICD-10-CM

## 2022-05-29 DIAGNOSIS — E8881 Metabolic syndrome: Secondary | ICD-10-CM

## 2022-06-19 ENCOUNTER — Other Ambulatory Visit: Payer: Self-pay | Admitting: Family

## 2022-06-19 DIAGNOSIS — R2243 Localized swelling, mass and lump, lower limb, bilateral: Secondary | ICD-10-CM

## 2022-06-19 DIAGNOSIS — I10 Essential (primary) hypertension: Secondary | ICD-10-CM

## 2022-06-19 DIAGNOSIS — E1142 Type 2 diabetes mellitus with diabetic polyneuropathy: Secondary | ICD-10-CM

## 2022-06-19 DIAGNOSIS — F419 Anxiety disorder, unspecified: Secondary | ICD-10-CM

## 2022-06-25 DIAGNOSIS — J441 Chronic obstructive pulmonary disease with (acute) exacerbation: Secondary | ICD-10-CM | POA: Diagnosis not present

## 2022-06-25 DIAGNOSIS — U071 COVID-19: Secondary | ICD-10-CM | POA: Diagnosis not present

## 2022-06-28 DIAGNOSIS — J441 Chronic obstructive pulmonary disease with (acute) exacerbation: Secondary | ICD-10-CM | POA: Diagnosis not present

## 2022-06-28 DIAGNOSIS — U071 COVID-19: Secondary | ICD-10-CM | POA: Diagnosis not present

## 2022-07-14 ENCOUNTER — Other Ambulatory Visit: Payer: Self-pay | Admitting: Family

## 2022-07-14 DIAGNOSIS — R2243 Localized swelling, mass and lump, lower limb, bilateral: Secondary | ICD-10-CM

## 2022-07-14 DIAGNOSIS — E1142 Type 2 diabetes mellitus with diabetic polyneuropathy: Secondary | ICD-10-CM

## 2022-07-14 DIAGNOSIS — F419 Anxiety disorder, unspecified: Secondary | ICD-10-CM

## 2022-07-14 DIAGNOSIS — I10 Essential (primary) hypertension: Secondary | ICD-10-CM

## 2022-07-25 DIAGNOSIS — J441 Chronic obstructive pulmonary disease with (acute) exacerbation: Secondary | ICD-10-CM | POA: Diagnosis not present

## 2022-07-25 DIAGNOSIS — U071 COVID-19: Secondary | ICD-10-CM | POA: Diagnosis not present

## 2022-07-28 ENCOUNTER — Ambulatory Visit: Payer: Medicare Other | Admitting: Family Medicine

## 2022-07-28 DIAGNOSIS — U071 COVID-19: Secondary | ICD-10-CM | POA: Diagnosis not present

## 2022-07-28 DIAGNOSIS — J441 Chronic obstructive pulmonary disease with (acute) exacerbation: Secondary | ICD-10-CM | POA: Diagnosis not present

## 2022-08-01 ENCOUNTER — Other Ambulatory Visit: Payer: Self-pay | Admitting: Family

## 2022-08-01 DIAGNOSIS — J209 Acute bronchitis, unspecified: Secondary | ICD-10-CM

## 2022-08-06 DIAGNOSIS — Z79899 Other long term (current) drug therapy: Secondary | ICD-10-CM | POA: Diagnosis not present

## 2022-08-06 DIAGNOSIS — M5416 Radiculopathy, lumbar region: Secondary | ICD-10-CM | POA: Diagnosis not present

## 2022-08-06 DIAGNOSIS — Z5181 Encounter for therapeutic drug level monitoring: Secondary | ICD-10-CM | POA: Diagnosis not present

## 2022-08-06 DIAGNOSIS — M5136 Other intervertebral disc degeneration, lumbar region: Secondary | ICD-10-CM | POA: Diagnosis not present

## 2022-08-06 DIAGNOSIS — Z79891 Long term (current) use of opiate analgesic: Secondary | ICD-10-CM | POA: Diagnosis not present

## 2022-08-25 DIAGNOSIS — U071 COVID-19: Secondary | ICD-10-CM | POA: Diagnosis not present

## 2022-08-25 DIAGNOSIS — J441 Chronic obstructive pulmonary disease with (acute) exacerbation: Secondary | ICD-10-CM | POA: Diagnosis not present

## 2022-08-26 ENCOUNTER — Ambulatory Visit: Payer: Medicare Other | Admitting: Family

## 2022-08-26 ENCOUNTER — Other Ambulatory Visit: Payer: Self-pay | Admitting: Family

## 2022-08-28 DIAGNOSIS — J441 Chronic obstructive pulmonary disease with (acute) exacerbation: Secondary | ICD-10-CM | POA: Diagnosis not present

## 2022-08-28 DIAGNOSIS — U071 COVID-19: Secondary | ICD-10-CM | POA: Diagnosis not present

## 2022-09-07 ENCOUNTER — Other Ambulatory Visit: Payer: Self-pay | Admitting: Family

## 2022-09-07 DIAGNOSIS — I10 Essential (primary) hypertension: Secondary | ICD-10-CM

## 2022-09-08 ENCOUNTER — Other Ambulatory Visit: Payer: Self-pay | Admitting: Family

## 2022-09-08 DIAGNOSIS — E8881 Metabolic syndrome: Secondary | ICD-10-CM

## 2022-09-08 DIAGNOSIS — E785 Hyperlipidemia, unspecified: Secondary | ICD-10-CM

## 2022-09-24 ENCOUNTER — Other Ambulatory Visit: Payer: Self-pay | Admitting: Family

## 2022-09-24 DIAGNOSIS — E1142 Type 2 diabetes mellitus with diabetic polyneuropathy: Secondary | ICD-10-CM

## 2022-09-27 DIAGNOSIS — U071 COVID-19: Secondary | ICD-10-CM | POA: Diagnosis not present

## 2022-09-27 DIAGNOSIS — J441 Chronic obstructive pulmonary disease with (acute) exacerbation: Secondary | ICD-10-CM | POA: Diagnosis not present

## 2022-10-12 ENCOUNTER — Other Ambulatory Visit: Payer: Self-pay | Admitting: Family

## 2022-10-12 DIAGNOSIS — I10 Essential (primary) hypertension: Secondary | ICD-10-CM

## 2022-10-12 DIAGNOSIS — R2243 Localized swelling, mass and lump, lower limb, bilateral: Secondary | ICD-10-CM

## 2022-10-12 DIAGNOSIS — F419 Anxiety disorder, unspecified: Secondary | ICD-10-CM

## 2022-10-13 ENCOUNTER — Encounter: Payer: Self-pay | Admitting: Family

## 2022-10-13 NOTE — Telephone Encounter (Signed)
Refill given but patient needs an appointment for any further refills.

## 2022-10-13 NOTE — Telephone Encounter (Signed)
LMTCB to schedule appt Letter mailed 

## 2022-10-28 DIAGNOSIS — J441 Chronic obstructive pulmonary disease with (acute) exacerbation: Secondary | ICD-10-CM | POA: Diagnosis not present

## 2022-10-28 DIAGNOSIS — U071 COVID-19: Secondary | ICD-10-CM | POA: Diagnosis not present

## 2022-11-04 ENCOUNTER — Telehealth: Payer: Self-pay | Admitting: Family

## 2022-11-05 NOTE — Telephone Encounter (Signed)
Called and spoke with patients wife. She is aware [facility] is out and will send to her for when she comes in Thursday to see if she can get it sent to the pharmacy before appt.

## 2022-11-06 ENCOUNTER — Ambulatory Visit (INDEPENDENT_AMBULATORY_CARE_PROVIDER_SITE_OTHER): Payer: Medicare Other | Admitting: Family

## 2022-11-06 ENCOUNTER — Encounter: Payer: Self-pay | Admitting: Family

## 2022-11-06 VITALS — BP 134/85 | HR 68 | Temp 98.5°F | Ht 68.0 in

## 2022-11-06 DIAGNOSIS — J9611 Chronic respiratory failure with hypoxia: Secondary | ICD-10-CM

## 2022-11-06 DIAGNOSIS — I10 Essential (primary) hypertension: Secondary | ICD-10-CM

## 2022-11-06 DIAGNOSIS — Z23 Encounter for immunization: Secondary | ICD-10-CM | POA: Diagnosis not present

## 2022-11-06 DIAGNOSIS — I5032 Chronic diastolic (congestive) heart failure: Secondary | ICD-10-CM

## 2022-11-06 DIAGNOSIS — R7303 Prediabetes: Secondary | ICD-10-CM

## 2022-11-06 DIAGNOSIS — J441 Chronic obstructive pulmonary disease with (acute) exacerbation: Secondary | ICD-10-CM

## 2022-11-06 DIAGNOSIS — N1832 Chronic kidney disease, stage 3b: Secondary | ICD-10-CM

## 2022-11-06 DIAGNOSIS — Z0000 Encounter for general adult medical examination without abnormal findings: Secondary | ICD-10-CM | POA: Diagnosis not present

## 2022-11-06 DIAGNOSIS — Z0001 Encounter for general adult medical examination with abnormal findings: Secondary | ICD-10-CM

## 2022-11-06 DIAGNOSIS — Z9981 Dependence on supplemental oxygen: Secondary | ICD-10-CM | POA: Insufficient documentation

## 2022-11-06 DIAGNOSIS — M1712 Unilateral primary osteoarthritis, left knee: Secondary | ICD-10-CM | POA: Diagnosis not present

## 2022-11-06 DIAGNOSIS — F419 Anxiety disorder, unspecified: Secondary | ICD-10-CM | POA: Diagnosis not present

## 2022-11-06 DIAGNOSIS — R300 Dysuria: Secondary | ICD-10-CM | POA: Diagnosis not present

## 2022-11-06 DIAGNOSIS — R531 Weakness: Secondary | ICD-10-CM

## 2022-11-06 DIAGNOSIS — E785 Hyperlipidemia, unspecified: Secondary | ICD-10-CM

## 2022-11-06 LAB — URINALYSIS, COMPLETE
Bilirubin, UA: NEGATIVE
Glucose, UA: NEGATIVE
Ketones, UA: NEGATIVE
Nitrite, UA: NEGATIVE
Protein,UA: NEGATIVE
Specific Gravity, UA: 1.02 (ref 1.005–1.030)
Urobilinogen, Ur: 0.2 mg/dL (ref 0.2–1.0)
pH, UA: 8 — ABNORMAL HIGH (ref 5.0–7.5)

## 2022-11-06 LAB — MICROSCOPIC EXAMINATION
Bacteria, UA: NONE SEEN
Yeast, UA: NONE SEEN

## 2022-11-06 LAB — BAYER DCA HB A1C WAIVED: HB A1C (BAYER DCA - WAIVED): 6.5 % — ABNORMAL HIGH (ref 4.8–5.6)

## 2022-11-06 MED ORDER — PREDNISONE 10 MG (21) PO TBPK
ORAL_TABLET | ORAL | 0 refills | Status: DC
Start: 2022-11-06 — End: 2023-02-02

## 2022-11-06 NOTE — Progress Notes (Signed)
Subjective:    Patient ID: Marcus Carr, male    DOB: 1940-12-10, 82 y.o.   MRN: [id number]  Chief Complaint  Patient presents with   Medical Management of Chronic Issues   Pt presents to the office today for  CPE and chronic follow up. PT has history of osteomyelitis of mandible, doing stable at this time. Pt is followed by Pain Management every 3 months for chronic back pain.    Pt has had elevated glucose and diagnosed as prediabetes.    He has CKD and tries to limit NSAID's. He is morbid obese with a BMI of 42.   He is followed by Cardiologists for syncope and CHF. Has not seen them recently.   He has COPD and chronic respiratory failure and uses 2 L of O2. He quit smoking 2016. Hypertension This is a chronic problem. The current episode started more than 1 year ago. The problem has been waxing and waning since onset. The problem is uncontrolled. Associated symptoms include anxiety, malaise/fatigue, peripheral edema and shortness of breath. Pertinent negatives include no blurred vision. Risk factors for coronary artery disease include dyslipidemia, obesity, male gender and sedentary lifestyle. The current treatment provides mild improvement. Hypertensive end-organ damage includes heart failure.  Congestive Heart Failure Presents for follow-up visit. Associated symptoms include edema, fatigue, nocturia and shortness of breath. The symptoms have been stable.  Arthritis Presents for follow-up visit. He complains of pain and stiffness. Affected locations include the left knee, right knee, left MCP, right MCP, left hip and right hip. His pain is at a severity of 7/10. Associated symptoms include fatigue.  Diabetes He presents for his follow-up diabetic visit. Diabetes type: prediabetic. Hypoglycemia symptoms include nervousness/anxiousness. Associated symptoms include fatigue. Pertinent negatives for diabetes include no blurred vision and no foot paresthesias. Diabetic complications  include peripheral neuropathy. Risk factors for coronary artery disease include dyslipidemia, diabetes mellitus, hypertension, sedentary lifestyle and post-menopausal. He is following a generally unhealthy diet. His overall blood glucose range is 110-130 mg/dl.  Hyperlipidemia This is a chronic problem. The current episode started more than 1 year ago. The problem is controlled. Recent lipid tests were reviewed and are normal. Exacerbating diseases include obesity. Associated symptoms include shortness of breath. Current antihyperlipidemic treatment includes statins. The current treatment provides moderate improvement of lipids. Risk factors for coronary artery disease include dyslipidemia, diabetes mellitus, hypertension, male sex, obesity and a sedentary lifestyle.  Anxiety Presents for follow-up visit. Symptoms include excessive worry, nervous/anxious behavior and shortness of breath. Symptoms occur occasionally. The severity of symptoms is moderate.       Review of Systems  Constitutional:  Positive for fatigue and malaise/fatigue.  Eyes:  Negative for blurred vision.  Respiratory:  Positive for shortness of breath.   Genitourinary:  Positive for nocturia.  Musculoskeletal:  Positive for arthritis and stiffness.  Psychiatric/Behavioral:  The patient is nervous/anxious.   All other systems reviewed and are negative.  Family History  Problem Relation Age of Onset   Cancer Mother        esophageal   Heart attack Father 57   Social History   Socioeconomic History   Marital status: Married    Spouse name: Rosa   Number of children: 2   Years of education: 8   Highest education level: 8th grade  Occupational History   Occupation: Retired  Tobacco Use   Smoking status: Former    Current packs/day: 0.00    Average packs/day: 1.5 packs/day for 50.0 years (75.0  ttl pk-yrs)    Types: Cigarettes    Start date: 10/23/1964    Quit date: 10/24/2014    Years since quitting: 8.0   Smokeless  tobacco: Never  Vaping Use   Vaping status: Never Used  Substance and Sexual Activity   Alcohol use: No    Alcohol/week: 0.0 standard drinks of alcohol   Drug use: No   Sexual activity: Not Currently  Other Topics Concern   Not on file  Social History Narrative   Lives wife and grandson stays with them a lot.  Two daughters - both live within 10 miles   Social Determinants of Health   Financial Resource Strain: Low Risk  (01/22/2022)   Received from [facility]   Overall Financial Resource Strain (CARDIA)    Difficulty of Paying Living Expenses: Not very hard  Food Insecurity: No Food Insecurity (01/16/2022)   Hunger Vital Sign    Worried About Running Out of Food in the Last Year: Never true    Ran Out of Food in the Last Year: Never true  Transportation Needs: No Transportation Needs (01/16/2022)   PRAPARE - Administrator, Civil Service (Medical): No    Lack of Transportation (Non-Medical): No  Physical Activity: Inactive (01/16/2022)   Exercise Vital Sign    Days of Exercise per Week: 0 days    Minutes of Exercise per Session: 0 min  Stress: No Stress Concern Present (01/22/2022)   Received from [facility], [facility] - Occupational Stress Questionnaire    Feeling of Stress : Only a little  Social Connections: Moderately Isolated (01/16/2022)   Social Connection and Isolation Panel [NHANES]    Frequency of Communication with Friends and Family: Three times a week    Frequency of Social Gatherings with Friends and Family: Twice a week    Attends Religious Services: Never    Database administrator or Organizations: No    Attends Banker Meetings: Never    Marital Status: Married       Objective:   Physical Exam Vitals reviewed.  Constitutional:      General: He is not in acute distress.    Appearance: He is well-developed. He is obese.  HENT:     Head: Normocephalic.      Right Ear: Tympanic membrane normal.     Left Ear: Tympanic membrane normal.  Eyes:     General:        Right eye: No discharge.        Left eye: No discharge.     Pupils: Pupils are equal, round, and reactive to light.  Neck:     Thyroid: No thyromegaly.  Cardiovascular:     Rate and Rhythm: Normal rate and regular rhythm.     Heart sounds: Normal heart sounds. No murmur heard. Pulmonary:     Effort: Pulmonary effort is normal. No respiratory distress.     Breath sounds: Wheezing and rhonchi present.  Abdominal:     General: Bowel sounds are normal. There is no distension.     Palpations: Abdomen is soft.     Tenderness: There is no abdominal tenderness.  Musculoskeletal:        General: No tenderness. Normal range of motion.     Cervical back: Normal range of motion and neck supple.     Right lower leg: Edema (2+) present.     Left lower leg: Edema (2+) present.  Skin:    General: Skin is warm and dry.     Findings: No erythema or rash.  Neurological:     Mental Status: He is alert and oriented to person, place, and time.     Cranial Nerves: No cranial nerve deficit.     Motor: Weakness present.     Gait: Gait abnormal (in wheelchair).     Deep Tendon Reflexes: Reflexes are normal and symmetric.  Psychiatric:        Behavior: Behavior normal.        Thought Content: Thought content normal.        Judgment: Judgment normal.      BP 134/85 Comment: pt reports at home  Pulse 68   Temp 98.5 F (36.9 C) (Temporal)   Ht 5\' 8"  (1.727 m)   SpO2 93%   BMI 42.73 kg/m      Assessment & Plan:  Knut Zamani comes in today with chief complaint of Medical Management of Chronic Issues   Diagnosis and orders addressed:  1. Annual physical exam - CBC with Differential/Platelet - CMP14+EGFR  2. Anxiety - CBC with Differential/Platelet - CMP14+EGFR  3. Chronic diastolic CHF (congestive heart failure) (HCC) - CBC with Differential/Platelet - CMP14+EGFR  4.  Stage 3b chronic kidney disease (HCC) - CBC with Differential/Platelet - CMP14+EGFR  5. COPD with acute exacerbation (HCC) - CBC with Differential/Platelet - CMP14+EGFR  6. Hyperlipidemia, unspecified hyperlipidemia type - CBC with Differential/Platelet - CMP14+EGFR  7. Primary hypertension - CBC with Differential/Platelet - CMP14+EGFR  8. Morbid obesity (HCC) - CBC with Differential/Platelet - CMP14+EGFR  9. Primary osteoarthritis of left knee - CBC with Differential/Platelet - CMP14+EGFR - predniSONE (STERAPRED UNI-PAK 21 TAB) 10 MG (21) TBPK tablet; Use as directed  Dispense: 21 tablet; Refill: 0  10. Prediabetes - Bayer DCA Hb A1c Waived - CBC with Differential/Platelet - CMP14+EGFR  11. Chronic hypoxic respiratory failure, on home oxygen therapy (HCC) - CBC with Differential/Platelet - CMP14+EGFR  12. Dysuria - Urine Culture - Urinalysis, Complete  13. Need for shingles vaccine - Zoster, Recombinant (Shingrix)   Labs pending Health Maintenance reviewed Diet and exercise encouraged  Follow up plan: 3 months    Jannifer Rodney, FNP

## 2022-11-06 NOTE — Telephone Encounter (Signed)
Pt seen today, Prescription sent to pharmacy

## 2022-11-07 LAB — CBC WITH DIFFERENTIAL/PLATELET
Basophils Absolute: 0 10*3/uL (ref 0.0–0.2)
Basos: 0 %
EOS (ABSOLUTE): 0 10*3/uL (ref 0.0–0.4)
Eos: 0 %
Hematocrit: 41.8 % (ref 37.5–51.0)
Hemoglobin: 13.6 g/dL (ref 13.0–17.7)
Immature Grans (Abs): 0 10*3/uL (ref 0.0–0.1)
Immature Granulocytes: 0 %
Lymphocytes Absolute: 0.8 10*3/uL (ref 0.7–3.1)
Lymphs: 9 %
MCH: 28.3 pg (ref 26.6–33.0)
MCHC: 32.5 g/dL (ref 31.5–35.7)
MCV: 87 fL (ref 79–97)
Monocytes Absolute: 0.3 10*3/uL (ref 0.1–0.9)
Monocytes: 4 %
Neutrophils Absolute: 7.9 10*3/uL — ABNORMAL HIGH (ref 1.4–7.0)
Neutrophils: 87 %
Platelets: 169 10*3/uL (ref 150–450)
RBC: 4.8 x10E6/uL (ref 4.14–5.80)
RDW: 15.3 % (ref 11.6–15.4)
WBC: 9.1 10*3/uL (ref 3.4–10.8)

## 2022-11-07 LAB — CMP14+EGFR
ALT: 17 IU/L (ref 0–44)
AST: 23 IU/L (ref 0–40)
Albumin: 4.3 g/dL (ref 3.8–4.8)
Alkaline Phosphatase: 124 IU/L — ABNORMAL HIGH (ref 44–121)
BUN/Creatinine Ratio: 15 (ref 10–24)
BUN: 25 mg/dL (ref 8–27)
Bilirubin Total: 0.4 mg/dL (ref 0.0–1.2)
CO2: 29 mmol/L (ref 20–29)
Calcium: 9.8 mg/dL (ref 8.6–10.2)
Chloride: 97 mmol/L (ref 96–106)
Creatinine, Ser: 1.63 mg/dL — ABNORMAL HIGH (ref 0.76–1.27)
Globulin, Total: 3.2 g/dL (ref 1.5–4.5)
Glucose: 129 mg/dL — ABNORMAL HIGH (ref 70–99)
Potassium: 5 mmol/L (ref 3.5–5.2)
Sodium: 142 mmol/L (ref 134–144)
Total Protein: 7.5 g/dL (ref 6.0–8.5)
eGFR: 42 mL/min/{1.73_m2} — ABNORMAL LOW (ref >59)

## 2022-11-07 LAB — URINE CULTURE

## 2022-11-09 ENCOUNTER — Other Ambulatory Visit: Payer: Self-pay | Admitting: Family

## 2022-11-09 DIAGNOSIS — I10 Essential (primary) hypertension: Secondary | ICD-10-CM

## 2022-11-18 ENCOUNTER — Other Ambulatory Visit: Payer: Self-pay | Admitting: Family Medicine

## 2022-11-18 DIAGNOSIS — R300 Dysuria: Secondary | ICD-10-CM

## 2022-11-20 ENCOUNTER — Other Ambulatory Visit: Payer: Medicare Other

## 2022-11-20 DIAGNOSIS — R300 Dysuria: Secondary | ICD-10-CM | POA: Diagnosis not present

## 2022-11-20 LAB — URINALYSIS, COMPLETE
Bilirubin, UA: NEGATIVE
Glucose, UA: NEGATIVE
Ketones, UA: NEGATIVE
Leukocytes,UA: NEGATIVE
Nitrite, UA: POSITIVE — AB
Protein,UA: NEGATIVE
Specific Gravity, UA: 1.015 (ref 1.005–1.030)
Urobilinogen, Ur: 0.2 mg/dL (ref 0.2–1.0)
pH, UA: 7 (ref 5.0–7.5)

## 2022-11-20 LAB — MICROSCOPIC EXAMINATION
Epithelial Cells (non renal): NONE SEEN /hpf (ref 0–10)
RBC, Urine: NONE SEEN /hpf (ref 0–2)
Renal Epithel, UA: NONE SEEN /hpf
Yeast, UA: NONE SEEN

## 2022-11-21 ENCOUNTER — Other Ambulatory Visit: Payer: Self-pay | Admitting: Family

## 2022-11-21 MED ORDER — CEPHALEXIN 500 MG PO CAPS: 500.0000 mg | ORAL_CAPSULE | Freq: Two times a day (BID) | ORAL | 0 refills | Status: DC

## 2022-11-22 LAB — URINE CULTURE

## 2022-11-25 ENCOUNTER — Other Ambulatory Visit: Payer: Self-pay | Admitting: Family

## 2022-11-28 DIAGNOSIS — J441 Chronic obstructive pulmonary disease with (acute) exacerbation: Secondary | ICD-10-CM | POA: Diagnosis not present

## 2022-11-28 DIAGNOSIS — U071 COVID-19: Secondary | ICD-10-CM | POA: Diagnosis not present

## 2022-12-03 ENCOUNTER — Other Ambulatory Visit: Payer: Self-pay | Admitting: Family

## 2022-12-03 DIAGNOSIS — M79672 Pain in left foot: Secondary | ICD-10-CM

## 2022-12-07 ENCOUNTER — Other Ambulatory Visit: Payer: Self-pay | Admitting: Family

## 2022-12-07 DIAGNOSIS — M79671 Pain in right foot: Secondary | ICD-10-CM

## 2022-12-08 ENCOUNTER — Telehealth (INDEPENDENT_AMBULATORY_CARE_PROVIDER_SITE_OTHER): Payer: Medicare Other | Admitting: Family

## 2022-12-08 ENCOUNTER — Telehealth: Payer: Self-pay | Admitting: Family

## 2022-12-08 ENCOUNTER — Encounter: Payer: Self-pay | Admitting: Family

## 2022-12-08 DIAGNOSIS — R350 Frequency of micturition: Secondary | ICD-10-CM | POA: Diagnosis not present

## 2022-12-08 NOTE — Telephone Encounter (Signed)
Put him in at 3:25 for a video visit with you.

## 2022-12-08 NOTE — Telephone Encounter (Signed)
Please schedule him a video visit with me today.

## 2022-12-08 NOTE — Telephone Encounter (Signed)
Patient left a urine on 8/29 and was given an antibiotic to treat infection. Wife calling because she would like to talk to nurse about having more medication called in. Made her aware that patient would need to come in to office to be reevaluated for this issue. Wife did not want an appt and only wants to talk to PCP nurse about this issue.

## 2022-12-08 NOTE — Progress Notes (Signed)
Virtual Visit Consent   Marcus Carr, you are scheduled for a virtual visit with a [facility] provider today. Just as with appointments in the office, your consent must be obtained to participate. Your consent will be active for this visit and any virtual visit you may have with one of our providers in the next 365 days. If you have a MyChart account, a copy of this consent can be sent to you electronically.  As this is a virtual visit, video technology does not allow for your provider to perform a traditional examination. This may limit your provider's ability to fully assess your condition. If your provider identifies any concerns that need to be evaluated in person or the need to arrange testing (such as labs, EKG, etc.), we will make arrangements to do so. Although advances in technology are sophisticated, we cannot ensure that it will always work on either your end or our end. If the connection with a video visit is poor, the visit may have to be switched to a telephone visit. With either a video or telephone visit, we are not always able to ensure that we have a secure connection.  By engaging in this virtual visit, you consent to the provision of healthcare and authorize for your insurance to be billed (if applicable) for the services provided during this visit. Depending on your insurance coverage, you may receive a charge related to this service.  I need to obtain your verbal consent now. Are you willing to proceed with your visit today? Marcus Carr has provided verbal consent on 12/08/2022 for a virtual visit (video or telephone). Jannifer Rodney, FNP  Date: 12/08/2022 3:35 PM  Virtual Visit via Video Note   I, Jannifer Rodney, connected with  Marcus Carr  ([id number], Aug 02, 1940) on 12/08/22 at 12:10 PM EDT by a video-enabled telemedicine application and verified that I am speaking with the correct person using two identifiers.  Location: Patient: Virtual Visit Location  Patient: Home Provider: Virtual Visit Location Provider: Office/Clinic   I discussed the limitations of evaluation and management by telemedicine and the availability of in person appointments. The patient expressed understanding and agreed to proceed.    History of Present Illness: Marcus Carr is a 82 y.o. who identifies as a male who was assigned male at birth, and is being seen today for urinary frequency. He had a positive urine culture on 11/20/22 that showed, "Greater than 2 organisms recovered, none predominant. Please submit  another sample if clinically indicated. " He completed keflex.   HPI: Urinary Frequency  This is a recurrent problem. The current episode started 1 to 4 weeks ago. The problem occurs intermittently. The problem has been waxing and waning. The pain is at a severity of 0/10. The patient is experiencing no pain. Associated symptoms include frequency and urgency. Pertinent negatives include no hematuria, nausea or vomiting. He has tried antibiotics for the symptoms.    Problems:  Patient Active Problem List   Diagnosis Date Noted   Chronic hypoxic respiratory failure, on home oxygen therapy (HCC) 11/06/2022   Prediabetes 05/23/2022   COPD with acute exacerbation (HCC) 11/20/2021   Acute renal failure superimposed on stage 3b chronic kidney disease (HCC) 11/20/2021   Generalized weakness 11/20/2021   Acute anemia 11/20/2021   Chronic diastolic CHF (congestive heart failure) (HCC) 11/20/2021   Allergic rhinitis 11/20/2021   Osteoarthritis of left knee 09/25/2021   Leg swelling 04/08/2021   Nonspecific abnormal electrocardiogram (ECG) (EKG) 04/08/2021  Degeneration of lumbar intervertebral disc 12/04/2020   Metabolic syndrome 10/24/2015   Abscess, jaw    Osteomyelitis of mandible 11/07/2014   Hypertension 11/07/2014   Gout 11/07/2014   Chronic kidney disease 11/07/2014   Hyperlipidemia 11/07/2014   Morbid obesity (HCC) 11/07/2014   Back pain  11/07/2014   Anxiety 11/07/2014   Chronic mastoiditis of both sides 11/07/2014   Thyroid nodule 11/07/2014   Hx of smoking 11/07/2014   Poor dentition 11/07/2014    Allergies:  Allergies  Allergen Reactions   Penicillins Rash    Other reaction(s): Not available  Did it involve swelling of the face/tongue/throat, SOB, or low BP? No Did it involve sudden or severe rash/hives, skin peeling, or any reaction on the inside of your mouth or nose? Yes Did you need to seek medical attention at a hospital or doctor's office? No When did it last happen?    Over 30 years   If all above answers are "NO", may proceed with cephalosporin use.     Medications:  Current Outpatient Medications:    Accu-Chek Softclix Lancets lancets, USE UP TO 4 TIMES DAILY AS DIRECTED (E10.9, E11.9), Disp: 100 each, Rfl: 9   albuterol (VENTOLIN HFA) 108 (90 Base) MCG/ACT inhaler, INHALE 2 PUFFS INTO THE LUNGS EVERY 6 HOURS AS NEEDED FOR WHEEZE OR SHORTNESS OF BREATH, Disp: 8.5 each, Rfl: 2   Albuterol Sulfate (PROAIR RESPICLICK) 108 (90 Base) MCG/ACT AEPB, Inhale 1-2 puffs into the lungs every 6 (six) weeks. prn, Disp: 1 each, Rfl: 1   aspirin EC 81 MG tablet, Take 81 mg by mouth daily. Swallow whole., Disp: , Rfl:    atorvastatin (LIPITOR) 20 MG tablet, TAKE 1 TABLET BY MOUTH EVERY DAY, Disp: 90 tablet, Rfl: 0   blood glucose meter kit and supplies KIT, Dispense based on patient and insurance preference. Use up to four times daily as directed., Disp: 1 each, Rfl: 0   blood glucose meter kit and supplies, Dispense based on patient and insurance preference. Use up to four times daily as directed. (FOR ICD-10 E10.9, E11.9)., Disp: 1 each, Rfl: 0   cephALEXin (KEFLEX) 500 MG capsule, Take 1 capsule (500 mg total) by mouth 2 (two) times daily., Disp: 14 capsule, Rfl: 0   cetirizine (ZYRTEC) 10 MG tablet, Take 1 tablet (10 mg total) by mouth daily., Disp: 30 tablet, Rfl: 11   cholecalciferol (VITAMIN D) 400 UNITS TABS  tablet, Take 800 Units by mouth daily., Disp: , Rfl:    citalopram (CELEXA) 20 MG tablet, TAKE 1 TABLET BY MOUTH EVERY DAY, Disp: 90 tablet, Rfl: 0   diclofenac sodium (VOLTAREN) 1 % GEL, Apply 4 g topically 4 (four) times daily. To left knee for pain, Disp: 200 g, Rfl: 1   fish oil-omega-3 fatty acids 1000 MG capsule, Take 1 g by mouth daily., Disp: , Rfl:    fluticasone (FLONASE) 50 MCG/ACT nasal spray, SPRAY 2 SPRAYS INTO EACH NOSTRIL EVERY DAY, Disp: 48 mL, Rfl: 1   furosemide (LASIX) 40 MG tablet, TAKE 1 TABLET BY MOUTH EVERY DAY, Disp: 90 tablet, Rfl: 0   gabapentin (NEURONTIN) 100 MG capsule, TAKE 1 CAPSULE (100 MG TOTAL) BY MOUTH THREE TIMES DAILY., Disp: 90 capsule, Rfl: 1   glimepiride (AMARYL) 1 MG tablet, Take 1 tablet (1 mg total) by mouth daily with breakfast. (NEEDS TO BE SEEN BEFORE NEXT REFILL), Disp: 30 tablet, Rfl: 0   glucose blood (ACCU-CHEK GUIDE) test strip, Test BS up to 4 times daily Dx E11.42,  Disp: 400 strip, Rfl: 3   indomethacin (INDOCIN) 50 MG capsule, TAKE 1 CAPSULE BY MOUTH THREE TIMES A DAY AS NEEDED, Disp: 60 capsule, Rfl: 2   oxyCODONE-acetaminophen (PERCOCET) 10-325 MG per tablet, Take 1 tablet by mouth every 12 (twelve) hours as needed. (Patient taking differently: Take 1 tablet by mouth every 12 (twelve) hours as needed for pain.), Disp: 90 tablet, Rfl: 0   predniSONE (STERAPRED UNI-PAK 21 TAB) 10 MG (21) TBPK tablet, Use as directed, Disp: 21 tablet, Rfl: 0   Specialty Vitamins Products (MAGNESIUM, AMINO ACID CHELATE,) 133 MG tablet, Take 1 tablet by mouth daily., Disp: , Rfl:    verapamil (CALAN-SR) 120 MG CR tablet, TAKE 1 TABLET BY MOUTH AT  BEDTIME, Disp: 90 tablet, Rfl: 1  Observations/Objective: Patient is well-developed, well-nourished in no acute distress.  Resting comfortably  at home.  Head is normocephalic, atraumatic.  No labored breathing. Speech is clear and coherent with logical content.  Patient is alert and oriented at baseline.     Assessment and Plan: 1. Urinary frequency - Urinalysis, Complete; Future - Urine Culture; Future  Urine culture pending  Force fluids Follow up if symptoms worsen or do not improve    Follow Up Instructions: I discussed the assessment and treatment plan with the patient. The patient was provided an opportunity to ask questions and all were answered. The patient agreed with the plan and demonstrated an understanding of the instructions.  A copy of instructions were sent to the patient via MyChart unless otherwise noted below.    The patient was advised to call back or seek an in-person evaluation if the symptoms worsen or if the condition fails to improve as anticipated.  Time:  I spent 11 minutes with the patient via telehealth technology discussing the above problems/concerns.    Jannifer Rodney, FNP

## 2022-12-08 NOTE — Telephone Encounter (Signed)
Called and spoke with patients wife states he is still going to the bathroom a lot and feels bad she wants to know if he can leave another urine to see if antibiotic can be called in Please advise.

## 2022-12-08 NOTE — Patient Instructions (Signed)
Urinary Tract Infection, Adult  A urinary tract infection (UTI) is an infection of any part of the urinary tract. The urinary tract includes the kidneys, ureters, bladder, and urethra. These organs make, store, and get rid of urine in the body. An upper UTI affects the ureters and kidneys. A lower UTI affects the bladder and urethra. What are the causes? Most urinary tract infections are caused by bacteria in your genital area around your urethra, where urine leaves your body. These bacteria grow and cause inflammation of your urinary tract. What increases the risk? You are more likely to develop this condition if: You have a urinary catheter that stays in place. You are not able to control when you urinate or have a bowel movement (incontinence). You are male and you: Use a spermicide or diaphragm for birth control. Have low estrogen levels. Are pregnant. You have certain genes that increase your risk. You are sexually active. You take antibiotic medicines. You have a condition that causes your flow of urine to slow down, such as: An enlarged prostate, if you are male. Blockage in your urethra. A kidney stone. A nerve condition that affects your bladder control (neurogenic bladder). Not getting enough to drink, or not urinating often. You have certain medical conditions, such as: Diabetes. A weak disease-fighting system (immunesystem). Sickle cell disease. Gout. Spinal cord injury. What are the signs or symptoms? Symptoms of this condition include: Needing to urinate right away (urgency). Frequent urination. This may include small amounts of urine each time you urinate. Pain or burning with urination. Blood in the urine. Urine that smells bad or unusual. Trouble urinating. Cloudy urine. Vaginal discharge, if you are male. Pain in the abdomen or the lower back. You may also have: Vomiting or a decreased appetite. Confusion. Irritability or tiredness. A fever or  chills. Diarrhea. The first symptom in older adults may be confusion. In some cases, they may not have any symptoms until the infection has worsened. How is this diagnosed? This condition is diagnosed based on your medical history and a physical exam. You may also have other tests, including: Urine tests. Blood tests. Tests for STIs (sexually transmitted infections). If you have had more than one UTI, a cystoscopy or imaging studies may be done to determine the cause of the infections. How is this treated? Treatment for this condition includes: Antibiotic medicine. Over-the-counter medicines to treat discomfort. Drinking enough water to stay hydrated. If you have frequent infections or have other conditions such as a kidney stone, you may need to see a health care provider who specializes in the urinary tract (urologist). In rare cases, urinary tract infections can cause sepsis. Sepsis is a life-threatening condition that occurs when the body responds to an infection. Sepsis is treated in the hospital with IV antibiotics, fluids, and other medicines. Follow these instructions at home:  Medicines Take over-the-counter and prescription medicines only as told by your health care provider. If you were prescribed an antibiotic medicine, take it as told by your health care provider. Do not stop using the antibiotic even if you start to feel better. General instructions Make sure you: Empty your bladder often and completely. Do not hold urine for long periods of time. Empty your bladder after sex. Wipe from front to back after urinating or having a bowel movement if you are male. Use each tissue only one time when you wipe. Drink enough fluid to keep your urine pale yellow. Keep all follow-up visits. This is important. Contact a health  care provider if: Your symptoms do not get better after 1-2 days. Your symptoms go away and then return. Get help right away if: You have severe pain in  your back or your lower abdomen. You have a fever or chills. You have nausea or vomiting. Summary A urinary tract infection (UTI) is an infection of any part of the urinary tract, which includes the kidneys, ureters, bladder, and urethra. Most urinary tract infections are caused by bacteria in your genital area. Treatment for this condition often includes antibiotic medicines. If you were prescribed an antibiotic medicine, take it as told by your health care provider. Do not stop using the antibiotic even if you start to feel better. Keep all follow-up visits. This is important. This information is not intended to replace advice given to you by your health care provider. Make sure you discuss any questions you have with your health care provider. Document Revised: 10/16/2019 Document Reviewed: 10/21/2019 Elsevier Patient Education  2024 [employer].

## 2022-12-09 DIAGNOSIS — Z79891 Long term (current) use of opiate analgesic: Secondary | ICD-10-CM | POA: Diagnosis not present

## 2022-12-09 DIAGNOSIS — Z79899 Other long term (current) drug therapy: Secondary | ICD-10-CM | POA: Diagnosis not present

## 2022-12-09 DIAGNOSIS — G894 Chronic pain syndrome: Secondary | ICD-10-CM | POA: Diagnosis not present

## 2022-12-09 DIAGNOSIS — Z5181 Encounter for therapeutic drug level monitoring: Secondary | ICD-10-CM | POA: Diagnosis not present

## 2022-12-09 DIAGNOSIS — M5136 Other intervertebral disc degeneration, lumbar region: Secondary | ICD-10-CM | POA: Diagnosis not present

## 2022-12-09 DIAGNOSIS — M5416 Radiculopathy, lumbar region: Secondary | ICD-10-CM | POA: Diagnosis not present

## 2022-12-11 ENCOUNTER — Other Ambulatory Visit: Payer: Medicare Other

## 2022-12-11 DIAGNOSIS — R350 Frequency of micturition: Secondary | ICD-10-CM | POA: Diagnosis not present

## 2022-12-11 LAB — URINALYSIS, COMPLETE
Bilirubin, UA: NEGATIVE
Glucose, UA: NEGATIVE
Ketones, UA: NEGATIVE
Leukocytes,UA: NEGATIVE
Nitrite, UA: NEGATIVE
Protein,UA: NEGATIVE
Specific Gravity, UA: 1.02 (ref 1.005–1.030)
Urobilinogen, Ur: 0.2 mg/dL (ref 0.2–1.0)
pH, UA: 6.5 (ref 5.0–7.5)

## 2022-12-11 LAB — MICROSCOPIC EXAMINATION
RBC, Urine: NONE SEEN /hpf (ref 0–2)
Renal Epithel, UA: NONE SEEN /hpf
Yeast, UA: NONE SEEN

## 2022-12-16 ENCOUNTER — Other Ambulatory Visit: Payer: Self-pay | Admitting: Family

## 2022-12-16 MED ORDER — CIPROFLOXACIN HCL 500 MG PO TABS: 500.0000 mg | ORAL_TABLET | Freq: Two times a day (BID) | ORAL | 0 refills | Status: DC

## 2022-12-18 LAB — URINE CULTURE

## 2022-12-28 DIAGNOSIS — U071 COVID-19: Secondary | ICD-10-CM | POA: Diagnosis not present

## 2022-12-28 DIAGNOSIS — J441 Chronic obstructive pulmonary disease with (acute) exacerbation: Secondary | ICD-10-CM | POA: Diagnosis not present

## 2023-01-02 ENCOUNTER — Other Ambulatory Visit: Payer: Self-pay | Admitting: Family

## 2023-01-02 DIAGNOSIS — E1142 Type 2 diabetes mellitus with diabetic polyneuropathy: Secondary | ICD-10-CM

## 2023-01-21 ENCOUNTER — Ambulatory Visit: Payer: Medicare Other

## 2023-01-21 VITALS — Ht 69.0 in | Wt 300.0 lb

## 2023-01-21 DIAGNOSIS — Z0000 Encounter for general adult medical examination without abnormal findings: Secondary | ICD-10-CM | POA: Diagnosis not present

## 2023-01-21 DIAGNOSIS — R509 Fever, unspecified: Secondary | ICD-10-CM | POA: Diagnosis not present

## 2023-01-21 DIAGNOSIS — R059 Cough, unspecified: Secondary | ICD-10-CM | POA: Diagnosis not present

## 2023-01-21 NOTE — Patient Instructions (Signed)
Marcus Carr , Thank you for taking time to come for your Medicare Wellness Visit. I appreciate your ongoing commitment to your health goals. Please review the following plan we discussed and let me know if I can assist you in the future.   Referrals/Orders/Follow-Ups/Clinician Recommendations: Aim for 30 minutes of exercise or brisk walking, 6-8 glasses of water, and 5 servings of fruits and vegetables each day.   This is a list of the screening recommended for you and due dates:  Health Maintenance  Topic Date Due   COVID-19 Vaccine (3 - Moderna risk series) 06/11/2019   Screening for Lung Cancer  11/20/2022   Zoster (Shingles) Vaccine (2 of 2) 01/01/2023   Flu Shot  06/22/2023*   Medicare Annual Wellness Visit  01/21/2024   DTaP/Tdap/Td vaccine (2 - Td or Tdap) 07/09/2027   Pneumonia Vaccine  Completed   HPV Vaccine  Aged Out   Hepatitis C Screening  Discontinued  *Topic was postponed. The date shown is not the original due date.    Advanced directives: (Provided) Advance directive discussed with you today. I have provided a copy for you to complete at home and have notarized. Once this is complete, please bring a copy in to our office so we can scan it into your chart. Information on Advanced Care Planning can be found at [facility] Advance Health Care Directives Advance Health Care Directives ([email or web address]    Next Medicare Annual Wellness Visit scheduled for next year: Yes  insert Preventive Care attachment Insert FALL PREVENTION attachment if needed

## 2023-01-21 NOTE — Progress Notes (Signed)
Subjective:   Marcus Carr is a 82 y.o. male who presents for Medicare Annual/Subsequent preventive examination.  Visit Complete: Virtual I connected with  Marcus Carr on 01/21/23 by a audio enabled telemedicine application and verified that I am speaking with the correct person using two identifiers.  Patient Location: Home  Provider Location: Home Office  I discussed the limitations of evaluation and management by telemedicine. The patient expressed understanding and agreed to proceed.  Vital Signs: Because this visit was a virtual/telehealth visit, some criteria may be missing or patient reported. Any vitals not documented were not able to be obtained and vitals that have been documented are patient reported.  Patient Medicare AWV questionnaire was completed by the patient on 01/21/2023; I have confirmed that all information answered by patient is correct and no changes since this date.  Cardiac Risk Factors include: advanced age (>52men, >71 women);diabetes mellitus;dyslipidemia;male gender;hypertension;sedentary lifestyle     Objective:    Today's Vitals   01/21/23 1343  Weight: 300 lb (136.1 kg)  Height: 5\' 9"  (1.753 m)   Body mass index is 44.3 kg/m.     01/21/2023    1:48 PM 01/16/2022   10:30 AM 11/19/2021    5:20 PM 11/15/2020    1:21 PM 11/15/2019   11:51 AM 11/08/2018    1:37 PM 07/15/2017   11:17 AM  Advanced Directives  Does Patient Have a Medical Advance Directive? No No No No No No No  Would patient like information on creating a medical advance directive? Yes (MAU/Ambulatory/Procedural Areas - Information given) No - Patient declined No - Patient declined No - Patient declined No - Patient declined Yes (MAU/Ambulatory/Procedural Areas - Information given) Yes (MAU/Ambulatory/Procedural Areas - Information given)    Current Medications (verified) Outpatient Encounter Medications as of 01/21/2023  Medication Sig   Accu-Chek Softclix Lancets  lancets USE UP TO 4 TIMES DAILY AS DIRECTED (E10.9, E11.9)   albuterol (VENTOLIN HFA) 108 (90 Base) MCG/ACT inhaler INHALE 2 PUFFS INTO THE LUNGS EVERY 6 HOURS AS NEEDED FOR WHEEZE OR SHORTNESS OF BREATH   Albuterol Sulfate (PROAIR RESPICLICK) 108 (90 Base) MCG/ACT AEPB Inhale 1-2 puffs into the lungs every 6 (six) weeks. prn   aspirin EC 81 MG tablet Take 81 mg by mouth daily. Swallow whole.   atorvastatin (LIPITOR) 20 MG tablet TAKE 1 TABLET BY MOUTH EVERY DAY   blood glucose meter kit and supplies KIT Dispense based on patient and insurance preference. Use up to four times daily as directed.   blood glucose meter kit and supplies Dispense based on patient and insurance preference. Use up to four times daily as directed. (FOR ICD-10 E10.9, E11.9).   cephALEXin (KEFLEX) 500 MG capsule Take 1 capsule (500 mg total) by mouth 2 (two) times daily.   cetirizine (ZYRTEC) 10 MG tablet Take 1 tablet (10 mg total) by mouth daily.   cholecalciferol (VITAMIN D) 400 UNITS TABS tablet Take 800 Units by mouth daily.   ciprofloxacin (CIPRO) 500 MG tablet Take 1 tablet (500 mg total) by mouth 2 (two) times daily.   citalopram (CELEXA) 20 MG tablet TAKE 1 TABLET BY MOUTH EVERY DAY   diclofenac sodium (VOLTAREN) 1 % GEL Apply 4 g topically 4 (four) times daily. To left knee for pain   fish oil-omega-3 fatty acids 1000 MG capsule Take 1 g by mouth daily.   fluticasone (FLONASE) 50 MCG/ACT nasal spray SPRAY 2 SPRAYS INTO EACH NOSTRIL EVERY DAY   furosemide (LASIX) 40 MG  tablet TAKE 1 TABLET BY MOUTH EVERY DAY   gabapentin (NEURONTIN) 100 MG capsule TAKE 1 CAPSULE (100 MG TOTAL) BY MOUTH THREE TIMES DAILY.   glimepiride (AMARYL) 1 MG tablet Take 1 tablet (1 mg total) by mouth daily with breakfast. Dx Marchia Bond E88.810   glucose blood (ACCU-CHEK GUIDE) test strip Test BS up to 4 times daily Dx E11.42   indomethacin (INDOCIN) 50 MG capsule TAKE 1 CAPSULE BY MOUTH THREE TIMES A DAY AS NEEDED   oxyCODONE-acetaminophen  (PERCOCET) 10-325 MG per tablet Take 1 tablet by mouth every 12 (twelve) hours as needed. (Patient taking differently: Take 1 tablet by mouth every 12 (twelve) hours as needed for pain.)   predniSONE (STERAPRED UNI-PAK 21 TAB) 10 MG (21) TBPK tablet Use as directed   Specialty Vitamins Products (MAGNESIUM, AMINO ACID CHELATE,) 133 MG tablet Take 1 tablet by mouth daily.   verapamil (CALAN-SR) 120 MG CR tablet TAKE 1 TABLET BY MOUTH AT  BEDTIME   No facility-administered encounter medications on file as of 01/21/2023.    Allergies (verified) Penicillins   History: Past Medical History:  Diagnosis Date   Abscess, jaw    Anxiety    Back pain    chronic back pain treated by Dr. Ethelene Hal   Chronic kidney disease    Chronic mastoiditis of both sides 11/07/2014   Cigarette smoker 11/07/2014   COPD (chronic obstructive pulmonary disease) (HCC)    Gout    Hyperlipidemia    Hypertension    Morbid obesity (HCC) 11/07/2014   Osteomyelitis of mandible 11/07/2014   Thyroid nodule 11/07/2014   Past Surgical History:  Procedure Laterality Date   APPENDECTOMY     TONSILLECTOMY     Family History  Problem Relation Age of Onset   Cancer Mother        esophageal   Heart attack Father 43   Social History   Socioeconomic History   Marital status: Married    Spouse name: Rosa   Number of children: 2   Years of education: 8   Highest education level: 8th grade  Occupational History   Occupation: Retired  Tobacco Use   Smoking status: Former    Current packs/day: 0.00    Average packs/day: 1.5 packs/day for 50.0 years (75.0 ttl pk-yrs)    Types: Cigarettes    Start date: 10/23/1964    Quit date: 10/24/2014    Years since quitting: 8.2   Smokeless tobacco: Never  Vaping Use   Vaping status: Never Used  Substance and Sexual Activity   Alcohol use: No    Alcohol/week: 0.0 standard drinks of alcohol   Drug use: No   Sexual activity: Not Currently  Other Topics Concern   Not on file   Social History Narrative   Lives wife and grandson stays with them a lot.  Two daughters - both live within 10 miles   Social Determinants of Health   Financial Resource Strain: Low Risk  (01/21/2023)   Overall Financial Resource Strain (CARDIA)    Difficulty of Paying Living Expenses: Not hard at all  Food Insecurity: No Food Insecurity (01/21/2023)   Hunger Vital Sign    Worried About Running Out of Food in the Last Year: Never true    Ran Out of Food in the Last Year: Never true  Transportation Needs: No Transportation Needs (01/21/2023)   PRAPARE - Administrator, Civil Service (Medical): No    Lack of Transportation (Non-Medical): No  Physical Activity: Inactive (  01/21/2023)   Exercise Vital Sign    Days of Exercise per Week: 0 days    Minutes of Exercise per Session: 0 min  Stress: No Stress Concern Present (01/21/2023)   [employer] of Occupational Health - Occupational Stress Questionnaire    Feeling of Stress : Not at all  Social Connections: Moderately Isolated (01/21/2023)   Social Connection and Isolation Panel [NHANES]    Frequency of Communication with Friends and Family: More than three times a week    Frequency of Social Gatherings with Friends and Family: More than three times a week    Attends Religious Services: Never    Database administrator or Organizations: No    Attends Engineer, structural: Never    Marital Status: Married    Tobacco Counseling Counseling given: Not Answered   Clinical Intake:  Pre-visit preparation completed: Yes  Pain : No/denies pain     Nutritional Risks: None Diabetes: Yes CBG done?: No Did pt. bring in CBG monitor from home?: No  How often do you need to have someone help you when you read instructions, pamphlets, or other written materials from your doctor or pharmacy?: 1 - Never  Interpreter Needed?: No  Information entered by :: Renie Ora, LPN   Activities of Daily Living     01/21/2023    1:48 PM  In your present state of health, do you have any difficulty performing the following activities:  Hearing? 0  Vision? 0  Difficulty concentrating or making decisions? 0  Walking or climbing stairs? 0  Dressing or bathing? 0  Doing errands, shopping? 0  Preparing Food and eating ? N  Using the Toilet? N  In the past six months, have you accidently leaked urine? N  Do you have problems with loss of bowel control? N  Managing your Medications? N  Managing your Finances? N  Housekeeping or managing your Housekeeping? N    Patient Care Team: Junie Spencer, FNP as PCP - General (Nurse Practitioner) Ihor Gully, Hurley Cisco (Inactive) as Surgeon Kenn File, MD as Attending Physician (Infectious Diseases)  Indicate any recent Medical Services you may have received from other than [facility] providers in the past year (date may be approximate).     Assessment:   This is a routine wellness examination for Marcus Carr.  Hearing/Vision screen Vision Screening - Comments:: Declined due to health    Goals Addressed             This Visit's Progress    DIET - INCREASE WATER INTAKE         Depression Screen    01/21/2023    1:47 PM 01/17/2022   11:53 AM 01/16/2022   10:29 AM 12/03/2021    4:07 PM 08/12/2021    3:46 PM 07/11/2021    4:24 PM 06/24/2021   11:01 AM  PHQ 2/9 Scores  PHQ - 2 Score 0 0 0 1 3 0 0  PHQ- 9 Score    6 8 6      Fall Risk    01/21/2023    1:45 PM 05/23/2022   12:13 PM 01/17/2022   11:56 AM 01/16/2022   10:35 AM 12/03/2021    4:08 PM  Fall Risk   Falls in the past year? 0 1 1 1 1   Number falls in past yr: 0 0 1 1 1   Injury with Fall? 0 1 0 0 0  Risk for fall due to : No Fall Risks History of fall(s)  History of fall(s) History of fall(s);Impaired balance/gait;Impaired mobility   Follow up Falls prevention discussed Falls evaluation completed;Education provided Falls evaluation completed Falls evaluation completed     MEDICARE RISK  AT HOME: Medicare Risk at Home Any stairs in or around the home?: No If so, are there any without handrails?: No Home free of loose throw rugs in walkways, pet beds, electrical cords, etc?: Yes Adequate lighting in your home to reduce risk of falls?: Yes Life alert?: No Use of a cane, walker or w/c?: No Grab bars in the bathroom?: Yes Shower chair or bench in shower?: Yes Elevated toilet seat or a handicapped toilet?: Yes  TIMED UP AND GO:  Was the test performed?  No    Cognitive Function:    07/08/2017   12:09 PM  MMSE - Mini Mental State Exam  Orientation to time 5  Orientation to Place 5  Registration 3  Attention/ Calculation 0  Recall 2  Language- name 2 objects 2  Language- repeat 1  Language- follow 3 step command 3  Language- read & follow direction 1  Write a sentence 1  Copy design 1  Total score 24        01/21/2023    1:48 PM 01/16/2022   10:31 AM 11/15/2020    1:18 PM 11/15/2019   12:03 PM 11/15/2019   11:55 AM  6CIT Screen  What Year? 0 points 0 points 0 points  0 points  What month? 0 points 0 points 0 points  0 points  What time? 0 points 0 points 0 points  0 points  Count back from 20 0 points 0 points 0 points 4 points 0 points  Months in reverse 0 points 0 points 2 points  0 points  Repeat phrase 0 points 2 points 4 points  0 points  Total Score 0 points 2 points 6 points  0 points    Immunizations Immunization History  Administered Date(s) Administered   Fluad Quad(high Dose 65+) 01/15/2021, 01/09/2022   Influenza Split 01/05/2013   Influenza, High Dose Seasonal PF 01/21/2014, 01/01/2015, 01/14/2016, 01/13/2017, 01/16/2018, 12/19/2018, 01/02/2020   Influenza,inj,quad, With Preservative 12/22/2016, 01/13/2018   Influenza-Unspecified 01/21/2014, 01/01/2015   Moderna Sars-Covid-2 Vaccination 04/16/2019, 05/14/2019   Pneumococcal Conjugate-13 10/24/2015   Pneumococcal Polysaccharide-23 07/08/2017   Tdap 07/08/2017   Zoster  Recombinant(Shingrix) 11/06/2022    TDAP status: Up to date  Flu Vaccine status: Due, Education has been provided regarding the importance of this vaccine. Advised may receive this vaccine at local pharmacy or Health Dept. Aware to provide a copy of the vaccination record if obtained from local pharmacy or Health Dept. Verbalized acceptance and understanding.  Pneumococcal vaccine status: Up to date  Covid-19 vaccine status: Completed vaccines  Qualifies for Shingles Vaccine? Yes   Zostavax completed Yes   Shingrix Completed?: Yes  Screening Tests Health Maintenance  Topic Date Due   COVID-19 Vaccine (3 - Moderna risk series) 06/11/2019   Lung Cancer Screening  11/20/2022   Zoster Vaccines- Shingrix (2 of 2) 01/01/2023   INFLUENZA VACCINE  06/22/2023 (Originally 10/23/2022)   Medicare Annual Wellness (AWV)  01/21/2024   DTaP/Tdap/Td (2 - Td or Tdap) 07/09/2027   Pneumonia Vaccine 59+ Years old  Completed   HPV VACCINES  Aged Out   Hepatitis C Screening  Discontinued    Health Maintenance  Health Maintenance Due  Topic Date Due   COVID-19 Vaccine (3 - Moderna risk series) 06/11/2019   Lung Cancer Screening  11/20/2022  Zoster Vaccines- Shingrix (2 of 2) 01/01/2023    Colorectal cancer screening: No longer required.   Lung Cancer Screening: (Low Dose CT Chest recommended if Age 26-80 years, 20 pack-year currently smoking OR have quit w/in 15years.) does qualify.   Lung Cancer Screening Referral: declined   Additional Screening:  Hepatitis C Screening: does not qualify; Completed 11/21/2019  Vision Screening: Recommended annual ophthalmology exams for early detection of glaucoma and other disorders of the eye. Is the patient up to date with their annual eye exam?  No  Who is the provider or what is the name of the office in which the patient attends annual eye exams? Declined due to health  If pt is not established with a provider, would they like to be referred to a  provider to establish care? No .   Dental Screening: Recommended annual dental exams for proper oral hygiene  Diabetic Foot Exam: Diabetic Foot Exam: Overdue, Pt has been advised about the importance in completing this exam. Pt is scheduled for diabetic foot exam on next office visit .  Community Resource Referral / Chronic Care Management: CRR required this visit?  No   CCM required this visit?  No     Plan:     I have personally reviewed and noted the following in the patient's chart:   Medical and social history Use of alcohol, tobacco or illicit drugs  Current medications and supplements including opioid prescriptions. Patient is not currently taking opioid prescriptions. Functional ability and status Nutritional status Physical activity Advanced directives List of other physicians Hospitalizations, surgeries, and ER visits in previous 12 months Vitals Screenings to include cognitive, depression, and falls Referrals and appointments  In addition, I have reviewed and discussed with patient certain preventive protocols, quality metrics, and best practice recommendations. A written personalized care plan for preventive services as well as general preventive health recommendations were provided to patient.     Lorrene Reid, LPN   16/12/9602   After Visit Summary: (MyChart) Due to this being a telephonic visit, the after visit summary with patients personalized plan was offered to patient via MyChart   Nurse Notes: none

## 2023-01-22 DIAGNOSIS — G253 Myoclonus: Secondary | ICD-10-CM | POA: Diagnosis not present

## 2023-01-22 DIAGNOSIS — M79605 Pain in left leg: Secondary | ICD-10-CM | POA: Diagnosis not present

## 2023-01-22 DIAGNOSIS — D696 Thrombocytopenia, unspecified: Secondary | ICD-10-CM | POA: Diagnosis not present

## 2023-01-22 DIAGNOSIS — E785 Hyperlipidemia, unspecified: Secondary | ICD-10-CM | POA: Diagnosis not present

## 2023-01-22 DIAGNOSIS — R0602 Shortness of breath: Secondary | ICD-10-CM | POA: Diagnosis not present

## 2023-01-22 DIAGNOSIS — Z9049 Acquired absence of other specified parts of digestive tract: Secondary | ICD-10-CM | POA: Diagnosis not present

## 2023-01-22 DIAGNOSIS — G8929 Other chronic pain: Secondary | ICD-10-CM | POA: Diagnosis not present

## 2023-01-22 DIAGNOSIS — G894 Chronic pain syndrome: Secondary | ICD-10-CM | POA: Diagnosis not present

## 2023-01-22 DIAGNOSIS — N1831 Chronic kidney disease, stage 3a: Secondary | ICD-10-CM | POA: Diagnosis not present

## 2023-01-22 DIAGNOSIS — M79604 Pain in right leg: Secondary | ICD-10-CM | POA: Diagnosis not present

## 2023-01-22 DIAGNOSIS — M1A9XX0 Chronic gout, unspecified, without tophus (tophi): Secondary | ICD-10-CM | POA: Diagnosis not present

## 2023-01-22 DIAGNOSIS — U071 COVID-19: Secondary | ICD-10-CM | POA: Diagnosis not present

## 2023-01-22 DIAGNOSIS — M7989 Other specified soft tissue disorders: Secondary | ICD-10-CM | POA: Diagnosis not present

## 2023-01-22 DIAGNOSIS — J9602 Acute respiratory failure with hypercapnia: Secondary | ICD-10-CM | POA: Diagnosis not present

## 2023-01-22 DIAGNOSIS — J1282 Pneumonia due to coronavirus disease 2019: Secondary | ICD-10-CM | POA: Diagnosis not present

## 2023-01-22 DIAGNOSIS — J9621 Acute and chronic respiratory failure with hypoxia: Secondary | ICD-10-CM | POA: Diagnosis not present

## 2023-01-22 DIAGNOSIS — M272 Inflammatory conditions of jaws: Secondary | ICD-10-CM | POA: Diagnosis not present

## 2023-01-22 DIAGNOSIS — I130 Hypertensive heart and chronic kidney disease with heart failure and stage 1 through stage 4 chronic kidney disease, or unspecified chronic kidney disease: Secondary | ICD-10-CM | POA: Diagnosis not present

## 2023-01-22 DIAGNOSIS — E1122 Type 2 diabetes mellitus with diabetic chronic kidney disease: Secondary | ICD-10-CM | POA: Diagnosis not present

## 2023-01-22 DIAGNOSIS — G9340 Encephalopathy, unspecified: Secondary | ICD-10-CM | POA: Diagnosis not present

## 2023-01-22 DIAGNOSIS — I5033 Acute on chronic diastolic (congestive) heart failure: Secondary | ICD-10-CM | POA: Diagnosis not present

## 2023-01-22 DIAGNOSIS — R9401 Abnormal electroencephalogram [EEG]: Secondary | ICD-10-CM | POA: Diagnosis not present

## 2023-01-22 DIAGNOSIS — R5383 Other fatigue: Secondary | ICD-10-CM | POA: Diagnosis not present

## 2023-01-22 DIAGNOSIS — I517 Cardiomegaly: Secondary | ICD-10-CM | POA: Diagnosis not present

## 2023-01-22 DIAGNOSIS — E1165 Type 2 diabetes mellitus with hyperglycemia: Secondary | ICD-10-CM | POA: Diagnosis not present

## 2023-01-22 DIAGNOSIS — Z9981 Dependence on supplemental oxygen: Secondary | ICD-10-CM | POA: Diagnosis not present

## 2023-01-22 DIAGNOSIS — R918 Other nonspecific abnormal finding of lung field: Secondary | ICD-10-CM | POA: Diagnosis not present

## 2023-01-22 DIAGNOSIS — J441 Chronic obstructive pulmonary disease with (acute) exacerbation: Secondary | ICD-10-CM | POA: Diagnosis not present

## 2023-01-22 DIAGNOSIS — R509 Fever, unspecified: Secondary | ICD-10-CM | POA: Diagnosis not present

## 2023-01-22 DIAGNOSIS — J9601 Acute respiratory failure with hypoxia: Secondary | ICD-10-CM | POA: Diagnosis not present

## 2023-01-22 DIAGNOSIS — Z23 Encounter for immunization: Secondary | ICD-10-CM | POA: Diagnosis not present

## 2023-01-22 DIAGNOSIS — D631 Anemia in chronic kidney disease: Secondary | ICD-10-CM | POA: Diagnosis not present

## 2023-01-22 DIAGNOSIS — R600 Localized edema: Secondary | ICD-10-CM | POA: Diagnosis not present

## 2023-01-22 DIAGNOSIS — R259 Unspecified abnormal involuntary movements: Secondary | ICD-10-CM | POA: Diagnosis not present

## 2023-01-22 DIAGNOSIS — I3481 Nonrheumatic mitral (valve) annulus calcification: Secondary | ICD-10-CM | POA: Diagnosis not present

## 2023-01-22 DIAGNOSIS — R531 Weakness: Secondary | ICD-10-CM | POA: Diagnosis not present

## 2023-01-28 DIAGNOSIS — U071 COVID-19: Secondary | ICD-10-CM | POA: Diagnosis not present

## 2023-01-28 DIAGNOSIS — J441 Chronic obstructive pulmonary disease with (acute) exacerbation: Secondary | ICD-10-CM | POA: Diagnosis not present

## 2023-02-02 ENCOUNTER — Ambulatory Visit: Payer: Medicare Other | Admitting: Family

## 2023-02-02 ENCOUNTER — Encounter: Payer: Self-pay | Admitting: Family

## 2023-02-02 ENCOUNTER — Ambulatory Visit: Payer: Medicare Other

## 2023-02-02 VITALS — BP 119/69 | HR 55 | Temp 97.2°F | Ht 69.0 in

## 2023-02-02 DIAGNOSIS — J441 Chronic obstructive pulmonary disease with (acute) exacerbation: Secondary | ICD-10-CM

## 2023-02-02 DIAGNOSIS — R7303 Prediabetes: Secondary | ICD-10-CM

## 2023-02-02 DIAGNOSIS — J9611 Chronic respiratory failure with hypoxia: Secondary | ICD-10-CM | POA: Diagnosis not present

## 2023-02-02 DIAGNOSIS — E785 Hyperlipidemia, unspecified: Secondary | ICD-10-CM | POA: Diagnosis not present

## 2023-02-02 DIAGNOSIS — Z8616 Personal history of COVID-19: Secondary | ICD-10-CM

## 2023-02-02 DIAGNOSIS — I5032 Chronic diastolic (congestive) heart failure: Secondary | ICD-10-CM

## 2023-02-02 DIAGNOSIS — J1282 Pneumonia due to coronavirus disease 2019: Secondary | ICD-10-CM | POA: Diagnosis not present

## 2023-02-02 DIAGNOSIS — Z9981 Dependence on supplemental oxygen: Secondary | ICD-10-CM | POA: Diagnosis not present

## 2023-02-02 DIAGNOSIS — N1832 Chronic kidney disease, stage 3b: Secondary | ICD-10-CM | POA: Diagnosis not present

## 2023-02-02 DIAGNOSIS — I10 Essential (primary) hypertension: Secondary | ICD-10-CM

## 2023-02-02 DIAGNOSIS — Z09 Encounter for follow-up examination after completed treatment for conditions other than malignant neoplasm: Secondary | ICD-10-CM

## 2023-02-02 DIAGNOSIS — F419 Anxiety disorder, unspecified: Secondary | ICD-10-CM

## 2023-02-02 DIAGNOSIS — N179 Acute kidney failure, unspecified: Secondary | ICD-10-CM

## 2023-02-02 LAB — BAYER DCA HB A1C WAIVED: HB A1C (BAYER DCA - WAIVED): 6.7 % — ABNORMAL HIGH (ref 4.8–5.6)

## 2023-02-02 MED ORDER — ACCU-CHEK SOFTCLIX LANCETS MISC: 9 refills | Status: DC

## 2023-02-02 NOTE — Progress Notes (Signed)
Subjective:    Patient ID: Marcus Carr, male    DOB: 30-Apr-1940, 82 y.o.   MRN: [id number]  Chief Complaint  Patient presents with   Hospitalization Follow-up    PT presents to the office for hospital follow up and chronic follow up. He went to the hospital on 01/22/23 with SOB, cough and diagnosed with COVID pneumonia. He was discharged on 01/26/23 with mucinex and dexamethasone. He completed these and doing better.   PT has history of osteomyelitis of mandible, doing stable at this time. Pt is followed by Pain Management every 3 months for chronic back pain.    Pt has had elevated glucose and diagnosed as prediabetes.    He has CKD and tries to limit NSAID's. He is morbid obese with a BMI of 44.   He is followed by Cardiologists for syncope and CHF.    He has COPD and chronic respiratory failure and uses 2 L of O2. He quit smoking 2016. Hypertension This is a chronic problem. The current episode started more than 1 year ago. The problem has been resolved since onset. The problem is controlled. Associated symptoms include anxiety, malaise/fatigue, peripheral edema and shortness of breath. Risk factors for coronary artery disease include dyslipidemia, obesity and sedentary lifestyle. Past treatments include diuretics. The current treatment provides moderate improvement. Hypertensive end-organ damage includes CAD/MI and heart failure.  Congestive Heart Failure Presents for follow-up visit. Associated symptoms include edema, fatigue and shortness of breath. The symptoms have been stable.  Arthritis Presents for follow-up visit. He complains of pain and stiffness. Affected locations include the left knee, right knee, left MCP and right MCP. His pain is at a severity of 5/10. Associated symptoms include fatigue.  Hyperlipidemia This is a chronic problem. The current episode started more than 1 year ago. Exacerbating diseases include obesity. Associated symptoms include shortness of  breath. The current treatment provides mild improvement of lipids. Risk factors for coronary artery disease include dyslipidemia, hypertension, male sex and a sedentary lifestyle.  Diabetes He presents for his follow-up diabetic visit. Diabetes type: prediabetes. Hypoglycemia symptoms include nervousness/anxiousness. Associated symptoms include fatigue. Risk factors for coronary artery disease include dyslipidemia, hypertension, sedentary lifestyle and male sex.  Anxiety Presents for follow-up visit. Symptoms include excessive worry, nervous/anxious behavior, restlessness and shortness of breath. Symptoms occur occasionally. The severity of symptoms is mild.        Review of Systems  Constitutional:  Positive for fatigue and malaise/fatigue.  Respiratory:  Positive for shortness of breath.   Musculoskeletal:  Positive for arthritis and stiffness.  Psychiatric/Behavioral:  The patient is nervous/anxious.   All other systems reviewed and are negative.      Objective:   Physical Exam Vitals reviewed.  Constitutional:      General: He is not in acute distress.    Appearance: He is well-developed. He is obese.  HENT:     Head: Normocephalic.     Right Ear: Tympanic membrane normal.     Left Ear: Tympanic membrane normal.  Eyes:     General:        Right eye: No discharge.        Left eye: No discharge.     Pupils: Pupils are equal, round, and reactive to light.  Neck:     Thyroid: No thyromegaly.  Cardiovascular:     Rate and Rhythm: Normal rate and regular rhythm.     Heart sounds: Normal heart sounds. No murmur heard. Pulmonary:  Effort: Pulmonary effort is normal. No respiratory distress.     Breath sounds: Normal breath sounds. Decreased air movement present. No wheezing.  Abdominal:     General: Bowel sounds are normal. There is no distension.     Palpations: Abdomen is soft.     Tenderness: There is no abdominal tenderness.  Musculoskeletal:        General: No  tenderness. Normal range of motion.     Cervical back: Normal range of motion and neck supple.  Skin:    General: Skin is warm and dry.     Findings: No erythema or rash.  Neurological:     Mental Status: He is alert and oriented to person, place, and time.     Cranial Nerves: No cranial nerve deficit.     Deep Tendon Reflexes: Reflexes are normal and symmetric.  Psychiatric:        Behavior: Behavior normal.        Thought Content: Thought content normal.        Judgment: Judgment normal.       BP 119/69   Pulse (!) 55   Temp (!) 97.2 F (36.2 C) (Temporal)   Ht 5\' 9"  (1.753 m)   SpO2 96%   BMI 44.30 kg/m      Assessment & Plan:  Marcus Carr comes in today with chief complaint of Hospitalization Follow-up   Diagnosis and orders addressed:  1. Prediabetes - Accu-Chek Softclix Lancets lancets; USE UP TO 4 TIMES DAILY AS DIRECTED (E10.9, E11.9)  Dispense: 100 each; Refill: 9 - Bayer DCA Hb A1c Waived - CMP14+EGFR - CBC with Differential/Platelet  2. History of COVID-19 - CMP14+EGFR - CBC with Differential/Platelet - DG Chest 2 View; Future  3. Hospital discharge follow-up - CMP14+EGFR - CBC with Differential/Platelet - DG Chest 2 View; Future  4. Primary hypertension - CMP14+EGFR - CBC with Differential/Platelet  5. Stage 3b chronic kidney disease (HCC) - CMP14+EGFR - CBC with Differential/Platelet  6. Hyperlipidemia, unspecified hyperlipidemia type  - CMP14+EGFR - CBC with Differential/Platelet - Ambulatory referral to Cardiology  7. Morbid obesity (HCC) - CMP14+EGFR - CBC with Differential/Platelet - Ambulatory referral to Cardiology  8. Anxiety - CMP14+EGFR - CBC with Differential/Platelet  9. COPD with acute exacerbation (HCC) - CMP14+EGFR - CBC with Differential/Platelet  10. Acute renal failure superimposed on stage 3b chronic kidney disease, unspecified acute renal failure type (HCC) - CMP14+EGFR - CBC with  Differential/Platelet  11. Chronic diastolic CHF (congestive heart failure) (HCC) - CMP14+EGFR - CBC with Differential/Platelet - Ambulatory referral to Cardiology  12. Chronic hypoxic respiratory failure, on home oxygen therapy (HCC) - CMP14+EGFR - CBC with Differential/Platelet  13. Pneumonia due to COVID-19 virus  - DG Chest 2 View; Future   Labs pending Continue current medications  PT will come and get chest x-ray in 2 weeks to make sure pneumonia resolved  Health Maintenance reviewed Diet and exercise encouraged  Follow up plan: 3 months    Jannifer Rodney, FNP

## 2023-02-02 NOTE — Patient Instructions (Signed)
Heart Failure, Diagnosis  Heart failure is a condition in which the heart has trouble pumping blood. This may mean that the heart cannot pump enough blood out to the body or that the heart does not fill up with enough blood. For some people with heart failure, fluid may back up into the lungs. There may also be swelling (edema) in the lower legs. Heart failure is usually a long-term (chronic) condition. It is important for you to take good care of yourself and follow the treatment plan from your health care provider. Different stages of heart failure have different treatment plans. The stages are: Stage A: At risk for heart failure. Having no symptoms of heart failure, but being at risk for developing heart failure. Stage B: Pre-heart failure. Having no symptoms of heart failure, but having structural changes to the heart that indicate heart failure. Stage C: Symptomatic heart failure. Having symptoms of heart failure in addition to structural changes to the heart that indicate heart failure. Stage D: Advanced heart failure. Having symptoms that interfere with daily life and frequent hospitalizations related to heart failure. What are the causes? This condition may be caused by: High blood pressure (hypertension). Hypertension causes the heart muscle to work harder than normal. Coronary artery disease, or CAD. CAD is the buildup of cholesterol and fat (plaque) in the arteries of the heart. Heart attack, also called myocardial infarction. This injures the heart muscle, making it hard for the heart to pump blood. Abnormal heart valves. The valves do not open and close properly, forcing the heart to pump harder to keep the blood flowing. Heart muscle disease, inflammation, or infection (cardiomyopathy or myocarditis). This is damage to the heart muscle. It can increase the risk of heart failure. Lung disease. The heart works harder when the lungs are not healthy. What increases the risk? The risk  of heart failure increases as a person ages. This condition is also more likely to develop in people who: Are obese. Use tobacco or nicotine products. Abuse alcohol or drugs. Have taken medicines that can damage the heart, such as chemotherapy drugs. Have any of these conditions: Diabetes. Abnormal heart rhythms. Thyroid problems. Low blood counts (anemia). Chronic kidney disease. Have a family history of heart failure. What are the signs or symptoms? Symptoms of this condition include: Shortness of breath with activity, such as when climbing stairs. A cough that does not go away. Swelling of the feet, ankles, legs, or abdomen. Losing or gaining weight for no reason. Trouble breathing when lying flat. Waking from sleep because of the need to sit up and get more air. Rapid heartbeat. Other symptoms may include: Tiredness (fatigue) and loss of energy. Feeling light-headed, dizzy, or close to fainting. Nausea or loss of appetite. Waking up more often during the night to urinate (nocturia). Confusion. How is this diagnosed? This condition is diagnosed based on: Your medical history, symptoms, and a physical exam. Blood tests. Diagnostic tests, which may include: Echocardiogram. Electrocardiogram (ECG). Chest X-ray. Exercise stress test. Cardiac MRI. Cardiac catheterization and angiogram. Radionuclide scans. How is this treated? Treatment for this condition is aimed at managing the symptoms of heart failure. Medicines Treatment may include medicines that: Help lower blood pressure by relaxing (dilating) the blood vessels. These medicines are called ACE inhibitors (angiotensin-converting enzyme), ARBs (angiotensin receptor blockers), or vasodilators. Cause the kidneys to remove salt and water from the blood through urination (diuretics). Improve heart muscle strength and prevent the heart from beating too fast (beta blockers). Increase the   force of the heartbeat  (digoxin). Lower heart rates. Certain diabetes medicines (SGLT-2 inhibitors) may also be used in treatment. Healthy behavior changes Treatment may also include making healthy lifestyle changes, such as: Reaching and staying at a healthy weight. Not using tobacco or nicotine products. Eating heart-healthy foods. Limiting or avoiding alcohol. Stopping the use of illegal drugs. Being physically active. Participating in a cardiac rehabilitation program, which is a treatment program to improve your health and well-being through exercise training, education, and counseling. Other treatments Other treatments may include: Procedures to open blocked arteries or repair damaged valves. Placing a pacemaker to improve heart function (cardiac resynchronization therapy). Placing a device to treat serious abnormal heart rhythms (implantable cardioverter defibrillator, or ICD). Placing a device to improve the pumping ability of the heart (left ventricular assist device, or LVAD). Receiving a healthy heart from a donor (heart transplant). This is done when other treatments have not helped. Follow these instructions at home: Manage other health conditions as told by your health care provider. These may include hypertension, diabetes, thyroid disease, or abnormal heart rhythms. Get ongoing education and support as needed. Learn as much as you can about heart failure. Keep all follow-up visits. This is important. Where to find more information American Heart Association: www.heart.org Centers for Disease Control and Prevention: www.cdc.gov NIH National Institute on Aging: www.nia.nih.gov Summary Heart failure is a condition in which the heart has trouble pumping blood. This condition is commonly caused by high blood pressure and other diseases of the heart and lungs. Symptoms of this condition include shortness of breath, tiredness (fatigue), nausea, and swelling of the feet, ankles, legs, or  abdomen. Treatments for this condition may include medicines, lifestyle changes, and surgery. Manage other health conditions as told by your health care provider. This information is not intended to replace advice given to you by your health care provider. Make sure you discuss any questions you have with your health care provider. Document Revised: 06/18/2021 Document Reviewed: 10/01/2019 Elsevier Patient Education  2024 Elsevier Inc.  

## 2023-02-03 ENCOUNTER — Telehealth: Payer: Self-pay | Admitting: Family Medicine

## 2023-02-03 DIAGNOSIS — J449 Chronic obstructive pulmonary disease, unspecified: Secondary | ICD-10-CM | POA: Diagnosis not present

## 2023-02-03 LAB — CMP14+EGFR
ALT: 17 [IU]/L (ref 0–44)
AST: 17 [IU]/L (ref 0–40)
Albumin: 4 g/dL (ref 3.8–4.8)
Alkaline Phosphatase: 97 [IU]/L (ref 44–121)
BUN/Creatinine Ratio: 27 — ABNORMAL HIGH (ref 10–24)
BUN: 35 mg/dL — ABNORMAL HIGH (ref 8–27)
Bilirubin Total: 0.4 mg/dL (ref 0.0–1.2)
CO2: 27 mmol/L (ref 20–29)
Calcium: 9.3 mg/dL (ref 8.6–10.2)
Chloride: 97 mmol/L (ref 96–106)
Creatinine, Ser: 1.31 mg/dL — ABNORMAL HIGH (ref 0.76–1.27)
Globulin, Total: 2.5 g/dL (ref 1.5–4.5)
Glucose: 79 mg/dL (ref 70–99)
Potassium: 4.5 mmol/L (ref 3.5–5.2)
Sodium: 140 mmol/L (ref 134–144)
Total Protein: 6.5 g/dL (ref 6.0–8.5)
eGFR: 55 mL/min/{1.73_m2} — ABNORMAL LOW (ref >59)

## 2023-02-03 LAB — CBC WITH DIFFERENTIAL/PLATELET
Basophils Absolute: 0 10*3/uL (ref 0.0–0.2)
Basos: 0 %
EOS (ABSOLUTE): 0 10*3/uL (ref 0.0–0.4)
Eos: 0 %
Hematocrit: 45.1 % (ref 37.5–51.0)
Hemoglobin: 14.3 g/dL (ref 13.0–17.7)
Immature Grans (Abs): 0.1 10*3/uL (ref 0.0–0.1)
Immature Granulocytes: 1 %
Lymphocytes Absolute: 0.9 10*3/uL (ref 0.7–3.1)
Lymphs: 11 %
MCH: 28.1 pg (ref 26.6–33.0)
MCHC: 31.7 g/dL (ref 31.5–35.7)
MCV: 89 fL (ref 79–97)
Monocytes Absolute: 0.6 10*3/uL (ref 0.1–0.9)
Monocytes: 7 %
Neutrophils Absolute: 7.1 10*3/uL — ABNORMAL HIGH (ref 1.4–7.0)
Neutrophils: 81 %
Platelets: 161 10*3/uL (ref 150–450)
RBC: 5.08 x10E6/uL (ref 4.14–5.80)
RDW: 14.9 % (ref 11.6–15.4)
WBC: 8.7 10*3/uL (ref 3.4–10.8)

## 2023-02-03 NOTE — Telephone Encounter (Signed)
Copied from CRM [id number]. Topic: Clinical - Home Health Verbal Orders >> Feb 03, 2023  3:57 PM Deaijah H wrote: Caller/Agency: [facility]  Callback Number: [phone] Service Requested: Nurse needed Frequency: 2 wk 2  1 wk 7          3 as needed CHF  Any new concerns about the patient? No

## 2023-02-03 NOTE — Telephone Encounter (Signed)
VO given for [facility] nursing frequency.

## 2023-02-04 DIAGNOSIS — J449 Chronic obstructive pulmonary disease, unspecified: Secondary | ICD-10-CM | POA: Diagnosis not present

## 2023-02-06 ENCOUNTER — Ambulatory Visit: Payer: Medicare Other | Admitting: Family

## 2023-02-09 DIAGNOSIS — J449 Chronic obstructive pulmonary disease, unspecified: Secondary | ICD-10-CM | POA: Diagnosis not present

## 2023-02-10 DIAGNOSIS — J449 Chronic obstructive pulmonary disease, unspecified: Secondary | ICD-10-CM | POA: Diagnosis not present

## 2023-02-12 ENCOUNTER — Other Ambulatory Visit: Payer: Self-pay | Admitting: Family

## 2023-02-12 DIAGNOSIS — F419 Anxiety disorder, unspecified: Secondary | ICD-10-CM

## 2023-02-12 DIAGNOSIS — I10 Essential (primary) hypertension: Secondary | ICD-10-CM

## 2023-02-12 DIAGNOSIS — E785 Hyperlipidemia, unspecified: Secondary | ICD-10-CM

## 2023-02-12 DIAGNOSIS — E8881 Metabolic syndrome: Secondary | ICD-10-CM

## 2023-02-12 DIAGNOSIS — R2243 Localized swelling, mass and lump, lower limb, bilateral: Secondary | ICD-10-CM

## 2023-02-16 ENCOUNTER — Other Ambulatory Visit: Payer: Medicare Other

## 2023-02-16 ENCOUNTER — Ambulatory Visit (INDEPENDENT_AMBULATORY_CARE_PROVIDER_SITE_OTHER): Payer: Medicare Other

## 2023-02-16 DIAGNOSIS — Z09 Encounter for follow-up examination after completed treatment for conditions other than malignant neoplasm: Secondary | ICD-10-CM

## 2023-02-16 DIAGNOSIS — Z8616 Personal history of COVID-19: Secondary | ICD-10-CM | POA: Diagnosis not present

## 2023-02-16 DIAGNOSIS — J40 Bronchitis, not specified as acute or chronic: Secondary | ICD-10-CM | POA: Diagnosis not present

## 2023-02-16 DIAGNOSIS — J1282 Pneumonia due to coronavirus disease 2019: Secondary | ICD-10-CM | POA: Diagnosis not present

## 2023-02-16 DIAGNOSIS — J9811 Atelectasis: Secondary | ICD-10-CM | POA: Diagnosis not present

## 2023-02-16 DIAGNOSIS — U071 COVID-19: Secondary | ICD-10-CM

## 2023-02-17 ENCOUNTER — Ambulatory Visit: Payer: Self-pay | Admitting: Family

## 2023-02-17 DIAGNOSIS — J449 Chronic obstructive pulmonary disease, unspecified: Secondary | ICD-10-CM | POA: Diagnosis not present

## 2023-02-17 NOTE — Telephone Encounter (Signed)
Copied from CRM [id number]. Topic: Clinical - Red Word Triage >> Feb 17, 2023  2:36 PM Orinda Kenner C wrote: Red Word that prompted transfer to Nurse Triage: Joice Lofts [phone] nurse [facility]. Warm and redness left lower leg possible cellulitis, no pain started today. Had 7 lbs weight gain since last week  Chief Complaint: left lower leg swelling per home health nurse Symptoms: 1+ edema to left lower leg Frequency: was not present last week, patient does not recall when leg swelling started but the redness started this week.  Nurse also reports that patient had a 7lb weight gain. Pertinent Negatives: Patient denies sob, chest pain, fever Disposition: [] ED /[] Urgent Care (no appt availability in office) / [x] Appointment(In office/virtual)/ []  [facility]/ [] Home Care/ [] Refused Recommended Disposition /[] [place] [facility]/ []  Follow-up with PCP Additional Notes: Patient's home health nurse called in to report to NP that his LLE is edematous, 1+.  Nurse reports that swelling and redness was not present last week.  Patient has also experienced a 7 pound weight gain since last week.  Denies fever, sob and cp.  Apt made for tomorrow to see pcp.   Reason for Disposition  [1] MODERATE leg swelling (e.g., swelling extends up to knees) AND [2] new-onset or worsening  Answer Assessment - Initial Assessment Questions 1. ONSET: "When did the swelling start?" (e.g., minutes, hours, days)     Did not have swelling last week per home health nurse 2. LOCATION: "What part of the leg is swollen?"  "Are both legs swollen or just one leg?"     Right lower leg 3. SEVERITY: "How bad is the swelling?" (e.g., localized; mild, moderate, severe)   - Localized: Small area of swelling localized to one leg.   - MILD pedal edema: Swelling limited to foot and ankle, pitting edema < 1/4 inch (6 mm) deep, rest and elevation eliminate most or all swelling.   - MODERATE edema: Swelling of lower leg to  knee, pitting edema > 1/4 inch (6 mm) deep, rest and elevation only partially reduce swelling.   - SEVERE edema: Swelling extends above knee, facial or hand swelling present.      Mild to right leg, one plus 4. REDNESS: "Does the swelling look red or infected?"     Red and warm 5. PAIN: "Is the swelling painful to touch?" If Yes, ask: "How painful is it?"   (Scale 1-10; mild, moderate or severe)     No  6. FEVER: "Do you have a fever?" If Yes, ask: "What is it, how was it measured, and when did it start?"      no 7. CAUSE: "What do you think is causing the leg swelling?"     yes 8. MEDICAL HISTORY: "Do you have a history of blood clots (e.g., DVT), cancer, heart failure, kidney disease, or liver failure?"     no 9. RECURRENT SYMPTOM: "Have you had leg swelling before?" If Yes, ask: "When was the last time?" "What happened that time?"     Yes, was in the hospital recently for covid and pneumonia 10. OTHER SYMPTOMS: "Do you have any other symptoms?" (e.g., chest pain, difficulty breathing)       Has had a weight 7 lbs.  Protocols used: Leg Swelling and Edema-A-AH

## 2023-02-18 ENCOUNTER — Ambulatory Visit (INDEPENDENT_AMBULATORY_CARE_PROVIDER_SITE_OTHER): Payer: Medicare Other | Admitting: Family Medicine

## 2023-02-18 VITALS — BP 140/77 | HR 57 | Temp 97.2°F | Ht 69.0 in | Wt 313.2 lb

## 2023-02-18 DIAGNOSIS — L03115 Cellulitis of right lower limb: Secondary | ICD-10-CM | POA: Diagnosis not present

## 2023-02-18 DIAGNOSIS — I872 Venous insufficiency (chronic) (peripheral): Secondary | ICD-10-CM | POA: Diagnosis not present

## 2023-02-18 MED ORDER — POTASSIUM CHLORIDE CRYS ER 10 MEQ PO TBCR: 10.0000 meq | EXTENDED_RELEASE_TABLET | Freq: Two times a day (BID) | ORAL | 0 refills | Status: DC

## 2023-02-18 MED ORDER — CIPROFLOXACIN HCL 500 MG PO TABS: 500.0000 mg | ORAL_TABLET | Freq: Two times a day (BID) | ORAL | 0 refills | Status: DC

## 2023-02-18 MED ORDER — FUROSEMIDE 20 MG PO TABS: 40.0000 mg | ORAL_TABLET | Freq: Two times a day (BID) | ORAL | 0 refills | Status: DC

## 2023-02-18 NOTE — Progress Notes (Signed)
Swollen, red left leg  Subjective:  Patient ID: Marcus Carr, male    DOB: September 26, 1940  Age: 82 y.o. MRN: [id number]  CC: Leg Swelling (Some swelling and redness, warm to touch)   HPI Marcus Carr presents for left leg getting swollen and red starting after hee got out of [facility] a week ago Was in for Covid pneumonia which has improved. He is keeping the leg elevated and taking lasix 60 mg a day.     02/18/2023   10:15 AM 02/02/2023   12:01 PM 01/21/2023    1:47 PM  Depression screen PHQ 2/9  Decreased Interest 2 1 0  Down, Depressed, Hopeless 1 0 0  PHQ - 2 Score 3 1 0  Altered sleeping 2 2   Tired, decreased energy 2 1   Change in appetite 1 1   Feeling bad or failure about yourself  2 0   Trouble concentrating 1 0   Moving slowly or fidgety/restless 0 0   Suicidal thoughts 0 0   PHQ-9 Score 11 5   Difficult doing work/chores Not difficult at all Not difficult at all     History [place] has a past medical history of Abscess, jaw, Anxiety, Back pain, Chronic kidney disease, Chronic mastoiditis of both sides (11/07/2014), Cigarette smoker (11/07/2014), COPD (chronic obstructive pulmonary disease) (HCC), Gout, Hyperlipidemia, Hypertension, Morbid obesity (HCC) (11/07/2014), Osteomyelitis of mandible (11/07/2014), and Thyroid nodule (11/07/2014).   He has a past surgical history that includes Appendectomy and Tonsillectomy.   His family history includes Cancer in his mother; Heart attack (age of onset: 80) in his father.He reports that he quit smoking about 8 years ago. His smoking use included cigarettes. He started smoking about 58 years ago. He has a 75 pack-year smoking history. He has never used smokeless tobacco. He reports that he does not drink alcohol and does not use drugs.    ROS Review of Systems  Objective:  BP (!) 140/77   Pulse (!) 57   Temp (!) 97.2 F (36.2 C)   Ht 5\' 9"  (1.753 m)   Wt (!) 313 lb 3.2 oz (142.1 kg)   SpO2 (!) 89% Comment:  2 L of O2  BMI 46.25 kg/m   BP Readings from Last 3 Encounters:  02/18/23 (!) 140/77  02/02/23 119/69  11/06/22 134/85    Wt Readings from Last 3 Encounters:  02/18/23 (!) 313 lb 3.2 oz (142.1 kg)  01/21/23 300 lb (136.1 kg)  01/09/22 281 lb (127.5 kg)     Physical Exam Vitals reviewed.  Constitutional:      Appearance: He is well-developed.  HENT:     Head: Normocephalic and atraumatic.     Right Ear: External ear normal.     Left Ear: External ear normal.     Mouth/Throat:     Pharynx: No oropharyngeal exudate or posterior oropharyngeal erythema.  Eyes:     Pupils: Pupils are equal, round, and reactive to light.  Cardiovascular:     Rate and Rhythm: Normal rate and regular rhythm.     Heart sounds: No murmur heard. Pulmonary:     Effort: No respiratory distress.     Breath sounds: Normal breath sounds.  Musculoskeletal:     Cervical back: Normal range of motion and neck supple.  Skin:    General: Skin is warm and dry.     Findings: Erythema (with edema, lower half of right lower ext.) present.  Neurological:     Mental Status:  He is alert and oriented to person, place, and time.       Assessment & Plan:   Jaishon was seen today for leg swelling.  Diagnoses and all orders for this visit:  Stasis dermatitis  Cellulitis of right lower extremity  Other orders -     furosemide (LASIX) 20 MG tablet; Take 2 tablets (40 mg total) by mouth 2 (two) times daily. (With breakfast and lunch) -     potassium chloride (KLOR-CON M) 10 MEQ tablet; Take 1 tablet (10 mEq total) by mouth 2 (two) times daily. -     ciprofloxacin (CIPRO) 500 MG tablet; Take 1 tablet (500 mg total) by mouth 2 (two) times daily.       I have discontinued Male P. Haydon's furosemide. I am also having him start on furosemide, potassium chloride, and ciprofloxacin. Additionally, I am having him maintain his fish oil-omega-3 fatty acids, cholecalciferol, oxyCODONE-acetaminophen, magnesium (amino  acid chelate), diclofenac sodium, cetirizine, ProAir RespiClick, aspirin EC, blood glucose meter kit and supplies, blood glucose meter kit and supplies, Accu-Chek Guide, fluticasone, verapamil, albuterol, gabapentin, glimepiride, Accu-Chek Softclix Lancets, indomethacin, atorvastatin, and citalopram.  Allergies as of 02/18/2023       Reactions   Penicillins Rash   Other reaction(s): Not available Did it involve swelling of the face/tongue/throat, SOB, or low BP? No Did it involve sudden or severe rash/hives, skin peeling, or any reaction on the inside of your mouth or nose? Yes Did you need to seek medical attention at a hospital or doctor's office? No When did it last happen?    Over 30 years   If all above answers are "NO", may proceed with cephalosporin use.        Medication List        Accurate as of February 18, 2023 11:59 PM. If you have any questions, ask your nurse or doctor.          Accu-Chek Guide test strip Generic drug: glucose blood Test BS up to 4 times daily Dx E11.42   Accu-Chek Softclix Lancets lancets USE UP TO 4 TIMES DAILY AS DIRECTED (E10.9, E11.9)   aspirin EC 81 MG tablet Take 81 mg by mouth daily. Swallow whole.   atorvastatin 20 MG tablet Commonly known as: LIPITOR TAKE 1 TABLET BY MOUTH EVERY DAY   blood glucose meter kit and supplies Dispense based on patient and insurance preference. Use up to four times daily as directed. (FOR ICD-10 E10.9, E11.9).   blood glucose meter kit and supplies Kit Dispense based on patient and insurance preference. Use up to four times daily as directed.   cetirizine 10 MG tablet Commonly known as: ZYRTEC Take 1 tablet (10 mg total) by mouth daily.   cholecalciferol 10 MCG (400 UNIT) Tabs tablet Commonly known as: VITAMIN D3 Take 800 Units by mouth daily.   ciprofloxacin 500 MG tablet Commonly known as: Cipro Take 1 tablet (500 mg total) by mouth 2 (two) times daily.   citalopram 20 MG tablet Commonly  known as: CELEXA TAKE 1 TABLET BY MOUTH EVERY DAY   diclofenac sodium 1 % Gel Commonly known as: Voltaren Apply 4 g topically 4 (four) times daily. To left knee for pain   fish oil-omega-3 fatty acids 1000 MG capsule Take 1 g by mouth daily.   fluticasone 50 MCG/ACT nasal spray Commonly known as: FLONASE SPRAY 2 SPRAYS INTO EACH NOSTRIL EVERY DAY   furosemide 20 MG tablet Commonly known as: LASIX Take 2 tablets (40 mg  total) by mouth 2 (two) times daily. (With breakfast and lunch) What changed:  medication strength when to take this additional instructions   gabapentin 100 MG capsule Commonly known as: NEURONTIN TAKE 1 CAPSULE (100 MG TOTAL) BY MOUTH THREE TIMES DAILY.   glimepiride 1 MG tablet Commonly known as: AMARYL Take 1 tablet (1 mg total) by mouth daily with breakfast. Dx Marchia Bond E88.810   indomethacin 50 MG capsule Commonly known as: INDOCIN TAKE 1 CAPSULE BY MOUTH THREE TIMES A DAY AS NEEDED   magnesium (amino acid chelate) 133 MG tablet Take 1 tablet by mouth daily.   oxyCODONE-acetaminophen 10-325 MG tablet Commonly known as: PERCOCET Take 1 tablet by mouth every 12 (twelve) hours as needed. What changed: reasons to take this   potassium chloride 10 MEQ tablet Commonly known as: KLOR-CON M Take 1 tablet (10 mEq total) by mouth 2 (two) times daily.   ProAir RespiClick 108 (90 Base) MCG/ACT Aepb Generic drug: Albuterol Sulfate Inhale 1-2 puffs into the lungs every 6 (six) weeks. prn   albuterol 108 (90 Base) MCG/ACT inhaler Commonly known as: VENTOLIN HFA INHALE 2 PUFFS INTO THE LUNGS EVERY 6 HOURS AS NEEDED FOR WHEEZE OR SHORTNESS OF BREATH   verapamil 120 MG CR tablet Commonly known as: CALAN-SR TAKE 1 TABLET BY MOUTH AT  BEDTIME         Follow-up: Return in about 2 weeks (around 03/04/2023).  Mechele Claude, M.D.

## 2023-02-20 DIAGNOSIS — J449 Chronic obstructive pulmonary disease, unspecified: Secondary | ICD-10-CM | POA: Diagnosis not present

## 2023-02-22 ENCOUNTER — Encounter: Payer: Self-pay | Admitting: Family Medicine

## 2023-02-24 ENCOUNTER — Other Ambulatory Visit: Payer: Self-pay | Admitting: Family

## 2023-02-24 ENCOUNTER — Telehealth: Payer: Self-pay | Admitting: Family Medicine

## 2023-02-24 DIAGNOSIS — J449 Chronic obstructive pulmonary disease, unspecified: Secondary | ICD-10-CM | POA: Diagnosis not present

## 2023-02-24 DIAGNOSIS — I10 Essential (primary) hypertension: Secondary | ICD-10-CM

## 2023-02-24 NOTE — Telephone Encounter (Signed)
VO given to add Child psychotherapist

## 2023-02-24 NOTE — Telephone Encounter (Signed)
Copied from CRM [id number]. Topic: Clinical - Home Health Verbal Orders >> Feb 24, 2023  1:23 PM Theodis Sato wrote: Reason for CRM: [place] from home health states this message to be seen by Jannifer Rodney- PT has had 4 falls in 6 days due to passing out -needs verbal orders in order to request a social worker evaluation.

## 2023-02-25 DIAGNOSIS — G894 Chronic pain syndrome: Secondary | ICD-10-CM | POA: Diagnosis not present

## 2023-02-27 DIAGNOSIS — U071 COVID-19: Secondary | ICD-10-CM | POA: Diagnosis not present

## 2023-02-27 DIAGNOSIS — J441 Chronic obstructive pulmonary disease with (acute) exacerbation: Secondary | ICD-10-CM | POA: Diagnosis not present

## 2023-03-02 ENCOUNTER — Ambulatory Visit (INDEPENDENT_AMBULATORY_CARE_PROVIDER_SITE_OTHER): Payer: Medicare Other | Admitting: Family

## 2023-03-02 ENCOUNTER — Encounter: Payer: Self-pay | Admitting: Family

## 2023-03-02 VITALS — BP 133/80 | HR 64 | Temp 97.3°F | Ht 69.0 in

## 2023-03-02 DIAGNOSIS — R55 Syncope and collapse: Secondary | ICD-10-CM | POA: Diagnosis not present

## 2023-03-02 DIAGNOSIS — I5032 Chronic diastolic (congestive) heart failure: Secondary | ICD-10-CM

## 2023-03-02 DIAGNOSIS — R531 Weakness: Secondary | ICD-10-CM

## 2023-03-02 DIAGNOSIS — I10 Essential (primary) hypertension: Secondary | ICD-10-CM

## 2023-03-02 NOTE — Patient Instructions (Signed)
Syncope, Adult  Syncope refers to a condition in which a person temporarily loses consciousness. Syncope may also be called fainting or passing out. It is caused by a sudden decrease in blood flow to the brain. This can happen for a variety of reasons. Most causes of syncope are not dangerous. It can be triggered by things such as needle sticks, seeing blood, pain, or intense emotion. However, syncope can also be a sign of a serious medical problem, such as a heart abnormality. Other causes can include dehydration, migraines, or taking medicines that lower blood pressure. Your health care provider may do tests to find the reason why you are having syncope. If you faint, get medical help right away. Call your local emergency services (911 in the [place] Follow these instructions at home: Pay attention to any changes in your symptoms. Take these actions to stay safe and to help relieve your symptoms: Knowing when you may be about to faint Signs that you may be about to faint include: Feeling dizzy, weak, light-headed, or like the room is spinning. Feeling nauseous. Seeing spots or seeing all white or all black in your field of vision. Having cold, clammy skin or feeling warm and sweaty. Hearing ringing in the ears (tinnitus). If you start to feel like you might faint, sit or lie down right away. If sitting, put your head down between your legs. If lying down, raise (elevate) your feet above the level of your heart. Breathe deeply and steadily. Wait until all the symptoms have passed. Have someone stay with you until you feel stable. Medicines Take over-the-counter and prescription medicines only as told by your health care provider. If you are taking blood pressure or heart medicine, get up slowly and take several minutes to sit and then stand. This can reduce dizziness and decrease the risk of syncope. Lifestyle Do not drive, use machinery, or play sports until your health care provider says it is  okay. Do not drink alcohol. Do not use any products that contain nicotine or tobacco. These products include cigarettes, chewing tobacco, and vaping devices, such as e-cigarettes. If you need help quitting, ask your health care provider. Avoid hot tubs and saunas. General instructions Talk with your health care provider about your symptoms. You may need to have testing to understand the cause of your syncope. Drink enough fluid to keep your urine pale yellow. Avoid prolonged standing. If you must stand for a long time, do movements such as: Moving your legs. Crossing your legs. Flexing and stretching your leg muscles. Squatting. Keep all follow-up visits. This is important. Contact a health care provider if: You have episodes of near fainting. Get help right away if: You faint. You hit your head or are injured after fainting. You have any of these symptoms that may indicate trouble with your heart: Fast or irregular heartbeats (palpitations). Unusual pain in your chest, abdomen, or back. Shortness of breath. You have a seizure. You have a severe headache. You are confused. You have vision problems. You have severe weakness or trouble walking. You are bleeding from your mouth or rectum, or you have black or tarry stool. These symptoms may represent a serious problem that is an emergency. Do not wait to see if your symptoms will go away. Get medical help right away. Call your local emergency services (911 in the [place] Do not drive yourself to the hospital. Summary Syncope refers to a condition in which a person temporarily loses consciousness. Syncope may also be called   fainting or passing out. It is caused by a sudden decrease in blood flow to the brain. Signs that you may be about to faint include dizziness, feeling light-headed, feeling nauseous, sudden vision changes, or cold, clammy skin. Even though most causes of syncope are not dangerous, syncope can be a sign of a serious  medical problem. Get help right away if you faint. If you start to feel like you might faint, sit or lie down right away. If sitting, put your head down between your legs. If lying down, raise (elevate) your feet above the level of your heart. This information is not intended to replace advice given to you by your health care provider. Make sure you discuss any questions you have with your health care provider. Document Revised: 07/19/2020 Document Reviewed: 07/19/2020 Elsevier Patient Education  2024 Elsevier Inc.  

## 2023-03-02 NOTE — Progress Notes (Signed)
Subjective:    Patient ID: Marcus Carr, male    DOB: [birth year], 82 y.o.   MRN: [id number]  Chief Complaint  Patient presents with   having spells    Eyes half open    Pt presents to the office today with complaints of weakness and syncope episodes. Reports this has been going on for 2 years, but over the last few months it has worsen.   Daughter states two weeks ago he was making noises and went to check on him and he was laying on the couch and his breathing was "different" and his eyes were half closed. Wife called EMS and said he was stable.   He has fallen twice in the last week. Reports he bent over to pick up toilet paper he dropped and started walking to the living room.   He saw Cardiologists 05/16/22 for syncope episode. He was suppose to have a loop recorder implant for long term monitoring. However, he never followed up with this. Will place new referral for this.   He has an appointment for sleep study 12/30.  Hypertension This is a chronic problem. The current episode started more than 1 year ago.      Review of Systems  All other systems reviewed and are negative.      Objective:   Physical Exam Vitals reviewed.  Constitutional:      General: He is not in acute distress.    Appearance: He is well-developed. He is obese.  HENT:     Head: Normocephalic.  Eyes:     General:        Right eye: No discharge.        Left eye: No discharge.     Pupils: Pupils are equal, round, and reactive to light.  Neck:     Thyroid: No thyromegaly.  Cardiovascular:     Rate and Rhythm: Normal rate and regular rhythm.     Heart sounds: Normal heart sounds. No murmur heard. Pulmonary:     Effort: Pulmonary effort is normal. No respiratory distress.     Breath sounds: Normal breath sounds. No wheezing.  Abdominal:     General: Bowel sounds are normal. There is no distension.     Palpations: Abdomen is soft.     Tenderness: There is no abdominal tenderness.   Musculoskeletal:        General: No tenderness. Normal range of motion.     Cervical back: Normal range of motion and neck supple.     Right lower leg: Edema (2+) present.     Left lower leg: Edema (2+) present.  Skin:    General: Skin is warm and dry.     Findings: No erythema or rash.  Neurological:     Mental Status: He is alert and oriented to person, place, and time.     Cranial Nerves: No cranial nerve deficit.     Motor: Weakness present.     Gait: Gait abnormal.     Deep Tendon Reflexes: Reflexes are normal and symmetric.  Psychiatric:        Behavior: Behavior normal.        Thought Content: Thought content normal.        Judgment: Judgment normal.       BP 133/80   Pulse 64   Temp (!) 97.3 F (36.3 C) (Temporal)   Ht 5\' 9"  (1.753 m)   SpO2 93% Comment: on 2 litters  BMI 46.25 kg/m  Assessment & Plan:   Marcus Carr comes in today with chief complaint of having spells (Eyes half open )   Diagnosis and orders addressed:  1. Syncope, unspecified syncope type - EKG 12-Lead - Ambulatory referral to Cardiology  2. Vasovagal syncope - Ambulatory referral to Cardiology  3. Weakness - Ambulatory referral to Cardiology  4. Primary hypertension - Ambulatory referral to Cardiology  5. Morbid obesity (HCC) - Ambulatory referral to Cardiology  6. Generalized weakness - Ambulatory referral to Cardiology  7. Chronic diastolic CHF (congestive heart failure) (HCC) - Ambulatory referral to Cardiology   Labs reviewed from 02/02/23. I have placed referral to Cardiologists for follow up given current symptoms.  Keep follow up with Sleep Med to rule out sleep apnea.  Health Maintenance reviewed Diet and exercise encouraged  Follow up plan: Keep chronic follow up   Jannifer Rodney, FNP

## 2023-03-03 ENCOUNTER — Ambulatory Visit: Payer: Medicare Other | Admitting: Family

## 2023-03-03 DIAGNOSIS — J449 Chronic obstructive pulmonary disease, unspecified: Secondary | ICD-10-CM | POA: Diagnosis not present

## 2023-03-04 DIAGNOSIS — J449 Chronic obstructive pulmonary disease, unspecified: Secondary | ICD-10-CM | POA: Diagnosis not present

## 2023-03-06 ENCOUNTER — Telehealth: Payer: Self-pay | Admitting: Family

## 2023-03-06 NOTE — Telephone Encounter (Signed)
LMOVM VO given to move eval to next week.

## 2023-03-06 NOTE — Telephone Encounter (Signed)
Copied from CRM [id number]. Topic: General - Other >> Mar 06, 2023  9:24 AM Deaijah H wrote: Reason for CRM: Lequita Halt summer with [facility] stated wife would like to reschedule patients appointment ; Needing a verbal order to reschedule esw e-valve moved to next week / callback [phone]

## 2023-03-10 DIAGNOSIS — N183 Chronic kidney disease, stage 3 unspecified: Secondary | ICD-10-CM | POA: Diagnosis not present

## 2023-03-10 DIAGNOSIS — E1122 Type 2 diabetes mellitus with diabetic chronic kidney disease: Secondary | ICD-10-CM | POA: Diagnosis not present

## 2023-03-10 DIAGNOSIS — Z9889 Other specified postprocedural states: Secondary | ICD-10-CM | POA: Diagnosis not present

## 2023-03-10 DIAGNOSIS — M8668 Other chronic osteomyelitis, other site: Secondary | ICD-10-CM | POA: Diagnosis not present

## 2023-03-10 DIAGNOSIS — J1282 Pneumonia due to coronavirus disease 2019: Secondary | ICD-10-CM | POA: Diagnosis not present

## 2023-03-10 DIAGNOSIS — G8929 Other chronic pain: Secondary | ICD-10-CM | POA: Diagnosis not present

## 2023-03-10 DIAGNOSIS — M109 Gout, unspecified: Secondary | ICD-10-CM | POA: Diagnosis not present

## 2023-03-10 DIAGNOSIS — I5033 Acute on chronic diastolic (congestive) heart failure: Secondary | ICD-10-CM | POA: Diagnosis not present

## 2023-03-10 DIAGNOSIS — J441 Chronic obstructive pulmonary disease with (acute) exacerbation: Secondary | ICD-10-CM | POA: Diagnosis not present

## 2023-03-10 DIAGNOSIS — Z85820 Personal history of malignant melanoma of skin: Secondary | ICD-10-CM | POA: Diagnosis not present

## 2023-03-10 DIAGNOSIS — J9621 Acute and chronic respiratory failure with hypoxia: Secondary | ICD-10-CM | POA: Diagnosis not present

## 2023-03-10 DIAGNOSIS — D631 Anemia in chronic kidney disease: Secondary | ICD-10-CM | POA: Diagnosis not present

## 2023-03-10 DIAGNOSIS — I130 Hypertensive heart and chronic kidney disease with heart failure and stage 1 through stage 4 chronic kidney disease, or unspecified chronic kidney disease: Secondary | ICD-10-CM | POA: Diagnosis not present

## 2023-03-10 DIAGNOSIS — I2510 Atherosclerotic heart disease of native coronary artery without angina pectoris: Secondary | ICD-10-CM | POA: Diagnosis not present

## 2023-03-10 DIAGNOSIS — E785 Hyperlipidemia, unspecified: Secondary | ICD-10-CM | POA: Diagnosis not present

## 2023-03-10 DIAGNOSIS — Z87891 Personal history of nicotine dependence: Secondary | ICD-10-CM | POA: Diagnosis not present

## 2023-03-10 DIAGNOSIS — E1169 Type 2 diabetes mellitus with other specified complication: Secondary | ICD-10-CM | POA: Diagnosis not present

## 2023-03-10 DIAGNOSIS — U071 COVID-19: Secondary | ICD-10-CM | POA: Diagnosis not present

## 2023-03-10 DIAGNOSIS — D696 Thrombocytopenia, unspecified: Secondary | ICD-10-CM | POA: Diagnosis not present

## 2023-03-10 DIAGNOSIS — Z9049 Acquired absence of other specified parts of digestive tract: Secondary | ICD-10-CM | POA: Diagnosis not present

## 2023-03-10 DIAGNOSIS — G253 Myoclonus: Secondary | ICD-10-CM | POA: Diagnosis not present

## 2023-03-10 DIAGNOSIS — J440 Chronic obstructive pulmonary disease with acute lower respiratory infection: Secondary | ICD-10-CM | POA: Diagnosis not present

## 2023-03-10 DIAGNOSIS — J9602 Acute respiratory failure with hypercapnia: Secondary | ICD-10-CM | POA: Diagnosis not present

## 2023-03-10 DIAGNOSIS — E1165 Type 2 diabetes mellitus with hyperglycemia: Secondary | ICD-10-CM | POA: Diagnosis not present

## 2023-03-12 ENCOUNTER — Other Ambulatory Visit: Payer: Self-pay | Admitting: Family Medicine

## 2023-03-15 ENCOUNTER — Other Ambulatory Visit: Payer: Self-pay | Admitting: Family

## 2023-03-15 DIAGNOSIS — E785 Hyperlipidemia, unspecified: Secondary | ICD-10-CM

## 2023-03-15 DIAGNOSIS — E8881 Metabolic syndrome: Secondary | ICD-10-CM

## 2023-03-16 DIAGNOSIS — I5033 Acute on chronic diastolic (congestive) heart failure: Secondary | ICD-10-CM | POA: Diagnosis not present

## 2023-03-16 DIAGNOSIS — J9621 Acute and chronic respiratory failure with hypoxia: Secondary | ICD-10-CM | POA: Diagnosis not present

## 2023-03-16 DIAGNOSIS — D696 Thrombocytopenia, unspecified: Secondary | ICD-10-CM | POA: Diagnosis not present

## 2023-03-16 DIAGNOSIS — M8668 Other chronic osteomyelitis, other site: Secondary | ICD-10-CM | POA: Diagnosis not present

## 2023-03-16 DIAGNOSIS — Z9049 Acquired absence of other specified parts of digestive tract: Secondary | ICD-10-CM | POA: Diagnosis not present

## 2023-03-16 DIAGNOSIS — Z85820 Personal history of malignant melanoma of skin: Secondary | ICD-10-CM | POA: Diagnosis not present

## 2023-03-16 DIAGNOSIS — G253 Myoclonus: Secondary | ICD-10-CM | POA: Diagnosis not present

## 2023-03-16 DIAGNOSIS — J9602 Acute respiratory failure with hypercapnia: Secondary | ICD-10-CM | POA: Diagnosis not present

## 2023-03-16 DIAGNOSIS — E1165 Type 2 diabetes mellitus with hyperglycemia: Secondary | ICD-10-CM | POA: Diagnosis not present

## 2023-03-16 DIAGNOSIS — J1282 Pneumonia due to coronavirus disease 2019: Secondary | ICD-10-CM | POA: Diagnosis not present

## 2023-03-16 DIAGNOSIS — E1169 Type 2 diabetes mellitus with other specified complication: Secondary | ICD-10-CM | POA: Diagnosis not present

## 2023-03-16 DIAGNOSIS — Z87891 Personal history of nicotine dependence: Secondary | ICD-10-CM | POA: Diagnosis not present

## 2023-03-16 DIAGNOSIS — I130 Hypertensive heart and chronic kidney disease with heart failure and stage 1 through stage 4 chronic kidney disease, or unspecified chronic kidney disease: Secondary | ICD-10-CM | POA: Diagnosis not present

## 2023-03-16 DIAGNOSIS — J441 Chronic obstructive pulmonary disease with (acute) exacerbation: Secondary | ICD-10-CM | POA: Diagnosis not present

## 2023-03-16 DIAGNOSIS — M109 Gout, unspecified: Secondary | ICD-10-CM | POA: Diagnosis not present

## 2023-03-16 DIAGNOSIS — J440 Chronic obstructive pulmonary disease with acute lower respiratory infection: Secondary | ICD-10-CM | POA: Diagnosis not present

## 2023-03-16 DIAGNOSIS — G8929 Other chronic pain: Secondary | ICD-10-CM | POA: Diagnosis not present

## 2023-03-16 DIAGNOSIS — I2510 Atherosclerotic heart disease of native coronary artery without angina pectoris: Secondary | ICD-10-CM | POA: Diagnosis not present

## 2023-03-16 DIAGNOSIS — Z9889 Other specified postprocedural states: Secondary | ICD-10-CM | POA: Diagnosis not present

## 2023-03-16 DIAGNOSIS — U071 COVID-19: Secondary | ICD-10-CM | POA: Diagnosis not present

## 2023-03-16 DIAGNOSIS — D631 Anemia in chronic kidney disease: Secondary | ICD-10-CM | POA: Diagnosis not present

## 2023-03-16 DIAGNOSIS — E1122 Type 2 diabetes mellitus with diabetic chronic kidney disease: Secondary | ICD-10-CM | POA: Diagnosis not present

## 2023-03-16 DIAGNOSIS — E785 Hyperlipidemia, unspecified: Secondary | ICD-10-CM | POA: Diagnosis not present

## 2023-03-16 DIAGNOSIS — N183 Chronic kidney disease, stage 3 unspecified: Secondary | ICD-10-CM | POA: Diagnosis not present

## 2023-03-19 DIAGNOSIS — J441 Chronic obstructive pulmonary disease with (acute) exacerbation: Secondary | ICD-10-CM | POA: Diagnosis not present

## 2023-03-19 DIAGNOSIS — Z85820 Personal history of malignant melanoma of skin: Secondary | ICD-10-CM | POA: Diagnosis not present

## 2023-03-19 DIAGNOSIS — I130 Hypertensive heart and chronic kidney disease with heart failure and stage 1 through stage 4 chronic kidney disease, or unspecified chronic kidney disease: Secondary | ICD-10-CM | POA: Diagnosis not present

## 2023-03-19 DIAGNOSIS — J9602 Acute respiratory failure with hypercapnia: Secondary | ICD-10-CM | POA: Diagnosis not present

## 2023-03-19 DIAGNOSIS — M8668 Other chronic osteomyelitis, other site: Secondary | ICD-10-CM | POA: Diagnosis not present

## 2023-03-19 DIAGNOSIS — Z9889 Other specified postprocedural states: Secondary | ICD-10-CM | POA: Diagnosis not present

## 2023-03-19 DIAGNOSIS — J440 Chronic obstructive pulmonary disease with acute lower respiratory infection: Secondary | ICD-10-CM | POA: Diagnosis not present

## 2023-03-19 DIAGNOSIS — E785 Hyperlipidemia, unspecified: Secondary | ICD-10-CM | POA: Diagnosis not present

## 2023-03-19 DIAGNOSIS — J1282 Pneumonia due to coronavirus disease 2019: Secondary | ICD-10-CM | POA: Diagnosis not present

## 2023-03-19 DIAGNOSIS — D631 Anemia in chronic kidney disease: Secondary | ICD-10-CM | POA: Diagnosis not present

## 2023-03-19 DIAGNOSIS — M109 Gout, unspecified: Secondary | ICD-10-CM | POA: Diagnosis not present

## 2023-03-19 DIAGNOSIS — E1169 Type 2 diabetes mellitus with other specified complication: Secondary | ICD-10-CM | POA: Diagnosis not present

## 2023-03-19 DIAGNOSIS — G253 Myoclonus: Secondary | ICD-10-CM | POA: Diagnosis not present

## 2023-03-19 DIAGNOSIS — U071 COVID-19: Secondary | ICD-10-CM | POA: Diagnosis not present

## 2023-03-19 DIAGNOSIS — E1165 Type 2 diabetes mellitus with hyperglycemia: Secondary | ICD-10-CM | POA: Diagnosis not present

## 2023-03-19 DIAGNOSIS — I2510 Atherosclerotic heart disease of native coronary artery without angina pectoris: Secondary | ICD-10-CM | POA: Diagnosis not present

## 2023-03-19 DIAGNOSIS — N183 Chronic kidney disease, stage 3 unspecified: Secondary | ICD-10-CM | POA: Diagnosis not present

## 2023-03-19 DIAGNOSIS — Z9049 Acquired absence of other specified parts of digestive tract: Secondary | ICD-10-CM | POA: Diagnosis not present

## 2023-03-19 DIAGNOSIS — I5033 Acute on chronic diastolic (congestive) heart failure: Secondary | ICD-10-CM | POA: Diagnosis not present

## 2023-03-19 DIAGNOSIS — Z87891 Personal history of nicotine dependence: Secondary | ICD-10-CM | POA: Diagnosis not present

## 2023-03-19 DIAGNOSIS — D696 Thrombocytopenia, unspecified: Secondary | ICD-10-CM | POA: Diagnosis not present

## 2023-03-19 DIAGNOSIS — G8929 Other chronic pain: Secondary | ICD-10-CM | POA: Diagnosis not present

## 2023-03-19 DIAGNOSIS — E1122 Type 2 diabetes mellitus with diabetic chronic kidney disease: Secondary | ICD-10-CM | POA: Diagnosis not present

## 2023-03-19 DIAGNOSIS — J9621 Acute and chronic respiratory failure with hypoxia: Secondary | ICD-10-CM | POA: Diagnosis not present

## 2023-03-23 DIAGNOSIS — N1832 Chronic kidney disease, stage 3b: Secondary | ICD-10-CM | POA: Diagnosis not present

## 2023-03-23 DIAGNOSIS — R0683 Snoring: Secondary | ICD-10-CM | POA: Diagnosis not present

## 2023-03-23 DIAGNOSIS — E1142 Type 2 diabetes mellitus with diabetic polyneuropathy: Secondary | ICD-10-CM | POA: Diagnosis not present

## 2023-03-23 DIAGNOSIS — N179 Acute kidney failure, unspecified: Secondary | ICD-10-CM | POA: Diagnosis not present

## 2023-03-24 DIAGNOSIS — Z87891 Personal history of nicotine dependence: Secondary | ICD-10-CM | POA: Diagnosis not present

## 2023-03-24 DIAGNOSIS — E1169 Type 2 diabetes mellitus with other specified complication: Secondary | ICD-10-CM | POA: Diagnosis not present

## 2023-03-24 DIAGNOSIS — E785 Hyperlipidemia, unspecified: Secondary | ICD-10-CM | POA: Diagnosis not present

## 2023-03-24 DIAGNOSIS — G8929 Other chronic pain: Secondary | ICD-10-CM | POA: Diagnosis not present

## 2023-03-24 DIAGNOSIS — M109 Gout, unspecified: Secondary | ICD-10-CM | POA: Diagnosis not present

## 2023-03-24 DIAGNOSIS — D696 Thrombocytopenia, unspecified: Secondary | ICD-10-CM | POA: Diagnosis not present

## 2023-03-24 DIAGNOSIS — D631 Anemia in chronic kidney disease: Secondary | ICD-10-CM | POA: Diagnosis not present

## 2023-03-24 DIAGNOSIS — J440 Chronic obstructive pulmonary disease with acute lower respiratory infection: Secondary | ICD-10-CM | POA: Diagnosis not present

## 2023-03-24 DIAGNOSIS — J9621 Acute and chronic respiratory failure with hypoxia: Secondary | ICD-10-CM | POA: Diagnosis not present

## 2023-03-24 DIAGNOSIS — U071 COVID-19: Secondary | ICD-10-CM | POA: Diagnosis not present

## 2023-03-24 DIAGNOSIS — Z9049 Acquired absence of other specified parts of digestive tract: Secondary | ICD-10-CM | POA: Diagnosis not present

## 2023-03-24 DIAGNOSIS — M8668 Other chronic osteomyelitis, other site: Secondary | ICD-10-CM | POA: Diagnosis not present

## 2023-03-24 DIAGNOSIS — G253 Myoclonus: Secondary | ICD-10-CM | POA: Diagnosis not present

## 2023-03-24 DIAGNOSIS — I2510 Atherosclerotic heart disease of native coronary artery without angina pectoris: Secondary | ICD-10-CM | POA: Diagnosis not present

## 2023-03-24 DIAGNOSIS — J9602 Acute respiratory failure with hypercapnia: Secondary | ICD-10-CM | POA: Diagnosis not present

## 2023-03-24 DIAGNOSIS — I130 Hypertensive heart and chronic kidney disease with heart failure and stage 1 through stage 4 chronic kidney disease, or unspecified chronic kidney disease: Secondary | ICD-10-CM | POA: Diagnosis not present

## 2023-03-24 DIAGNOSIS — N183 Chronic kidney disease, stage 3 unspecified: Secondary | ICD-10-CM | POA: Diagnosis not present

## 2023-03-24 DIAGNOSIS — Z9889 Other specified postprocedural states: Secondary | ICD-10-CM | POA: Diagnosis not present

## 2023-03-24 DIAGNOSIS — E1122 Type 2 diabetes mellitus with diabetic chronic kidney disease: Secondary | ICD-10-CM | POA: Diagnosis not present

## 2023-03-24 DIAGNOSIS — J441 Chronic obstructive pulmonary disease with (acute) exacerbation: Secondary | ICD-10-CM | POA: Diagnosis not present

## 2023-03-24 DIAGNOSIS — J1282 Pneumonia due to coronavirus disease 2019: Secondary | ICD-10-CM | POA: Diagnosis not present

## 2023-03-24 DIAGNOSIS — I5033 Acute on chronic diastolic (congestive) heart failure: Secondary | ICD-10-CM | POA: Diagnosis not present

## 2023-03-24 DIAGNOSIS — Z85820 Personal history of malignant melanoma of skin: Secondary | ICD-10-CM | POA: Diagnosis not present

## 2023-03-24 DIAGNOSIS — E1165 Type 2 diabetes mellitus with hyperglycemia: Secondary | ICD-10-CM | POA: Diagnosis not present

## 2023-03-26 DIAGNOSIS — Z9049 Acquired absence of other specified parts of digestive tract: Secondary | ICD-10-CM | POA: Diagnosis not present

## 2023-03-26 DIAGNOSIS — Z87891 Personal history of nicotine dependence: Secondary | ICD-10-CM | POA: Diagnosis not present

## 2023-03-26 DIAGNOSIS — E1169 Type 2 diabetes mellitus with other specified complication: Secondary | ICD-10-CM | POA: Diagnosis not present

## 2023-03-26 DIAGNOSIS — D631 Anemia in chronic kidney disease: Secondary | ICD-10-CM | POA: Diagnosis not present

## 2023-03-26 DIAGNOSIS — J440 Chronic obstructive pulmonary disease with acute lower respiratory infection: Secondary | ICD-10-CM | POA: Diagnosis not present

## 2023-03-26 DIAGNOSIS — Z9889 Other specified postprocedural states: Secondary | ICD-10-CM | POA: Diagnosis not present

## 2023-03-26 DIAGNOSIS — J9621 Acute and chronic respiratory failure with hypoxia: Secondary | ICD-10-CM | POA: Diagnosis not present

## 2023-03-26 DIAGNOSIS — N183 Chronic kidney disease, stage 3 unspecified: Secondary | ICD-10-CM | POA: Diagnosis not present

## 2023-03-26 DIAGNOSIS — E785 Hyperlipidemia, unspecified: Secondary | ICD-10-CM | POA: Diagnosis not present

## 2023-03-26 DIAGNOSIS — J441 Chronic obstructive pulmonary disease with (acute) exacerbation: Secondary | ICD-10-CM | POA: Diagnosis not present

## 2023-03-26 DIAGNOSIS — I130 Hypertensive heart and chronic kidney disease with heart failure and stage 1 through stage 4 chronic kidney disease, or unspecified chronic kidney disease: Secondary | ICD-10-CM | POA: Diagnosis not present

## 2023-03-26 DIAGNOSIS — Z85820 Personal history of malignant melanoma of skin: Secondary | ICD-10-CM | POA: Diagnosis not present

## 2023-03-26 DIAGNOSIS — E1122 Type 2 diabetes mellitus with diabetic chronic kidney disease: Secondary | ICD-10-CM | POA: Diagnosis not present

## 2023-03-26 DIAGNOSIS — G8929 Other chronic pain: Secondary | ICD-10-CM | POA: Diagnosis not present

## 2023-03-26 DIAGNOSIS — J9602 Acute respiratory failure with hypercapnia: Secondary | ICD-10-CM | POA: Diagnosis not present

## 2023-03-26 DIAGNOSIS — I5033 Acute on chronic diastolic (congestive) heart failure: Secondary | ICD-10-CM | POA: Diagnosis not present

## 2023-03-26 DIAGNOSIS — J1282 Pneumonia due to coronavirus disease 2019: Secondary | ICD-10-CM | POA: Diagnosis not present

## 2023-03-26 DIAGNOSIS — E1165 Type 2 diabetes mellitus with hyperglycemia: Secondary | ICD-10-CM | POA: Diagnosis not present

## 2023-03-26 DIAGNOSIS — G253 Myoclonus: Secondary | ICD-10-CM | POA: Diagnosis not present

## 2023-03-26 DIAGNOSIS — M8668 Other chronic osteomyelitis, other site: Secondary | ICD-10-CM | POA: Diagnosis not present

## 2023-03-26 DIAGNOSIS — D696 Thrombocytopenia, unspecified: Secondary | ICD-10-CM | POA: Diagnosis not present

## 2023-03-26 DIAGNOSIS — M109 Gout, unspecified: Secondary | ICD-10-CM | POA: Diagnosis not present

## 2023-03-26 DIAGNOSIS — I2510 Atherosclerotic heart disease of native coronary artery without angina pectoris: Secondary | ICD-10-CM | POA: Diagnosis not present

## 2023-03-26 DIAGNOSIS — U071 COVID-19: Secondary | ICD-10-CM | POA: Diagnosis not present

## 2023-03-28 DIAGNOSIS — G894 Chronic pain syndrome: Secondary | ICD-10-CM | POA: Diagnosis not present

## 2023-03-30 ENCOUNTER — Telehealth: Payer: Self-pay

## 2023-03-30 ENCOUNTER — Ambulatory Visit: Payer: Medicare Other

## 2023-03-30 DIAGNOSIS — E1169 Type 2 diabetes mellitus with other specified complication: Secondary | ICD-10-CM | POA: Diagnosis not present

## 2023-03-30 DIAGNOSIS — N183 Chronic kidney disease, stage 3 unspecified: Secondary | ICD-10-CM | POA: Diagnosis not present

## 2023-03-30 DIAGNOSIS — E785 Hyperlipidemia, unspecified: Secondary | ICD-10-CM | POA: Diagnosis not present

## 2023-03-30 DIAGNOSIS — E1122 Type 2 diabetes mellitus with diabetic chronic kidney disease: Secondary | ICD-10-CM | POA: Diagnosis not present

## 2023-03-30 DIAGNOSIS — J441 Chronic obstructive pulmonary disease with (acute) exacerbation: Secondary | ICD-10-CM | POA: Diagnosis not present

## 2023-03-30 DIAGNOSIS — J9602 Acute respiratory failure with hypercapnia: Secondary | ICD-10-CM | POA: Diagnosis not present

## 2023-03-30 DIAGNOSIS — J440 Chronic obstructive pulmonary disease with acute lower respiratory infection: Secondary | ICD-10-CM

## 2023-03-30 DIAGNOSIS — D631 Anemia in chronic kidney disease: Secondary | ICD-10-CM

## 2023-03-30 DIAGNOSIS — I130 Hypertensive heart and chronic kidney disease with heart failure and stage 1 through stage 4 chronic kidney disease, or unspecified chronic kidney disease: Secondary | ICD-10-CM

## 2023-03-30 DIAGNOSIS — U071 COVID-19: Secondary | ICD-10-CM

## 2023-03-30 DIAGNOSIS — M109 Gout, unspecified: Secondary | ICD-10-CM | POA: Diagnosis not present

## 2023-03-30 DIAGNOSIS — Z9049 Acquired absence of other specified parts of digestive tract: Secondary | ICD-10-CM | POA: Diagnosis not present

## 2023-03-30 DIAGNOSIS — Z87891 Personal history of nicotine dependence: Secondary | ICD-10-CM | POA: Diagnosis not present

## 2023-03-30 DIAGNOSIS — E1165 Type 2 diabetes mellitus with hyperglycemia: Secondary | ICD-10-CM | POA: Diagnosis not present

## 2023-03-30 DIAGNOSIS — D696 Thrombocytopenia, unspecified: Secondary | ICD-10-CM | POA: Diagnosis not present

## 2023-03-30 DIAGNOSIS — M8668 Other chronic osteomyelitis, other site: Secondary | ICD-10-CM | POA: Diagnosis not present

## 2023-03-30 DIAGNOSIS — J1282 Pneumonia due to coronavirus disease 2019: Secondary | ICD-10-CM

## 2023-03-30 DIAGNOSIS — J9621 Acute and chronic respiratory failure with hypoxia: Secondary | ICD-10-CM | POA: Diagnosis not present

## 2023-03-30 DIAGNOSIS — Z9889 Other specified postprocedural states: Secondary | ICD-10-CM | POA: Diagnosis not present

## 2023-03-30 DIAGNOSIS — I2510 Atherosclerotic heart disease of native coronary artery without angina pectoris: Secondary | ICD-10-CM | POA: Diagnosis not present

## 2023-03-30 DIAGNOSIS — I5033 Acute on chronic diastolic (congestive) heart failure: Secondary | ICD-10-CM

## 2023-03-30 DIAGNOSIS — Z85820 Personal history of malignant melanoma of skin: Secondary | ICD-10-CM | POA: Diagnosis not present

## 2023-03-30 DIAGNOSIS — G253 Myoclonus: Secondary | ICD-10-CM | POA: Diagnosis not present

## 2023-03-30 DIAGNOSIS — G8929 Other chronic pain: Secondary | ICD-10-CM | POA: Diagnosis not present

## 2023-03-30 NOTE — Telephone Encounter (Signed)
 Ok to monitor patient, but report any pain. Did he hit his head?

## 2023-03-30 NOTE — Telephone Encounter (Signed)
 Copied from CRM [id number]. Topic: Clinical - Home Health Verbal Orders >> Mar 30, 2023  2:11 PM Curlee DEL wrote: Caller/Agency: [facility] RN) Mitch Number: [phone] - Okay to leave message Service Requested: Skilled Nursing Frequency: 1 week 8 times  Any new concerns about the patient? Yes, patient has had a fall today.

## 2023-03-30 NOTE — Telephone Encounter (Signed)
 TC to Marcus Carr w/ [facility] - VO given for nursing frequency, pt did not hit his head, HH just had to report fall.

## 2023-04-07 DIAGNOSIS — I10 Essential (primary) hypertension: Secondary | ICD-10-CM | POA: Diagnosis not present

## 2023-04-07 DIAGNOSIS — I480 Paroxysmal atrial fibrillation: Secondary | ICD-10-CM | POA: Diagnosis not present

## 2023-04-07 DIAGNOSIS — R55 Syncope and collapse: Secondary | ICD-10-CM | POA: Diagnosis not present

## 2023-04-07 DIAGNOSIS — I2510 Atherosclerotic heart disease of native coronary artery without angina pectoris: Secondary | ICD-10-CM | POA: Diagnosis not present

## 2023-04-09 DIAGNOSIS — J9602 Acute respiratory failure with hypercapnia: Secondary | ICD-10-CM | POA: Diagnosis not present

## 2023-04-09 DIAGNOSIS — G253 Myoclonus: Secondary | ICD-10-CM | POA: Diagnosis not present

## 2023-04-09 DIAGNOSIS — E1169 Type 2 diabetes mellitus with other specified complication: Secondary | ICD-10-CM | POA: Diagnosis not present

## 2023-04-09 DIAGNOSIS — D696 Thrombocytopenia, unspecified: Secondary | ICD-10-CM | POA: Diagnosis not present

## 2023-04-09 DIAGNOSIS — E1165 Type 2 diabetes mellitus with hyperglycemia: Secondary | ICD-10-CM | POA: Diagnosis not present

## 2023-04-09 DIAGNOSIS — J441 Chronic obstructive pulmonary disease with (acute) exacerbation: Secondary | ICD-10-CM | POA: Diagnosis not present

## 2023-04-09 DIAGNOSIS — I5033 Acute on chronic diastolic (congestive) heart failure: Secondary | ICD-10-CM | POA: Diagnosis not present

## 2023-04-09 DIAGNOSIS — Z85820 Personal history of malignant melanoma of skin: Secondary | ICD-10-CM | POA: Diagnosis not present

## 2023-04-09 DIAGNOSIS — M8668 Other chronic osteomyelitis, other site: Secondary | ICD-10-CM | POA: Diagnosis not present

## 2023-04-09 DIAGNOSIS — Z87891 Personal history of nicotine dependence: Secondary | ICD-10-CM | POA: Diagnosis not present

## 2023-04-09 DIAGNOSIS — J440 Chronic obstructive pulmonary disease with acute lower respiratory infection: Secondary | ICD-10-CM | POA: Diagnosis not present

## 2023-04-09 DIAGNOSIS — E1122 Type 2 diabetes mellitus with diabetic chronic kidney disease: Secondary | ICD-10-CM | POA: Diagnosis not present

## 2023-04-09 DIAGNOSIS — J1282 Pneumonia due to coronavirus disease 2019: Secondary | ICD-10-CM | POA: Diagnosis not present

## 2023-04-09 DIAGNOSIS — Z9889 Other specified postprocedural states: Secondary | ICD-10-CM | POA: Diagnosis not present

## 2023-04-09 DIAGNOSIS — U071 COVID-19: Secondary | ICD-10-CM | POA: Diagnosis not present

## 2023-04-09 DIAGNOSIS — N183 Chronic kidney disease, stage 3 unspecified: Secondary | ICD-10-CM | POA: Diagnosis not present

## 2023-04-09 DIAGNOSIS — E785 Hyperlipidemia, unspecified: Secondary | ICD-10-CM | POA: Diagnosis not present

## 2023-04-09 DIAGNOSIS — J9621 Acute and chronic respiratory failure with hypoxia: Secondary | ICD-10-CM | POA: Diagnosis not present

## 2023-04-09 DIAGNOSIS — M109 Gout, unspecified: Secondary | ICD-10-CM | POA: Diagnosis not present

## 2023-04-09 DIAGNOSIS — Z9049 Acquired absence of other specified parts of digestive tract: Secondary | ICD-10-CM | POA: Diagnosis not present

## 2023-04-09 DIAGNOSIS — I130 Hypertensive heart and chronic kidney disease with heart failure and stage 1 through stage 4 chronic kidney disease, or unspecified chronic kidney disease: Secondary | ICD-10-CM | POA: Diagnosis not present

## 2023-04-09 DIAGNOSIS — D631 Anemia in chronic kidney disease: Secondary | ICD-10-CM | POA: Diagnosis not present

## 2023-04-09 DIAGNOSIS — I2510 Atherosclerotic heart disease of native coronary artery without angina pectoris: Secondary | ICD-10-CM | POA: Diagnosis not present

## 2023-04-09 DIAGNOSIS — G8929 Other chronic pain: Secondary | ICD-10-CM | POA: Diagnosis not present

## 2023-04-16 DIAGNOSIS — R0683 Snoring: Secondary | ICD-10-CM | POA: Diagnosis not present

## 2023-04-16 DIAGNOSIS — R400 Somnolence: Secondary | ICD-10-CM | POA: Diagnosis not present

## 2023-04-17 DIAGNOSIS — Z85820 Personal history of malignant melanoma of skin: Secondary | ICD-10-CM | POA: Diagnosis not present

## 2023-04-17 DIAGNOSIS — M8668 Other chronic osteomyelitis, other site: Secondary | ICD-10-CM | POA: Diagnosis not present

## 2023-04-17 DIAGNOSIS — Z87891 Personal history of nicotine dependence: Secondary | ICD-10-CM | POA: Diagnosis not present

## 2023-04-17 DIAGNOSIS — E1169 Type 2 diabetes mellitus with other specified complication: Secondary | ICD-10-CM | POA: Diagnosis not present

## 2023-04-17 DIAGNOSIS — I2510 Atherosclerotic heart disease of native coronary artery without angina pectoris: Secondary | ICD-10-CM | POA: Diagnosis not present

## 2023-04-17 DIAGNOSIS — J1282 Pneumonia due to coronavirus disease 2019: Secondary | ICD-10-CM | POA: Diagnosis not present

## 2023-04-17 DIAGNOSIS — D696 Thrombocytopenia, unspecified: Secondary | ICD-10-CM | POA: Diagnosis not present

## 2023-04-17 DIAGNOSIS — M109 Gout, unspecified: Secondary | ICD-10-CM | POA: Diagnosis not present

## 2023-04-17 DIAGNOSIS — J440 Chronic obstructive pulmonary disease with acute lower respiratory infection: Secondary | ICD-10-CM | POA: Diagnosis not present

## 2023-04-17 DIAGNOSIS — J441 Chronic obstructive pulmonary disease with (acute) exacerbation: Secondary | ICD-10-CM | POA: Diagnosis not present

## 2023-04-17 DIAGNOSIS — E785 Hyperlipidemia, unspecified: Secondary | ICD-10-CM | POA: Diagnosis not present

## 2023-04-17 DIAGNOSIS — G253 Myoclonus: Secondary | ICD-10-CM | POA: Diagnosis not present

## 2023-04-17 DIAGNOSIS — J9602 Acute respiratory failure with hypercapnia: Secondary | ICD-10-CM | POA: Diagnosis not present

## 2023-04-17 DIAGNOSIS — U071 COVID-19: Secondary | ICD-10-CM | POA: Diagnosis not present

## 2023-04-17 DIAGNOSIS — I130 Hypertensive heart and chronic kidney disease with heart failure and stage 1 through stage 4 chronic kidney disease, or unspecified chronic kidney disease: Secondary | ICD-10-CM | POA: Diagnosis not present

## 2023-04-17 DIAGNOSIS — E1122 Type 2 diabetes mellitus with diabetic chronic kidney disease: Secondary | ICD-10-CM | POA: Diagnosis not present

## 2023-04-17 DIAGNOSIS — Z9889 Other specified postprocedural states: Secondary | ICD-10-CM | POA: Diagnosis not present

## 2023-04-17 DIAGNOSIS — J9621 Acute and chronic respiratory failure with hypoxia: Secondary | ICD-10-CM | POA: Diagnosis not present

## 2023-04-17 DIAGNOSIS — D631 Anemia in chronic kidney disease: Secondary | ICD-10-CM | POA: Diagnosis not present

## 2023-04-17 DIAGNOSIS — G8929 Other chronic pain: Secondary | ICD-10-CM | POA: Diagnosis not present

## 2023-04-17 DIAGNOSIS — I5033 Acute on chronic diastolic (congestive) heart failure: Secondary | ICD-10-CM | POA: Diagnosis not present

## 2023-04-17 DIAGNOSIS — Z9049 Acquired absence of other specified parts of digestive tract: Secondary | ICD-10-CM | POA: Diagnosis not present

## 2023-04-17 DIAGNOSIS — E1165 Type 2 diabetes mellitus with hyperglycemia: Secondary | ICD-10-CM | POA: Diagnosis not present

## 2023-04-17 DIAGNOSIS — N183 Chronic kidney disease, stage 3 unspecified: Secondary | ICD-10-CM | POA: Diagnosis not present

## 2023-04-20 DIAGNOSIS — I10 Essential (primary) hypertension: Secondary | ICD-10-CM | POA: Diagnosis not present

## 2023-04-20 DIAGNOSIS — I480 Paroxysmal atrial fibrillation: Secondary | ICD-10-CM | POA: Diagnosis not present

## 2023-04-20 DIAGNOSIS — R55 Syncope and collapse: Secondary | ICD-10-CM | POA: Diagnosis not present

## 2023-04-20 DIAGNOSIS — I2510 Atherosclerotic heart disease of native coronary artery without angina pectoris: Secondary | ICD-10-CM | POA: Diagnosis not present

## 2023-04-22 ENCOUNTER — Telehealth: Payer: Self-pay | Admitting: Family

## 2023-04-22 ENCOUNTER — Ambulatory Visit: Payer: Self-pay | Admitting: Family

## 2023-04-22 DIAGNOSIS — I5033 Acute on chronic diastolic (congestive) heart failure: Secondary | ICD-10-CM | POA: Diagnosis not present

## 2023-04-22 DIAGNOSIS — E1122 Type 2 diabetes mellitus with diabetic chronic kidney disease: Secondary | ICD-10-CM | POA: Diagnosis not present

## 2023-04-22 DIAGNOSIS — J9621 Acute and chronic respiratory failure with hypoxia: Secondary | ICD-10-CM | POA: Diagnosis not present

## 2023-04-22 DIAGNOSIS — G253 Myoclonus: Secondary | ICD-10-CM | POA: Diagnosis not present

## 2023-04-22 DIAGNOSIS — J441 Chronic obstructive pulmonary disease with (acute) exacerbation: Secondary | ICD-10-CM | POA: Diagnosis not present

## 2023-04-22 DIAGNOSIS — D696 Thrombocytopenia, unspecified: Secondary | ICD-10-CM | POA: Diagnosis not present

## 2023-04-22 DIAGNOSIS — E1165 Type 2 diabetes mellitus with hyperglycemia: Secondary | ICD-10-CM | POA: Diagnosis not present

## 2023-04-22 DIAGNOSIS — G8929 Other chronic pain: Secondary | ICD-10-CM | POA: Diagnosis not present

## 2023-04-22 DIAGNOSIS — Z9889 Other specified postprocedural states: Secondary | ICD-10-CM | POA: Diagnosis not present

## 2023-04-22 DIAGNOSIS — D631 Anemia in chronic kidney disease: Secondary | ICD-10-CM | POA: Diagnosis not present

## 2023-04-22 DIAGNOSIS — M8668 Other chronic osteomyelitis, other site: Secondary | ICD-10-CM | POA: Diagnosis not present

## 2023-04-22 DIAGNOSIS — N183 Chronic kidney disease, stage 3 unspecified: Secondary | ICD-10-CM | POA: Diagnosis not present

## 2023-04-22 DIAGNOSIS — J440 Chronic obstructive pulmonary disease with acute lower respiratory infection: Secondary | ICD-10-CM | POA: Diagnosis not present

## 2023-04-22 DIAGNOSIS — J1282 Pneumonia due to coronavirus disease 2019: Secondary | ICD-10-CM | POA: Diagnosis not present

## 2023-04-22 DIAGNOSIS — Z87891 Personal history of nicotine dependence: Secondary | ICD-10-CM | POA: Diagnosis not present

## 2023-04-22 DIAGNOSIS — I2510 Atherosclerotic heart disease of native coronary artery without angina pectoris: Secondary | ICD-10-CM | POA: Diagnosis not present

## 2023-04-22 DIAGNOSIS — J9602 Acute respiratory failure with hypercapnia: Secondary | ICD-10-CM | POA: Diagnosis not present

## 2023-04-22 DIAGNOSIS — Z9049 Acquired absence of other specified parts of digestive tract: Secondary | ICD-10-CM | POA: Diagnosis not present

## 2023-04-22 DIAGNOSIS — Z85820 Personal history of malignant melanoma of skin: Secondary | ICD-10-CM | POA: Diagnosis not present

## 2023-04-22 DIAGNOSIS — I130 Hypertensive heart and chronic kidney disease with heart failure and stage 1 through stage 4 chronic kidney disease, or unspecified chronic kidney disease: Secondary | ICD-10-CM | POA: Diagnosis not present

## 2023-04-22 DIAGNOSIS — E1169 Type 2 diabetes mellitus with other specified complication: Secondary | ICD-10-CM | POA: Diagnosis not present

## 2023-04-22 DIAGNOSIS — M109 Gout, unspecified: Secondary | ICD-10-CM | POA: Diagnosis not present

## 2023-04-22 DIAGNOSIS — U071 COVID-19: Secondary | ICD-10-CM | POA: Diagnosis not present

## 2023-04-22 DIAGNOSIS — E785 Hyperlipidemia, unspecified: Secondary | ICD-10-CM | POA: Diagnosis not present

## 2023-04-22 NOTE — Telephone Encounter (Signed)
FYI  Copied from CRM [id number]. Topic: Appointments - Appointment Info/Confirmation >> Apr 22, 2023  3:47 PM Jorje Guild R wrote: When call for Virtual appointment on 04/23/23 call his wife phone which is [phone]

## 2023-04-22 NOTE — Telephone Encounter (Signed)
Copied from CRM [id number]. Topic: Clinical - Red Word Triage >> Apr 22, 2023  1:09 PM Marcus Carr wrote: Red Word that prompted transfer to Nurse Triage: Marcelino Duster with [facility], patient right side lung diminished, hard time breathing, blood pressure elevated  Chief Complaint: difficulty breathing Symptoms: difficulty breathing, was 87w/2L continuous O2 now 91,  Frequency: Monday and worsening Pertinent Negatives: Patient denies fever Disposition: [x] ED /[] Urgent Care (no appt availability in office) / [] Appointment(In office/virtual)/ []  [facility]/ [] Home Care/ [] Refused Recommended Disposition /[] [facility]/ []  Follow-up with PCP Additional Notes: home health nurse, Marcelino Duster , RN [facility] [phone] reported pt right lung diminished, hoarseness, wheezing, working hard to breathe, sleepy.  Answer Assessment - Initial Assessment Questions 1. RESPIRATORY STATUS: "Describe your breathing?" (e.g., wheezing, shortness of breath, unable to speak, severe coughing)      SOB but able to complete sentences and able without giving out of breath 2. ONSET: "When did this breathing problem begin?"      Sunday pt given cough medication and since then SOB has gotten worse 3. PATTERN "Does the difficult breathing come and go, or has it been constant since it started?"      Constant and worsening since Monday 4. SEVERITY: "How bad is your breathing?" (e.g., mild, moderate, severe)    - MILD: No SOB at rest, mild SOB with walking, speaks normally in sentences, can lie down, no retractions, pulse < 100.    - MODERATE: SOB at rest, SOB with minimal exertion and prefers to sit, cannot lie down flat, speaks in phrases, mild retractions, audible wheezing, pulse 100-120.    - SEVERE: Very SOB at rest, speaks in single words, struggling to breathe, sitting hunched forward, retractions, pulse > 120      Severe - pt always sits in the recliner  5. RECURRENT SYMPTOM:  "Have you had difficulty breathing before?" If Yes, ask: "When was the last time?" and "What happened that time?"      no 6. CARDIAC HISTORY: "Do you have any history of heart disease?" (e.g., heart attack, angina, bypass surgery, angioplasty)      N/a 7. LUNG HISTORY: "Do you have any history of lung disease?"  (e.g., pulmonary embolus, asthma, emphysema)     N/a 8. CAUSE: "What do you think is causing the breathing problem?"      N/a 9. OTHER SYMPTOMS: "Do you have any other symptoms? (e.g., dizziness, runny nose, cough, chest pain, fever)     Wheezing right lung diminished, left lung moving air 168/88-68 HR-91O2@ 2Lwas 87w/2L continuous O2 , more sleepy today, hoarseness, working harder to breath at  10. O2 SATURATION MONITOR:  "Do you use an oxygen saturation monitor (pulse oximeter) at home?" If Yes, ask: "What is your reading (oxygen level) today?" "What is your usual oxygen saturation reading?" (e.g., 95%)         11 . PREGNANCY: "Is there any chance you are pregnant?" "When was your last menstrual period?"       N/a 12. TRAVEL: "Have you traveled out of the country in the last month?" (e.g., travel history, exposures)       N/a  Protocols used: Breathing Difficulty-A-AH

## 2023-04-22 NOTE — Telephone Encounter (Signed)
Reason for Disposition . [1] MODERATE difficulty breathing (e.g., speaks in phrases, SOB even at rest, pulse 100-120) AND [2] NEW-onset or WORSE than normal  Protocols used: Breathing Difficulty-A-AH

## 2023-04-23 ENCOUNTER — Encounter: Payer: Self-pay | Admitting: Family

## 2023-04-23 ENCOUNTER — Telehealth (INDEPENDENT_AMBULATORY_CARE_PROVIDER_SITE_OTHER): Payer: Medicare Other | Admitting: Family

## 2023-04-23 ENCOUNTER — Other Ambulatory Visit: Payer: Self-pay | Admitting: Family

## 2023-04-23 DIAGNOSIS — J208 Acute bronchitis due to other specified organisms: Secondary | ICD-10-CM

## 2023-04-23 DIAGNOSIS — R2243 Localized swelling, mass and lump, lower limb, bilateral: Secondary | ICD-10-CM

## 2023-04-23 DIAGNOSIS — E1142 Type 2 diabetes mellitus with diabetic polyneuropathy: Secondary | ICD-10-CM

## 2023-04-23 DIAGNOSIS — J441 Chronic obstructive pulmonary disease with (acute) exacerbation: Secondary | ICD-10-CM | POA: Diagnosis not present

## 2023-04-23 DIAGNOSIS — B9689 Other specified bacterial agents as the cause of diseases classified elsewhere: Secondary | ICD-10-CM | POA: Diagnosis not present

## 2023-04-23 DIAGNOSIS — Z9981 Dependence on supplemental oxygen: Secondary | ICD-10-CM

## 2023-04-23 DIAGNOSIS — I10 Essential (primary) hypertension: Secondary | ICD-10-CM

## 2023-04-23 DIAGNOSIS — J9611 Chronic respiratory failure with hypoxia: Secondary | ICD-10-CM | POA: Diagnosis not present

## 2023-04-23 MED ORDER — DOXYCYCLINE HYCLATE 100 MG PO TABS: 100.0000 mg | ORAL_TABLET | Freq: Two times a day (BID) | ORAL | 0 refills | Status: DC

## 2023-04-23 MED ORDER — PREDNISONE 20 MG PO TABS: 40.0000 mg | ORAL_TABLET | Freq: Every day | ORAL | 0 refills | Status: AC

## 2023-04-23 NOTE — Telephone Encounter (Signed)
Reason for Disposition . SEVERE difficulty breathing (e.g., struggling for each breath, speaks in single words)  Protocols used: Breathing Difficulty-A-AH

## 2023-04-23 NOTE — Progress Notes (Signed)
Virtual Visit Consent   Marcus Carr, you are scheduled for a virtual visit with a [facility] provider today. Just as with appointments in the office, your consent must be obtained to participate. Your consent will be active for this visit and any virtual visit you may have with one of our providers in the next 365 days. If you have a MyChart account, a copy of this consent can be sent to you electronically.  As this is a virtual visit, video technology does not allow for your provider to perform a traditional examination. This may limit your provider's ability to fully assess your condition. If your provider identifies any concerns that need to be evaluated in person or the need to arrange testing (such as labs, EKG, etc.), we will make arrangements to do so. Although advances in technology are sophisticated, we cannot ensure that it will always work on either your end or our end. If the connection with a video visit is poor, the visit may have to be switched to a telephone visit. With either a video or telephone visit, we are not always able to ensure that we have a secure connection.  By engaging in this virtual visit, you consent to the provision of healthcare and authorize for your insurance to be billed (if applicable) for the services provided during this visit. Depending on your insurance coverage, you may receive a charge related to this service.  I need to obtain your verbal consent now. Are you willing to proceed with your visit today? Marcus Carr has provided verbal consent on 04/23/2023 for a virtual visit (video or telephone). Marcus Rodney, FNP  Date: 04/23/2023 10:31 AM  Virtual Visit via Video Note   I, Marcus Carr, connected with  Marcus Carr  ([id number], [birth year]) on 04/23/23 at 10:10 AM EST by a video-enabled telemedicine application and verified that I am speaking with the correct person using two identifiers.  Location: Patient: Virtual Visit Location  Patient: Home Provider: Virtual Visit Location Provider: Home Office   I discussed the limitations of evaluation and management by telemedicine and the availability of in person appointments. The patient expressed understanding and agreed to proceed.    History of Present Illness: Marcus Carr is a 83 y.o. who identifies as a male who was assigned male at birth, and is being seen today for SOB and cough that started earlier in the week.  He has COPD and uses 2L of O2.   HPI: Cough This is a new problem. The current episode started in the past 7 days. The problem has been gradually worsening. The problem occurs every few minutes. The cough is Productive of sputum. Associated symptoms include nasal congestion, postnasal drip, rhinorrhea, shortness of breath and wheezing. Pertinent negatives include no chills, ear congestion, ear pain, fever, headaches, myalgias or sore throat. He has tried rest for the symptoms. The treatment provided mild relief.    Problems:  Patient Active Problem List   Diagnosis Date Noted   Chronic hypoxic respiratory failure, on home oxygen therapy (HCC) 11/06/2022   Prediabetes 05/23/2022   COPD with acute exacerbation (HCC) 11/20/2021   Acute renal failure superimposed on stage 3b chronic kidney disease (HCC) 11/20/2021   Generalized weakness 11/20/2021   Acute anemia 11/20/2021   Chronic diastolic CHF (congestive heart failure) (HCC) 11/20/2021   Allergic rhinitis 11/20/2021   Osteoarthritis of left knee 09/25/2021   Leg swelling 04/08/2021   Degeneration of lumbar intervertebral disc 12/04/2020   Metabolic syndrome  10/24/2015   Abscess, jaw    Osteomyelitis of mandible 11/07/2014   Hypertension 11/07/2014   Gout 11/07/2014   Chronic kidney disease 11/07/2014   Hyperlipidemia 11/07/2014   Morbid obesity (HCC) 11/07/2014   Back pain 11/07/2014   Anxiety 11/07/2014   Chronic mastoiditis of both sides 11/07/2014   Thyroid nodule 11/07/2014   Hx of  smoking 11/07/2014   Poor dentition 11/07/2014    Allergies:  Allergies  Allergen Reactions   Penicillins Rash    Other reaction(s): Not available  Did it involve swelling of the face/tongue/throat, SOB, or low BP? No Did it involve sudden or severe rash/hives, skin peeling, or any reaction on the inside of your mouth or nose? Yes Did you need to seek medical attention at a hospital or doctor's office? No When did it last happen?    Over 30 years   If all above answers are "NO", may proceed with cephalosporin use.     Medications:  Current Outpatient Medications:    doxycycline (VIBRA-TABS) 100 MG tablet, Take 1 tablet (100 mg total) by mouth 2 (two) times daily., Disp: 20 tablet, Rfl: 0   predniSONE (DELTASONE) 20 MG tablet, Take 2 tablets (40 mg total) by mouth daily with breakfast for 5 days., Disp: 10 tablet, Rfl: 0   Accu-Chek Softclix Lancets lancets, USE UP TO 4 TIMES DAILY AS DIRECTED (E10.9, E11.9), Disp: 100 each, Rfl: 9   albuterol (VENTOLIN HFA) 108 (90 Base) MCG/ACT inhaler, INHALE 2 PUFFS INTO THE LUNGS EVERY 6 HOURS AS NEEDED FOR WHEEZE OR SHORTNESS OF BREATH, Disp: 8.5 each, Rfl: 2   Albuterol Sulfate (PROAIR RESPICLICK) 108 (90 Base) MCG/ACT AEPB, Inhale 1-2 puffs into the lungs every 6 (six) weeks. prn, Disp: 1 each, Rfl: 1   aspirin EC 81 MG tablet, Take 81 mg by mouth daily. Swallow whole., Disp: , Rfl:    atorvastatin (LIPITOR) 20 MG tablet, TAKE 1 TABLET BY MOUTH EVERY DAY, Disp: 90 tablet, Rfl: 2   blood glucose meter kit and supplies KIT, Dispense based on patient and insurance preference. Use up to four times daily as directed., Disp: 1 each, Rfl: 0   blood glucose meter kit and supplies, Dispense based on patient and insurance preference. Use up to four times daily as directed. (FOR ICD-10 E10.9, E11.9)., Disp: 1 each, Rfl: 0   cetirizine (ZYRTEC) 10 MG tablet, Take 1 tablet (10 mg total) by mouth daily., Disp: 30 tablet, Rfl: 11   cholecalciferol (VITAMIN D)  400 UNITS TABS tablet, Take 800 Units by mouth daily., Disp: , Rfl:    citalopram (CELEXA) 20 MG tablet, TAKE 1 TABLET BY MOUTH EVERY DAY, Disp: 90 tablet, Rfl: 0   diclofenac sodium (VOLTAREN) 1 % GEL, Apply 4 g topically 4 (four) times daily. To left knee for pain, Disp: 200 g, Rfl: 1   fish oil-omega-3 fatty acids 1000 MG capsule, Take 1 g by mouth daily., Disp: , Rfl:    fluticasone (FLONASE) 50 MCG/ACT nasal spray, SPRAY 2 SPRAYS INTO EACH NOSTRIL EVERY DAY, Disp: 48 mL, Rfl: 1   furosemide (LASIX) 20 MG tablet, TAKE 2 TABLETS (40 MG TOTAL) BY MOUTH 2 (TWO) TIMES DAILY. (WITH BREAKFAST AND LUNCH), Disp: 360 tablet, Rfl: 0   gabapentin (NEURONTIN) 100 MG capsule, TAKE 1 CAPSULE (100 MG TOTAL) BY MOUTH THREE TIMES DAILY., Disp: 90 capsule, Rfl: 1   glimepiride (AMARYL) 1 MG tablet, TAKE 1 TABLET (1 MG TOTAL) BY MOUTH DAILY WITH BREAKFAST. DX COE [id number], Disp:  90 tablet, Rfl: 0   glucose blood (ACCU-CHEK GUIDE) test strip, Test BS up to 4 times daily Dx E11.42, Disp: 400 strip, Rfl: 3   indomethacin (INDOCIN) 50 MG capsule, TAKE 1 CAPSULE BY MOUTH THREE TIMES A DAY AS NEEDED, Disp: 60 capsule, Rfl: 2   oxyCODONE-acetaminophen (PERCOCET) 10-325 MG per tablet, Take 1 tablet by mouth every 12 (twelve) hours as needed. (Patient taking differently: Take 1 tablet by mouth every 12 (twelve) hours as needed for pain.), Disp: 90 tablet, Rfl: 0   potassium chloride (KLOR-CON M) 10 MEQ tablet, TAKE 1 TABLET BY MOUTH 2 TIMES DAILY., Disp: 180 tablet, Rfl: 0   Specialty Vitamins Products (MAGNESIUM, AMINO ACID CHELATE,) 133 MG tablet, Take 1 tablet by mouth daily., Disp: , Rfl:    verapamil (CALAN-SR) 120 MG CR tablet, TAKE 1 TABLET BY MOUTH AT  BEDTIME, Disp: 100 tablet, Rfl: 0  Observations/Objective: Patient is well-developed, well-nourished in no acute distress.  Resting comfortably  at home.  Head is normocephalic, atraumatic.  No labored breathing.  Speech is clear and coherent with logical content.   Patient is alert and oriented at baseline.  Sitting wheelchair, dry nonproductive cough  Assessment and Plan: 1. Acute bacterial bronchitis (Primary) - doxycycline (VIBRA-TABS) 100 MG tablet; Take 1 tablet (100 mg total) by mouth 2 (two) times daily.  Dispense: 20 tablet; Refill: 0 - predniSONE (DELTASONE) 20 MG tablet; Take 2 tablets (40 mg total) by mouth daily with breakfast for 5 days.  Dispense: 10 tablet; Refill: 0  2. COPD with acute exacerbation (HCC) - doxycycline (VIBRA-TABS) 100 MG tablet; Take 1 tablet (100 mg total) by mouth 2 (two) times daily.  Dispense: 20 tablet; Refill: 0 - predniSONE (DELTASONE) 20 MG tablet; Take 2 tablets (40 mg total) by mouth daily with breakfast for 5 days.  Dispense: 10 tablet; Refill: 0  3. Chronic hypoxic respiratory failure, on home oxygen therapy (HCC)  - Take meds as prescribed - Use a cool mist humidifier  -Use saline nose sprays frequently -Force fluids -For any cough or congestion  Use plain Mucinex- regular strength or max strength is fine -For fever or aces or pains- take tylenol or ibuprofen. -Throat lozenges if help -Follow up if symptoms worsen or do not improve   Follow Up Instructions: I discussed the assessment and treatment plan with the patient. The patient was provided an opportunity to ask questions and all were answered. The patient agreed with the plan and demonstrated an understanding of the instructions.  A copy of instructions were sent to the patient via MyChart unless otherwise noted below.     The patient was advised to call back or seek an in-person evaluation if the symptoms worsen or if the condition fails to improve as anticipated.    Marcus Rodney, FNP

## 2023-04-23 NOTE — Telephone Encounter (Signed)
Letter sent.

## 2023-04-23 NOTE — Telephone Encounter (Signed)
Christy NTBS in Feb for 3 mos FU RF sent to pharmacy

## 2023-04-24 NOTE — Telephone Encounter (Signed)
Agree with plan to go to ED.

## 2023-04-27 DIAGNOSIS — G4733 Obstructive sleep apnea (adult) (pediatric): Secondary | ICD-10-CM | POA: Diagnosis not present

## 2023-04-28 DIAGNOSIS — G8929 Other chronic pain: Secondary | ICD-10-CM | POA: Diagnosis not present

## 2023-04-28 DIAGNOSIS — E785 Hyperlipidemia, unspecified: Secondary | ICD-10-CM | POA: Diagnosis not present

## 2023-04-28 DIAGNOSIS — Z9981 Dependence on supplemental oxygen: Secondary | ICD-10-CM | POA: Diagnosis not present

## 2023-04-28 DIAGNOSIS — E1169 Type 2 diabetes mellitus with other specified complication: Secondary | ICD-10-CM | POA: Diagnosis not present

## 2023-04-28 DIAGNOSIS — M109 Gout, unspecified: Secondary | ICD-10-CM | POA: Diagnosis not present

## 2023-04-28 DIAGNOSIS — E1122 Type 2 diabetes mellitus with diabetic chronic kidney disease: Secondary | ICD-10-CM | POA: Diagnosis not present

## 2023-04-28 DIAGNOSIS — M8668 Other chronic osteomyelitis, other site: Secondary | ICD-10-CM | POA: Diagnosis not present

## 2023-04-28 DIAGNOSIS — E1165 Type 2 diabetes mellitus with hyperglycemia: Secondary | ICD-10-CM | POA: Diagnosis not present

## 2023-04-28 DIAGNOSIS — I130 Hypertensive heart and chronic kidney disease with heart failure and stage 1 through stage 4 chronic kidney disease, or unspecified chronic kidney disease: Secondary | ICD-10-CM | POA: Diagnosis not present

## 2023-04-28 DIAGNOSIS — I5042 Chronic combined systolic (congestive) and diastolic (congestive) heart failure: Secondary | ICD-10-CM | POA: Diagnosis not present

## 2023-04-28 DIAGNOSIS — I2510 Atherosclerotic heart disease of native coronary artery without angina pectoris: Secondary | ICD-10-CM | POA: Diagnosis not present

## 2023-04-28 DIAGNOSIS — Z556 Problems related to health literacy: Secondary | ICD-10-CM | POA: Diagnosis not present

## 2023-04-28 DIAGNOSIS — J9612 Chronic respiratory failure with hypercapnia: Secondary | ICD-10-CM | POA: Diagnosis not present

## 2023-04-28 DIAGNOSIS — Z9889 Other specified postprocedural states: Secondary | ICD-10-CM | POA: Diagnosis not present

## 2023-04-28 DIAGNOSIS — N183 Chronic kidney disease, stage 3 unspecified: Secondary | ICD-10-CM | POA: Diagnosis not present

## 2023-04-28 DIAGNOSIS — G894 Chronic pain syndrome: Secondary | ICD-10-CM | POA: Diagnosis not present

## 2023-04-28 DIAGNOSIS — J449 Chronic obstructive pulmonary disease, unspecified: Secondary | ICD-10-CM | POA: Diagnosis not present

## 2023-04-28 DIAGNOSIS — D631 Anemia in chronic kidney disease: Secondary | ICD-10-CM | POA: Diagnosis not present

## 2023-04-28 DIAGNOSIS — B351 Tinea unguium: Secondary | ICD-10-CM | POA: Diagnosis not present

## 2023-04-28 DIAGNOSIS — Z85820 Personal history of malignant melanoma of skin: Secondary | ICD-10-CM | POA: Diagnosis not present

## 2023-04-28 DIAGNOSIS — Z87891 Personal history of nicotine dependence: Secondary | ICD-10-CM | POA: Diagnosis not present

## 2023-04-28 DIAGNOSIS — L03031 Cellulitis of right toe: Secondary | ICD-10-CM | POA: Diagnosis not present

## 2023-04-28 DIAGNOSIS — G253 Myoclonus: Secondary | ICD-10-CM | POA: Diagnosis not present

## 2023-04-28 DIAGNOSIS — D696 Thrombocytopenia, unspecified: Secondary | ICD-10-CM | POA: Diagnosis not present

## 2023-04-28 DIAGNOSIS — Z7982 Long term (current) use of aspirin: Secondary | ICD-10-CM | POA: Diagnosis not present

## 2023-04-28 DIAGNOSIS — Z9049 Acquired absence of other specified parts of digestive tract: Secondary | ICD-10-CM | POA: Diagnosis not present

## 2023-04-28 DIAGNOSIS — Z7984 Long term (current) use of oral hypoglycemic drugs: Secondary | ICD-10-CM | POA: Diagnosis not present

## 2023-05-05 DIAGNOSIS — Z79899 Other long term (current) drug therapy: Secondary | ICD-10-CM | POA: Diagnosis not present

## 2023-05-05 DIAGNOSIS — M5416 Radiculopathy, lumbar region: Secondary | ICD-10-CM | POA: Diagnosis not present

## 2023-05-05 DIAGNOSIS — Z5181 Encounter for therapeutic drug level monitoring: Secondary | ICD-10-CM | POA: Diagnosis not present

## 2023-05-05 DIAGNOSIS — G894 Chronic pain syndrome: Secondary | ICD-10-CM | POA: Diagnosis not present

## 2023-05-05 DIAGNOSIS — Z79891 Long term (current) use of opiate analgesic: Secondary | ICD-10-CM | POA: Diagnosis not present

## 2023-05-05 DIAGNOSIS — M51369 Other intervertebral disc degeneration, lumbar region without mention of lumbar back pain or lower extremity pain: Secondary | ICD-10-CM | POA: Diagnosis not present

## 2023-05-06 DIAGNOSIS — I480 Paroxysmal atrial fibrillation: Secondary | ICD-10-CM | POA: Diagnosis not present

## 2023-05-07 DIAGNOSIS — Z9981 Dependence on supplemental oxygen: Secondary | ICD-10-CM | POA: Diagnosis not present

## 2023-05-07 DIAGNOSIS — M8668 Other chronic osteomyelitis, other site: Secondary | ICD-10-CM | POA: Diagnosis not present

## 2023-05-07 DIAGNOSIS — G253 Myoclonus: Secondary | ICD-10-CM | POA: Diagnosis not present

## 2023-05-07 DIAGNOSIS — E785 Hyperlipidemia, unspecified: Secondary | ICD-10-CM | POA: Diagnosis not present

## 2023-05-07 DIAGNOSIS — Z85820 Personal history of malignant melanoma of skin: Secondary | ICD-10-CM | POA: Diagnosis not present

## 2023-05-07 DIAGNOSIS — G8929 Other chronic pain: Secondary | ICD-10-CM | POA: Diagnosis not present

## 2023-05-07 DIAGNOSIS — N183 Chronic kidney disease, stage 3 unspecified: Secondary | ICD-10-CM | POA: Diagnosis not present

## 2023-05-07 DIAGNOSIS — Z9049 Acquired absence of other specified parts of digestive tract: Secondary | ICD-10-CM | POA: Diagnosis not present

## 2023-05-07 DIAGNOSIS — M109 Gout, unspecified: Secondary | ICD-10-CM | POA: Diagnosis not present

## 2023-05-07 DIAGNOSIS — I5042 Chronic combined systolic (congestive) and diastolic (congestive) heart failure: Secondary | ICD-10-CM | POA: Diagnosis not present

## 2023-05-07 DIAGNOSIS — D631 Anemia in chronic kidney disease: Secondary | ICD-10-CM | POA: Diagnosis not present

## 2023-05-07 DIAGNOSIS — J449 Chronic obstructive pulmonary disease, unspecified: Secondary | ICD-10-CM | POA: Diagnosis not present

## 2023-05-07 DIAGNOSIS — Z7982 Long term (current) use of aspirin: Secondary | ICD-10-CM | POA: Diagnosis not present

## 2023-05-07 DIAGNOSIS — Z7984 Long term (current) use of oral hypoglycemic drugs: Secondary | ICD-10-CM | POA: Diagnosis not present

## 2023-05-07 DIAGNOSIS — E1165 Type 2 diabetes mellitus with hyperglycemia: Secondary | ICD-10-CM | POA: Diagnosis not present

## 2023-05-07 DIAGNOSIS — Z87891 Personal history of nicotine dependence: Secondary | ICD-10-CM | POA: Diagnosis not present

## 2023-05-07 DIAGNOSIS — I2510 Atherosclerotic heart disease of native coronary artery without angina pectoris: Secondary | ICD-10-CM | POA: Diagnosis not present

## 2023-05-07 DIAGNOSIS — J9612 Chronic respiratory failure with hypercapnia: Secondary | ICD-10-CM | POA: Diagnosis not present

## 2023-05-07 DIAGNOSIS — Z556 Problems related to health literacy: Secondary | ICD-10-CM | POA: Diagnosis not present

## 2023-05-07 DIAGNOSIS — D696 Thrombocytopenia, unspecified: Secondary | ICD-10-CM | POA: Diagnosis not present

## 2023-05-07 DIAGNOSIS — E1169 Type 2 diabetes mellitus with other specified complication: Secondary | ICD-10-CM | POA: Diagnosis not present

## 2023-05-07 DIAGNOSIS — I130 Hypertensive heart and chronic kidney disease with heart failure and stage 1 through stage 4 chronic kidney disease, or unspecified chronic kidney disease: Secondary | ICD-10-CM | POA: Diagnosis not present

## 2023-05-07 DIAGNOSIS — Z9889 Other specified postprocedural states: Secondary | ICD-10-CM | POA: Diagnosis not present

## 2023-05-07 DIAGNOSIS — E1122 Type 2 diabetes mellitus with diabetic chronic kidney disease: Secondary | ICD-10-CM | POA: Diagnosis not present

## 2023-05-11 DIAGNOSIS — Z85828 Personal history of other malignant neoplasm of skin: Secondary | ICD-10-CM | POA: Diagnosis not present

## 2023-05-11 DIAGNOSIS — Z1152 Encounter for screening for COVID-19: Secondary | ICD-10-CM | POA: Diagnosis not present

## 2023-05-11 DIAGNOSIS — R2689 Other abnormalities of gait and mobility: Secondary | ICD-10-CM | POA: Diagnosis not present

## 2023-05-11 DIAGNOSIS — R918 Other nonspecific abnormal finding of lung field: Secondary | ICD-10-CM | POA: Diagnosis not present

## 2023-05-11 DIAGNOSIS — Z95818 Presence of other cardiac implants and grafts: Secondary | ICD-10-CM | POA: Diagnosis not present

## 2023-05-11 DIAGNOSIS — Z7984 Long term (current) use of oral hypoglycemic drugs: Secondary | ICD-10-CM | POA: Diagnosis not present

## 2023-05-11 DIAGNOSIS — N179 Acute kidney failure, unspecified: Secondary | ICD-10-CM | POA: Diagnosis not present

## 2023-05-11 DIAGNOSIS — G9341 Metabolic encephalopathy: Secondary | ICD-10-CM | POA: Diagnosis not present

## 2023-05-11 DIAGNOSIS — N183 Chronic kidney disease, stage 3 unspecified: Secondary | ICD-10-CM | POA: Diagnosis not present

## 2023-05-11 DIAGNOSIS — L03116 Cellulitis of left lower limb: Secondary | ICD-10-CM | POA: Diagnosis not present

## 2023-05-11 DIAGNOSIS — G253 Myoclonus: Secondary | ICD-10-CM | POA: Diagnosis not present

## 2023-05-11 DIAGNOSIS — E8729 Other acidosis: Secondary | ICD-10-CM | POA: Diagnosis not present

## 2023-05-11 DIAGNOSIS — S59902A Unspecified injury of left elbow, initial encounter: Secondary | ICD-10-CM | POA: Diagnosis not present

## 2023-05-11 DIAGNOSIS — D696 Thrombocytopenia, unspecified: Secondary | ICD-10-CM | POA: Diagnosis not present

## 2023-05-11 DIAGNOSIS — R296 Repeated falls: Secondary | ICD-10-CM | POA: Diagnosis not present

## 2023-05-11 DIAGNOSIS — I10 Essential (primary) hypertension: Secondary | ICD-10-CM | POA: Diagnosis not present

## 2023-05-11 DIAGNOSIS — E1122 Type 2 diabetes mellitus with diabetic chronic kidney disease: Secondary | ICD-10-CM | POA: Diagnosis not present

## 2023-05-11 DIAGNOSIS — Z043 Encounter for examination and observation following other accident: Secondary | ICD-10-CM | POA: Diagnosis not present

## 2023-05-11 DIAGNOSIS — I2510 Atherosclerotic heart disease of native coronary artery without angina pectoris: Secondary | ICD-10-CM | POA: Diagnosis not present

## 2023-05-11 DIAGNOSIS — E873 Alkalosis: Secondary | ICD-10-CM | POA: Diagnosis not present

## 2023-05-11 DIAGNOSIS — I517 Cardiomegaly: Secondary | ICD-10-CM | POA: Diagnosis not present

## 2023-05-11 DIAGNOSIS — Z9181 History of falling: Secondary | ICD-10-CM | POA: Diagnosis not present

## 2023-05-11 DIAGNOSIS — I5033 Acute on chronic diastolic (congestive) heart failure: Secondary | ICD-10-CM | POA: Diagnosis not present

## 2023-05-11 DIAGNOSIS — N1831 Chronic kidney disease, stage 3a: Secondary | ICD-10-CM | POA: Diagnosis not present

## 2023-05-11 DIAGNOSIS — W1830XA Fall on same level, unspecified, initial encounter: Secondary | ICD-10-CM | POA: Diagnosis not present

## 2023-05-11 DIAGNOSIS — M79605 Pain in left leg: Secondary | ICD-10-CM | POA: Diagnosis not present

## 2023-05-11 DIAGNOSIS — R531 Weakness: Secondary | ICD-10-CM | POA: Diagnosis not present

## 2023-05-11 DIAGNOSIS — Z7982 Long term (current) use of aspirin: Secondary | ICD-10-CM | POA: Diagnosis not present

## 2023-05-11 DIAGNOSIS — I509 Heart failure, unspecified: Secondary | ICD-10-CM | POA: Diagnosis not present

## 2023-05-11 DIAGNOSIS — G4733 Obstructive sleep apnea (adult) (pediatric): Secondary | ICD-10-CM | POA: Diagnosis not present

## 2023-05-11 DIAGNOSIS — D539 Nutritional anemia, unspecified: Secondary | ICD-10-CM | POA: Diagnosis not present

## 2023-05-11 DIAGNOSIS — I130 Hypertensive heart and chronic kidney disease with heart failure and stage 1 through stage 4 chronic kidney disease, or unspecified chronic kidney disease: Secondary | ICD-10-CM | POA: Diagnosis not present

## 2023-05-11 DIAGNOSIS — G9340 Encephalopathy, unspecified: Secondary | ICD-10-CM | POA: Diagnosis not present

## 2023-05-11 DIAGNOSIS — S51011A Laceration without foreign body of right elbow, initial encounter: Secondary | ICD-10-CM | POA: Diagnosis not present

## 2023-05-11 DIAGNOSIS — D631 Anemia in chronic kidney disease: Secondary | ICD-10-CM | POA: Diagnosis not present

## 2023-05-11 DIAGNOSIS — E1165 Type 2 diabetes mellitus with hyperglycemia: Secondary | ICD-10-CM | POA: Diagnosis not present

## 2023-05-11 DIAGNOSIS — R600 Localized edema: Secondary | ICD-10-CM | POA: Diagnosis not present

## 2023-05-12 DIAGNOSIS — S51011A Laceration without foreign body of right elbow, initial encounter: Secondary | ICD-10-CM | POA: Diagnosis not present

## 2023-05-12 DIAGNOSIS — E1165 Type 2 diabetes mellitus with hyperglycemia: Secondary | ICD-10-CM | POA: Diagnosis not present

## 2023-05-12 DIAGNOSIS — E873 Alkalosis: Secondary | ICD-10-CM | POA: Diagnosis not present

## 2023-05-12 DIAGNOSIS — Z9181 History of falling: Secondary | ICD-10-CM | POA: Diagnosis not present

## 2023-05-12 DIAGNOSIS — D631 Anemia in chronic kidney disease: Secondary | ICD-10-CM | POA: Diagnosis not present

## 2023-05-12 DIAGNOSIS — E8729 Other acidosis: Secondary | ICD-10-CM | POA: Diagnosis not present

## 2023-05-12 DIAGNOSIS — L03116 Cellulitis of left lower limb: Secondary | ICD-10-CM | POA: Diagnosis not present

## 2023-05-12 DIAGNOSIS — Z1152 Encounter for screening for COVID-19: Secondary | ICD-10-CM | POA: Diagnosis not present

## 2023-05-12 DIAGNOSIS — R531 Weakness: Secondary | ICD-10-CM | POA: Diagnosis not present

## 2023-05-12 DIAGNOSIS — R259 Unspecified abnormal involuntary movements: Secondary | ICD-10-CM | POA: Diagnosis not present

## 2023-05-12 DIAGNOSIS — G4733 Obstructive sleep apnea (adult) (pediatric): Secondary | ICD-10-CM | POA: Diagnosis not present

## 2023-05-12 DIAGNOSIS — R600 Localized edema: Secondary | ICD-10-CM | POA: Diagnosis not present

## 2023-05-12 DIAGNOSIS — I5033 Acute on chronic diastolic (congestive) heart failure: Secondary | ICD-10-CM | POA: Diagnosis not present

## 2023-05-12 DIAGNOSIS — R296 Repeated falls: Secondary | ICD-10-CM | POA: Diagnosis not present

## 2023-05-12 DIAGNOSIS — I130 Hypertensive heart and chronic kidney disease with heart failure and stage 1 through stage 4 chronic kidney disease, or unspecified chronic kidney disease: Secondary | ICD-10-CM | POA: Diagnosis not present

## 2023-05-12 DIAGNOSIS — G9341 Metabolic encephalopathy: Secondary | ICD-10-CM | POA: Diagnosis not present

## 2023-05-12 DIAGNOSIS — N183 Chronic kidney disease, stage 3 unspecified: Secondary | ICD-10-CM | POA: Diagnosis not present

## 2023-05-12 DIAGNOSIS — Z7984 Long term (current) use of oral hypoglycemic drugs: Secondary | ICD-10-CM | POA: Diagnosis not present

## 2023-05-12 DIAGNOSIS — N179 Acute kidney failure, unspecified: Secondary | ICD-10-CM | POA: Diagnosis not present

## 2023-05-12 DIAGNOSIS — R2689 Other abnormalities of gait and mobility: Secondary | ICD-10-CM | POA: Diagnosis not present

## 2023-05-12 DIAGNOSIS — G9340 Encephalopathy, unspecified: Secondary | ICD-10-CM | POA: Diagnosis not present

## 2023-05-12 DIAGNOSIS — D539 Nutritional anemia, unspecified: Secondary | ICD-10-CM | POA: Diagnosis not present

## 2023-05-12 DIAGNOSIS — Z95818 Presence of other cardiac implants and grafts: Secondary | ICD-10-CM | POA: Diagnosis not present

## 2023-05-12 DIAGNOSIS — G253 Myoclonus: Secondary | ICD-10-CM | POA: Diagnosis not present

## 2023-05-12 DIAGNOSIS — E1122 Type 2 diabetes mellitus with diabetic chronic kidney disease: Secondary | ICD-10-CM | POA: Diagnosis not present

## 2023-05-13 DIAGNOSIS — R531 Weakness: Secondary | ICD-10-CM | POA: Diagnosis not present

## 2023-05-13 DIAGNOSIS — G9341 Metabolic encephalopathy: Secondary | ICD-10-CM | POA: Diagnosis not present

## 2023-05-13 DIAGNOSIS — G9340 Encephalopathy, unspecified: Secondary | ICD-10-CM | POA: Diagnosis not present

## 2023-05-13 DIAGNOSIS — R600 Localized edema: Secondary | ICD-10-CM | POA: Diagnosis not present

## 2023-05-13 DIAGNOSIS — L03116 Cellulitis of left lower limb: Secondary | ICD-10-CM | POA: Diagnosis not present

## 2023-05-13 DIAGNOSIS — N179 Acute kidney failure, unspecified: Secondary | ICD-10-CM | POA: Diagnosis not present

## 2023-05-13 DIAGNOSIS — S51011A Laceration without foreign body of right elbow, initial encounter: Secondary | ICD-10-CM | POA: Diagnosis not present

## 2023-05-13 DIAGNOSIS — R2689 Other abnormalities of gait and mobility: Secondary | ICD-10-CM | POA: Diagnosis not present

## 2023-05-13 DIAGNOSIS — G253 Myoclonus: Secondary | ICD-10-CM | POA: Diagnosis not present

## 2023-05-13 DIAGNOSIS — E1122 Type 2 diabetes mellitus with diabetic chronic kidney disease: Secondary | ICD-10-CM | POA: Diagnosis not present

## 2023-05-13 DIAGNOSIS — N183 Chronic kidney disease, stage 3 unspecified: Secondary | ICD-10-CM | POA: Diagnosis not present

## 2023-05-13 DIAGNOSIS — E1165 Type 2 diabetes mellitus with hyperglycemia: Secondary | ICD-10-CM | POA: Diagnosis not present

## 2023-05-13 DIAGNOSIS — E8729 Other acidosis: Secondary | ICD-10-CM | POA: Diagnosis not present

## 2023-05-13 DIAGNOSIS — G4733 Obstructive sleep apnea (adult) (pediatric): Secondary | ICD-10-CM | POA: Diagnosis not present

## 2023-05-13 DIAGNOSIS — Z95818 Presence of other cardiac implants and grafts: Secondary | ICD-10-CM | POA: Diagnosis not present

## 2023-05-13 DIAGNOSIS — I5033 Acute on chronic diastolic (congestive) heart failure: Secondary | ICD-10-CM | POA: Diagnosis not present

## 2023-05-13 DIAGNOSIS — D631 Anemia in chronic kidney disease: Secondary | ICD-10-CM | POA: Diagnosis not present

## 2023-05-13 DIAGNOSIS — Z1152 Encounter for screening for COVID-19: Secondary | ICD-10-CM | POA: Diagnosis not present

## 2023-05-13 DIAGNOSIS — R296 Repeated falls: Secondary | ICD-10-CM | POA: Diagnosis not present

## 2023-05-13 DIAGNOSIS — E873 Alkalosis: Secondary | ICD-10-CM | POA: Diagnosis not present

## 2023-05-13 DIAGNOSIS — D539 Nutritional anemia, unspecified: Secondary | ICD-10-CM | POA: Diagnosis not present

## 2023-05-13 DIAGNOSIS — Z7984 Long term (current) use of oral hypoglycemic drugs: Secondary | ICD-10-CM | POA: Diagnosis not present

## 2023-05-13 DIAGNOSIS — I130 Hypertensive heart and chronic kidney disease with heart failure and stage 1 through stage 4 chronic kidney disease, or unspecified chronic kidney disease: Secondary | ICD-10-CM | POA: Diagnosis not present

## 2023-05-13 DIAGNOSIS — R259 Unspecified abnormal involuntary movements: Secondary | ICD-10-CM | POA: Diagnosis not present

## 2023-05-13 DIAGNOSIS — Z9181 History of falling: Secondary | ICD-10-CM | POA: Diagnosis not present

## 2023-05-14 DIAGNOSIS — R600 Localized edema: Secondary | ICD-10-CM | POA: Diagnosis not present

## 2023-05-14 DIAGNOSIS — E873 Alkalosis: Secondary | ICD-10-CM | POA: Diagnosis not present

## 2023-05-14 DIAGNOSIS — S51011A Laceration without foreign body of right elbow, initial encounter: Secondary | ICD-10-CM | POA: Diagnosis not present

## 2023-05-14 DIAGNOSIS — R531 Weakness: Secondary | ICD-10-CM | POA: Diagnosis not present

## 2023-05-14 DIAGNOSIS — Z7984 Long term (current) use of oral hypoglycemic drugs: Secondary | ICD-10-CM | POA: Diagnosis not present

## 2023-05-14 DIAGNOSIS — I130 Hypertensive heart and chronic kidney disease with heart failure and stage 1 through stage 4 chronic kidney disease, or unspecified chronic kidney disease: Secondary | ICD-10-CM | POA: Diagnosis not present

## 2023-05-14 DIAGNOSIS — G9340 Encephalopathy, unspecified: Secondary | ICD-10-CM | POA: Diagnosis not present

## 2023-05-14 DIAGNOSIS — R259 Unspecified abnormal involuntary movements: Secondary | ICD-10-CM | POA: Diagnosis not present

## 2023-05-14 DIAGNOSIS — Z9181 History of falling: Secondary | ICD-10-CM | POA: Diagnosis not present

## 2023-05-14 DIAGNOSIS — D539 Nutritional anemia, unspecified: Secondary | ICD-10-CM | POA: Diagnosis not present

## 2023-05-14 DIAGNOSIS — Z1152 Encounter for screening for COVID-19: Secondary | ICD-10-CM | POA: Diagnosis not present

## 2023-05-14 DIAGNOSIS — E1122 Type 2 diabetes mellitus with diabetic chronic kidney disease: Secondary | ICD-10-CM | POA: Diagnosis not present

## 2023-05-14 DIAGNOSIS — N183 Chronic kidney disease, stage 3 unspecified: Secondary | ICD-10-CM | POA: Diagnosis not present

## 2023-05-14 DIAGNOSIS — Z95818 Presence of other cardiac implants and grafts: Secondary | ICD-10-CM | POA: Diagnosis not present

## 2023-05-14 DIAGNOSIS — G9341 Metabolic encephalopathy: Secondary | ICD-10-CM | POA: Diagnosis not present

## 2023-05-14 DIAGNOSIS — I5033 Acute on chronic diastolic (congestive) heart failure: Secondary | ICD-10-CM | POA: Diagnosis not present

## 2023-05-14 DIAGNOSIS — R2689 Other abnormalities of gait and mobility: Secondary | ICD-10-CM | POA: Diagnosis not present

## 2023-05-14 DIAGNOSIS — N179 Acute kidney failure, unspecified: Secondary | ICD-10-CM | POA: Diagnosis not present

## 2023-05-14 DIAGNOSIS — G4733 Obstructive sleep apnea (adult) (pediatric): Secondary | ICD-10-CM | POA: Diagnosis not present

## 2023-05-14 DIAGNOSIS — R296 Repeated falls: Secondary | ICD-10-CM | POA: Diagnosis not present

## 2023-05-14 DIAGNOSIS — L03116 Cellulitis of left lower limb: Secondary | ICD-10-CM | POA: Diagnosis not present

## 2023-05-14 DIAGNOSIS — G253 Myoclonus: Secondary | ICD-10-CM | POA: Diagnosis not present

## 2023-05-14 DIAGNOSIS — E8729 Other acidosis: Secondary | ICD-10-CM | POA: Diagnosis not present

## 2023-05-14 DIAGNOSIS — E1165 Type 2 diabetes mellitus with hyperglycemia: Secondary | ICD-10-CM | POA: Diagnosis not present

## 2023-05-14 DIAGNOSIS — D631 Anemia in chronic kidney disease: Secondary | ICD-10-CM | POA: Diagnosis not present

## 2023-05-15 DIAGNOSIS — D539 Nutritional anemia, unspecified: Secondary | ICD-10-CM | POA: Diagnosis not present

## 2023-05-15 DIAGNOSIS — R600 Localized edema: Secondary | ICD-10-CM | POA: Diagnosis not present

## 2023-05-15 DIAGNOSIS — G253 Myoclonus: Secondary | ICD-10-CM | POA: Diagnosis not present

## 2023-05-15 DIAGNOSIS — E1122 Type 2 diabetes mellitus with diabetic chronic kidney disease: Secondary | ICD-10-CM | POA: Diagnosis not present

## 2023-05-15 DIAGNOSIS — L03116 Cellulitis of left lower limb: Secondary | ICD-10-CM | POA: Diagnosis not present

## 2023-05-15 DIAGNOSIS — I5033 Acute on chronic diastolic (congestive) heart failure: Secondary | ICD-10-CM | POA: Diagnosis not present

## 2023-05-15 DIAGNOSIS — G9340 Encephalopathy, unspecified: Secondary | ICD-10-CM | POA: Diagnosis not present

## 2023-05-15 DIAGNOSIS — R531 Weakness: Secondary | ICD-10-CM | POA: Diagnosis not present

## 2023-05-15 DIAGNOSIS — R296 Repeated falls: Secondary | ICD-10-CM | POA: Diagnosis not present

## 2023-05-15 DIAGNOSIS — Z95818 Presence of other cardiac implants and grafts: Secondary | ICD-10-CM | POA: Diagnosis not present

## 2023-05-15 DIAGNOSIS — I130 Hypertensive heart and chronic kidney disease with heart failure and stage 1 through stage 4 chronic kidney disease, or unspecified chronic kidney disease: Secondary | ICD-10-CM | POA: Diagnosis not present

## 2023-05-15 DIAGNOSIS — S51011A Laceration without foreign body of right elbow, initial encounter: Secondary | ICD-10-CM | POA: Diagnosis not present

## 2023-05-15 DIAGNOSIS — D631 Anemia in chronic kidney disease: Secondary | ICD-10-CM | POA: Diagnosis not present

## 2023-05-15 DIAGNOSIS — E873 Alkalosis: Secondary | ICD-10-CM | POA: Diagnosis not present

## 2023-05-15 DIAGNOSIS — N183 Chronic kidney disease, stage 3 unspecified: Secondary | ICD-10-CM | POA: Diagnosis not present

## 2023-05-15 DIAGNOSIS — Z7984 Long term (current) use of oral hypoglycemic drugs: Secondary | ICD-10-CM | POA: Diagnosis not present

## 2023-05-15 DIAGNOSIS — G9341 Metabolic encephalopathy: Secondary | ICD-10-CM | POA: Diagnosis not present

## 2023-05-15 DIAGNOSIS — G4733 Obstructive sleep apnea (adult) (pediatric): Secondary | ICD-10-CM | POA: Diagnosis not present

## 2023-05-15 DIAGNOSIS — E8729 Other acidosis: Secondary | ICD-10-CM | POA: Diagnosis not present

## 2023-05-15 DIAGNOSIS — E1165 Type 2 diabetes mellitus with hyperglycemia: Secondary | ICD-10-CM | POA: Diagnosis not present

## 2023-05-15 DIAGNOSIS — N179 Acute kidney failure, unspecified: Secondary | ICD-10-CM | POA: Diagnosis not present

## 2023-05-15 DIAGNOSIS — R2689 Other abnormalities of gait and mobility: Secondary | ICD-10-CM | POA: Diagnosis not present

## 2023-05-15 DIAGNOSIS — R112 Nausea with vomiting, unspecified: Secondary | ICD-10-CM | POA: Diagnosis not present

## 2023-05-15 DIAGNOSIS — Z9181 History of falling: Secondary | ICD-10-CM | POA: Diagnosis not present

## 2023-05-15 DIAGNOSIS — Z1152 Encounter for screening for COVID-19: Secondary | ICD-10-CM | POA: Diagnosis not present

## 2023-05-15 DIAGNOSIS — R259 Unspecified abnormal involuntary movements: Secondary | ICD-10-CM | POA: Diagnosis not present

## 2023-05-16 DIAGNOSIS — E873 Alkalosis: Secondary | ICD-10-CM | POA: Diagnosis not present

## 2023-05-16 DIAGNOSIS — Z7984 Long term (current) use of oral hypoglycemic drugs: Secondary | ICD-10-CM | POA: Diagnosis not present

## 2023-05-16 DIAGNOSIS — G9340 Encephalopathy, unspecified: Secondary | ICD-10-CM | POA: Diagnosis not present

## 2023-05-16 DIAGNOSIS — Z95818 Presence of other cardiac implants and grafts: Secondary | ICD-10-CM | POA: Diagnosis not present

## 2023-05-16 DIAGNOSIS — S51011A Laceration without foreign body of right elbow, initial encounter: Secondary | ICD-10-CM | POA: Diagnosis not present

## 2023-05-16 DIAGNOSIS — E1122 Type 2 diabetes mellitus with diabetic chronic kidney disease: Secondary | ICD-10-CM | POA: Diagnosis not present

## 2023-05-16 DIAGNOSIS — E8729 Other acidosis: Secondary | ICD-10-CM | POA: Diagnosis not present

## 2023-05-16 DIAGNOSIS — E1165 Type 2 diabetes mellitus with hyperglycemia: Secondary | ICD-10-CM | POA: Diagnosis not present

## 2023-05-16 DIAGNOSIS — G4733 Obstructive sleep apnea (adult) (pediatric): Secondary | ICD-10-CM | POA: Diagnosis not present

## 2023-05-16 DIAGNOSIS — R2689 Other abnormalities of gait and mobility: Secondary | ICD-10-CM | POA: Diagnosis not present

## 2023-05-16 DIAGNOSIS — L03116 Cellulitis of left lower limb: Secondary | ICD-10-CM | POA: Diagnosis not present

## 2023-05-16 DIAGNOSIS — R296 Repeated falls: Secondary | ICD-10-CM | POA: Diagnosis not present

## 2023-05-16 DIAGNOSIS — N183 Chronic kidney disease, stage 3 unspecified: Secondary | ICD-10-CM | POA: Diagnosis not present

## 2023-05-16 DIAGNOSIS — I5033 Acute on chronic diastolic (congestive) heart failure: Secondary | ICD-10-CM | POA: Diagnosis not present

## 2023-05-16 DIAGNOSIS — I130 Hypertensive heart and chronic kidney disease with heart failure and stage 1 through stage 4 chronic kidney disease, or unspecified chronic kidney disease: Secondary | ICD-10-CM | POA: Diagnosis not present

## 2023-05-16 DIAGNOSIS — Z1152 Encounter for screening for COVID-19: Secondary | ICD-10-CM | POA: Diagnosis not present

## 2023-05-16 DIAGNOSIS — G9341 Metabolic encephalopathy: Secondary | ICD-10-CM | POA: Diagnosis not present

## 2023-05-16 DIAGNOSIS — D631 Anemia in chronic kidney disease: Secondary | ICD-10-CM | POA: Diagnosis not present

## 2023-05-16 DIAGNOSIS — Z9181 History of falling: Secondary | ICD-10-CM | POA: Diagnosis not present

## 2023-05-16 DIAGNOSIS — R259 Unspecified abnormal involuntary movements: Secondary | ICD-10-CM | POA: Diagnosis not present

## 2023-05-16 DIAGNOSIS — R600 Localized edema: Secondary | ICD-10-CM | POA: Diagnosis not present

## 2023-05-16 DIAGNOSIS — R531 Weakness: Secondary | ICD-10-CM | POA: Diagnosis not present

## 2023-05-16 DIAGNOSIS — D539 Nutritional anemia, unspecified: Secondary | ICD-10-CM | POA: Diagnosis not present

## 2023-05-16 DIAGNOSIS — G253 Myoclonus: Secondary | ICD-10-CM | POA: Diagnosis not present

## 2023-05-16 DIAGNOSIS — N179 Acute kidney failure, unspecified: Secondary | ICD-10-CM | POA: Diagnosis not present

## 2023-05-17 DIAGNOSIS — G253 Myoclonus: Secondary | ICD-10-CM | POA: Diagnosis not present

## 2023-05-17 DIAGNOSIS — G9341 Metabolic encephalopathy: Secondary | ICD-10-CM | POA: Diagnosis not present

## 2023-05-17 DIAGNOSIS — N179 Acute kidney failure, unspecified: Secondary | ICD-10-CM | POA: Diagnosis not present

## 2023-05-17 DIAGNOSIS — L03116 Cellulitis of left lower limb: Secondary | ICD-10-CM | POA: Diagnosis not present

## 2023-05-17 DIAGNOSIS — R600 Localized edema: Secondary | ICD-10-CM | POA: Diagnosis not present

## 2023-05-17 DIAGNOSIS — D539 Nutritional anemia, unspecified: Secondary | ICD-10-CM | POA: Diagnosis not present

## 2023-05-17 DIAGNOSIS — Z7984 Long term (current) use of oral hypoglycemic drugs: Secondary | ICD-10-CM | POA: Diagnosis not present

## 2023-05-17 DIAGNOSIS — N183 Chronic kidney disease, stage 3 unspecified: Secondary | ICD-10-CM | POA: Diagnosis not present

## 2023-05-17 DIAGNOSIS — I5033 Acute on chronic diastolic (congestive) heart failure: Secondary | ICD-10-CM | POA: Diagnosis not present

## 2023-05-17 DIAGNOSIS — Z95818 Presence of other cardiac implants and grafts: Secondary | ICD-10-CM | POA: Diagnosis not present

## 2023-05-17 DIAGNOSIS — R296 Repeated falls: Secondary | ICD-10-CM | POA: Diagnosis not present

## 2023-05-17 DIAGNOSIS — G9340 Encephalopathy, unspecified: Secondary | ICD-10-CM | POA: Diagnosis not present

## 2023-05-17 DIAGNOSIS — R2689 Other abnormalities of gait and mobility: Secondary | ICD-10-CM | POA: Diagnosis not present

## 2023-05-17 DIAGNOSIS — Z1152 Encounter for screening for COVID-19: Secondary | ICD-10-CM | POA: Diagnosis not present

## 2023-05-17 DIAGNOSIS — Z9181 History of falling: Secondary | ICD-10-CM | POA: Diagnosis not present

## 2023-05-17 DIAGNOSIS — E1165 Type 2 diabetes mellitus with hyperglycemia: Secondary | ICD-10-CM | POA: Diagnosis not present

## 2023-05-17 DIAGNOSIS — E8729 Other acidosis: Secondary | ICD-10-CM | POA: Diagnosis not present

## 2023-05-17 DIAGNOSIS — I130 Hypertensive heart and chronic kidney disease with heart failure and stage 1 through stage 4 chronic kidney disease, or unspecified chronic kidney disease: Secondary | ICD-10-CM | POA: Diagnosis not present

## 2023-05-17 DIAGNOSIS — R259 Unspecified abnormal involuntary movements: Secondary | ICD-10-CM | POA: Diagnosis not present

## 2023-05-17 DIAGNOSIS — E873 Alkalosis: Secondary | ICD-10-CM | POA: Diagnosis not present

## 2023-05-17 DIAGNOSIS — G4733 Obstructive sleep apnea (adult) (pediatric): Secondary | ICD-10-CM | POA: Diagnosis not present

## 2023-05-17 DIAGNOSIS — E1122 Type 2 diabetes mellitus with diabetic chronic kidney disease: Secondary | ICD-10-CM | POA: Diagnosis not present

## 2023-05-17 DIAGNOSIS — D631 Anemia in chronic kidney disease: Secondary | ICD-10-CM | POA: Diagnosis not present

## 2023-05-17 DIAGNOSIS — R531 Weakness: Secondary | ICD-10-CM | POA: Diagnosis not present

## 2023-05-17 DIAGNOSIS — S51011A Laceration without foreign body of right elbow, initial encounter: Secondary | ICD-10-CM | POA: Diagnosis not present

## 2023-05-18 DIAGNOSIS — N183 Chronic kidney disease, stage 3 unspecified: Secondary | ICD-10-CM | POA: Diagnosis not present

## 2023-05-18 DIAGNOSIS — R600 Localized edema: Secondary | ICD-10-CM | POA: Diagnosis not present

## 2023-05-18 DIAGNOSIS — Z1152 Encounter for screening for COVID-19: Secondary | ICD-10-CM | POA: Diagnosis not present

## 2023-05-18 DIAGNOSIS — G9340 Encephalopathy, unspecified: Secondary | ICD-10-CM | POA: Diagnosis not present

## 2023-05-18 DIAGNOSIS — G4733 Obstructive sleep apnea (adult) (pediatric): Secondary | ICD-10-CM | POA: Diagnosis not present

## 2023-05-18 DIAGNOSIS — L03116 Cellulitis of left lower limb: Secondary | ICD-10-CM | POA: Diagnosis not present

## 2023-05-18 DIAGNOSIS — Z7984 Long term (current) use of oral hypoglycemic drugs: Secondary | ICD-10-CM | POA: Diagnosis not present

## 2023-05-18 DIAGNOSIS — I130 Hypertensive heart and chronic kidney disease with heart failure and stage 1 through stage 4 chronic kidney disease, or unspecified chronic kidney disease: Secondary | ICD-10-CM | POA: Diagnosis not present

## 2023-05-18 DIAGNOSIS — E8729 Other acidosis: Secondary | ICD-10-CM | POA: Diagnosis not present

## 2023-05-18 DIAGNOSIS — R259 Unspecified abnormal involuntary movements: Secondary | ICD-10-CM | POA: Diagnosis not present

## 2023-05-18 DIAGNOSIS — I5033 Acute on chronic diastolic (congestive) heart failure: Secondary | ICD-10-CM | POA: Diagnosis not present

## 2023-05-18 DIAGNOSIS — R296 Repeated falls: Secondary | ICD-10-CM | POA: Diagnosis not present

## 2023-05-18 DIAGNOSIS — Z95818 Presence of other cardiac implants and grafts: Secondary | ICD-10-CM | POA: Diagnosis not present

## 2023-05-18 DIAGNOSIS — G253 Myoclonus: Secondary | ICD-10-CM | POA: Diagnosis not present

## 2023-05-18 DIAGNOSIS — S51011A Laceration without foreign body of right elbow, initial encounter: Secondary | ICD-10-CM | POA: Diagnosis not present

## 2023-05-18 DIAGNOSIS — E1165 Type 2 diabetes mellitus with hyperglycemia: Secondary | ICD-10-CM | POA: Diagnosis not present

## 2023-05-18 DIAGNOSIS — E1122 Type 2 diabetes mellitus with diabetic chronic kidney disease: Secondary | ICD-10-CM | POA: Diagnosis not present

## 2023-05-18 DIAGNOSIS — D539 Nutritional anemia, unspecified: Secondary | ICD-10-CM | POA: Diagnosis not present

## 2023-05-18 DIAGNOSIS — Z9181 History of falling: Secondary | ICD-10-CM | POA: Diagnosis not present

## 2023-05-18 DIAGNOSIS — N179 Acute kidney failure, unspecified: Secondary | ICD-10-CM | POA: Diagnosis not present

## 2023-05-18 DIAGNOSIS — E873 Alkalosis: Secondary | ICD-10-CM | POA: Diagnosis not present

## 2023-05-18 DIAGNOSIS — R531 Weakness: Secondary | ICD-10-CM | POA: Diagnosis not present

## 2023-05-18 DIAGNOSIS — D631 Anemia in chronic kidney disease: Secondary | ICD-10-CM | POA: Diagnosis not present

## 2023-05-18 DIAGNOSIS — G9341 Metabolic encephalopathy: Secondary | ICD-10-CM | POA: Diagnosis not present

## 2023-05-18 DIAGNOSIS — R2689 Other abnormalities of gait and mobility: Secondary | ICD-10-CM | POA: Diagnosis not present

## 2023-05-19 DIAGNOSIS — R600 Localized edema: Secondary | ICD-10-CM | POA: Diagnosis not present

## 2023-05-19 DIAGNOSIS — R2689 Other abnormalities of gait and mobility: Secondary | ICD-10-CM | POA: Diagnosis not present

## 2023-05-19 DIAGNOSIS — I130 Hypertensive heart and chronic kidney disease with heart failure and stage 1 through stage 4 chronic kidney disease, or unspecified chronic kidney disease: Secondary | ICD-10-CM | POA: Diagnosis not present

## 2023-05-19 DIAGNOSIS — N183 Chronic kidney disease, stage 3 unspecified: Secondary | ICD-10-CM | POA: Diagnosis not present

## 2023-05-19 DIAGNOSIS — J449 Chronic obstructive pulmonary disease, unspecified: Secondary | ICD-10-CM | POA: Diagnosis not present

## 2023-05-19 DIAGNOSIS — M6281 Muscle weakness (generalized): Secondary | ICD-10-CM | POA: Diagnosis not present

## 2023-05-19 DIAGNOSIS — S51011A Laceration without foreign body of right elbow, initial encounter: Secondary | ICD-10-CM | POA: Diagnosis not present

## 2023-05-19 DIAGNOSIS — D539 Nutritional anemia, unspecified: Secondary | ICD-10-CM | POA: Diagnosis not present

## 2023-05-19 DIAGNOSIS — Z95818 Presence of other cardiac implants and grafts: Secondary | ICD-10-CM | POA: Diagnosis not present

## 2023-05-19 DIAGNOSIS — R29898 Other symptoms and signs involving the musculoskeletal system: Secondary | ICD-10-CM | POA: Diagnosis not present

## 2023-05-19 DIAGNOSIS — K5900 Constipation, unspecified: Secondary | ICD-10-CM | POA: Diagnosis not present

## 2023-05-19 DIAGNOSIS — Z1152 Encounter for screening for COVID-19: Secondary | ICD-10-CM | POA: Diagnosis not present

## 2023-05-19 DIAGNOSIS — I10 Essential (primary) hypertension: Secondary | ICD-10-CM | POA: Diagnosis not present

## 2023-05-19 DIAGNOSIS — I480 Paroxysmal atrial fibrillation: Secondary | ICD-10-CM | POA: Diagnosis not present

## 2023-05-19 DIAGNOSIS — E785 Hyperlipidemia, unspecified: Secondary | ICD-10-CM | POA: Diagnosis not present

## 2023-05-19 DIAGNOSIS — D509 Iron deficiency anemia, unspecified: Secondary | ICD-10-CM | POA: Diagnosis not present

## 2023-05-19 DIAGNOSIS — L03116 Cellulitis of left lower limb: Secondary | ICD-10-CM | POA: Diagnosis not present

## 2023-05-19 DIAGNOSIS — G4733 Obstructive sleep apnea (adult) (pediatric): Secondary | ICD-10-CM | POA: Diagnosis not present

## 2023-05-19 DIAGNOSIS — R531 Weakness: Secondary | ICD-10-CM | POA: Diagnosis not present

## 2023-05-19 DIAGNOSIS — G9341 Metabolic encephalopathy: Secondary | ICD-10-CM | POA: Diagnosis not present

## 2023-05-19 DIAGNOSIS — E8729 Other acidosis: Secondary | ICD-10-CM | POA: Diagnosis not present

## 2023-05-19 DIAGNOSIS — Z7401 Bed confinement status: Secondary | ICD-10-CM | POA: Diagnosis not present

## 2023-05-19 DIAGNOSIS — I5033 Acute on chronic diastolic (congestive) heart failure: Secondary | ICD-10-CM | POA: Diagnosis not present

## 2023-05-19 DIAGNOSIS — R296 Repeated falls: Secondary | ICD-10-CM | POA: Diagnosis not present

## 2023-05-19 DIAGNOSIS — G894 Chronic pain syndrome: Secondary | ICD-10-CM | POA: Diagnosis not present

## 2023-05-19 DIAGNOSIS — E7800 Pure hypercholesterolemia, unspecified: Secondary | ICD-10-CM | POA: Diagnosis not present

## 2023-05-19 DIAGNOSIS — J440 Chronic obstructive pulmonary disease with acute lower respiratory infection: Secondary | ICD-10-CM | POA: Diagnosis not present

## 2023-05-19 DIAGNOSIS — F17200 Nicotine dependence, unspecified, uncomplicated: Secondary | ICD-10-CM | POA: Diagnosis not present

## 2023-05-19 DIAGNOSIS — D631 Anemia in chronic kidney disease: Secondary | ICD-10-CM | POA: Diagnosis not present

## 2023-05-19 DIAGNOSIS — Z7984 Long term (current) use of oral hypoglycemic drugs: Secondary | ICD-10-CM | POA: Diagnosis not present

## 2023-05-19 DIAGNOSIS — Z23 Encounter for immunization: Secondary | ICD-10-CM | POA: Diagnosis not present

## 2023-05-19 DIAGNOSIS — M170 Bilateral primary osteoarthritis of knee: Secondary | ICD-10-CM | POA: Diagnosis not present

## 2023-05-19 DIAGNOSIS — E1165 Type 2 diabetes mellitus with hyperglycemia: Secondary | ICD-10-CM | POA: Diagnosis not present

## 2023-05-19 DIAGNOSIS — G253 Myoclonus: Secondary | ICD-10-CM | POA: Diagnosis not present

## 2023-05-19 DIAGNOSIS — Z9981 Dependence on supplemental oxygen: Secondary | ICD-10-CM | POA: Diagnosis not present

## 2023-05-19 DIAGNOSIS — N179 Acute kidney failure, unspecified: Secondary | ICD-10-CM | POA: Diagnosis not present

## 2023-05-19 DIAGNOSIS — I25119 Atherosclerotic heart disease of native coronary artery with unspecified angina pectoris: Secondary | ICD-10-CM | POA: Diagnosis not present

## 2023-05-19 DIAGNOSIS — E559 Vitamin D deficiency, unspecified: Secondary | ICD-10-CM | POA: Diagnosis not present

## 2023-05-19 DIAGNOSIS — R0902 Hypoxemia: Secondary | ICD-10-CM | POA: Diagnosis not present

## 2023-05-19 DIAGNOSIS — I5032 Chronic diastolic (congestive) heart failure: Secondary | ICD-10-CM | POA: Diagnosis not present

## 2023-05-19 DIAGNOSIS — E1122 Type 2 diabetes mellitus with diabetic chronic kidney disease: Secondary | ICD-10-CM | POA: Diagnosis not present

## 2023-05-19 DIAGNOSIS — M109 Gout, unspecified: Secondary | ICD-10-CM | POA: Diagnosis not present

## 2023-05-19 DIAGNOSIS — E873 Alkalosis: Secondary | ICD-10-CM | POA: Diagnosis not present

## 2023-05-19 DIAGNOSIS — Z89512 Acquired absence of left leg below knee: Secondary | ICD-10-CM | POA: Diagnosis not present

## 2023-05-19 DIAGNOSIS — Z9181 History of falling: Secondary | ICD-10-CM | POA: Diagnosis not present

## 2023-05-19 DIAGNOSIS — I2510 Atherosclerotic heart disease of native coronary artery without angina pectoris: Secondary | ICD-10-CM | POA: Diagnosis not present

## 2023-05-19 DIAGNOSIS — Z743 Need for continuous supervision: Secondary | ICD-10-CM | POA: Diagnosis not present

## 2023-05-21 ENCOUNTER — Other Ambulatory Visit: Payer: Self-pay | Admitting: Family

## 2023-05-21 DIAGNOSIS — I10 Essential (primary) hypertension: Secondary | ICD-10-CM

## 2023-05-22 DIAGNOSIS — M109 Gout, unspecified: Secondary | ICD-10-CM | POA: Diagnosis not present

## 2023-05-22 DIAGNOSIS — R531 Weakness: Secondary | ICD-10-CM | POA: Diagnosis not present

## 2023-05-22 DIAGNOSIS — F17200 Nicotine dependence, unspecified, uncomplicated: Secondary | ICD-10-CM | POA: Diagnosis not present

## 2023-05-22 DIAGNOSIS — I2510 Atherosclerotic heart disease of native coronary artery without angina pectoris: Secondary | ICD-10-CM | POA: Diagnosis not present

## 2023-05-22 DIAGNOSIS — I10 Essential (primary) hypertension: Secondary | ICD-10-CM | POA: Diagnosis not present

## 2023-05-22 DIAGNOSIS — J449 Chronic obstructive pulmonary disease, unspecified: Secondary | ICD-10-CM | POA: Diagnosis not present

## 2023-05-22 DIAGNOSIS — E7800 Pure hypercholesterolemia, unspecified: Secondary | ICD-10-CM | POA: Diagnosis not present

## 2023-05-22 DIAGNOSIS — Z89512 Acquired absence of left leg below knee: Secondary | ICD-10-CM | POA: Diagnosis not present

## 2023-05-22 DIAGNOSIS — N179 Acute kidney failure, unspecified: Secondary | ICD-10-CM | POA: Diagnosis not present

## 2023-05-22 DIAGNOSIS — E559 Vitamin D deficiency, unspecified: Secondary | ICD-10-CM | POA: Diagnosis not present

## 2023-05-25 DIAGNOSIS — J449 Chronic obstructive pulmonary disease, unspecified: Secondary | ICD-10-CM | POA: Diagnosis not present

## 2023-05-25 DIAGNOSIS — E7800 Pure hypercholesterolemia, unspecified: Secondary | ICD-10-CM | POA: Diagnosis not present

## 2023-05-25 DIAGNOSIS — Z89512 Acquired absence of left leg below knee: Secondary | ICD-10-CM | POA: Diagnosis not present

## 2023-05-25 DIAGNOSIS — N179 Acute kidney failure, unspecified: Secondary | ICD-10-CM | POA: Diagnosis not present

## 2023-05-25 DIAGNOSIS — I25119 Atherosclerotic heart disease of native coronary artery with unspecified angina pectoris: Secondary | ICD-10-CM | POA: Diagnosis not present

## 2023-05-25 DIAGNOSIS — E559 Vitamin D deficiency, unspecified: Secondary | ICD-10-CM | POA: Diagnosis not present

## 2023-05-25 DIAGNOSIS — I10 Essential (primary) hypertension: Secondary | ICD-10-CM | POA: Diagnosis not present

## 2023-05-25 DIAGNOSIS — M6281 Muscle weakness (generalized): Secondary | ICD-10-CM | POA: Diagnosis not present

## 2023-05-25 DIAGNOSIS — M109 Gout, unspecified: Secondary | ICD-10-CM | POA: Diagnosis not present

## 2023-05-25 DIAGNOSIS — F17200 Nicotine dependence, unspecified, uncomplicated: Secondary | ICD-10-CM | POA: Diagnosis not present

## 2023-05-27 DIAGNOSIS — E7800 Pure hypercholesterolemia, unspecified: Secondary | ICD-10-CM | POA: Diagnosis not present

## 2023-05-27 DIAGNOSIS — J440 Chronic obstructive pulmonary disease with acute lower respiratory infection: Secondary | ICD-10-CM | POA: Diagnosis not present

## 2023-05-27 DIAGNOSIS — M6281 Muscle weakness (generalized): Secondary | ICD-10-CM | POA: Diagnosis not present

## 2023-05-27 DIAGNOSIS — I10 Essential (primary) hypertension: Secondary | ICD-10-CM | POA: Diagnosis not present

## 2023-05-27 DIAGNOSIS — Z9981 Dependence on supplemental oxygen: Secondary | ICD-10-CM | POA: Diagnosis not present

## 2023-05-28 DIAGNOSIS — M170 Bilateral primary osteoarthritis of knee: Secondary | ICD-10-CM | POA: Diagnosis not present

## 2023-05-29 DIAGNOSIS — M6281 Muscle weakness (generalized): Secondary | ICD-10-CM | POA: Diagnosis not present

## 2023-05-29 DIAGNOSIS — M109 Gout, unspecified: Secondary | ICD-10-CM | POA: Diagnosis not present

## 2023-05-29 DIAGNOSIS — D509 Iron deficiency anemia, unspecified: Secondary | ICD-10-CM | POA: Diagnosis not present

## 2023-05-29 DIAGNOSIS — Z89512 Acquired absence of left leg below knee: Secondary | ICD-10-CM | POA: Diagnosis not present

## 2023-06-01 DIAGNOSIS — D509 Iron deficiency anemia, unspecified: Secondary | ICD-10-CM | POA: Diagnosis not present

## 2023-06-01 DIAGNOSIS — R600 Localized edema: Secondary | ICD-10-CM | POA: Diagnosis not present

## 2023-06-01 DIAGNOSIS — R29898 Other symptoms and signs involving the musculoskeletal system: Secondary | ICD-10-CM | POA: Diagnosis not present

## 2023-06-01 DIAGNOSIS — I10 Essential (primary) hypertension: Secondary | ICD-10-CM | POA: Diagnosis not present

## 2023-06-02 DIAGNOSIS — I10 Essential (primary) hypertension: Secondary | ICD-10-CM | POA: Diagnosis not present

## 2023-06-02 DIAGNOSIS — R29898 Other symptoms and signs involving the musculoskeletal system: Secondary | ICD-10-CM | POA: Diagnosis not present

## 2023-06-03 DIAGNOSIS — M109 Gout, unspecified: Secondary | ICD-10-CM | POA: Diagnosis not present

## 2023-06-03 DIAGNOSIS — D509 Iron deficiency anemia, unspecified: Secondary | ICD-10-CM | POA: Diagnosis not present

## 2023-06-03 DIAGNOSIS — K5900 Constipation, unspecified: Secondary | ICD-10-CM | POA: Diagnosis not present

## 2023-06-03 DIAGNOSIS — I10 Essential (primary) hypertension: Secondary | ICD-10-CM | POA: Diagnosis not present

## 2023-06-03 DIAGNOSIS — E785 Hyperlipidemia, unspecified: Secondary | ICD-10-CM | POA: Diagnosis not present

## 2023-06-03 DIAGNOSIS — E559 Vitamin D deficiency, unspecified: Secondary | ICD-10-CM | POA: Diagnosis not present

## 2023-06-09 ENCOUNTER — Other Ambulatory Visit: Payer: Self-pay | Admitting: Family

## 2023-06-13 ENCOUNTER — Other Ambulatory Visit: Payer: Self-pay | Admitting: Family

## 2023-06-13 DIAGNOSIS — F419 Anxiety disorder, unspecified: Secondary | ICD-10-CM

## 2023-06-17 DIAGNOSIS — D509 Iron deficiency anemia, unspecified: Secondary | ICD-10-CM | POA: Diagnosis not present

## 2023-06-17 DIAGNOSIS — D631 Anemia in chronic kidney disease: Secondary | ICD-10-CM | POA: Diagnosis not present

## 2023-06-17 DIAGNOSIS — N183 Chronic kidney disease, stage 3 unspecified: Secondary | ICD-10-CM | POA: Diagnosis not present

## 2023-06-17 DIAGNOSIS — Z85828 Personal history of other malignant neoplasm of skin: Secondary | ICD-10-CM | POA: Diagnosis not present

## 2023-06-17 DIAGNOSIS — Z556 Problems related to health literacy: Secondary | ICD-10-CM | POA: Diagnosis not present

## 2023-06-17 DIAGNOSIS — Z9181 History of falling: Secondary | ICD-10-CM | POA: Diagnosis not present

## 2023-06-17 DIAGNOSIS — J449 Chronic obstructive pulmonary disease, unspecified: Secondary | ICD-10-CM | POA: Diagnosis not present

## 2023-06-17 DIAGNOSIS — I480 Paroxysmal atrial fibrillation: Secondary | ICD-10-CM | POA: Diagnosis not present

## 2023-06-17 DIAGNOSIS — I2510 Atherosclerotic heart disease of native coronary artery without angina pectoris: Secondary | ICD-10-CM | POA: Diagnosis not present

## 2023-06-17 DIAGNOSIS — E785 Hyperlipidemia, unspecified: Secondary | ICD-10-CM | POA: Diagnosis not present

## 2023-06-17 DIAGNOSIS — G4733 Obstructive sleep apnea (adult) (pediatric): Secondary | ICD-10-CM | POA: Diagnosis not present

## 2023-06-17 DIAGNOSIS — E1122 Type 2 diabetes mellitus with diabetic chronic kidney disease: Secondary | ICD-10-CM | POA: Diagnosis not present

## 2023-06-17 DIAGNOSIS — L03116 Cellulitis of left lower limb: Secondary | ICD-10-CM | POA: Diagnosis not present

## 2023-06-17 DIAGNOSIS — I130 Hypertensive heart and chronic kidney disease with heart failure and stage 1 through stage 4 chronic kidney disease, or unspecified chronic kidney disease: Secondary | ICD-10-CM | POA: Diagnosis not present

## 2023-06-17 DIAGNOSIS — E538 Deficiency of other specified B group vitamins: Secondary | ICD-10-CM | POA: Diagnosis not present

## 2023-06-17 DIAGNOSIS — I5032 Chronic diastolic (congestive) heart failure: Secondary | ICD-10-CM | POA: Diagnosis not present

## 2023-06-17 DIAGNOSIS — Z7982 Long term (current) use of aspirin: Secondary | ICD-10-CM | POA: Diagnosis not present

## 2023-06-17 DIAGNOSIS — Z9981 Dependence on supplemental oxygen: Secondary | ICD-10-CM | POA: Diagnosis not present

## 2023-06-17 DIAGNOSIS — Z87891 Personal history of nicotine dependence: Secondary | ICD-10-CM | POA: Diagnosis not present

## 2023-06-19 DIAGNOSIS — J449 Chronic obstructive pulmonary disease, unspecified: Secondary | ICD-10-CM | POA: Diagnosis not present

## 2023-06-19 DIAGNOSIS — N183 Chronic kidney disease, stage 3 unspecified: Secondary | ICD-10-CM | POA: Diagnosis not present

## 2023-06-19 DIAGNOSIS — D509 Iron deficiency anemia, unspecified: Secondary | ICD-10-CM | POA: Diagnosis not present

## 2023-06-19 DIAGNOSIS — Z85828 Personal history of other malignant neoplasm of skin: Secondary | ICD-10-CM | POA: Diagnosis not present

## 2023-06-19 DIAGNOSIS — Z7982 Long term (current) use of aspirin: Secondary | ICD-10-CM | POA: Diagnosis not present

## 2023-06-19 DIAGNOSIS — D631 Anemia in chronic kidney disease: Secondary | ICD-10-CM | POA: Diagnosis not present

## 2023-06-19 DIAGNOSIS — Z9181 History of falling: Secondary | ICD-10-CM | POA: Diagnosis not present

## 2023-06-19 DIAGNOSIS — I480 Paroxysmal atrial fibrillation: Secondary | ICD-10-CM | POA: Diagnosis not present

## 2023-06-19 DIAGNOSIS — Z87891 Personal history of nicotine dependence: Secondary | ICD-10-CM | POA: Diagnosis not present

## 2023-06-19 DIAGNOSIS — E1122 Type 2 diabetes mellitus with diabetic chronic kidney disease: Secondary | ICD-10-CM | POA: Diagnosis not present

## 2023-06-19 DIAGNOSIS — I5032 Chronic diastolic (congestive) heart failure: Secondary | ICD-10-CM | POA: Diagnosis not present

## 2023-06-19 DIAGNOSIS — I130 Hypertensive heart and chronic kidney disease with heart failure and stage 1 through stage 4 chronic kidney disease, or unspecified chronic kidney disease: Secondary | ICD-10-CM | POA: Diagnosis not present

## 2023-06-19 DIAGNOSIS — E538 Deficiency of other specified B group vitamins: Secondary | ICD-10-CM | POA: Diagnosis not present

## 2023-06-19 DIAGNOSIS — Z556 Problems related to health literacy: Secondary | ICD-10-CM | POA: Diagnosis not present

## 2023-06-19 DIAGNOSIS — Z9981 Dependence on supplemental oxygen: Secondary | ICD-10-CM | POA: Diagnosis not present

## 2023-06-19 DIAGNOSIS — G4733 Obstructive sleep apnea (adult) (pediatric): Secondary | ICD-10-CM | POA: Diagnosis not present

## 2023-06-19 DIAGNOSIS — L03116 Cellulitis of left lower limb: Secondary | ICD-10-CM | POA: Diagnosis not present

## 2023-06-19 DIAGNOSIS — I2510 Atherosclerotic heart disease of native coronary artery without angina pectoris: Secondary | ICD-10-CM | POA: Diagnosis not present

## 2023-06-19 DIAGNOSIS — E785 Hyperlipidemia, unspecified: Secondary | ICD-10-CM | POA: Diagnosis not present

## 2023-06-22 ENCOUNTER — Telehealth: Payer: Self-pay

## 2023-06-22 ENCOUNTER — Encounter: Payer: Self-pay | Admitting: Family

## 2023-06-22 ENCOUNTER — Ambulatory Visit (INDEPENDENT_AMBULATORY_CARE_PROVIDER_SITE_OTHER): Admitting: Family

## 2023-06-22 VITALS — BP 119/57 | HR 77 | Temp 97.5°F | Ht 69.0 in | Wt 292.0 lb

## 2023-06-22 DIAGNOSIS — I10 Essential (primary) hypertension: Secondary | ICD-10-CM

## 2023-06-22 DIAGNOSIS — J9611 Chronic respiratory failure with hypoxia: Secondary | ICD-10-CM

## 2023-06-22 DIAGNOSIS — Z9981 Dependence on supplemental oxygen: Secondary | ICD-10-CM

## 2023-06-22 DIAGNOSIS — M1712 Unilateral primary osteoarthritis, left knee: Secondary | ICD-10-CM | POA: Diagnosis not present

## 2023-06-22 DIAGNOSIS — R531 Weakness: Secondary | ICD-10-CM

## 2023-06-22 NOTE — Patient Instructions (Signed)

## 2023-06-22 NOTE — Telephone Encounter (Signed)
 Spoke with Marcus Carr at [facility], gave verbal ok for Occupational Therapy evaluation and Physical Therapy evaluation. [facility] 03/31

## 2023-06-22 NOTE — Telephone Encounter (Signed)
 Spoke with Rexford Maus at [facility], gave verbal ok for Occupational Therapy evaluation and Physical Therapy evaluation. [facility] 03/31

## 2023-06-22 NOTE — Progress Notes (Signed)
 Subjective:    Patient ID: Marcus Carr, male    DOB: Oct 19, 1940, 83 y.o.   MRN: [id number]  Chief Complaint  Patient presents with   Hospitalization Follow-up   PT presents to the office today for hospital follow up. He went to the ED on 05/11/23 confusion and cellulitis of left thigh. He was diagnosed with acute metabolic encephalopathy. He has OSA and had not been using his CPAP. He has started using this.   Found to have AKI, on discharge creatine back on baseline.   Cellulitis of left thigh, completed ceftiraxone.   "Chronic edema -Suspect this is in large part secondary to untreated sleep apnea -Echo in October 2024 showed EF of 60 to 65%, mild diastolic dysfunction with low to normal LA pressure, mild bit dilatation of right ventricle with normal right ventricular systolic pressure -He has not required any diuretics during this hospitalization and appears that he is not retaining any fluid based on intake and output -Weight actually down from 140.6 kg to 134.7kg -Encouraged compliance with CPAP and hopefully he will not require loop diuretics in the future secondary to marked metabolic alkalosis which contributed to hypercarbia"  He was discharged to SNF on 05/19/23. He was discharged home from SNF apprx 2 weeks later.   Reports he is feeling better. Denies any fever.      06/22/2023   11:35 AM 03/02/2023    2:45 PM 02/18/2023   10:11 AM  Last 3 Weights  Weight (lbs) 292 lb -- 313 lb 3.2 oz  Weight (kg) 132.45 kg -- 142.067 kg     Hypertension This is a chronic problem. The current episode started more than 1 year ago. The problem has been resolved since onset. The problem is controlled. Associated symptoms include malaise/fatigue, peripheral edema and shortness of breath. The current treatment provides moderate improvement.  Knee Pain  The incident occurred more than 1 week ago. The injury mechanism was a fall. The pain is present in the left knee. The quality of  the pain is described as aching. The pain is at a severity of 5/10. The pain is moderate. The pain has been Intermittent since onset. Pertinent negatives include no numbness or tingling. He reports no foreign bodies present. The symptoms are aggravated by weight bearing and movement. He has tried rest for the symptoms.      Review of Systems  Constitutional:  Positive for malaise/fatigue.  Respiratory:  Positive for shortness of breath.   Neurological:  Negative for tingling and numbness.  All other systems reviewed and are negative.   Social History   Socioeconomic History   Marital status: Married    Spouse name: Rosa   Number of children: 2   Years of education: 8   Highest education level: 8th grade  Occupational History   Occupation: Retired  Tobacco Use   Smoking status: Former    Current packs/day: 0.00    Average packs/day: 1.5 packs/day for 50.0 years (75.0 ttl pk-yrs)    Types: Cigarettes    Start date: 10/23/1964    Quit date: 10/24/2014    Years since quitting: 8.6   Smokeless tobacco: Never  Vaping Use   Vaping status: Never Used  Substance and Sexual Activity   Alcohol use: No    Alcohol/week: 0.0 standard drinks of alcohol   Drug use: No   Sexual activity: Not Currently  Other Topics Concern   Not on file  Social History Narrative   Lives wife and grandson  stays with them a lot.  Two daughters - both live within 10 miles   Social Drivers of Health   Financial Resource Strain: Patient Declined (04/27/2023)   Received from [facility]   Overall Financial Resource Strain (CARDIA)    Difficulty of Paying Living Expenses: Patient declined  Food Insecurity: No Food Insecurity (05/11/2023)   Received from [facility]   Hunger Vital Sign    Worried About Running Out of Food in the Last Year: Never true    Ran Out of Food in the Last Year: Never true  Transportation Needs: No Transportation Needs (05/14/2023)   Received from [facility] -  Transportation    Lack of Transportation (Medical): No    Lack of Transportation (Non-Medical): No  Physical Activity: Inactive (01/21/2023)   Exercise Vital Sign    Days of Exercise per Week: 0 days    Minutes of Exercise per Session: 0 min  Stress: Stress Concern Present (05/11/2023)   Received from [facility] - Occupational Stress Questionnaire    Feeling of Stress : To some extent  Social Connections: Moderately Isolated (01/21/2023)   Social Connection and Isolation Panel [NHANES]    Frequency of Communication with Friends and Family: More than three times a week    Frequency of Social Gatherings with Friends and Family: More than three times a week    Attends Religious Services: Never    Database administrator or Organizations: No    Attends Banker Meetings: Never    Marital Status: Married   Family History  Problem Relation Age of Onset   Cancer Mother        esophageal   Heart attack Father 73        Objective:   Physical Exam Vitals reviewed.  Constitutional:      General: He is not in acute distress.    Appearance: He is well-developed. He is obese.  HENT:     Head: Normocephalic.     Right Ear: Tympanic membrane normal.     Left Ear: Tympanic membrane normal.  Eyes:     General:        Right eye: No discharge.        Left eye: No discharge.     Pupils: Pupils are equal, round, and reactive to light.  Neck:     Thyroid: No thyromegaly.  Cardiovascular:     Rate and Rhythm: Normal rate and regular rhythm.     Heart sounds: Normal heart sounds. No murmur heard. Pulmonary:     Effort: Pulmonary effort is normal. No respiratory distress.     Breath sounds: Rhonchi present. No wheezing.     Comments: 2 L of O2 Abdominal:     General: Bowel sounds are normal. There is no distension.     Palpations: Abdomen is soft.     Tenderness: There is no abdominal tenderness.  Musculoskeletal:        General:  No tenderness. Normal range of motion.     Cervical back: Normal range of motion and neck supple.     Right lower leg: Edema (2+) present.     Left lower leg: Edema (2+) present.  Skin:    General: Skin is warm and dry.     Findings: No erythema or rash.  Neurological:     Mental Status: He is alert and oriented to person, place, and time.     Cranial  Nerves: No cranial nerve deficit.     Motor: Weakness (in wheelchair) present.     Gait: Gait abnormal.     Deep Tendon Reflexes: Reflexes are normal and symmetric.  Psychiatric:        Behavior: Behavior normal.        Thought Content: Thought content normal.        Judgment: Judgment normal.       BP (!) 119/57   Pulse 77   Temp (!) 97.5 F (36.4 C) (Temporal)   Ht 5\' 9"  (1.753 m)   Wt 292 lb (132.5 kg)   SpO2 93% Comment: his on 2 litters of o2  BMI 43.12 kg/m      Assessment & Plan:  Rohaan Durnil comes in today with chief complaint of Hospitalization Follow-up   Diagnosis and orders addressed:  1. Chronic hypoxic respiratory failure, on home oxygen therapy (HCC) (Primary) - CMP14+EGFR - CBC with Differential/Platelet  2. Generalized weakness - CMP14+EGFR - CBC with Differential/Platelet  3. Morbid obesity (HCC)  - CMP14+EGFR - CBC with Differential/Platelet  4. Primary osteoarthritis of left knee - CMP14+EGFR - CBC with Differential/Platelet  5. Primary hypertension - CMP14+EGFR - CBC with Differential/Platelet   Labs pending Continue current medications  Keep follow up with specialists  Health Maintenance reviewed Diet and exercise encouraged  No follow-ups on file.    Jannifer Rodney, FNP

## 2023-06-22 NOTE — Telephone Encounter (Signed)
 Copied from CRM [id number]. Topic: Clinical - Home Health Verbal Orders >> Jun 22, 2023  1:41 PM Gery Pray wrote: Caller/Agency: [facility] Callback Number: [phone] Service Requested: Occupational Therapy Frequency: 1 week 9  Any new concerns about the patient? No

## 2023-06-23 ENCOUNTER — Ambulatory Visit (INDEPENDENT_AMBULATORY_CARE_PROVIDER_SITE_OTHER)

## 2023-06-23 DIAGNOSIS — D631 Anemia in chronic kidney disease: Secondary | ICD-10-CM

## 2023-06-23 DIAGNOSIS — I130 Hypertensive heart and chronic kidney disease with heart failure and stage 1 through stage 4 chronic kidney disease, or unspecified chronic kidney disease: Secondary | ICD-10-CM

## 2023-06-23 DIAGNOSIS — I480 Paroxysmal atrial fibrillation: Secondary | ICD-10-CM

## 2023-06-23 DIAGNOSIS — J449 Chronic obstructive pulmonary disease, unspecified: Secondary | ICD-10-CM

## 2023-06-23 DIAGNOSIS — I5032 Chronic diastolic (congestive) heart failure: Secondary | ICD-10-CM

## 2023-06-23 DIAGNOSIS — E1122 Type 2 diabetes mellitus with diabetic chronic kidney disease: Secondary | ICD-10-CM | POA: Diagnosis not present

## 2023-06-23 DIAGNOSIS — I2510 Atherosclerotic heart disease of native coronary artery without angina pectoris: Secondary | ICD-10-CM

## 2023-06-23 DIAGNOSIS — N183 Chronic kidney disease, stage 3 unspecified: Secondary | ICD-10-CM | POA: Diagnosis not present

## 2023-06-23 DIAGNOSIS — L03116 Cellulitis of left lower limb: Secondary | ICD-10-CM | POA: Diagnosis not present

## 2023-06-23 DIAGNOSIS — F32A Depression, unspecified: Secondary | ICD-10-CM

## 2023-06-23 DIAGNOSIS — Z6841 Body Mass Index (BMI) 40.0 and over, adult: Secondary | ICD-10-CM

## 2023-06-23 LAB — CBC WITH DIFFERENTIAL/PLATELET
Basophils Absolute: 0 10*3/uL (ref 0.0–0.2)
Basos: 0 %
EOS (ABSOLUTE): 0.2 10*3/uL (ref 0.0–0.4)
Eos: 3 %
Hematocrit: 40 % (ref 37.5–51.0)
Hemoglobin: 12.7 g/dL — ABNORMAL LOW (ref 13.0–17.7)
Immature Grans (Abs): 0 10*3/uL (ref 0.0–0.1)
Immature Granulocytes: 0 %
Lymphocytes Absolute: 1.2 10*3/uL (ref 0.7–3.1)
Lymphs: 17 %
MCH: 28.5 pg (ref 26.6–33.0)
MCHC: 31.8 g/dL (ref 31.5–35.7)
MCV: 90 fL (ref 79–97)
Monocytes Absolute: 0.5 10*3/uL (ref 0.1–0.9)
Monocytes: 8 %
Neutrophils Absolute: 4.8 10*3/uL (ref 1.4–7.0)
Neutrophils: 72 %
Platelets: 188 10*3/uL (ref 150–450)
RBC: 4.46 x10E6/uL (ref 4.14–5.80)
RDW: 13.9 % (ref 11.6–15.4)
WBC: 6.7 10*3/uL (ref 3.4–10.8)

## 2023-06-23 LAB — CMP14+EGFR
ALT: 10 IU/L (ref 0–44)
AST: 17 IU/L (ref 0–40)
Albumin: 3.7 g/dL (ref 3.7–4.7)
Alkaline Phosphatase: 127 IU/L — ABNORMAL HIGH (ref 44–121)
BUN/Creatinine Ratio: 18 (ref 10–24)
BUN: 25 mg/dL (ref 8–27)
Bilirubin Total: 0.3 mg/dL (ref 0.0–1.2)
CO2: 29 mmol/L (ref 20–29)
Calcium: 9.3 mg/dL (ref 8.6–10.2)
Chloride: 97 mmol/L (ref 96–106)
Creatinine, Ser: 1.4 mg/dL — ABNORMAL HIGH (ref 0.76–1.27)
Globulin, Total: 2.9 g/dL (ref 1.5–4.5)
Glucose: 100 mg/dL — ABNORMAL HIGH (ref 70–99)
Potassium: 5 mmol/L (ref 3.5–5.2)
Sodium: 138 mmol/L (ref 134–144)
Total Protein: 6.6 g/dL (ref 6.0–8.5)
eGFR: 50 mL/min/{1.73_m2} — ABNORMAL LOW (ref >59)

## 2023-06-24 DIAGNOSIS — L03116 Cellulitis of left lower limb: Secondary | ICD-10-CM | POA: Diagnosis not present

## 2023-06-24 DIAGNOSIS — G4733 Obstructive sleep apnea (adult) (pediatric): Secondary | ICD-10-CM | POA: Diagnosis not present

## 2023-06-24 DIAGNOSIS — N183 Chronic kidney disease, stage 3 unspecified: Secondary | ICD-10-CM | POA: Diagnosis not present

## 2023-06-24 DIAGNOSIS — I480 Paroxysmal atrial fibrillation: Secondary | ICD-10-CM | POA: Diagnosis not present

## 2023-06-24 DIAGNOSIS — I5032 Chronic diastolic (congestive) heart failure: Secondary | ICD-10-CM | POA: Diagnosis not present

## 2023-06-24 DIAGNOSIS — Z9181 History of falling: Secondary | ICD-10-CM | POA: Diagnosis not present

## 2023-06-24 DIAGNOSIS — I130 Hypertensive heart and chronic kidney disease with heart failure and stage 1 through stage 4 chronic kidney disease, or unspecified chronic kidney disease: Secondary | ICD-10-CM | POA: Diagnosis not present

## 2023-06-24 DIAGNOSIS — Z87891 Personal history of nicotine dependence: Secondary | ICD-10-CM | POA: Diagnosis not present

## 2023-06-24 DIAGNOSIS — D509 Iron deficiency anemia, unspecified: Secondary | ICD-10-CM | POA: Diagnosis not present

## 2023-06-24 DIAGNOSIS — E538 Deficiency of other specified B group vitamins: Secondary | ICD-10-CM | POA: Diagnosis not present

## 2023-06-24 DIAGNOSIS — Z9981 Dependence on supplemental oxygen: Secondary | ICD-10-CM | POA: Diagnosis not present

## 2023-06-24 DIAGNOSIS — E1122 Type 2 diabetes mellitus with diabetic chronic kidney disease: Secondary | ICD-10-CM | POA: Diagnosis not present

## 2023-06-24 DIAGNOSIS — Z556 Problems related to health literacy: Secondary | ICD-10-CM | POA: Diagnosis not present

## 2023-06-24 DIAGNOSIS — I2510 Atherosclerotic heart disease of native coronary artery without angina pectoris: Secondary | ICD-10-CM | POA: Diagnosis not present

## 2023-06-24 DIAGNOSIS — Z85828 Personal history of other malignant neoplasm of skin: Secondary | ICD-10-CM | POA: Diagnosis not present

## 2023-06-24 DIAGNOSIS — Z7982 Long term (current) use of aspirin: Secondary | ICD-10-CM | POA: Diagnosis not present

## 2023-06-24 DIAGNOSIS — D631 Anemia in chronic kidney disease: Secondary | ICD-10-CM | POA: Diagnosis not present

## 2023-06-24 DIAGNOSIS — J449 Chronic obstructive pulmonary disease, unspecified: Secondary | ICD-10-CM | POA: Diagnosis not present

## 2023-06-24 DIAGNOSIS — E785 Hyperlipidemia, unspecified: Secondary | ICD-10-CM | POA: Diagnosis not present

## 2023-06-26 DIAGNOSIS — Z85828 Personal history of other malignant neoplasm of skin: Secondary | ICD-10-CM | POA: Diagnosis not present

## 2023-06-26 DIAGNOSIS — D631 Anemia in chronic kidney disease: Secondary | ICD-10-CM | POA: Diagnosis not present

## 2023-06-26 DIAGNOSIS — G894 Chronic pain syndrome: Secondary | ICD-10-CM | POA: Diagnosis not present

## 2023-06-26 DIAGNOSIS — I480 Paroxysmal atrial fibrillation: Secondary | ICD-10-CM | POA: Diagnosis not present

## 2023-06-26 DIAGNOSIS — D509 Iron deficiency anemia, unspecified: Secondary | ICD-10-CM | POA: Diagnosis not present

## 2023-06-26 DIAGNOSIS — I2510 Atherosclerotic heart disease of native coronary artery without angina pectoris: Secondary | ICD-10-CM | POA: Diagnosis not present

## 2023-06-26 DIAGNOSIS — Z87891 Personal history of nicotine dependence: Secondary | ICD-10-CM | POA: Diagnosis not present

## 2023-06-26 DIAGNOSIS — Z9981 Dependence on supplemental oxygen: Secondary | ICD-10-CM | POA: Diagnosis not present

## 2023-06-26 DIAGNOSIS — Z9181 History of falling: Secondary | ICD-10-CM | POA: Diagnosis not present

## 2023-06-26 DIAGNOSIS — J449 Chronic obstructive pulmonary disease, unspecified: Secondary | ICD-10-CM | POA: Diagnosis not present

## 2023-06-26 DIAGNOSIS — Z7982 Long term (current) use of aspirin: Secondary | ICD-10-CM | POA: Diagnosis not present

## 2023-06-26 DIAGNOSIS — E785 Hyperlipidemia, unspecified: Secondary | ICD-10-CM | POA: Diagnosis not present

## 2023-06-26 DIAGNOSIS — I130 Hypertensive heart and chronic kidney disease with heart failure and stage 1 through stage 4 chronic kidney disease, or unspecified chronic kidney disease: Secondary | ICD-10-CM | POA: Diagnosis not present

## 2023-06-26 DIAGNOSIS — E1122 Type 2 diabetes mellitus with diabetic chronic kidney disease: Secondary | ICD-10-CM | POA: Diagnosis not present

## 2023-06-26 DIAGNOSIS — N183 Chronic kidney disease, stage 3 unspecified: Secondary | ICD-10-CM | POA: Diagnosis not present

## 2023-06-26 DIAGNOSIS — L03116 Cellulitis of left lower limb: Secondary | ICD-10-CM | POA: Diagnosis not present

## 2023-06-26 DIAGNOSIS — E538 Deficiency of other specified B group vitamins: Secondary | ICD-10-CM | POA: Diagnosis not present

## 2023-06-26 DIAGNOSIS — I5032 Chronic diastolic (congestive) heart failure: Secondary | ICD-10-CM | POA: Diagnosis not present

## 2023-06-26 DIAGNOSIS — G4733 Obstructive sleep apnea (adult) (pediatric): Secondary | ICD-10-CM | POA: Diagnosis not present

## 2023-06-26 DIAGNOSIS — Z556 Problems related to health literacy: Secondary | ICD-10-CM | POA: Diagnosis not present

## 2023-06-28 ENCOUNTER — Other Ambulatory Visit: Payer: Self-pay | Admitting: Family

## 2023-06-28 DIAGNOSIS — J209 Acute bronchitis, unspecified: Secondary | ICD-10-CM

## 2023-06-28 DIAGNOSIS — Z95818 Presence of other cardiac implants and grafts: Secondary | ICD-10-CM | POA: Diagnosis not present

## 2023-06-28 DIAGNOSIS — I480 Paroxysmal atrial fibrillation: Secondary | ICD-10-CM | POA: Diagnosis not present

## 2023-06-30 DIAGNOSIS — I480 Paroxysmal atrial fibrillation: Secondary | ICD-10-CM | POA: Diagnosis not present

## 2023-06-30 DIAGNOSIS — I5032 Chronic diastolic (congestive) heart failure: Secondary | ICD-10-CM | POA: Diagnosis not present

## 2023-06-30 DIAGNOSIS — Z9981 Dependence on supplemental oxygen: Secondary | ICD-10-CM | POA: Diagnosis not present

## 2023-06-30 DIAGNOSIS — I130 Hypertensive heart and chronic kidney disease with heart failure and stage 1 through stage 4 chronic kidney disease, or unspecified chronic kidney disease: Secondary | ICD-10-CM | POA: Diagnosis not present

## 2023-06-30 DIAGNOSIS — L03116 Cellulitis of left lower limb: Secondary | ICD-10-CM | POA: Diagnosis not present

## 2023-06-30 DIAGNOSIS — Z87891 Personal history of nicotine dependence: Secondary | ICD-10-CM | POA: Diagnosis not present

## 2023-06-30 DIAGNOSIS — Z7982 Long term (current) use of aspirin: Secondary | ICD-10-CM | POA: Diagnosis not present

## 2023-06-30 DIAGNOSIS — E785 Hyperlipidemia, unspecified: Secondary | ICD-10-CM | POA: Diagnosis not present

## 2023-06-30 DIAGNOSIS — E1122 Type 2 diabetes mellitus with diabetic chronic kidney disease: Secondary | ICD-10-CM | POA: Diagnosis not present

## 2023-06-30 DIAGNOSIS — Z556 Problems related to health literacy: Secondary | ICD-10-CM | POA: Diagnosis not present

## 2023-06-30 DIAGNOSIS — Z9181 History of falling: Secondary | ICD-10-CM | POA: Diagnosis not present

## 2023-06-30 DIAGNOSIS — I2510 Atherosclerotic heart disease of native coronary artery without angina pectoris: Secondary | ICD-10-CM | POA: Diagnosis not present

## 2023-06-30 DIAGNOSIS — E538 Deficiency of other specified B group vitamins: Secondary | ICD-10-CM | POA: Diagnosis not present

## 2023-06-30 DIAGNOSIS — N183 Chronic kidney disease, stage 3 unspecified: Secondary | ICD-10-CM | POA: Diagnosis not present

## 2023-06-30 DIAGNOSIS — G4733 Obstructive sleep apnea (adult) (pediatric): Secondary | ICD-10-CM | POA: Diagnosis not present

## 2023-06-30 DIAGNOSIS — Z85828 Personal history of other malignant neoplasm of skin: Secondary | ICD-10-CM | POA: Diagnosis not present

## 2023-06-30 DIAGNOSIS — D509 Iron deficiency anemia, unspecified: Secondary | ICD-10-CM | POA: Diagnosis not present

## 2023-06-30 DIAGNOSIS — J449 Chronic obstructive pulmonary disease, unspecified: Secondary | ICD-10-CM | POA: Diagnosis not present

## 2023-06-30 DIAGNOSIS — D631 Anemia in chronic kidney disease: Secondary | ICD-10-CM | POA: Diagnosis not present

## 2023-07-01 DIAGNOSIS — N183 Chronic kidney disease, stage 3 unspecified: Secondary | ICD-10-CM | POA: Diagnosis not present

## 2023-07-01 DIAGNOSIS — G4733 Obstructive sleep apnea (adult) (pediatric): Secondary | ICD-10-CM | POA: Diagnosis not present

## 2023-07-01 DIAGNOSIS — E785 Hyperlipidemia, unspecified: Secondary | ICD-10-CM | POA: Diagnosis not present

## 2023-07-01 DIAGNOSIS — Z556 Problems related to health literacy: Secondary | ICD-10-CM | POA: Diagnosis not present

## 2023-07-01 DIAGNOSIS — I2510 Atherosclerotic heart disease of native coronary artery without angina pectoris: Secondary | ICD-10-CM | POA: Diagnosis not present

## 2023-07-01 DIAGNOSIS — I130 Hypertensive heart and chronic kidney disease with heart failure and stage 1 through stage 4 chronic kidney disease, or unspecified chronic kidney disease: Secondary | ICD-10-CM | POA: Diagnosis not present

## 2023-07-01 DIAGNOSIS — D509 Iron deficiency anemia, unspecified: Secondary | ICD-10-CM | POA: Diagnosis not present

## 2023-07-01 DIAGNOSIS — Z9181 History of falling: Secondary | ICD-10-CM | POA: Diagnosis not present

## 2023-07-01 DIAGNOSIS — Z85828 Personal history of other malignant neoplasm of skin: Secondary | ICD-10-CM | POA: Diagnosis not present

## 2023-07-01 DIAGNOSIS — I480 Paroxysmal atrial fibrillation: Secondary | ICD-10-CM | POA: Diagnosis not present

## 2023-07-01 DIAGNOSIS — Z7982 Long term (current) use of aspirin: Secondary | ICD-10-CM | POA: Diagnosis not present

## 2023-07-01 DIAGNOSIS — Z9981 Dependence on supplemental oxygen: Secondary | ICD-10-CM | POA: Diagnosis not present

## 2023-07-01 DIAGNOSIS — Z87891 Personal history of nicotine dependence: Secondary | ICD-10-CM | POA: Diagnosis not present

## 2023-07-01 DIAGNOSIS — I5032 Chronic diastolic (congestive) heart failure: Secondary | ICD-10-CM | POA: Diagnosis not present

## 2023-07-01 DIAGNOSIS — E1122 Type 2 diabetes mellitus with diabetic chronic kidney disease: Secondary | ICD-10-CM | POA: Diagnosis not present

## 2023-07-01 DIAGNOSIS — D631 Anemia in chronic kidney disease: Secondary | ICD-10-CM | POA: Diagnosis not present

## 2023-07-01 DIAGNOSIS — J449 Chronic obstructive pulmonary disease, unspecified: Secondary | ICD-10-CM | POA: Diagnosis not present

## 2023-07-01 DIAGNOSIS — L03116 Cellulitis of left lower limb: Secondary | ICD-10-CM | POA: Diagnosis not present

## 2023-07-01 DIAGNOSIS — E538 Deficiency of other specified B group vitamins: Secondary | ICD-10-CM | POA: Diagnosis not present

## 2023-07-02 DIAGNOSIS — Z9981 Dependence on supplemental oxygen: Secondary | ICD-10-CM | POA: Diagnosis not present

## 2023-07-02 DIAGNOSIS — Z7982 Long term (current) use of aspirin: Secondary | ICD-10-CM | POA: Diagnosis not present

## 2023-07-02 DIAGNOSIS — N183 Chronic kidney disease, stage 3 unspecified: Secondary | ICD-10-CM | POA: Diagnosis not present

## 2023-07-02 DIAGNOSIS — Z87891 Personal history of nicotine dependence: Secondary | ICD-10-CM | POA: Diagnosis not present

## 2023-07-02 DIAGNOSIS — D509 Iron deficiency anemia, unspecified: Secondary | ICD-10-CM | POA: Diagnosis not present

## 2023-07-02 DIAGNOSIS — E538 Deficiency of other specified B group vitamins: Secondary | ICD-10-CM | POA: Diagnosis not present

## 2023-07-02 DIAGNOSIS — E1122 Type 2 diabetes mellitus with diabetic chronic kidney disease: Secondary | ICD-10-CM | POA: Diagnosis not present

## 2023-07-02 DIAGNOSIS — D631 Anemia in chronic kidney disease: Secondary | ICD-10-CM | POA: Diagnosis not present

## 2023-07-02 DIAGNOSIS — I480 Paroxysmal atrial fibrillation: Secondary | ICD-10-CM | POA: Diagnosis not present

## 2023-07-02 DIAGNOSIS — L03116 Cellulitis of left lower limb: Secondary | ICD-10-CM | POA: Diagnosis not present

## 2023-07-02 DIAGNOSIS — E785 Hyperlipidemia, unspecified: Secondary | ICD-10-CM | POA: Diagnosis not present

## 2023-07-02 DIAGNOSIS — I2510 Atherosclerotic heart disease of native coronary artery without angina pectoris: Secondary | ICD-10-CM | POA: Diagnosis not present

## 2023-07-02 DIAGNOSIS — I130 Hypertensive heart and chronic kidney disease with heart failure and stage 1 through stage 4 chronic kidney disease, or unspecified chronic kidney disease: Secondary | ICD-10-CM | POA: Diagnosis not present

## 2023-07-02 DIAGNOSIS — Z556 Problems related to health literacy: Secondary | ICD-10-CM | POA: Diagnosis not present

## 2023-07-02 DIAGNOSIS — I5032 Chronic diastolic (congestive) heart failure: Secondary | ICD-10-CM | POA: Diagnosis not present

## 2023-07-02 DIAGNOSIS — J449 Chronic obstructive pulmonary disease, unspecified: Secondary | ICD-10-CM | POA: Diagnosis not present

## 2023-07-02 DIAGNOSIS — Z85828 Personal history of other malignant neoplasm of skin: Secondary | ICD-10-CM | POA: Diagnosis not present

## 2023-07-02 DIAGNOSIS — Z9181 History of falling: Secondary | ICD-10-CM | POA: Diagnosis not present

## 2023-07-02 DIAGNOSIS — G4733 Obstructive sleep apnea (adult) (pediatric): Secondary | ICD-10-CM | POA: Diagnosis not present

## 2023-07-03 DIAGNOSIS — Z556 Problems related to health literacy: Secondary | ICD-10-CM | POA: Diagnosis not present

## 2023-07-03 DIAGNOSIS — E538 Deficiency of other specified B group vitamins: Secondary | ICD-10-CM | POA: Diagnosis not present

## 2023-07-03 DIAGNOSIS — D509 Iron deficiency anemia, unspecified: Secondary | ICD-10-CM | POA: Diagnosis not present

## 2023-07-03 DIAGNOSIS — Z9981 Dependence on supplemental oxygen: Secondary | ICD-10-CM | POA: Diagnosis not present

## 2023-07-03 DIAGNOSIS — Z7982 Long term (current) use of aspirin: Secondary | ICD-10-CM | POA: Diagnosis not present

## 2023-07-03 DIAGNOSIS — E1122 Type 2 diabetes mellitus with diabetic chronic kidney disease: Secondary | ICD-10-CM | POA: Diagnosis not present

## 2023-07-03 DIAGNOSIS — I130 Hypertensive heart and chronic kidney disease with heart failure and stage 1 through stage 4 chronic kidney disease, or unspecified chronic kidney disease: Secondary | ICD-10-CM | POA: Diagnosis not present

## 2023-07-03 DIAGNOSIS — Z85828 Personal history of other malignant neoplasm of skin: Secondary | ICD-10-CM | POA: Diagnosis not present

## 2023-07-03 DIAGNOSIS — J449 Chronic obstructive pulmonary disease, unspecified: Secondary | ICD-10-CM | POA: Diagnosis not present

## 2023-07-03 DIAGNOSIS — Z87891 Personal history of nicotine dependence: Secondary | ICD-10-CM | POA: Diagnosis not present

## 2023-07-03 DIAGNOSIS — I2510 Atherosclerotic heart disease of native coronary artery without angina pectoris: Secondary | ICD-10-CM | POA: Diagnosis not present

## 2023-07-03 DIAGNOSIS — G4733 Obstructive sleep apnea (adult) (pediatric): Secondary | ICD-10-CM | POA: Diagnosis not present

## 2023-07-03 DIAGNOSIS — N183 Chronic kidney disease, stage 3 unspecified: Secondary | ICD-10-CM | POA: Diagnosis not present

## 2023-07-03 DIAGNOSIS — I5032 Chronic diastolic (congestive) heart failure: Secondary | ICD-10-CM | POA: Diagnosis not present

## 2023-07-03 DIAGNOSIS — D631 Anemia in chronic kidney disease: Secondary | ICD-10-CM | POA: Diagnosis not present

## 2023-07-03 DIAGNOSIS — L03116 Cellulitis of left lower limb: Secondary | ICD-10-CM | POA: Diagnosis not present

## 2023-07-03 DIAGNOSIS — I480 Paroxysmal atrial fibrillation: Secondary | ICD-10-CM | POA: Diagnosis not present

## 2023-07-03 DIAGNOSIS — Z9181 History of falling: Secondary | ICD-10-CM | POA: Diagnosis not present

## 2023-07-03 DIAGNOSIS — E785 Hyperlipidemia, unspecified: Secondary | ICD-10-CM | POA: Diagnosis not present

## 2023-07-05 ENCOUNTER — Other Ambulatory Visit: Payer: Self-pay | Admitting: Family

## 2023-07-06 DIAGNOSIS — E538 Deficiency of other specified B group vitamins: Secondary | ICD-10-CM | POA: Diagnosis not present

## 2023-07-06 DIAGNOSIS — E785 Hyperlipidemia, unspecified: Secondary | ICD-10-CM | POA: Diagnosis not present

## 2023-07-06 DIAGNOSIS — I480 Paroxysmal atrial fibrillation: Secondary | ICD-10-CM | POA: Diagnosis not present

## 2023-07-06 DIAGNOSIS — G4733 Obstructive sleep apnea (adult) (pediatric): Secondary | ICD-10-CM | POA: Diagnosis not present

## 2023-07-06 DIAGNOSIS — E1122 Type 2 diabetes mellitus with diabetic chronic kidney disease: Secondary | ICD-10-CM | POA: Diagnosis not present

## 2023-07-06 DIAGNOSIS — D631 Anemia in chronic kidney disease: Secondary | ICD-10-CM | POA: Diagnosis not present

## 2023-07-06 DIAGNOSIS — Z9181 History of falling: Secondary | ICD-10-CM | POA: Diagnosis not present

## 2023-07-06 DIAGNOSIS — D509 Iron deficiency anemia, unspecified: Secondary | ICD-10-CM | POA: Diagnosis not present

## 2023-07-06 DIAGNOSIS — I5032 Chronic diastolic (congestive) heart failure: Secondary | ICD-10-CM | POA: Diagnosis not present

## 2023-07-06 DIAGNOSIS — Z87891 Personal history of nicotine dependence: Secondary | ICD-10-CM | POA: Diagnosis not present

## 2023-07-06 DIAGNOSIS — Z556 Problems related to health literacy: Secondary | ICD-10-CM | POA: Diagnosis not present

## 2023-07-06 DIAGNOSIS — I2510 Atherosclerotic heart disease of native coronary artery without angina pectoris: Secondary | ICD-10-CM | POA: Diagnosis not present

## 2023-07-06 DIAGNOSIS — Z85828 Personal history of other malignant neoplasm of skin: Secondary | ICD-10-CM | POA: Diagnosis not present

## 2023-07-06 DIAGNOSIS — J449 Chronic obstructive pulmonary disease, unspecified: Secondary | ICD-10-CM | POA: Diagnosis not present

## 2023-07-06 DIAGNOSIS — N183 Chronic kidney disease, stage 3 unspecified: Secondary | ICD-10-CM | POA: Diagnosis not present

## 2023-07-06 DIAGNOSIS — L03116 Cellulitis of left lower limb: Secondary | ICD-10-CM | POA: Diagnosis not present

## 2023-07-06 DIAGNOSIS — I130 Hypertensive heart and chronic kidney disease with heart failure and stage 1 through stage 4 chronic kidney disease, or unspecified chronic kidney disease: Secondary | ICD-10-CM | POA: Diagnosis not present

## 2023-07-06 DIAGNOSIS — Z9981 Dependence on supplemental oxygen: Secondary | ICD-10-CM | POA: Diagnosis not present

## 2023-07-06 DIAGNOSIS — Z7982 Long term (current) use of aspirin: Secondary | ICD-10-CM | POA: Diagnosis not present

## 2023-07-08 DIAGNOSIS — I130 Hypertensive heart and chronic kidney disease with heart failure and stage 1 through stage 4 chronic kidney disease, or unspecified chronic kidney disease: Secondary | ICD-10-CM | POA: Diagnosis not present

## 2023-07-08 DIAGNOSIS — Z9181 History of falling: Secondary | ICD-10-CM | POA: Diagnosis not present

## 2023-07-08 DIAGNOSIS — D631 Anemia in chronic kidney disease: Secondary | ICD-10-CM | POA: Diagnosis not present

## 2023-07-08 DIAGNOSIS — I480 Paroxysmal atrial fibrillation: Secondary | ICD-10-CM | POA: Diagnosis not present

## 2023-07-08 DIAGNOSIS — E538 Deficiency of other specified B group vitamins: Secondary | ICD-10-CM | POA: Diagnosis not present

## 2023-07-08 DIAGNOSIS — G4733 Obstructive sleep apnea (adult) (pediatric): Secondary | ICD-10-CM | POA: Diagnosis not present

## 2023-07-08 DIAGNOSIS — L03116 Cellulitis of left lower limb: Secondary | ICD-10-CM | POA: Diagnosis not present

## 2023-07-08 DIAGNOSIS — E1122 Type 2 diabetes mellitus with diabetic chronic kidney disease: Secondary | ICD-10-CM | POA: Diagnosis not present

## 2023-07-08 DIAGNOSIS — Z7982 Long term (current) use of aspirin: Secondary | ICD-10-CM | POA: Diagnosis not present

## 2023-07-08 DIAGNOSIS — Z85828 Personal history of other malignant neoplasm of skin: Secondary | ICD-10-CM | POA: Diagnosis not present

## 2023-07-08 DIAGNOSIS — E785 Hyperlipidemia, unspecified: Secondary | ICD-10-CM | POA: Diagnosis not present

## 2023-07-08 DIAGNOSIS — Z9981 Dependence on supplemental oxygen: Secondary | ICD-10-CM | POA: Diagnosis not present

## 2023-07-08 DIAGNOSIS — I2510 Atherosclerotic heart disease of native coronary artery without angina pectoris: Secondary | ICD-10-CM | POA: Diagnosis not present

## 2023-07-08 DIAGNOSIS — N183 Chronic kidney disease, stage 3 unspecified: Secondary | ICD-10-CM | POA: Diagnosis not present

## 2023-07-08 DIAGNOSIS — Z556 Problems related to health literacy: Secondary | ICD-10-CM | POA: Diagnosis not present

## 2023-07-08 DIAGNOSIS — I5032 Chronic diastolic (congestive) heart failure: Secondary | ICD-10-CM | POA: Diagnosis not present

## 2023-07-08 DIAGNOSIS — D509 Iron deficiency anemia, unspecified: Secondary | ICD-10-CM | POA: Diagnosis not present

## 2023-07-08 DIAGNOSIS — J449 Chronic obstructive pulmonary disease, unspecified: Secondary | ICD-10-CM | POA: Diagnosis not present

## 2023-07-08 DIAGNOSIS — Z87891 Personal history of nicotine dependence: Secondary | ICD-10-CM | POA: Diagnosis not present

## 2023-07-09 DIAGNOSIS — Z7982 Long term (current) use of aspirin: Secondary | ICD-10-CM | POA: Diagnosis not present

## 2023-07-09 DIAGNOSIS — E538 Deficiency of other specified B group vitamins: Secondary | ICD-10-CM | POA: Diagnosis not present

## 2023-07-09 DIAGNOSIS — L03116 Cellulitis of left lower limb: Secondary | ICD-10-CM | POA: Diagnosis not present

## 2023-07-09 DIAGNOSIS — Z9981 Dependence on supplemental oxygen: Secondary | ICD-10-CM | POA: Diagnosis not present

## 2023-07-09 DIAGNOSIS — I480 Paroxysmal atrial fibrillation: Secondary | ICD-10-CM | POA: Diagnosis not present

## 2023-07-09 DIAGNOSIS — I130 Hypertensive heart and chronic kidney disease with heart failure and stage 1 through stage 4 chronic kidney disease, or unspecified chronic kidney disease: Secondary | ICD-10-CM | POA: Diagnosis not present

## 2023-07-09 DIAGNOSIS — I5032 Chronic diastolic (congestive) heart failure: Secondary | ICD-10-CM | POA: Diagnosis not present

## 2023-07-09 DIAGNOSIS — Z556 Problems related to health literacy: Secondary | ICD-10-CM | POA: Diagnosis not present

## 2023-07-09 DIAGNOSIS — N183 Chronic kidney disease, stage 3 unspecified: Secondary | ICD-10-CM | POA: Diagnosis not present

## 2023-07-09 DIAGNOSIS — D631 Anemia in chronic kidney disease: Secondary | ICD-10-CM | POA: Diagnosis not present

## 2023-07-09 DIAGNOSIS — D509 Iron deficiency anemia, unspecified: Secondary | ICD-10-CM | POA: Diagnosis not present

## 2023-07-09 DIAGNOSIS — Z9181 History of falling: Secondary | ICD-10-CM | POA: Diagnosis not present

## 2023-07-09 DIAGNOSIS — Z85828 Personal history of other malignant neoplasm of skin: Secondary | ICD-10-CM | POA: Diagnosis not present

## 2023-07-09 DIAGNOSIS — J449 Chronic obstructive pulmonary disease, unspecified: Secondary | ICD-10-CM | POA: Diagnosis not present

## 2023-07-09 DIAGNOSIS — Z87891 Personal history of nicotine dependence: Secondary | ICD-10-CM | POA: Diagnosis not present

## 2023-07-09 DIAGNOSIS — G4733 Obstructive sleep apnea (adult) (pediatric): Secondary | ICD-10-CM | POA: Diagnosis not present

## 2023-07-09 DIAGNOSIS — I2510 Atherosclerotic heart disease of native coronary artery without angina pectoris: Secondary | ICD-10-CM | POA: Diagnosis not present

## 2023-07-09 DIAGNOSIS — E785 Hyperlipidemia, unspecified: Secondary | ICD-10-CM | POA: Diagnosis not present

## 2023-07-09 DIAGNOSIS — E1122 Type 2 diabetes mellitus with diabetic chronic kidney disease: Secondary | ICD-10-CM | POA: Diagnosis not present

## 2023-07-15 DIAGNOSIS — N183 Chronic kidney disease, stage 3 unspecified: Secondary | ICD-10-CM | POA: Diagnosis not present

## 2023-07-15 DIAGNOSIS — G4733 Obstructive sleep apnea (adult) (pediatric): Secondary | ICD-10-CM | POA: Diagnosis not present

## 2023-07-15 DIAGNOSIS — Z556 Problems related to health literacy: Secondary | ICD-10-CM | POA: Diagnosis not present

## 2023-07-15 DIAGNOSIS — D631 Anemia in chronic kidney disease: Secondary | ICD-10-CM | POA: Diagnosis not present

## 2023-07-15 DIAGNOSIS — E785 Hyperlipidemia, unspecified: Secondary | ICD-10-CM | POA: Diagnosis not present

## 2023-07-15 DIAGNOSIS — D509 Iron deficiency anemia, unspecified: Secondary | ICD-10-CM | POA: Diagnosis not present

## 2023-07-15 DIAGNOSIS — J449 Chronic obstructive pulmonary disease, unspecified: Secondary | ICD-10-CM | POA: Diagnosis not present

## 2023-07-15 DIAGNOSIS — E538 Deficiency of other specified B group vitamins: Secondary | ICD-10-CM | POA: Diagnosis not present

## 2023-07-15 DIAGNOSIS — I2510 Atherosclerotic heart disease of native coronary artery without angina pectoris: Secondary | ICD-10-CM | POA: Diagnosis not present

## 2023-07-15 DIAGNOSIS — Z85828 Personal history of other malignant neoplasm of skin: Secondary | ICD-10-CM | POA: Diagnosis not present

## 2023-07-15 DIAGNOSIS — Z9981 Dependence on supplemental oxygen: Secondary | ICD-10-CM | POA: Diagnosis not present

## 2023-07-15 DIAGNOSIS — Z9181 History of falling: Secondary | ICD-10-CM | POA: Diagnosis not present

## 2023-07-15 DIAGNOSIS — Z7982 Long term (current) use of aspirin: Secondary | ICD-10-CM | POA: Diagnosis not present

## 2023-07-15 DIAGNOSIS — Z87891 Personal history of nicotine dependence: Secondary | ICD-10-CM | POA: Diagnosis not present

## 2023-07-15 DIAGNOSIS — L03116 Cellulitis of left lower limb: Secondary | ICD-10-CM | POA: Diagnosis not present

## 2023-07-15 DIAGNOSIS — I130 Hypertensive heart and chronic kidney disease with heart failure and stage 1 through stage 4 chronic kidney disease, or unspecified chronic kidney disease: Secondary | ICD-10-CM | POA: Diagnosis not present

## 2023-07-15 DIAGNOSIS — E1122 Type 2 diabetes mellitus with diabetic chronic kidney disease: Secondary | ICD-10-CM | POA: Diagnosis not present

## 2023-07-15 DIAGNOSIS — I5032 Chronic diastolic (congestive) heart failure: Secondary | ICD-10-CM | POA: Diagnosis not present

## 2023-07-15 DIAGNOSIS — I480 Paroxysmal atrial fibrillation: Secondary | ICD-10-CM | POA: Diagnosis not present

## 2023-07-16 DIAGNOSIS — Z556 Problems related to health literacy: Secondary | ICD-10-CM | POA: Diagnosis not present

## 2023-07-16 DIAGNOSIS — D509 Iron deficiency anemia, unspecified: Secondary | ICD-10-CM | POA: Diagnosis not present

## 2023-07-16 DIAGNOSIS — E1122 Type 2 diabetes mellitus with diabetic chronic kidney disease: Secondary | ICD-10-CM | POA: Diagnosis not present

## 2023-07-16 DIAGNOSIS — Z9181 History of falling: Secondary | ICD-10-CM | POA: Diagnosis not present

## 2023-07-16 DIAGNOSIS — D631 Anemia in chronic kidney disease: Secondary | ICD-10-CM | POA: Diagnosis not present

## 2023-07-16 DIAGNOSIS — Z9981 Dependence on supplemental oxygen: Secondary | ICD-10-CM | POA: Diagnosis not present

## 2023-07-16 DIAGNOSIS — Z7982 Long term (current) use of aspirin: Secondary | ICD-10-CM | POA: Diagnosis not present

## 2023-07-16 DIAGNOSIS — E538 Deficiency of other specified B group vitamins: Secondary | ICD-10-CM | POA: Diagnosis not present

## 2023-07-16 DIAGNOSIS — E785 Hyperlipidemia, unspecified: Secondary | ICD-10-CM | POA: Diagnosis not present

## 2023-07-16 DIAGNOSIS — Z85828 Personal history of other malignant neoplasm of skin: Secondary | ICD-10-CM | POA: Diagnosis not present

## 2023-07-16 DIAGNOSIS — I5032 Chronic diastolic (congestive) heart failure: Secondary | ICD-10-CM | POA: Diagnosis not present

## 2023-07-16 DIAGNOSIS — N183 Chronic kidney disease, stage 3 unspecified: Secondary | ICD-10-CM | POA: Diagnosis not present

## 2023-07-16 DIAGNOSIS — G4733 Obstructive sleep apnea (adult) (pediatric): Secondary | ICD-10-CM | POA: Diagnosis not present

## 2023-07-16 DIAGNOSIS — I480 Paroxysmal atrial fibrillation: Secondary | ICD-10-CM | POA: Diagnosis not present

## 2023-07-16 DIAGNOSIS — J449 Chronic obstructive pulmonary disease, unspecified: Secondary | ICD-10-CM | POA: Diagnosis not present

## 2023-07-16 DIAGNOSIS — I130 Hypertensive heart and chronic kidney disease with heart failure and stage 1 through stage 4 chronic kidney disease, or unspecified chronic kidney disease: Secondary | ICD-10-CM | POA: Diagnosis not present

## 2023-07-16 DIAGNOSIS — L03116 Cellulitis of left lower limb: Secondary | ICD-10-CM | POA: Diagnosis not present

## 2023-07-16 DIAGNOSIS — I2510 Atherosclerotic heart disease of native coronary artery without angina pectoris: Secondary | ICD-10-CM | POA: Diagnosis not present

## 2023-07-16 DIAGNOSIS — Z87891 Personal history of nicotine dependence: Secondary | ICD-10-CM | POA: Diagnosis not present

## 2023-07-21 DIAGNOSIS — I130 Hypertensive heart and chronic kidney disease with heart failure and stage 1 through stage 4 chronic kidney disease, or unspecified chronic kidney disease: Secondary | ICD-10-CM | POA: Diagnosis not present

## 2023-07-21 DIAGNOSIS — D631 Anemia in chronic kidney disease: Secondary | ICD-10-CM | POA: Diagnosis not present

## 2023-07-21 DIAGNOSIS — Z85828 Personal history of other malignant neoplasm of skin: Secondary | ICD-10-CM | POA: Diagnosis not present

## 2023-07-21 DIAGNOSIS — Z9181 History of falling: Secondary | ICD-10-CM | POA: Diagnosis not present

## 2023-07-21 DIAGNOSIS — Z9981 Dependence on supplemental oxygen: Secondary | ICD-10-CM | POA: Diagnosis not present

## 2023-07-21 DIAGNOSIS — I5032 Chronic diastolic (congestive) heart failure: Secondary | ICD-10-CM | POA: Diagnosis not present

## 2023-07-21 DIAGNOSIS — E1122 Type 2 diabetes mellitus with diabetic chronic kidney disease: Secondary | ICD-10-CM | POA: Diagnosis not present

## 2023-07-21 DIAGNOSIS — Z87891 Personal history of nicotine dependence: Secondary | ICD-10-CM | POA: Diagnosis not present

## 2023-07-21 DIAGNOSIS — E538 Deficiency of other specified B group vitamins: Secondary | ICD-10-CM | POA: Diagnosis not present

## 2023-07-21 DIAGNOSIS — G4733 Obstructive sleep apnea (adult) (pediatric): Secondary | ICD-10-CM | POA: Diagnosis not present

## 2023-07-21 DIAGNOSIS — I2510 Atherosclerotic heart disease of native coronary artery without angina pectoris: Secondary | ICD-10-CM | POA: Diagnosis not present

## 2023-07-21 DIAGNOSIS — Z7982 Long term (current) use of aspirin: Secondary | ICD-10-CM | POA: Diagnosis not present

## 2023-07-21 DIAGNOSIS — E785 Hyperlipidemia, unspecified: Secondary | ICD-10-CM | POA: Diagnosis not present

## 2023-07-21 DIAGNOSIS — J449 Chronic obstructive pulmonary disease, unspecified: Secondary | ICD-10-CM | POA: Diagnosis not present

## 2023-07-21 DIAGNOSIS — Z556 Problems related to health literacy: Secondary | ICD-10-CM | POA: Diagnosis not present

## 2023-07-21 DIAGNOSIS — L03116 Cellulitis of left lower limb: Secondary | ICD-10-CM | POA: Diagnosis not present

## 2023-07-21 DIAGNOSIS — D509 Iron deficiency anemia, unspecified: Secondary | ICD-10-CM | POA: Diagnosis not present

## 2023-07-21 DIAGNOSIS — N183 Chronic kidney disease, stage 3 unspecified: Secondary | ICD-10-CM | POA: Diagnosis not present

## 2023-07-21 DIAGNOSIS — I480 Paroxysmal atrial fibrillation: Secondary | ICD-10-CM | POA: Diagnosis not present

## 2023-07-26 DIAGNOSIS — G894 Chronic pain syndrome: Secondary | ICD-10-CM | POA: Diagnosis not present

## 2023-07-28 DIAGNOSIS — Z9981 Dependence on supplemental oxygen: Secondary | ICD-10-CM | POA: Diagnosis not present

## 2023-07-28 DIAGNOSIS — I5032 Chronic diastolic (congestive) heart failure: Secondary | ICD-10-CM | POA: Diagnosis not present

## 2023-07-28 DIAGNOSIS — J449 Chronic obstructive pulmonary disease, unspecified: Secondary | ICD-10-CM | POA: Diagnosis not present

## 2023-07-28 DIAGNOSIS — E538 Deficiency of other specified B group vitamins: Secondary | ICD-10-CM | POA: Diagnosis not present

## 2023-07-28 DIAGNOSIS — I130 Hypertensive heart and chronic kidney disease with heart failure and stage 1 through stage 4 chronic kidney disease, or unspecified chronic kidney disease: Secondary | ICD-10-CM | POA: Diagnosis not present

## 2023-07-28 DIAGNOSIS — D509 Iron deficiency anemia, unspecified: Secondary | ICD-10-CM | POA: Diagnosis not present

## 2023-07-28 DIAGNOSIS — N183 Chronic kidney disease, stage 3 unspecified: Secondary | ICD-10-CM | POA: Diagnosis not present

## 2023-07-28 DIAGNOSIS — Z556 Problems related to health literacy: Secondary | ICD-10-CM | POA: Diagnosis not present

## 2023-07-28 DIAGNOSIS — G4733 Obstructive sleep apnea (adult) (pediatric): Secondary | ICD-10-CM | POA: Diagnosis not present

## 2023-07-28 DIAGNOSIS — Z9181 History of falling: Secondary | ICD-10-CM | POA: Diagnosis not present

## 2023-07-28 DIAGNOSIS — Z85828 Personal history of other malignant neoplasm of skin: Secondary | ICD-10-CM | POA: Diagnosis not present

## 2023-07-28 DIAGNOSIS — E1122 Type 2 diabetes mellitus with diabetic chronic kidney disease: Secondary | ICD-10-CM | POA: Diagnosis not present

## 2023-07-28 DIAGNOSIS — I480 Paroxysmal atrial fibrillation: Secondary | ICD-10-CM | POA: Diagnosis not present

## 2023-07-28 DIAGNOSIS — L03116 Cellulitis of left lower limb: Secondary | ICD-10-CM | POA: Diagnosis not present

## 2023-07-28 DIAGNOSIS — E785 Hyperlipidemia, unspecified: Secondary | ICD-10-CM | POA: Diagnosis not present

## 2023-07-28 DIAGNOSIS — I2510 Atherosclerotic heart disease of native coronary artery without angina pectoris: Secondary | ICD-10-CM | POA: Diagnosis not present

## 2023-07-28 DIAGNOSIS — D631 Anemia in chronic kidney disease: Secondary | ICD-10-CM | POA: Diagnosis not present

## 2023-07-28 DIAGNOSIS — Z87891 Personal history of nicotine dependence: Secondary | ICD-10-CM | POA: Diagnosis not present

## 2023-07-28 DIAGNOSIS — Z7982 Long term (current) use of aspirin: Secondary | ICD-10-CM | POA: Diagnosis not present

## 2023-07-31 DIAGNOSIS — E538 Deficiency of other specified B group vitamins: Secondary | ICD-10-CM | POA: Diagnosis not present

## 2023-07-31 DIAGNOSIS — L03116 Cellulitis of left lower limb: Secondary | ICD-10-CM | POA: Diagnosis not present

## 2023-07-31 DIAGNOSIS — G4733 Obstructive sleep apnea (adult) (pediatric): Secondary | ICD-10-CM | POA: Diagnosis not present

## 2023-07-31 DIAGNOSIS — Z556 Problems related to health literacy: Secondary | ICD-10-CM | POA: Diagnosis not present

## 2023-07-31 DIAGNOSIS — J449 Chronic obstructive pulmonary disease, unspecified: Secondary | ICD-10-CM | POA: Diagnosis not present

## 2023-07-31 DIAGNOSIS — Z85828 Personal history of other malignant neoplasm of skin: Secondary | ICD-10-CM | POA: Diagnosis not present

## 2023-07-31 DIAGNOSIS — D631 Anemia in chronic kidney disease: Secondary | ICD-10-CM | POA: Diagnosis not present

## 2023-07-31 DIAGNOSIS — E785 Hyperlipidemia, unspecified: Secondary | ICD-10-CM | POA: Diagnosis not present

## 2023-07-31 DIAGNOSIS — I480 Paroxysmal atrial fibrillation: Secondary | ICD-10-CM | POA: Diagnosis not present

## 2023-07-31 DIAGNOSIS — Z9181 History of falling: Secondary | ICD-10-CM | POA: Diagnosis not present

## 2023-07-31 DIAGNOSIS — Z87891 Personal history of nicotine dependence: Secondary | ICD-10-CM | POA: Diagnosis not present

## 2023-07-31 DIAGNOSIS — D509 Iron deficiency anemia, unspecified: Secondary | ICD-10-CM | POA: Diagnosis not present

## 2023-07-31 DIAGNOSIS — I130 Hypertensive heart and chronic kidney disease with heart failure and stage 1 through stage 4 chronic kidney disease, or unspecified chronic kidney disease: Secondary | ICD-10-CM | POA: Diagnosis not present

## 2023-07-31 DIAGNOSIS — I5032 Chronic diastolic (congestive) heart failure: Secondary | ICD-10-CM | POA: Diagnosis not present

## 2023-07-31 DIAGNOSIS — N183 Chronic kidney disease, stage 3 unspecified: Secondary | ICD-10-CM | POA: Diagnosis not present

## 2023-07-31 DIAGNOSIS — E1122 Type 2 diabetes mellitus with diabetic chronic kidney disease: Secondary | ICD-10-CM | POA: Diagnosis not present

## 2023-07-31 DIAGNOSIS — Z9981 Dependence on supplemental oxygen: Secondary | ICD-10-CM | POA: Diagnosis not present

## 2023-07-31 DIAGNOSIS — I2510 Atherosclerotic heart disease of native coronary artery without angina pectoris: Secondary | ICD-10-CM | POA: Diagnosis not present

## 2023-07-31 DIAGNOSIS — Z7982 Long term (current) use of aspirin: Secondary | ICD-10-CM | POA: Diagnosis not present

## 2023-08-02 DIAGNOSIS — I480 Paroxysmal atrial fibrillation: Secondary | ICD-10-CM | POA: Diagnosis not present

## 2023-08-02 DIAGNOSIS — Z95818 Presence of other cardiac implants and grafts: Secondary | ICD-10-CM | POA: Diagnosis not present

## 2023-08-04 ENCOUNTER — Other Ambulatory Visit: Payer: Self-pay | Admitting: Family

## 2023-08-04 DIAGNOSIS — I5032 Chronic diastolic (congestive) heart failure: Secondary | ICD-10-CM | POA: Diagnosis not present

## 2023-08-04 DIAGNOSIS — E785 Hyperlipidemia, unspecified: Secondary | ICD-10-CM | POA: Diagnosis not present

## 2023-08-04 DIAGNOSIS — D631 Anemia in chronic kidney disease: Secondary | ICD-10-CM | POA: Diagnosis not present

## 2023-08-04 DIAGNOSIS — I130 Hypertensive heart and chronic kidney disease with heart failure and stage 1 through stage 4 chronic kidney disease, or unspecified chronic kidney disease: Secondary | ICD-10-CM | POA: Diagnosis not present

## 2023-08-04 DIAGNOSIS — E1122 Type 2 diabetes mellitus with diabetic chronic kidney disease: Secondary | ICD-10-CM | POA: Diagnosis not present

## 2023-08-04 DIAGNOSIS — Z85828 Personal history of other malignant neoplasm of skin: Secondary | ICD-10-CM | POA: Diagnosis not present

## 2023-08-04 DIAGNOSIS — Z9181 History of falling: Secondary | ICD-10-CM | POA: Diagnosis not present

## 2023-08-04 DIAGNOSIS — I480 Paroxysmal atrial fibrillation: Secondary | ICD-10-CM | POA: Diagnosis not present

## 2023-08-04 DIAGNOSIS — Z556 Problems related to health literacy: Secondary | ICD-10-CM | POA: Diagnosis not present

## 2023-08-04 DIAGNOSIS — Z9981 Dependence on supplemental oxygen: Secondary | ICD-10-CM | POA: Diagnosis not present

## 2023-08-04 DIAGNOSIS — I2510 Atherosclerotic heart disease of native coronary artery without angina pectoris: Secondary | ICD-10-CM | POA: Diagnosis not present

## 2023-08-04 DIAGNOSIS — G4733 Obstructive sleep apnea (adult) (pediatric): Secondary | ICD-10-CM | POA: Diagnosis not present

## 2023-08-04 DIAGNOSIS — N183 Chronic kidney disease, stage 3 unspecified: Secondary | ICD-10-CM | POA: Diagnosis not present

## 2023-08-04 DIAGNOSIS — J449 Chronic obstructive pulmonary disease, unspecified: Secondary | ICD-10-CM | POA: Diagnosis not present

## 2023-08-04 DIAGNOSIS — Z7982 Long term (current) use of aspirin: Secondary | ICD-10-CM | POA: Diagnosis not present

## 2023-08-04 DIAGNOSIS — D509 Iron deficiency anemia, unspecified: Secondary | ICD-10-CM | POA: Diagnosis not present

## 2023-08-04 DIAGNOSIS — E538 Deficiency of other specified B group vitamins: Secondary | ICD-10-CM | POA: Diagnosis not present

## 2023-08-04 DIAGNOSIS — L03116 Cellulitis of left lower limb: Secondary | ICD-10-CM | POA: Diagnosis not present

## 2023-08-04 DIAGNOSIS — Z87891 Personal history of nicotine dependence: Secondary | ICD-10-CM | POA: Diagnosis not present

## 2023-08-05 ENCOUNTER — Other Ambulatory Visit: Payer: Self-pay | Admitting: Family

## 2023-08-05 DIAGNOSIS — I130 Hypertensive heart and chronic kidney disease with heart failure and stage 1 through stage 4 chronic kidney disease, or unspecified chronic kidney disease: Secondary | ICD-10-CM | POA: Diagnosis not present

## 2023-08-05 DIAGNOSIS — Z9181 History of falling: Secondary | ICD-10-CM | POA: Diagnosis not present

## 2023-08-05 DIAGNOSIS — L03116 Cellulitis of left lower limb: Secondary | ICD-10-CM | POA: Diagnosis not present

## 2023-08-05 DIAGNOSIS — E538 Deficiency of other specified B group vitamins: Secondary | ICD-10-CM | POA: Diagnosis not present

## 2023-08-05 DIAGNOSIS — Z87891 Personal history of nicotine dependence: Secondary | ICD-10-CM | POA: Diagnosis not present

## 2023-08-05 DIAGNOSIS — J449 Chronic obstructive pulmonary disease, unspecified: Secondary | ICD-10-CM | POA: Diagnosis not present

## 2023-08-05 DIAGNOSIS — I2510 Atherosclerotic heart disease of native coronary artery without angina pectoris: Secondary | ICD-10-CM | POA: Diagnosis not present

## 2023-08-05 DIAGNOSIS — E785 Hyperlipidemia, unspecified: Secondary | ICD-10-CM | POA: Diagnosis not present

## 2023-08-05 DIAGNOSIS — Z556 Problems related to health literacy: Secondary | ICD-10-CM | POA: Diagnosis not present

## 2023-08-05 DIAGNOSIS — Z9981 Dependence on supplemental oxygen: Secondary | ICD-10-CM | POA: Diagnosis not present

## 2023-08-05 DIAGNOSIS — I5032 Chronic diastolic (congestive) heart failure: Secondary | ICD-10-CM | POA: Diagnosis not present

## 2023-08-05 DIAGNOSIS — E1122 Type 2 diabetes mellitus with diabetic chronic kidney disease: Secondary | ICD-10-CM | POA: Diagnosis not present

## 2023-08-05 DIAGNOSIS — D509 Iron deficiency anemia, unspecified: Secondary | ICD-10-CM | POA: Diagnosis not present

## 2023-08-05 DIAGNOSIS — N183 Chronic kidney disease, stage 3 unspecified: Secondary | ICD-10-CM | POA: Diagnosis not present

## 2023-08-05 DIAGNOSIS — Z7982 Long term (current) use of aspirin: Secondary | ICD-10-CM | POA: Diagnosis not present

## 2023-08-05 DIAGNOSIS — D631 Anemia in chronic kidney disease: Secondary | ICD-10-CM | POA: Diagnosis not present

## 2023-08-05 DIAGNOSIS — G4733 Obstructive sleep apnea (adult) (pediatric): Secondary | ICD-10-CM | POA: Diagnosis not present

## 2023-08-05 DIAGNOSIS — Z85828 Personal history of other malignant neoplasm of skin: Secondary | ICD-10-CM | POA: Diagnosis not present

## 2023-08-05 DIAGNOSIS — I480 Paroxysmal atrial fibrillation: Secondary | ICD-10-CM | POA: Diagnosis not present

## 2023-08-08 DIAGNOSIS — Z556 Problems related to health literacy: Secondary | ICD-10-CM | POA: Diagnosis not present

## 2023-08-08 DIAGNOSIS — E785 Hyperlipidemia, unspecified: Secondary | ICD-10-CM | POA: Diagnosis not present

## 2023-08-08 DIAGNOSIS — Z9981 Dependence on supplemental oxygen: Secondary | ICD-10-CM | POA: Diagnosis not present

## 2023-08-08 DIAGNOSIS — I5032 Chronic diastolic (congestive) heart failure: Secondary | ICD-10-CM | POA: Diagnosis not present

## 2023-08-08 DIAGNOSIS — L03116 Cellulitis of left lower limb: Secondary | ICD-10-CM | POA: Diagnosis not present

## 2023-08-08 DIAGNOSIS — I2510 Atherosclerotic heart disease of native coronary artery without angina pectoris: Secondary | ICD-10-CM | POA: Diagnosis not present

## 2023-08-08 DIAGNOSIS — J449 Chronic obstructive pulmonary disease, unspecified: Secondary | ICD-10-CM | POA: Diagnosis not present

## 2023-08-08 DIAGNOSIS — D509 Iron deficiency anemia, unspecified: Secondary | ICD-10-CM | POA: Diagnosis not present

## 2023-08-08 DIAGNOSIS — Z9181 History of falling: Secondary | ICD-10-CM | POA: Diagnosis not present

## 2023-08-08 DIAGNOSIS — Z87891 Personal history of nicotine dependence: Secondary | ICD-10-CM | POA: Diagnosis not present

## 2023-08-08 DIAGNOSIS — I480 Paroxysmal atrial fibrillation: Secondary | ICD-10-CM | POA: Diagnosis not present

## 2023-08-08 DIAGNOSIS — Z7982 Long term (current) use of aspirin: Secondary | ICD-10-CM | POA: Diagnosis not present

## 2023-08-08 DIAGNOSIS — D631 Anemia in chronic kidney disease: Secondary | ICD-10-CM | POA: Diagnosis not present

## 2023-08-08 DIAGNOSIS — E538 Deficiency of other specified B group vitamins: Secondary | ICD-10-CM | POA: Diagnosis not present

## 2023-08-08 DIAGNOSIS — Z85828 Personal history of other malignant neoplasm of skin: Secondary | ICD-10-CM | POA: Diagnosis not present

## 2023-08-08 DIAGNOSIS — E1122 Type 2 diabetes mellitus with diabetic chronic kidney disease: Secondary | ICD-10-CM | POA: Diagnosis not present

## 2023-08-08 DIAGNOSIS — I130 Hypertensive heart and chronic kidney disease with heart failure and stage 1 through stage 4 chronic kidney disease, or unspecified chronic kidney disease: Secondary | ICD-10-CM | POA: Diagnosis not present

## 2023-08-08 DIAGNOSIS — N183 Chronic kidney disease, stage 3 unspecified: Secondary | ICD-10-CM | POA: Diagnosis not present

## 2023-08-08 DIAGNOSIS — G4733 Obstructive sleep apnea (adult) (pediatric): Secondary | ICD-10-CM | POA: Diagnosis not present

## 2023-08-12 DIAGNOSIS — Z556 Problems related to health literacy: Secondary | ICD-10-CM | POA: Diagnosis not present

## 2023-08-12 DIAGNOSIS — N183 Chronic kidney disease, stage 3 unspecified: Secondary | ICD-10-CM | POA: Diagnosis not present

## 2023-08-12 DIAGNOSIS — Z85828 Personal history of other malignant neoplasm of skin: Secondary | ICD-10-CM | POA: Diagnosis not present

## 2023-08-12 DIAGNOSIS — D631 Anemia in chronic kidney disease: Secondary | ICD-10-CM | POA: Diagnosis not present

## 2023-08-12 DIAGNOSIS — G4733 Obstructive sleep apnea (adult) (pediatric): Secondary | ICD-10-CM | POA: Diagnosis not present

## 2023-08-12 DIAGNOSIS — J449 Chronic obstructive pulmonary disease, unspecified: Secondary | ICD-10-CM | POA: Diagnosis not present

## 2023-08-12 DIAGNOSIS — E785 Hyperlipidemia, unspecified: Secondary | ICD-10-CM | POA: Diagnosis not present

## 2023-08-12 DIAGNOSIS — I130 Hypertensive heart and chronic kidney disease with heart failure and stage 1 through stage 4 chronic kidney disease, or unspecified chronic kidney disease: Secondary | ICD-10-CM | POA: Diagnosis not present

## 2023-08-12 DIAGNOSIS — Z9181 History of falling: Secondary | ICD-10-CM | POA: Diagnosis not present

## 2023-08-12 DIAGNOSIS — I5032 Chronic diastolic (congestive) heart failure: Secondary | ICD-10-CM | POA: Diagnosis not present

## 2023-08-12 DIAGNOSIS — Z7982 Long term (current) use of aspirin: Secondary | ICD-10-CM | POA: Diagnosis not present

## 2023-08-12 DIAGNOSIS — Z9981 Dependence on supplemental oxygen: Secondary | ICD-10-CM | POA: Diagnosis not present

## 2023-08-12 DIAGNOSIS — I2510 Atherosclerotic heart disease of native coronary artery without angina pectoris: Secondary | ICD-10-CM | POA: Diagnosis not present

## 2023-08-12 DIAGNOSIS — Z87891 Personal history of nicotine dependence: Secondary | ICD-10-CM | POA: Diagnosis not present

## 2023-08-12 DIAGNOSIS — E538 Deficiency of other specified B group vitamins: Secondary | ICD-10-CM | POA: Diagnosis not present

## 2023-08-12 DIAGNOSIS — L03116 Cellulitis of left lower limb: Secondary | ICD-10-CM | POA: Diagnosis not present

## 2023-08-12 DIAGNOSIS — D509 Iron deficiency anemia, unspecified: Secondary | ICD-10-CM | POA: Diagnosis not present

## 2023-08-12 DIAGNOSIS — E1122 Type 2 diabetes mellitus with diabetic chronic kidney disease: Secondary | ICD-10-CM | POA: Diagnosis not present

## 2023-08-12 DIAGNOSIS — I480 Paroxysmal atrial fibrillation: Secondary | ICD-10-CM | POA: Diagnosis not present

## 2023-08-14 DIAGNOSIS — G4733 Obstructive sleep apnea (adult) (pediatric): Secondary | ICD-10-CM | POA: Diagnosis not present

## 2023-08-14 DIAGNOSIS — Z7982 Long term (current) use of aspirin: Secondary | ICD-10-CM | POA: Diagnosis not present

## 2023-08-14 DIAGNOSIS — L03116 Cellulitis of left lower limb: Secondary | ICD-10-CM | POA: Diagnosis not present

## 2023-08-14 DIAGNOSIS — Z85828 Personal history of other malignant neoplasm of skin: Secondary | ICD-10-CM | POA: Diagnosis not present

## 2023-08-14 DIAGNOSIS — E538 Deficiency of other specified B group vitamins: Secondary | ICD-10-CM | POA: Diagnosis not present

## 2023-08-14 DIAGNOSIS — E785 Hyperlipidemia, unspecified: Secondary | ICD-10-CM | POA: Diagnosis not present

## 2023-08-14 DIAGNOSIS — I5032 Chronic diastolic (congestive) heart failure: Secondary | ICD-10-CM | POA: Diagnosis not present

## 2023-08-14 DIAGNOSIS — I2510 Atherosclerotic heart disease of native coronary artery without angina pectoris: Secondary | ICD-10-CM | POA: Diagnosis not present

## 2023-08-14 DIAGNOSIS — D631 Anemia in chronic kidney disease: Secondary | ICD-10-CM | POA: Diagnosis not present

## 2023-08-14 DIAGNOSIS — J449 Chronic obstructive pulmonary disease, unspecified: Secondary | ICD-10-CM | POA: Diagnosis not present

## 2023-08-14 DIAGNOSIS — E1122 Type 2 diabetes mellitus with diabetic chronic kidney disease: Secondary | ICD-10-CM | POA: Diagnosis not present

## 2023-08-14 DIAGNOSIS — I130 Hypertensive heart and chronic kidney disease with heart failure and stage 1 through stage 4 chronic kidney disease, or unspecified chronic kidney disease: Secondary | ICD-10-CM | POA: Diagnosis not present

## 2023-08-14 DIAGNOSIS — Z87891 Personal history of nicotine dependence: Secondary | ICD-10-CM | POA: Diagnosis not present

## 2023-08-14 DIAGNOSIS — Z9981 Dependence on supplemental oxygen: Secondary | ICD-10-CM | POA: Diagnosis not present

## 2023-08-14 DIAGNOSIS — Z556 Problems related to health literacy: Secondary | ICD-10-CM | POA: Diagnosis not present

## 2023-08-14 DIAGNOSIS — Z9181 History of falling: Secondary | ICD-10-CM | POA: Diagnosis not present

## 2023-08-14 DIAGNOSIS — I480 Paroxysmal atrial fibrillation: Secondary | ICD-10-CM | POA: Diagnosis not present

## 2023-08-14 DIAGNOSIS — D509 Iron deficiency anemia, unspecified: Secondary | ICD-10-CM | POA: Diagnosis not present

## 2023-08-14 DIAGNOSIS — N183 Chronic kidney disease, stage 3 unspecified: Secondary | ICD-10-CM | POA: Diagnosis not present

## 2023-08-19 DIAGNOSIS — I2510 Atherosclerotic heart disease of native coronary artery without angina pectoris: Secondary | ICD-10-CM | POA: Diagnosis not present

## 2023-08-19 DIAGNOSIS — I130 Hypertensive heart and chronic kidney disease with heart failure and stage 1 through stage 4 chronic kidney disease, or unspecified chronic kidney disease: Secondary | ICD-10-CM | POA: Diagnosis not present

## 2023-08-19 DIAGNOSIS — Z9981 Dependence on supplemental oxygen: Secondary | ICD-10-CM | POA: Diagnosis not present

## 2023-08-19 DIAGNOSIS — D631 Anemia in chronic kidney disease: Secondary | ICD-10-CM | POA: Diagnosis not present

## 2023-08-19 DIAGNOSIS — D539 Nutritional anemia, unspecified: Secondary | ICD-10-CM | POA: Diagnosis not present

## 2023-08-19 DIAGNOSIS — Z87891 Personal history of nicotine dependence: Secondary | ICD-10-CM | POA: Diagnosis not present

## 2023-08-19 DIAGNOSIS — Z556 Problems related to health literacy: Secondary | ICD-10-CM | POA: Diagnosis not present

## 2023-08-19 DIAGNOSIS — I480 Paroxysmal atrial fibrillation: Secondary | ICD-10-CM | POA: Diagnosis not present

## 2023-08-19 DIAGNOSIS — G4733 Obstructive sleep apnea (adult) (pediatric): Secondary | ICD-10-CM | POA: Diagnosis not present

## 2023-08-19 DIAGNOSIS — D509 Iron deficiency anemia, unspecified: Secondary | ICD-10-CM | POA: Diagnosis not present

## 2023-08-19 DIAGNOSIS — N183 Chronic kidney disease, stage 3 unspecified: Secondary | ICD-10-CM | POA: Diagnosis not present

## 2023-08-19 DIAGNOSIS — Z85828 Personal history of other malignant neoplasm of skin: Secondary | ICD-10-CM | POA: Diagnosis not present

## 2023-08-19 DIAGNOSIS — Z79899 Other long term (current) drug therapy: Secondary | ICD-10-CM | POA: Diagnosis not present

## 2023-08-19 DIAGNOSIS — E785 Hyperlipidemia, unspecified: Secondary | ICD-10-CM | POA: Diagnosis not present

## 2023-08-19 DIAGNOSIS — I5032 Chronic diastolic (congestive) heart failure: Secondary | ICD-10-CM | POA: Diagnosis not present

## 2023-08-19 DIAGNOSIS — D696 Thrombocytopenia, unspecified: Secondary | ICD-10-CM | POA: Diagnosis not present

## 2023-08-19 DIAGNOSIS — L89312 Pressure ulcer of right buttock, stage 2: Secondary | ICD-10-CM | POA: Diagnosis not present

## 2023-08-19 DIAGNOSIS — Z7982 Long term (current) use of aspirin: Secondary | ICD-10-CM | POA: Diagnosis not present

## 2023-08-19 DIAGNOSIS — J449 Chronic obstructive pulmonary disease, unspecified: Secondary | ICD-10-CM | POA: Diagnosis not present

## 2023-08-19 DIAGNOSIS — L89322 Pressure ulcer of left buttock, stage 2: Secondary | ICD-10-CM | POA: Diagnosis not present

## 2023-08-19 DIAGNOSIS — E1122 Type 2 diabetes mellitus with diabetic chronic kidney disease: Secondary | ICD-10-CM | POA: Diagnosis not present

## 2023-08-19 DIAGNOSIS — E538 Deficiency of other specified B group vitamins: Secondary | ICD-10-CM | POA: Diagnosis not present

## 2023-08-25 DIAGNOSIS — I5032 Chronic diastolic (congestive) heart failure: Secondary | ICD-10-CM | POA: Diagnosis not present

## 2023-08-25 DIAGNOSIS — Z7982 Long term (current) use of aspirin: Secondary | ICD-10-CM | POA: Diagnosis not present

## 2023-08-25 DIAGNOSIS — Z9981 Dependence on supplemental oxygen: Secondary | ICD-10-CM | POA: Diagnosis not present

## 2023-08-25 DIAGNOSIS — E785 Hyperlipidemia, unspecified: Secondary | ICD-10-CM | POA: Diagnosis not present

## 2023-08-25 DIAGNOSIS — L89322 Pressure ulcer of left buttock, stage 2: Secondary | ICD-10-CM | POA: Diagnosis not present

## 2023-08-25 DIAGNOSIS — Z556 Problems related to health literacy: Secondary | ICD-10-CM | POA: Diagnosis not present

## 2023-08-25 DIAGNOSIS — L89312 Pressure ulcer of right buttock, stage 2: Secondary | ICD-10-CM | POA: Diagnosis not present

## 2023-08-25 DIAGNOSIS — I480 Paroxysmal atrial fibrillation: Secondary | ICD-10-CM | POA: Diagnosis not present

## 2023-08-25 DIAGNOSIS — D539 Nutritional anemia, unspecified: Secondary | ICD-10-CM | POA: Diagnosis not present

## 2023-08-25 DIAGNOSIS — I130 Hypertensive heart and chronic kidney disease with heart failure and stage 1 through stage 4 chronic kidney disease, or unspecified chronic kidney disease: Secondary | ICD-10-CM | POA: Diagnosis not present

## 2023-08-25 DIAGNOSIS — Z87891 Personal history of nicotine dependence: Secondary | ICD-10-CM | POA: Diagnosis not present

## 2023-08-25 DIAGNOSIS — Z79899 Other long term (current) drug therapy: Secondary | ICD-10-CM | POA: Diagnosis not present

## 2023-08-25 DIAGNOSIS — G4733 Obstructive sleep apnea (adult) (pediatric): Secondary | ICD-10-CM | POA: Diagnosis not present

## 2023-08-25 DIAGNOSIS — E1122 Type 2 diabetes mellitus with diabetic chronic kidney disease: Secondary | ICD-10-CM | POA: Diagnosis not present

## 2023-08-25 DIAGNOSIS — I2510 Atherosclerotic heart disease of native coronary artery without angina pectoris: Secondary | ICD-10-CM | POA: Diagnosis not present

## 2023-08-25 DIAGNOSIS — D631 Anemia in chronic kidney disease: Secondary | ICD-10-CM | POA: Diagnosis not present

## 2023-08-25 DIAGNOSIS — J449 Chronic obstructive pulmonary disease, unspecified: Secondary | ICD-10-CM | POA: Diagnosis not present

## 2023-08-25 DIAGNOSIS — N183 Chronic kidney disease, stage 3 unspecified: Secondary | ICD-10-CM | POA: Diagnosis not present

## 2023-08-25 DIAGNOSIS — D509 Iron deficiency anemia, unspecified: Secondary | ICD-10-CM | POA: Diagnosis not present

## 2023-08-25 DIAGNOSIS — D696 Thrombocytopenia, unspecified: Secondary | ICD-10-CM | POA: Diagnosis not present

## 2023-08-25 DIAGNOSIS — E538 Deficiency of other specified B group vitamins: Secondary | ICD-10-CM | POA: Diagnosis not present

## 2023-08-25 DIAGNOSIS — Z85828 Personal history of other malignant neoplasm of skin: Secondary | ICD-10-CM | POA: Diagnosis not present

## 2023-08-26 DIAGNOSIS — G894 Chronic pain syndrome: Secondary | ICD-10-CM | POA: Diagnosis not present

## 2023-08-31 ENCOUNTER — Ambulatory Visit

## 2023-08-31 DIAGNOSIS — I480 Paroxysmal atrial fibrillation: Secondary | ICD-10-CM | POA: Diagnosis not present

## 2023-08-31 DIAGNOSIS — I130 Hypertensive heart and chronic kidney disease with heart failure and stage 1 through stage 4 chronic kidney disease, or unspecified chronic kidney disease: Secondary | ICD-10-CM

## 2023-08-31 DIAGNOSIS — D631 Anemia in chronic kidney disease: Secondary | ICD-10-CM

## 2023-08-31 DIAGNOSIS — L89322 Pressure ulcer of left buttock, stage 2: Secondary | ICD-10-CM

## 2023-08-31 DIAGNOSIS — D696 Thrombocytopenia, unspecified: Secondary | ICD-10-CM

## 2023-08-31 DIAGNOSIS — Z6841 Body Mass Index (BMI) 40.0 and over, adult: Secondary | ICD-10-CM

## 2023-08-31 DIAGNOSIS — N183 Chronic kidney disease, stage 3 unspecified: Secondary | ICD-10-CM

## 2023-08-31 DIAGNOSIS — E1122 Type 2 diabetes mellitus with diabetic chronic kidney disease: Secondary | ICD-10-CM | POA: Diagnosis not present

## 2023-08-31 DIAGNOSIS — L89312 Pressure ulcer of right buttock, stage 2: Secondary | ICD-10-CM | POA: Diagnosis not present

## 2023-08-31 DIAGNOSIS — J449 Chronic obstructive pulmonary disease, unspecified: Secondary | ICD-10-CM | POA: Diagnosis not present

## 2023-08-31 DIAGNOSIS — I5032 Chronic diastolic (congestive) heart failure: Secondary | ICD-10-CM

## 2023-09-02 DIAGNOSIS — G894 Chronic pain syndrome: Secondary | ICD-10-CM | POA: Diagnosis not present

## 2023-09-02 DIAGNOSIS — Z79891 Long term (current) use of opiate analgesic: Secondary | ICD-10-CM | POA: Diagnosis not present

## 2023-09-02 DIAGNOSIS — M51369 Other intervertebral disc degeneration, lumbar region without mention of lumbar back pain or lower extremity pain: Secondary | ICD-10-CM | POA: Diagnosis not present

## 2023-09-02 DIAGNOSIS — Z79899 Other long term (current) drug therapy: Secondary | ICD-10-CM | POA: Diagnosis not present

## 2023-09-03 DIAGNOSIS — Z9981 Dependence on supplemental oxygen: Secondary | ICD-10-CM | POA: Diagnosis not present

## 2023-09-03 DIAGNOSIS — L89322 Pressure ulcer of left buttock, stage 2: Secondary | ICD-10-CM | POA: Diagnosis not present

## 2023-09-03 DIAGNOSIS — E785 Hyperlipidemia, unspecified: Secondary | ICD-10-CM | POA: Diagnosis not present

## 2023-09-03 DIAGNOSIS — I5032 Chronic diastolic (congestive) heart failure: Secondary | ICD-10-CM | POA: Diagnosis not present

## 2023-09-03 DIAGNOSIS — L89312 Pressure ulcer of right buttock, stage 2: Secondary | ICD-10-CM | POA: Diagnosis not present

## 2023-09-03 DIAGNOSIS — I130 Hypertensive heart and chronic kidney disease with heart failure and stage 1 through stage 4 chronic kidney disease, or unspecified chronic kidney disease: Secondary | ICD-10-CM | POA: Diagnosis not present

## 2023-09-03 DIAGNOSIS — Z7982 Long term (current) use of aspirin: Secondary | ICD-10-CM | POA: Diagnosis not present

## 2023-09-03 DIAGNOSIS — D509 Iron deficiency anemia, unspecified: Secondary | ICD-10-CM | POA: Diagnosis not present

## 2023-09-03 DIAGNOSIS — J449 Chronic obstructive pulmonary disease, unspecified: Secondary | ICD-10-CM | POA: Diagnosis not present

## 2023-09-03 DIAGNOSIS — D539 Nutritional anemia, unspecified: Secondary | ICD-10-CM | POA: Diagnosis not present

## 2023-09-03 DIAGNOSIS — I480 Paroxysmal atrial fibrillation: Secondary | ICD-10-CM | POA: Diagnosis not present

## 2023-09-03 DIAGNOSIS — Z79899 Other long term (current) drug therapy: Secondary | ICD-10-CM | POA: Diagnosis not present

## 2023-09-03 DIAGNOSIS — D631 Anemia in chronic kidney disease: Secondary | ICD-10-CM | POA: Diagnosis not present

## 2023-09-03 DIAGNOSIS — Z556 Problems related to health literacy: Secondary | ICD-10-CM | POA: Diagnosis not present

## 2023-09-03 DIAGNOSIS — G4733 Obstructive sleep apnea (adult) (pediatric): Secondary | ICD-10-CM | POA: Diagnosis not present

## 2023-09-03 DIAGNOSIS — E538 Deficiency of other specified B group vitamins: Secondary | ICD-10-CM | POA: Diagnosis not present

## 2023-09-03 DIAGNOSIS — Z87891 Personal history of nicotine dependence: Secondary | ICD-10-CM | POA: Diagnosis not present

## 2023-09-03 DIAGNOSIS — E1122 Type 2 diabetes mellitus with diabetic chronic kidney disease: Secondary | ICD-10-CM | POA: Diagnosis not present

## 2023-09-03 DIAGNOSIS — N183 Chronic kidney disease, stage 3 unspecified: Secondary | ICD-10-CM | POA: Diagnosis not present

## 2023-09-03 DIAGNOSIS — I2510 Atherosclerotic heart disease of native coronary artery without angina pectoris: Secondary | ICD-10-CM | POA: Diagnosis not present

## 2023-09-03 DIAGNOSIS — D696 Thrombocytopenia, unspecified: Secondary | ICD-10-CM | POA: Diagnosis not present

## 2023-09-03 DIAGNOSIS — Z85828 Personal history of other malignant neoplasm of skin: Secondary | ICD-10-CM | POA: Diagnosis not present

## 2023-09-06 DIAGNOSIS — I480 Paroxysmal atrial fibrillation: Secondary | ICD-10-CM | POA: Diagnosis not present

## 2023-09-08 DIAGNOSIS — Z7982 Long term (current) use of aspirin: Secondary | ICD-10-CM | POA: Diagnosis not present

## 2023-09-08 DIAGNOSIS — Z85828 Personal history of other malignant neoplasm of skin: Secondary | ICD-10-CM | POA: Diagnosis not present

## 2023-09-08 DIAGNOSIS — D696 Thrombocytopenia, unspecified: Secondary | ICD-10-CM | POA: Diagnosis not present

## 2023-09-08 DIAGNOSIS — I2510 Atherosclerotic heart disease of native coronary artery without angina pectoris: Secondary | ICD-10-CM | POA: Diagnosis not present

## 2023-09-08 DIAGNOSIS — E785 Hyperlipidemia, unspecified: Secondary | ICD-10-CM | POA: Diagnosis not present

## 2023-09-08 DIAGNOSIS — I480 Paroxysmal atrial fibrillation: Secondary | ICD-10-CM | POA: Diagnosis not present

## 2023-09-08 DIAGNOSIS — Z556 Problems related to health literacy: Secondary | ICD-10-CM | POA: Diagnosis not present

## 2023-09-08 DIAGNOSIS — D631 Anemia in chronic kidney disease: Secondary | ICD-10-CM | POA: Diagnosis not present

## 2023-09-08 DIAGNOSIS — Z9981 Dependence on supplemental oxygen: Secondary | ICD-10-CM | POA: Diagnosis not present

## 2023-09-08 DIAGNOSIS — E1122 Type 2 diabetes mellitus with diabetic chronic kidney disease: Secondary | ICD-10-CM | POA: Diagnosis not present

## 2023-09-08 DIAGNOSIS — G4733 Obstructive sleep apnea (adult) (pediatric): Secondary | ICD-10-CM | POA: Diagnosis not present

## 2023-09-08 DIAGNOSIS — Z79899 Other long term (current) drug therapy: Secondary | ICD-10-CM | POA: Diagnosis not present

## 2023-09-08 DIAGNOSIS — I130 Hypertensive heart and chronic kidney disease with heart failure and stage 1 through stage 4 chronic kidney disease, or unspecified chronic kidney disease: Secondary | ICD-10-CM | POA: Diagnosis not present

## 2023-09-08 DIAGNOSIS — D509 Iron deficiency anemia, unspecified: Secondary | ICD-10-CM | POA: Diagnosis not present

## 2023-09-08 DIAGNOSIS — J449 Chronic obstructive pulmonary disease, unspecified: Secondary | ICD-10-CM | POA: Diagnosis not present

## 2023-09-08 DIAGNOSIS — I5032 Chronic diastolic (congestive) heart failure: Secondary | ICD-10-CM | POA: Diagnosis not present

## 2023-09-08 DIAGNOSIS — N183 Chronic kidney disease, stage 3 unspecified: Secondary | ICD-10-CM | POA: Diagnosis not present

## 2023-09-08 DIAGNOSIS — L89312 Pressure ulcer of right buttock, stage 2: Secondary | ICD-10-CM | POA: Diagnosis not present

## 2023-09-08 DIAGNOSIS — L89322 Pressure ulcer of left buttock, stage 2: Secondary | ICD-10-CM | POA: Diagnosis not present

## 2023-09-08 DIAGNOSIS — E538 Deficiency of other specified B group vitamins: Secondary | ICD-10-CM | POA: Diagnosis not present

## 2023-09-08 DIAGNOSIS — D539 Nutritional anemia, unspecified: Secondary | ICD-10-CM | POA: Diagnosis not present

## 2023-09-08 DIAGNOSIS — Z87891 Personal history of nicotine dependence: Secondary | ICD-10-CM | POA: Diagnosis not present

## 2023-09-15 DIAGNOSIS — D539 Nutritional anemia, unspecified: Secondary | ICD-10-CM | POA: Diagnosis not present

## 2023-09-15 DIAGNOSIS — I480 Paroxysmal atrial fibrillation: Secondary | ICD-10-CM | POA: Diagnosis not present

## 2023-09-15 DIAGNOSIS — J449 Chronic obstructive pulmonary disease, unspecified: Secondary | ICD-10-CM | POA: Diagnosis not present

## 2023-09-15 DIAGNOSIS — Z9981 Dependence on supplemental oxygen: Secondary | ICD-10-CM | POA: Diagnosis not present

## 2023-09-15 DIAGNOSIS — D696 Thrombocytopenia, unspecified: Secondary | ICD-10-CM | POA: Diagnosis not present

## 2023-09-15 DIAGNOSIS — Z87891 Personal history of nicotine dependence: Secondary | ICD-10-CM | POA: Diagnosis not present

## 2023-09-15 DIAGNOSIS — I2510 Atherosclerotic heart disease of native coronary artery without angina pectoris: Secondary | ICD-10-CM | POA: Diagnosis not present

## 2023-09-15 DIAGNOSIS — G4733 Obstructive sleep apnea (adult) (pediatric): Secondary | ICD-10-CM | POA: Diagnosis not present

## 2023-09-15 DIAGNOSIS — E538 Deficiency of other specified B group vitamins: Secondary | ICD-10-CM | POA: Diagnosis not present

## 2023-09-15 DIAGNOSIS — Z556 Problems related to health literacy: Secondary | ICD-10-CM | POA: Diagnosis not present

## 2023-09-15 DIAGNOSIS — Z85828 Personal history of other malignant neoplasm of skin: Secondary | ICD-10-CM | POA: Diagnosis not present

## 2023-09-15 DIAGNOSIS — N183 Chronic kidney disease, stage 3 unspecified: Secondary | ICD-10-CM | POA: Diagnosis not present

## 2023-09-15 DIAGNOSIS — D509 Iron deficiency anemia, unspecified: Secondary | ICD-10-CM | POA: Diagnosis not present

## 2023-09-15 DIAGNOSIS — I5032 Chronic diastolic (congestive) heart failure: Secondary | ICD-10-CM | POA: Diagnosis not present

## 2023-09-15 DIAGNOSIS — E1122 Type 2 diabetes mellitus with diabetic chronic kidney disease: Secondary | ICD-10-CM | POA: Diagnosis not present

## 2023-09-15 DIAGNOSIS — Z7982 Long term (current) use of aspirin: Secondary | ICD-10-CM | POA: Diagnosis not present

## 2023-09-15 DIAGNOSIS — I130 Hypertensive heart and chronic kidney disease with heart failure and stage 1 through stage 4 chronic kidney disease, or unspecified chronic kidney disease: Secondary | ICD-10-CM | POA: Diagnosis not present

## 2023-09-15 DIAGNOSIS — L89312 Pressure ulcer of right buttock, stage 2: Secondary | ICD-10-CM | POA: Diagnosis not present

## 2023-09-15 DIAGNOSIS — Z79899 Other long term (current) drug therapy: Secondary | ICD-10-CM | POA: Diagnosis not present

## 2023-09-15 DIAGNOSIS — D631 Anemia in chronic kidney disease: Secondary | ICD-10-CM | POA: Diagnosis not present

## 2023-09-15 DIAGNOSIS — L89322 Pressure ulcer of left buttock, stage 2: Secondary | ICD-10-CM | POA: Diagnosis not present

## 2023-09-15 DIAGNOSIS — E785 Hyperlipidemia, unspecified: Secondary | ICD-10-CM | POA: Diagnosis not present

## 2023-09-21 DIAGNOSIS — D696 Thrombocytopenia, unspecified: Secondary | ICD-10-CM | POA: Diagnosis not present

## 2023-09-21 DIAGNOSIS — G4733 Obstructive sleep apnea (adult) (pediatric): Secondary | ICD-10-CM | POA: Diagnosis not present

## 2023-09-21 DIAGNOSIS — J449 Chronic obstructive pulmonary disease, unspecified: Secondary | ICD-10-CM | POA: Diagnosis not present

## 2023-09-21 DIAGNOSIS — I480 Paroxysmal atrial fibrillation: Secondary | ICD-10-CM | POA: Diagnosis not present

## 2023-09-21 DIAGNOSIS — L89322 Pressure ulcer of left buttock, stage 2: Secondary | ICD-10-CM | POA: Diagnosis not present

## 2023-09-21 DIAGNOSIS — Z79899 Other long term (current) drug therapy: Secondary | ICD-10-CM | POA: Diagnosis not present

## 2023-09-21 DIAGNOSIS — I130 Hypertensive heart and chronic kidney disease with heart failure and stage 1 through stage 4 chronic kidney disease, or unspecified chronic kidney disease: Secondary | ICD-10-CM | POA: Diagnosis not present

## 2023-09-21 DIAGNOSIS — N183 Chronic kidney disease, stage 3 unspecified: Secondary | ICD-10-CM | POA: Diagnosis not present

## 2023-09-21 DIAGNOSIS — D539 Nutritional anemia, unspecified: Secondary | ICD-10-CM | POA: Diagnosis not present

## 2023-09-21 DIAGNOSIS — I5032 Chronic diastolic (congestive) heart failure: Secondary | ICD-10-CM | POA: Diagnosis not present

## 2023-09-21 DIAGNOSIS — D631 Anemia in chronic kidney disease: Secondary | ICD-10-CM | POA: Diagnosis not present

## 2023-09-21 DIAGNOSIS — Z85828 Personal history of other malignant neoplasm of skin: Secondary | ICD-10-CM | POA: Diagnosis not present

## 2023-09-21 DIAGNOSIS — Z87891 Personal history of nicotine dependence: Secondary | ICD-10-CM | POA: Diagnosis not present

## 2023-09-21 DIAGNOSIS — E1122 Type 2 diabetes mellitus with diabetic chronic kidney disease: Secondary | ICD-10-CM | POA: Diagnosis not present

## 2023-09-21 DIAGNOSIS — E538 Deficiency of other specified B group vitamins: Secondary | ICD-10-CM | POA: Diagnosis not present

## 2023-09-21 DIAGNOSIS — L89312 Pressure ulcer of right buttock, stage 2: Secondary | ICD-10-CM | POA: Diagnosis not present

## 2023-09-21 DIAGNOSIS — Z7982 Long term (current) use of aspirin: Secondary | ICD-10-CM | POA: Diagnosis not present

## 2023-09-21 DIAGNOSIS — Z556 Problems related to health literacy: Secondary | ICD-10-CM | POA: Diagnosis not present

## 2023-09-21 DIAGNOSIS — E785 Hyperlipidemia, unspecified: Secondary | ICD-10-CM | POA: Diagnosis not present

## 2023-09-21 DIAGNOSIS — I2510 Atherosclerotic heart disease of native coronary artery without angina pectoris: Secondary | ICD-10-CM | POA: Diagnosis not present

## 2023-09-21 DIAGNOSIS — Z9981 Dependence on supplemental oxygen: Secondary | ICD-10-CM | POA: Diagnosis not present

## 2023-09-21 DIAGNOSIS — D509 Iron deficiency anemia, unspecified: Secondary | ICD-10-CM | POA: Diagnosis not present

## 2023-09-22 DIAGNOSIS — I4819 Other persistent atrial fibrillation: Secondary | ICD-10-CM | POA: Diagnosis not present

## 2023-09-22 DIAGNOSIS — I10 Essential (primary) hypertension: Secondary | ICD-10-CM | POA: Diagnosis not present

## 2023-09-22 DIAGNOSIS — I480 Paroxysmal atrial fibrillation: Secondary | ICD-10-CM | POA: Diagnosis not present

## 2023-09-25 DIAGNOSIS — G894 Chronic pain syndrome: Secondary | ICD-10-CM | POA: Diagnosis not present

## 2023-09-29 DIAGNOSIS — I480 Paroxysmal atrial fibrillation: Secondary | ICD-10-CM | POA: Diagnosis not present

## 2023-09-29 DIAGNOSIS — I130 Hypertensive heart and chronic kidney disease with heart failure and stage 1 through stage 4 chronic kidney disease, or unspecified chronic kidney disease: Secondary | ICD-10-CM | POA: Diagnosis not present

## 2023-09-29 DIAGNOSIS — L89312 Pressure ulcer of right buttock, stage 2: Secondary | ICD-10-CM | POA: Diagnosis not present

## 2023-09-29 DIAGNOSIS — D539 Nutritional anemia, unspecified: Secondary | ICD-10-CM | POA: Diagnosis not present

## 2023-09-29 DIAGNOSIS — Z85828 Personal history of other malignant neoplasm of skin: Secondary | ICD-10-CM | POA: Diagnosis not present

## 2023-09-29 DIAGNOSIS — Z79899 Other long term (current) drug therapy: Secondary | ICD-10-CM | POA: Diagnosis not present

## 2023-09-29 DIAGNOSIS — J449 Chronic obstructive pulmonary disease, unspecified: Secondary | ICD-10-CM | POA: Diagnosis not present

## 2023-09-29 DIAGNOSIS — Z7982 Long term (current) use of aspirin: Secondary | ICD-10-CM | POA: Diagnosis not present

## 2023-09-29 DIAGNOSIS — Z556 Problems related to health literacy: Secondary | ICD-10-CM | POA: Diagnosis not present

## 2023-09-29 DIAGNOSIS — I5032 Chronic diastolic (congestive) heart failure: Secondary | ICD-10-CM | POA: Diagnosis not present

## 2023-09-29 DIAGNOSIS — L89322 Pressure ulcer of left buttock, stage 2: Secondary | ICD-10-CM | POA: Diagnosis not present

## 2023-09-29 DIAGNOSIS — E1122 Type 2 diabetes mellitus with diabetic chronic kidney disease: Secondary | ICD-10-CM | POA: Diagnosis not present

## 2023-09-29 DIAGNOSIS — I2510 Atherosclerotic heart disease of native coronary artery without angina pectoris: Secondary | ICD-10-CM | POA: Diagnosis not present

## 2023-09-29 DIAGNOSIS — E538 Deficiency of other specified B group vitamins: Secondary | ICD-10-CM | POA: Diagnosis not present

## 2023-09-29 DIAGNOSIS — E785 Hyperlipidemia, unspecified: Secondary | ICD-10-CM | POA: Diagnosis not present

## 2023-09-29 DIAGNOSIS — G4733 Obstructive sleep apnea (adult) (pediatric): Secondary | ICD-10-CM | POA: Diagnosis not present

## 2023-09-29 DIAGNOSIS — Z9981 Dependence on supplemental oxygen: Secondary | ICD-10-CM | POA: Diagnosis not present

## 2023-09-29 DIAGNOSIS — D696 Thrombocytopenia, unspecified: Secondary | ICD-10-CM | POA: Diagnosis not present

## 2023-09-29 DIAGNOSIS — Z87891 Personal history of nicotine dependence: Secondary | ICD-10-CM | POA: Diagnosis not present

## 2023-09-29 DIAGNOSIS — D631 Anemia in chronic kidney disease: Secondary | ICD-10-CM | POA: Diagnosis not present

## 2023-09-29 DIAGNOSIS — D509 Iron deficiency anemia, unspecified: Secondary | ICD-10-CM | POA: Diagnosis not present

## 2023-09-29 DIAGNOSIS — N183 Chronic kidney disease, stage 3 unspecified: Secondary | ICD-10-CM | POA: Diagnosis not present

## 2023-10-05 ENCOUNTER — Other Ambulatory Visit: Payer: Self-pay | Admitting: Family

## 2023-10-05 DIAGNOSIS — R2243 Localized swelling, mass and lump, lower limb, bilateral: Secondary | ICD-10-CM

## 2023-10-05 DIAGNOSIS — I10 Essential (primary) hypertension: Secondary | ICD-10-CM

## 2023-10-05 DIAGNOSIS — F419 Anxiety disorder, unspecified: Secondary | ICD-10-CM

## 2023-10-06 DIAGNOSIS — D631 Anemia in chronic kidney disease: Secondary | ICD-10-CM | POA: Diagnosis not present

## 2023-10-06 DIAGNOSIS — D696 Thrombocytopenia, unspecified: Secondary | ICD-10-CM | POA: Diagnosis not present

## 2023-10-06 DIAGNOSIS — L89322 Pressure ulcer of left buttock, stage 2: Secondary | ICD-10-CM | POA: Diagnosis not present

## 2023-10-06 DIAGNOSIS — Z7982 Long term (current) use of aspirin: Secondary | ICD-10-CM | POA: Diagnosis not present

## 2023-10-06 DIAGNOSIS — Z85828 Personal history of other malignant neoplasm of skin: Secondary | ICD-10-CM | POA: Diagnosis not present

## 2023-10-06 DIAGNOSIS — I2510 Atherosclerotic heart disease of native coronary artery without angina pectoris: Secondary | ICD-10-CM | POA: Diagnosis not present

## 2023-10-06 DIAGNOSIS — I130 Hypertensive heart and chronic kidney disease with heart failure and stage 1 through stage 4 chronic kidney disease, or unspecified chronic kidney disease: Secondary | ICD-10-CM | POA: Diagnosis not present

## 2023-10-06 DIAGNOSIS — E538 Deficiency of other specified B group vitamins: Secondary | ICD-10-CM | POA: Diagnosis not present

## 2023-10-06 DIAGNOSIS — G4733 Obstructive sleep apnea (adult) (pediatric): Secondary | ICD-10-CM | POA: Diagnosis not present

## 2023-10-06 DIAGNOSIS — D509 Iron deficiency anemia, unspecified: Secondary | ICD-10-CM | POA: Diagnosis not present

## 2023-10-06 DIAGNOSIS — N183 Chronic kidney disease, stage 3 unspecified: Secondary | ICD-10-CM | POA: Diagnosis not present

## 2023-10-06 DIAGNOSIS — Z79899 Other long term (current) drug therapy: Secondary | ICD-10-CM | POA: Diagnosis not present

## 2023-10-06 DIAGNOSIS — Z87891 Personal history of nicotine dependence: Secondary | ICD-10-CM | POA: Diagnosis not present

## 2023-10-06 DIAGNOSIS — Z9981 Dependence on supplemental oxygen: Secondary | ICD-10-CM | POA: Diagnosis not present

## 2023-10-06 DIAGNOSIS — J449 Chronic obstructive pulmonary disease, unspecified: Secondary | ICD-10-CM | POA: Diagnosis not present

## 2023-10-06 DIAGNOSIS — D539 Nutritional anemia, unspecified: Secondary | ICD-10-CM | POA: Diagnosis not present

## 2023-10-06 DIAGNOSIS — E1122 Type 2 diabetes mellitus with diabetic chronic kidney disease: Secondary | ICD-10-CM | POA: Diagnosis not present

## 2023-10-06 DIAGNOSIS — E785 Hyperlipidemia, unspecified: Secondary | ICD-10-CM | POA: Diagnosis not present

## 2023-10-06 DIAGNOSIS — L89312 Pressure ulcer of right buttock, stage 2: Secondary | ICD-10-CM | POA: Diagnosis not present

## 2023-10-06 DIAGNOSIS — I5032 Chronic diastolic (congestive) heart failure: Secondary | ICD-10-CM | POA: Diagnosis not present

## 2023-10-06 DIAGNOSIS — I480 Paroxysmal atrial fibrillation: Secondary | ICD-10-CM | POA: Diagnosis not present

## 2023-10-06 DIAGNOSIS — Z556 Problems related to health literacy: Secondary | ICD-10-CM | POA: Diagnosis not present

## 2023-10-09 DIAGNOSIS — M170 Bilateral primary osteoarthritis of knee: Secondary | ICD-10-CM | POA: Diagnosis not present

## 2023-10-11 DIAGNOSIS — Z95818 Presence of other cardiac implants and grafts: Secondary | ICD-10-CM | POA: Diagnosis not present

## 2023-10-11 DIAGNOSIS — I480 Paroxysmal atrial fibrillation: Secondary | ICD-10-CM | POA: Diagnosis not present

## 2023-10-14 DIAGNOSIS — E538 Deficiency of other specified B group vitamins: Secondary | ICD-10-CM | POA: Diagnosis not present

## 2023-10-14 DIAGNOSIS — I5032 Chronic diastolic (congestive) heart failure: Secondary | ICD-10-CM | POA: Diagnosis not present

## 2023-10-14 DIAGNOSIS — I480 Paroxysmal atrial fibrillation: Secondary | ICD-10-CM | POA: Diagnosis not present

## 2023-10-14 DIAGNOSIS — Z9981 Dependence on supplemental oxygen: Secondary | ICD-10-CM | POA: Diagnosis not present

## 2023-10-14 DIAGNOSIS — D509 Iron deficiency anemia, unspecified: Secondary | ICD-10-CM | POA: Diagnosis not present

## 2023-10-14 DIAGNOSIS — L89322 Pressure ulcer of left buttock, stage 2: Secondary | ICD-10-CM | POA: Diagnosis not present

## 2023-10-14 DIAGNOSIS — D696 Thrombocytopenia, unspecified: Secondary | ICD-10-CM | POA: Diagnosis not present

## 2023-10-14 DIAGNOSIS — D539 Nutritional anemia, unspecified: Secondary | ICD-10-CM | POA: Diagnosis not present

## 2023-10-14 DIAGNOSIS — Z79899 Other long term (current) drug therapy: Secondary | ICD-10-CM | POA: Diagnosis not present

## 2023-10-14 DIAGNOSIS — Z85828 Personal history of other malignant neoplasm of skin: Secondary | ICD-10-CM | POA: Diagnosis not present

## 2023-10-14 DIAGNOSIS — G4733 Obstructive sleep apnea (adult) (pediatric): Secondary | ICD-10-CM | POA: Diagnosis not present

## 2023-10-14 DIAGNOSIS — Z7982 Long term (current) use of aspirin: Secondary | ICD-10-CM | POA: Diagnosis not present

## 2023-10-14 DIAGNOSIS — J449 Chronic obstructive pulmonary disease, unspecified: Secondary | ICD-10-CM | POA: Diagnosis not present

## 2023-10-14 DIAGNOSIS — Z556 Problems related to health literacy: Secondary | ICD-10-CM | POA: Diagnosis not present

## 2023-10-14 DIAGNOSIS — E1122 Type 2 diabetes mellitus with diabetic chronic kidney disease: Secondary | ICD-10-CM | POA: Diagnosis not present

## 2023-10-14 DIAGNOSIS — I2510 Atherosclerotic heart disease of native coronary artery without angina pectoris: Secondary | ICD-10-CM | POA: Diagnosis not present

## 2023-10-14 DIAGNOSIS — Z87891 Personal history of nicotine dependence: Secondary | ICD-10-CM | POA: Diagnosis not present

## 2023-10-14 DIAGNOSIS — N183 Chronic kidney disease, stage 3 unspecified: Secondary | ICD-10-CM | POA: Diagnosis not present

## 2023-10-14 DIAGNOSIS — L89312 Pressure ulcer of right buttock, stage 2: Secondary | ICD-10-CM | POA: Diagnosis not present

## 2023-10-14 DIAGNOSIS — I130 Hypertensive heart and chronic kidney disease with heart failure and stage 1 through stage 4 chronic kidney disease, or unspecified chronic kidney disease: Secondary | ICD-10-CM | POA: Diagnosis not present

## 2023-10-14 DIAGNOSIS — E785 Hyperlipidemia, unspecified: Secondary | ICD-10-CM | POA: Diagnosis not present

## 2023-10-14 DIAGNOSIS — D631 Anemia in chronic kidney disease: Secondary | ICD-10-CM | POA: Diagnosis not present

## 2023-10-16 ENCOUNTER — Other Ambulatory Visit: Payer: Self-pay | Admitting: Family

## 2023-10-16 ENCOUNTER — Encounter: Payer: Self-pay | Admitting: Family

## 2023-10-16 DIAGNOSIS — I10 Essential (primary) hypertension: Secondary | ICD-10-CM

## 2023-10-16 NOTE — Telephone Encounter (Signed)
[facility] NTBS in Sept for 6 mos FU RF sent to pharmacy

## 2023-10-16 NOTE — Telephone Encounter (Signed)
 Letter sent.

## 2023-10-20 ENCOUNTER — Other Ambulatory Visit: Payer: Self-pay | Admitting: Family

## 2023-10-20 DIAGNOSIS — M79671 Pain in right foot: Secondary | ICD-10-CM

## 2023-10-21 DIAGNOSIS — Z9981 Dependence on supplemental oxygen: Secondary | ICD-10-CM | POA: Diagnosis not present

## 2023-10-21 DIAGNOSIS — I5032 Chronic diastolic (congestive) heart failure: Secondary | ICD-10-CM | POA: Diagnosis not present

## 2023-10-21 DIAGNOSIS — I2510 Atherosclerotic heart disease of native coronary artery without angina pectoris: Secondary | ICD-10-CM | POA: Diagnosis not present

## 2023-10-21 DIAGNOSIS — N183 Chronic kidney disease, stage 3 unspecified: Secondary | ICD-10-CM | POA: Diagnosis not present

## 2023-10-21 DIAGNOSIS — L89322 Pressure ulcer of left buttock, stage 2: Secondary | ICD-10-CM | POA: Diagnosis not present

## 2023-10-21 DIAGNOSIS — D509 Iron deficiency anemia, unspecified: Secondary | ICD-10-CM | POA: Diagnosis not present

## 2023-10-21 DIAGNOSIS — Z7982 Long term (current) use of aspirin: Secondary | ICD-10-CM | POA: Diagnosis not present

## 2023-10-21 DIAGNOSIS — E785 Hyperlipidemia, unspecified: Secondary | ICD-10-CM | POA: Diagnosis not present

## 2023-10-21 DIAGNOSIS — J449 Chronic obstructive pulmonary disease, unspecified: Secondary | ICD-10-CM | POA: Diagnosis not present

## 2023-10-21 DIAGNOSIS — E1122 Type 2 diabetes mellitus with diabetic chronic kidney disease: Secondary | ICD-10-CM | POA: Diagnosis not present

## 2023-10-21 DIAGNOSIS — D539 Nutritional anemia, unspecified: Secondary | ICD-10-CM | POA: Diagnosis not present

## 2023-10-21 DIAGNOSIS — G4733 Obstructive sleep apnea (adult) (pediatric): Secondary | ICD-10-CM | POA: Diagnosis not present

## 2023-10-21 DIAGNOSIS — I130 Hypertensive heart and chronic kidney disease with heart failure and stage 1 through stage 4 chronic kidney disease, or unspecified chronic kidney disease: Secondary | ICD-10-CM | POA: Diagnosis not present

## 2023-10-21 DIAGNOSIS — I480 Paroxysmal atrial fibrillation: Secondary | ICD-10-CM | POA: Diagnosis not present

## 2023-10-21 DIAGNOSIS — Z87891 Personal history of nicotine dependence: Secondary | ICD-10-CM | POA: Diagnosis not present

## 2023-10-21 DIAGNOSIS — L89312 Pressure ulcer of right buttock, stage 2: Secondary | ICD-10-CM | POA: Diagnosis not present

## 2023-10-21 DIAGNOSIS — D631 Anemia in chronic kidney disease: Secondary | ICD-10-CM | POA: Diagnosis not present

## 2023-10-21 DIAGNOSIS — Z79899 Other long term (current) drug therapy: Secondary | ICD-10-CM | POA: Diagnosis not present

## 2023-10-21 DIAGNOSIS — Z85828 Personal history of other malignant neoplasm of skin: Secondary | ICD-10-CM | POA: Diagnosis not present

## 2023-10-21 DIAGNOSIS — D696 Thrombocytopenia, unspecified: Secondary | ICD-10-CM | POA: Diagnosis not present

## 2023-10-21 DIAGNOSIS — E538 Deficiency of other specified B group vitamins: Secondary | ICD-10-CM | POA: Diagnosis not present

## 2023-10-26 DIAGNOSIS — G894 Chronic pain syndrome: Secondary | ICD-10-CM | POA: Diagnosis not present

## 2023-10-27 DIAGNOSIS — L89312 Pressure ulcer of right buttock, stage 2: Secondary | ICD-10-CM | POA: Diagnosis not present

## 2023-10-27 DIAGNOSIS — G4733 Obstructive sleep apnea (adult) (pediatric): Secondary | ICD-10-CM | POA: Diagnosis not present

## 2023-10-27 DIAGNOSIS — J449 Chronic obstructive pulmonary disease, unspecified: Secondary | ICD-10-CM | POA: Diagnosis not present

## 2023-10-27 DIAGNOSIS — I130 Hypertensive heart and chronic kidney disease with heart failure and stage 1 through stage 4 chronic kidney disease, or unspecified chronic kidney disease: Secondary | ICD-10-CM | POA: Diagnosis not present

## 2023-10-27 DIAGNOSIS — Z85828 Personal history of other malignant neoplasm of skin: Secondary | ICD-10-CM | POA: Diagnosis not present

## 2023-10-27 DIAGNOSIS — L89322 Pressure ulcer of left buttock, stage 2: Secondary | ICD-10-CM | POA: Diagnosis not present

## 2023-10-27 DIAGNOSIS — Z7982 Long term (current) use of aspirin: Secondary | ICD-10-CM | POA: Diagnosis not present

## 2023-10-27 DIAGNOSIS — Z9981 Dependence on supplemental oxygen: Secondary | ICD-10-CM | POA: Diagnosis not present

## 2023-10-27 DIAGNOSIS — N183 Chronic kidney disease, stage 3 unspecified: Secondary | ICD-10-CM | POA: Diagnosis not present

## 2023-10-27 DIAGNOSIS — Z79899 Other long term (current) drug therapy: Secondary | ICD-10-CM | POA: Diagnosis not present

## 2023-10-27 DIAGNOSIS — D539 Nutritional anemia, unspecified: Secondary | ICD-10-CM | POA: Diagnosis not present

## 2023-10-27 DIAGNOSIS — D696 Thrombocytopenia, unspecified: Secondary | ICD-10-CM | POA: Diagnosis not present

## 2023-10-27 DIAGNOSIS — D631 Anemia in chronic kidney disease: Secondary | ICD-10-CM | POA: Diagnosis not present

## 2023-10-27 DIAGNOSIS — Z87891 Personal history of nicotine dependence: Secondary | ICD-10-CM | POA: Diagnosis not present

## 2023-10-27 DIAGNOSIS — E538 Deficiency of other specified B group vitamins: Secondary | ICD-10-CM | POA: Diagnosis not present

## 2023-10-27 DIAGNOSIS — E1122 Type 2 diabetes mellitus with diabetic chronic kidney disease: Secondary | ICD-10-CM | POA: Diagnosis not present

## 2023-10-27 DIAGNOSIS — E785 Hyperlipidemia, unspecified: Secondary | ICD-10-CM | POA: Diagnosis not present

## 2023-10-27 DIAGNOSIS — D509 Iron deficiency anemia, unspecified: Secondary | ICD-10-CM | POA: Diagnosis not present

## 2023-10-27 DIAGNOSIS — I2510 Atherosclerotic heart disease of native coronary artery without angina pectoris: Secondary | ICD-10-CM | POA: Diagnosis not present

## 2023-10-27 DIAGNOSIS — I480 Paroxysmal atrial fibrillation: Secondary | ICD-10-CM | POA: Diagnosis not present

## 2023-10-27 DIAGNOSIS — I5032 Chronic diastolic (congestive) heart failure: Secondary | ICD-10-CM | POA: Diagnosis not present

## 2023-11-04 DIAGNOSIS — Z85828 Personal history of other malignant neoplasm of skin: Secondary | ICD-10-CM | POA: Diagnosis not present

## 2023-11-04 DIAGNOSIS — L89312 Pressure ulcer of right buttock, stage 2: Secondary | ICD-10-CM | POA: Diagnosis not present

## 2023-11-04 DIAGNOSIS — I5032 Chronic diastolic (congestive) heart failure: Secondary | ICD-10-CM | POA: Diagnosis not present

## 2023-11-04 DIAGNOSIS — Z79899 Other long term (current) drug therapy: Secondary | ICD-10-CM | POA: Diagnosis not present

## 2023-11-04 DIAGNOSIS — Z7982 Long term (current) use of aspirin: Secondary | ICD-10-CM | POA: Diagnosis not present

## 2023-11-04 DIAGNOSIS — Z9981 Dependence on supplemental oxygen: Secondary | ICD-10-CM | POA: Diagnosis not present

## 2023-11-04 DIAGNOSIS — E538 Deficiency of other specified B group vitamins: Secondary | ICD-10-CM | POA: Diagnosis not present

## 2023-11-04 DIAGNOSIS — I130 Hypertensive heart and chronic kidney disease with heart failure and stage 1 through stage 4 chronic kidney disease, or unspecified chronic kidney disease: Secondary | ICD-10-CM | POA: Diagnosis not present

## 2023-11-04 DIAGNOSIS — L89322 Pressure ulcer of left buttock, stage 2: Secondary | ICD-10-CM | POA: Diagnosis not present

## 2023-11-04 DIAGNOSIS — G4733 Obstructive sleep apnea (adult) (pediatric): Secondary | ICD-10-CM | POA: Diagnosis not present

## 2023-11-04 DIAGNOSIS — D631 Anemia in chronic kidney disease: Secondary | ICD-10-CM | POA: Diagnosis not present

## 2023-11-04 DIAGNOSIS — D509 Iron deficiency anemia, unspecified: Secondary | ICD-10-CM | POA: Diagnosis not present

## 2023-11-04 DIAGNOSIS — I2510 Atherosclerotic heart disease of native coronary artery without angina pectoris: Secondary | ICD-10-CM | POA: Diagnosis not present

## 2023-11-04 DIAGNOSIS — J449 Chronic obstructive pulmonary disease, unspecified: Secondary | ICD-10-CM | POA: Diagnosis not present

## 2023-11-04 DIAGNOSIS — Z87891 Personal history of nicotine dependence: Secondary | ICD-10-CM | POA: Diagnosis not present

## 2023-11-04 DIAGNOSIS — E1122 Type 2 diabetes mellitus with diabetic chronic kidney disease: Secondary | ICD-10-CM | POA: Diagnosis not present

## 2023-11-04 DIAGNOSIS — I480 Paroxysmal atrial fibrillation: Secondary | ICD-10-CM | POA: Diagnosis not present

## 2023-11-04 DIAGNOSIS — D539 Nutritional anemia, unspecified: Secondary | ICD-10-CM | POA: Diagnosis not present

## 2023-11-04 DIAGNOSIS — E785 Hyperlipidemia, unspecified: Secondary | ICD-10-CM | POA: Diagnosis not present

## 2023-11-04 DIAGNOSIS — N183 Chronic kidney disease, stage 3 unspecified: Secondary | ICD-10-CM | POA: Diagnosis not present

## 2023-11-04 DIAGNOSIS — D696 Thrombocytopenia, unspecified: Secondary | ICD-10-CM | POA: Diagnosis not present

## 2023-11-12 DIAGNOSIS — Z7982 Long term (current) use of aspirin: Secondary | ICD-10-CM | POA: Diagnosis not present

## 2023-11-12 DIAGNOSIS — I480 Paroxysmal atrial fibrillation: Secondary | ICD-10-CM | POA: Diagnosis not present

## 2023-11-12 DIAGNOSIS — I130 Hypertensive heart and chronic kidney disease with heart failure and stage 1 through stage 4 chronic kidney disease, or unspecified chronic kidney disease: Secondary | ICD-10-CM | POA: Diagnosis not present

## 2023-11-12 DIAGNOSIS — E785 Hyperlipidemia, unspecified: Secondary | ICD-10-CM | POA: Diagnosis not present

## 2023-11-12 DIAGNOSIS — D696 Thrombocytopenia, unspecified: Secondary | ICD-10-CM | POA: Diagnosis not present

## 2023-11-12 DIAGNOSIS — E1122 Type 2 diabetes mellitus with diabetic chronic kidney disease: Secondary | ICD-10-CM | POA: Diagnosis not present

## 2023-11-12 DIAGNOSIS — D539 Nutritional anemia, unspecified: Secondary | ICD-10-CM | POA: Diagnosis not present

## 2023-11-12 DIAGNOSIS — N183 Chronic kidney disease, stage 3 unspecified: Secondary | ICD-10-CM | POA: Diagnosis not present

## 2023-11-12 DIAGNOSIS — D509 Iron deficiency anemia, unspecified: Secondary | ICD-10-CM | POA: Diagnosis not present

## 2023-11-12 DIAGNOSIS — L89312 Pressure ulcer of right buttock, stage 2: Secondary | ICD-10-CM | POA: Diagnosis not present

## 2023-11-12 DIAGNOSIS — Z9981 Dependence on supplemental oxygen: Secondary | ICD-10-CM | POA: Diagnosis not present

## 2023-11-12 DIAGNOSIS — G4733 Obstructive sleep apnea (adult) (pediatric): Secondary | ICD-10-CM | POA: Diagnosis not present

## 2023-11-12 DIAGNOSIS — Z87891 Personal history of nicotine dependence: Secondary | ICD-10-CM | POA: Diagnosis not present

## 2023-11-12 DIAGNOSIS — E538 Deficiency of other specified B group vitamins: Secondary | ICD-10-CM | POA: Diagnosis not present

## 2023-11-12 DIAGNOSIS — I2510 Atherosclerotic heart disease of native coronary artery without angina pectoris: Secondary | ICD-10-CM | POA: Diagnosis not present

## 2023-11-12 DIAGNOSIS — I5032 Chronic diastolic (congestive) heart failure: Secondary | ICD-10-CM | POA: Diagnosis not present

## 2023-11-12 DIAGNOSIS — D631 Anemia in chronic kidney disease: Secondary | ICD-10-CM | POA: Diagnosis not present

## 2023-11-12 DIAGNOSIS — L89322 Pressure ulcer of left buttock, stage 2: Secondary | ICD-10-CM | POA: Diagnosis not present

## 2023-11-12 DIAGNOSIS — Z85828 Personal history of other malignant neoplasm of skin: Secondary | ICD-10-CM | POA: Diagnosis not present

## 2023-11-12 DIAGNOSIS — J449 Chronic obstructive pulmonary disease, unspecified: Secondary | ICD-10-CM | POA: Diagnosis not present

## 2023-11-12 DIAGNOSIS — Z79899 Other long term (current) drug therapy: Secondary | ICD-10-CM | POA: Diagnosis not present

## 2023-11-15 DIAGNOSIS — I480 Paroxysmal atrial fibrillation: Secondary | ICD-10-CM | POA: Diagnosis not present

## 2023-11-15 DIAGNOSIS — Z4509 Encounter for adjustment and management of other cardiac device: Secondary | ICD-10-CM | POA: Diagnosis not present

## 2023-11-30 ENCOUNTER — Ambulatory Visit: Payer: Self-pay

## 2023-11-30 NOTE — Telephone Encounter (Signed)
 Appointment has been made

## 2023-11-30 NOTE — Telephone Encounter (Signed)
 This RN made the third attempt to reach patient, busy signal unable to leave voicemail. Will route to office for follow up.

## 2023-11-30 NOTE — Telephone Encounter (Addendum)
 Second attempt; phone busy  First attempt made; phone busy    Copied from CRM [id number]. Topic: Clinical - Red Word Triage >> Nov 30, 2023  8:38 AM Merlynn LABOR wrote: Red Word that prompted transfer to Nurse Triage: Lower leg pain from being diabetic possibly.

## 2023-12-01 ENCOUNTER — Ambulatory Visit

## 2023-12-01 ENCOUNTER — Encounter: Payer: Self-pay | Admitting: Family

## 2023-12-01 VITALS — BP 115/55 | HR 68 | Temp 97.4°F | Ht 69.0 in

## 2023-12-01 DIAGNOSIS — R7303 Prediabetes: Secondary | ICD-10-CM

## 2023-12-01 DIAGNOSIS — L03115 Cellulitis of right lower limb: Secondary | ICD-10-CM | POA: Diagnosis not present

## 2023-12-01 DIAGNOSIS — L03116 Cellulitis of left lower limb: Secondary | ICD-10-CM | POA: Diagnosis not present

## 2023-12-01 DIAGNOSIS — Z23 Encounter for immunization: Secondary | ICD-10-CM | POA: Diagnosis not present

## 2023-12-01 DIAGNOSIS — I5032 Chronic diastolic (congestive) heart failure: Secondary | ICD-10-CM | POA: Diagnosis not present

## 2023-12-01 DIAGNOSIS — L03119 Cellulitis of unspecified part of limb: Secondary | ICD-10-CM

## 2023-12-01 MED ORDER — ACCU-CHEK SOFTCLIX LANCETS MISC: 9 refills | Status: AC

## 2023-12-01 MED ORDER — CEPHALEXIN 500 MG PO CAPS: 500.0000 mg | ORAL_CAPSULE | Freq: Three times a day (TID) | ORAL | 0 refills | Status: DC

## 2023-12-01 NOTE — Progress Notes (Signed)
 Subjective:    Patient ID: Marcus Carr, male    DOB: 1940-06-13, 83 y.o.   MRN: [id number]  Chief Complaint  Patient presents with   Rash    Rash   PT presents to the office today with bilateral erythemas of bilateral legs that he noticed 4 days ago. States it has gradually worsen. Reports aching pain of 5 out 10.   Has CHF and  taking lasix  40 mg daily.    Review of Systems  Skin:  Positive for rash.  All other systems reviewed and are negative.   Social History   Socioeconomic History   Marital status: Married    Spouse name: Rosa   Number of children: 2   Years of education: 8   Highest education level: 8th grade  Occupational History   Occupation: Retired  Tobacco Use   Smoking status: Former    Current packs/day: 0.00    Average packs/day: 1.5 packs/day for 50.0 years (75.0 ttl pk-yrs)    Types: Cigarettes    Start date: 10/23/1964    Quit date: 10/24/2014    Years since quitting: 9.1   Smokeless tobacco: Never  Vaping Use   Vaping status: Never Used  Substance and Sexual Activity   Alcohol use: No    Alcohol/week: 0.0 standard drinks of alcohol   Drug use: No   Sexual activity: Not Currently  Other Topics Concern   Not on file  Social History Narrative   Lives wife and grandson stays with them a lot.  Two daughters - both live within 10 miles   Social Drivers of Health   Financial Resource Strain: Patient Declined (04/27/2023)   Received from [facility]   Overall Financial Resource Strain (CARDIA)    Difficulty of Paying Living Expenses: Patient declined  Food Insecurity: No Food Insecurity (05/11/2023)   Received from [facility]   Hunger Vital Sign    Within the past 12 months, you worried that your food would run out before you got the money to buy more.: Never true    Within the past 12 months, the food you bought just didn't last and you didn't have money to get more.: Never true  Transportation Needs: No Transportation Needs  (05/14/2023)   Received from [facility] - Transportation    Lack of Transportation (Medical): No    Lack of Transportation (Non-Medical): No  Physical Activity: Inactive (01/21/2023)   Exercise Vital Sign    Days of Exercise per Week: 0 days    Minutes of Exercise per Session: 0 min  Stress: Stress Concern Present (05/11/2023)   Received from [facility] of Occupational Health - Occupational Stress Questionnaire    Feeling of Stress : To some extent  Social Connections: Moderately Isolated (01/21/2023)   Social Connection and Isolation Panel    Frequency of Communication with Friends and Family: More than three times a week    Frequency of Social Gatherings with Friends and Family: More than three times a week    Attends Religious Services: Never    Database administrator or Organizations: No    Attends Banker Meetings: Never    Marital Status: Married   Family History  Problem Relation Age of Onset   Cancer Mother        esophageal   Heart attack Father 98        Objective:   Physical Exam Vitals reviewed.  Constitutional:  General: He is not in acute distress.    Appearance: He is well-developed.  HENT:     Head: Normocephalic.  Eyes:     General:        Right eye: No discharge.        Left eye: No discharge.     Pupils: Pupils are equal, round, and reactive to light.  Neck:     Thyroid : No thyromegaly.  Cardiovascular:     Rate and Rhythm: Normal rate and regular rhythm.     Heart sounds: Normal heart sounds. No murmur heard. Pulmonary:     Effort: Pulmonary effort is normal. No respiratory distress.     Breath sounds: Normal breath sounds. No wheezing.  Abdominal:     General: Bowel sounds are normal. There is no distension.     Palpations: Abdomen is soft.     Tenderness: There is no abdominal tenderness.  Musculoskeletal:        General: No tenderness. Normal range of motion.     Cervical back: Normal  range of motion and neck supple.     Right lower leg: Edema (3+) present.     Left lower leg: Edema (3+) present.  Skin:    General: Skin is warm and dry.     Findings: No erythema or rash.  Neurological:     Mental Status: He is alert and oriented to person, place, and time.     Cranial Nerves: No cranial nerve deficit.     Deep Tendon Reflexes: Reflexes are normal and symmetric.  Psychiatric:        Behavior: Behavior normal.        Thought Content: Thought content normal.        Judgment: Judgment normal.      Bilateral unna boots applied   BP (!) 115/55   Pulse 68   Temp (!) 97.4 F (36.3 C) (Temporal)   Ht 5' 9 (1.753 m)   SpO2 91%   BMI 43.12 kg/m      Assessment & Plan:  Akin Yi comes in today with chief complaint of Rash   Diagnosis and orders addressed:  1. Prediabetes - Accu-Chek Softclix Lancets lancets; USE UP TO 4 TIMES DAILY AS DIRECTED (E10.9, E11.9)  Dispense: 100 each; Refill: 9  2. Encounter for immunization (Primary) - Flu vaccine HIGH DOSE PF(Fluzone Trivalent)  3. Chronic diastolic CHF (congestive heart failure) (HCC) - cephALEXin  (KEFLEX ) 500 MG capsule; Take 1 capsule (500 mg total) by mouth 3 (three) times daily.  Dispense: 21 capsule; Refill: 0  4. Cellulitis of lower extremity, unspecified laterality - cephALEXin  (KEFLEX ) 500 MG capsule; Take 1 capsule (500 mg total) by mouth 3 (three) times daily.  Dispense: 21 capsule; Refill: 0 - Ambulatory referral to Home Health   Start Keflex  TID  Referral to Home Health pending  Home Health ordered  Continue Lasix  40 mg daily Follow up in 1 week if redness is not improve     Bari Learn, FNP

## 2023-12-01 NOTE — Patient Instructions (Signed)

## 2023-12-11 ENCOUNTER — Telehealth: Payer: Self-pay | Admitting: Family Medicine

## 2023-12-11 NOTE — Telephone Encounter (Signed)
 Verbal given

## 2023-12-11 NOTE — Telephone Encounter (Signed)
 Copied from CRM [id number]. Topic: Clinical - Home Health Verbal Orders >> Dec 11, 2023 12:30 PM Delon DASEN wrote: Caller/Agency: Bernarda with [facility] Callback Number: [phone] Service Requested: Skilled Nursing and physical therapy Frequency: n/a Any new concerns about the patient? No- need an order to delay care until Monday

## 2023-12-12 DIAGNOSIS — Z9181 History of falling: Secondary | ICD-10-CM | POA: Diagnosis not present

## 2023-12-12 DIAGNOSIS — Z7984 Long term (current) use of oral hypoglycemic drugs: Secondary | ICD-10-CM | POA: Diagnosis not present

## 2023-12-12 DIAGNOSIS — Z87891 Personal history of nicotine dependence: Secondary | ICD-10-CM | POA: Diagnosis not present

## 2023-12-12 DIAGNOSIS — J449 Chronic obstructive pulmonary disease, unspecified: Secondary | ICD-10-CM | POA: Diagnosis not present

## 2023-12-12 DIAGNOSIS — N183 Chronic kidney disease, stage 3 unspecified: Secondary | ICD-10-CM | POA: Diagnosis not present

## 2023-12-12 DIAGNOSIS — Z556 Problems related to health literacy: Secondary | ICD-10-CM | POA: Diagnosis not present

## 2023-12-12 DIAGNOSIS — D509 Iron deficiency anemia, unspecified: Secondary | ICD-10-CM | POA: Diagnosis not present

## 2023-12-12 DIAGNOSIS — M51369 Other intervertebral disc degeneration, lumbar region without mention of lumbar back pain or lower extremity pain: Secondary | ICD-10-CM | POA: Diagnosis not present

## 2023-12-12 DIAGNOSIS — E873 Alkalosis: Secondary | ICD-10-CM | POA: Diagnosis not present

## 2023-12-12 DIAGNOSIS — G253 Myoclonus: Secondary | ICD-10-CM | POA: Diagnosis not present

## 2023-12-12 DIAGNOSIS — E538 Deficiency of other specified B group vitamins: Secondary | ICD-10-CM | POA: Diagnosis not present

## 2023-12-12 DIAGNOSIS — L03115 Cellulitis of right lower limb: Secondary | ICD-10-CM | POA: Diagnosis not present

## 2023-12-12 DIAGNOSIS — D696 Thrombocytopenia, unspecified: Secondary | ICD-10-CM | POA: Diagnosis not present

## 2023-12-12 DIAGNOSIS — I130 Hypertensive heart and chronic kidney disease with heart failure and stage 1 through stage 4 chronic kidney disease, or unspecified chronic kidney disease: Secondary | ICD-10-CM | POA: Diagnosis not present

## 2023-12-12 DIAGNOSIS — I5032 Chronic diastolic (congestive) heart failure: Secondary | ICD-10-CM | POA: Diagnosis not present

## 2023-12-12 DIAGNOSIS — J9611 Chronic respiratory failure with hypoxia: Secondary | ICD-10-CM | POA: Diagnosis not present

## 2023-12-12 DIAGNOSIS — I480 Paroxysmal atrial fibrillation: Secondary | ICD-10-CM | POA: Diagnosis not present

## 2023-12-12 DIAGNOSIS — Z9981 Dependence on supplemental oxygen: Secondary | ICD-10-CM | POA: Diagnosis not present

## 2023-12-12 DIAGNOSIS — Z7982 Long term (current) use of aspirin: Secondary | ICD-10-CM | POA: Diagnosis not present

## 2023-12-12 DIAGNOSIS — L03116 Cellulitis of left lower limb: Secondary | ICD-10-CM | POA: Diagnosis not present

## 2023-12-12 DIAGNOSIS — Z85828 Personal history of other malignant neoplasm of skin: Secondary | ICD-10-CM | POA: Diagnosis not present

## 2023-12-12 DIAGNOSIS — E1122 Type 2 diabetes mellitus with diabetic chronic kidney disease: Secondary | ICD-10-CM | POA: Diagnosis not present

## 2023-12-12 DIAGNOSIS — I2510 Atherosclerotic heart disease of native coronary artery without angina pectoris: Secondary | ICD-10-CM | POA: Diagnosis not present

## 2023-12-14 ENCOUNTER — Encounter: Payer: Self-pay | Admitting: Family

## 2023-12-14 ENCOUNTER — Ambulatory Visit: Admitting: Family

## 2023-12-14 ENCOUNTER — Other Ambulatory Visit: Payer: Self-pay | Admitting: Family

## 2023-12-14 VITALS — BP 131/69 | HR 53 | Temp 97.9°F | Ht 69.0 in | Wt 292.0 lb

## 2023-12-14 DIAGNOSIS — N1832 Chronic kidney disease, stage 3b: Secondary | ICD-10-CM | POA: Diagnosis not present

## 2023-12-14 DIAGNOSIS — E785 Hyperlipidemia, unspecified: Secondary | ICD-10-CM

## 2023-12-14 DIAGNOSIS — Z0001 Encounter for general adult medical examination with abnormal findings: Secondary | ICD-10-CM | POA: Diagnosis not present

## 2023-12-14 DIAGNOSIS — F419 Anxiety disorder, unspecified: Secondary | ICD-10-CM

## 2023-12-14 DIAGNOSIS — M150 Primary generalized (osteo)arthritis: Secondary | ICD-10-CM

## 2023-12-14 DIAGNOSIS — I5032 Chronic diastolic (congestive) heart failure: Secondary | ICD-10-CM | POA: Diagnosis not present

## 2023-12-14 DIAGNOSIS — L03116 Cellulitis of left lower limb: Secondary | ICD-10-CM

## 2023-12-14 DIAGNOSIS — E8881 Metabolic syndrome: Secondary | ICD-10-CM

## 2023-12-14 DIAGNOSIS — M79671 Pain in right foot: Secondary | ICD-10-CM | POA: Diagnosis not present

## 2023-12-14 DIAGNOSIS — I10 Essential (primary) hypertension: Secondary | ICD-10-CM

## 2023-12-14 DIAGNOSIS — R531 Weakness: Secondary | ICD-10-CM

## 2023-12-14 DIAGNOSIS — Z0000 Encounter for general adult medical examination without abnormal findings: Secondary | ICD-10-CM

## 2023-12-14 DIAGNOSIS — R7303 Prediabetes: Secondary | ICD-10-CM

## 2023-12-14 DIAGNOSIS — M51361 Other intervertebral disc degeneration, lumbar region with lower extremity pain only: Secondary | ICD-10-CM

## 2023-12-14 LAB — LIPID PANEL

## 2023-12-14 MED ORDER — ATORVASTATIN CALCIUM 20 MG PO TABS: 20.0000 mg | ORAL_TABLET | Freq: Every day | ORAL | 2 refills | Status: AC

## 2023-12-14 MED ORDER — GABAPENTIN 100 MG PO CAPS: 100.0000 mg | ORAL_CAPSULE | Freq: Three times a day (TID) | ORAL | 1 refills | Status: AC

## 2023-12-14 MED ORDER — CITALOPRAM HYDROBROMIDE 20 MG PO TABS: 20.0000 mg | ORAL_TABLET | Freq: Every day | ORAL | 2 refills | Status: AC

## 2023-12-14 MED ORDER — FUROSEMIDE 20 MG PO TABS: 40.0000 mg | ORAL_TABLET | Freq: Two times a day (BID) | ORAL | 0 refills | Status: AC

## 2023-12-14 MED ORDER — PROAIR RESPICLICK 108 (90 BASE) MCG/ACT IN AEPB: 1.0000 | INHALATION_SPRAY | RESPIRATORY_TRACT | 1 refills | Status: DC

## 2023-12-14 MED ORDER — VERAPAMIL HCL ER 120 MG PO TBCR: 120.0000 mg | EXTENDED_RELEASE_TABLET | Freq: Every day | ORAL | 2 refills | Status: DC

## 2023-12-14 NOTE — Patient Instructions (Signed)

## 2023-12-14 NOTE — Progress Notes (Signed)
 Subjective:    Patient ID: Marcus Carr, male    DOB: 1941-02-26, 83 y.o.   MRN: [id number]  Chief Complaint  Patient presents with   Annual Exam   Pt presents to the office today for  CPE and chronic follow up.   PT has history of osteomyelitis of mandible, doing stable at this time. Pt is followed by Pain Management every 4 months for chronic back pain.    Pt has had elevated glucose and diagnosed as prediabetes.    He has CKD and tries to limit NSAID's. He is morbid obese with a BMI of 43.   He is followed by Cardiologists for syncope and CHF. Has not seen them recently.   He has COPD and chronic respiratory failure and uses 2 L of O2. He quit smoking 2016.  He has home health coming out twice a week for unna boots for cellulitis of LLE.  Hypertension This is a chronic problem. The current episode started more than 1 year ago. The problem has been resolved since onset. The problem is controlled. Associated symptoms include anxiety, malaise/fatigue, peripheral edema and shortness of breath (when walking). Pertinent negatives include no blurred vision. Risk factors for coronary artery disease include dyslipidemia, obesity, male gender and sedentary lifestyle. The current treatment provides moderate improvement. Hypertensive end-organ damage includes heart failure.  Congestive Heart Failure Presents for follow-up visit. Associated symptoms include edema, fatigue, nocturia and shortness of breath (when walking). The symptoms have been stable.  Arthritis Presents for follow-up visit. He complains of pain and stiffness. Affected locations include the left knee, right knee, left MCP, right MCP, left hip and right hip. His pain is at a severity of 4/10. Associated symptoms include fatigue.  Diabetes He presents for his follow-up diabetic visit. Diabetes type: prediabetic. Hypoglycemia symptoms include nervousness/anxiousness. Associated symptoms include fatigue. Pertinent  negatives for diabetes include no blurred vision and no foot paresthesias. Diabetic complications include peripheral neuropathy. Risk factors for coronary artery disease include dyslipidemia, diabetes mellitus, hypertension, sedentary lifestyle and post-menopausal. He is following a generally unhealthy diet. His overall blood glucose range is 110-130 mg/dl.  Hyperlipidemia This is a chronic problem. The current episode started more than 1 year ago. The problem is controlled. Recent lipid tests were reviewed and are normal. Exacerbating diseases include obesity. Associated symptoms include shortness of breath (when walking). Current antihyperlipidemic treatment includes statins. The current treatment provides moderate improvement of lipids. Risk factors for coronary artery disease include dyslipidemia, diabetes mellitus, hypertension, male sex, obesity and a sedentary lifestyle.  Anxiety Presents for follow-up visit. Symptoms include excessive worry, nervous/anxious behavior and shortness of breath (when walking). Symptoms occur occasionally. The severity of symptoms is moderate.    Back Pain This is a chronic problem. The current episode started more than 1 year ago. The problem occurs intermittently. The problem has been waxing and waning since onset. The pain is present in the lumbar spine. The quality of the pain is described as aching. The pain is at a severity of 7/10. The pain is moderate.      Review of Systems  Constitutional:  Positive for fatigue and malaise/fatigue.  Eyes:  Negative for blurred vision.  Respiratory:  Positive for shortness of breath (when walking).   Genitourinary:  Positive for nocturia.  Musculoskeletal:  Positive for back pain and stiffness.  Psychiatric/Behavioral:  The patient is nervous/anxious.   All other systems reviewed and are negative.  Family History  Problem Relation Age of Onset  Cancer Mother        esophageal   Heart attack Father 50    Social History   Socioeconomic History   Marital status: Married    Spouse name: Rosa   Number of children: 2   Years of education: 8   Highest education level: 8th grade  Occupational History   Occupation: Retired  Tobacco Use   Smoking status: Former    Current packs/day: 0.00    Average packs/day: 1.5 packs/day for 50.0 years (75.0 ttl pk-yrs)    Types: Cigarettes    Start date: 10/23/1964    Quit date: 10/24/2014    Years since quitting: 9.1   Smokeless tobacco: Never  Vaping Use   Vaping status: Never Used  Substance and Sexual Activity   Alcohol use: No    Alcohol/week: 0.0 standard drinks of alcohol   Drug use: No   Sexual activity: Not Currently  Other Topics Concern   Not on file  Social History Narrative   Lives wife and grandson stays with them a lot.  Two daughters - both live within 10 miles   Social Drivers of Health   Financial Resource Strain: Patient Declined (04/27/2023)   Received from [facility]   Overall Financial Resource Strain (CARDIA)    Difficulty of Paying Living Expenses: Patient declined  Food Insecurity: No Food Insecurity (05/11/2023)   Received from [facility]   Hunger Vital Sign    Within the past 12 months, you worried that your food would run out before you got the money to buy more.: Never true    Within the past 12 months, the food you bought just didn't last and you didn't have money to get more.: Never true  Transportation Needs: No Transportation Needs (05/14/2023)   Received from [facility] - Transportation    Lack of Transportation (Medical): No    Lack of Transportation (Non-Medical): No  Physical Activity: Inactive (01/21/2023)   Exercise Vital Sign    Days of Exercise per Week: 0 days    Minutes of Exercise per Session: 0 min  Stress: Stress Concern Present (05/11/2023)   Received from [facility] - Occupational Stress Questionnaire    Feeling of Stress : To  some extent  Social Connections: Moderately Isolated (01/21/2023)   Social Connection and Isolation Panel    Frequency of Communication with Friends and Family: More than three times a week    Frequency of Social Gatherings with Friends and Family: More than three times a week    Attends Religious Services: Never    Database administrator or Organizations: No    Attends Banker Meetings: Never    Marital Status: Married       Objective:   Physical Exam Vitals reviewed.  Constitutional:      General: He is not in acute distress.    Appearance: He is well-developed. He is obese.  HENT:     Head: Normocephalic.     Right Ear: Tympanic membrane normal.     Left Ear: Tympanic membrane normal.  Eyes:     General:        Right eye: No discharge.        Left eye: No discharge.     Pupils: Pupils are equal, round, and reactive to light.  Neck:     Thyroid : No thyromegaly.  Cardiovascular:     Rate and Rhythm: Normal rate and regular rhythm.  Heart sounds: Normal heart sounds. No murmur heard. Pulmonary:     Effort: Pulmonary effort is normal. No respiratory distress.     Breath sounds: Wheezing and rhonchi present.  Abdominal:     General: Bowel sounds are normal. There is no distension.     Palpations: Abdomen is soft.     Tenderness: There is no abdominal tenderness.  Musculoskeletal:        General: No tenderness. Normal range of motion.     Cervical back: Normal range of motion and neck supple.     Right lower leg: Edema (2+) present.     Left lower leg: Edema (2+) present.  Skin:    General: Skin is warm and dry.     Findings: Bruising present. No erythema or rash.  Neurological:     Mental Status: He is alert and oriented to person, place, and time.     Cranial Nerves: No cranial nerve deficit.     Motor: Weakness present.     Gait: Gait abnormal (in wheelchair).     Deep Tendon Reflexes: Reflexes are normal and symmetric.  Psychiatric:         Behavior: Behavior normal.        Thought Content: Thought content normal.        Judgment: Judgment normal.       BP 131/69   Pulse (!) 53   Temp 97.9 F (36.6 C)   Ht 5' 9 (1.753 m)   Wt 292 lb (132.5 kg)   BMI 43.12 kg/m      Assessment & Plan:  Less Woolsey comes in today with chief complaint of Annual Exam   Diagnosis and orders addressed:  1. Hyperlipidemia, unspecified hyperlipidemia type - atorvastatin  (LIPITOR) 20 MG tablet; Take 1 tablet (20 mg total) by mouth daily.  Dispense: 90 tablet; Refill: 2 - CMP14+EGFR - CBC with Differential/Platelet - Lipid panel  2. Metabolic syndrome - atorvastatin  (LIPITOR) 20 MG tablet; Take 1 tablet (20 mg total) by mouth daily.  Dispense: 90 tablet; Refill: 2 - CMP14+EGFR - CBC with Differential/Platelet  3. Anxiety - citalopram  (CELEXA ) 20 MG tablet; Take 1 tablet (20 mg total) by mouth daily.  Dispense: 90 tablet; Refill: 2 - CMP14+EGFR - CBC with Differential/Platelet  4. Pain in both feet - gabapentin  (NEURONTIN ) 100 MG capsule; Take 1 capsule (100 mg total) by mouth 3 (three) times daily.  Dispense: 270 capsule; Refill: 1 - CMP14+EGFR - CBC with Differential/Platelet  5. Primary hypertension - verapamil  (CALAN -SR) 120 MG CR tablet; Take 1 tablet (120 mg total) by mouth at bedtime.  Dispense: 100 tablet; Refill: 2 - CMP14+EGFR - CBC with Differential/Platelet  6. Annual physical exam (Primary) - CMP14+EGFR - CBC with Differential/Platelet - Lipid panel - TSH - Vitamin B12  7. Chronic diastolic CHF (congestive heart failure) (HCC)  - CMP14+EGFR - CBC with Differential/Platelet  8. Stage 3b chronic kidney disease (HCC) - CMP14+EGFR - CBC with Differential/Platelet  9. Morbid obesity (HCC) - CMP14+EGFR - CBC with Differential/Platelet  10. Prediabetes  - CMP14+EGFR - CBC with Differential/Platelet - Vitamin B12  11. Primary osteoarthritis involving multiple joints - CMP14+EGFR - CBC with  Differential/Platelet  12. Generalized weakness - CMP14+EGFR - CBC with Differential/Platelet  13. Degeneration of intervertebral disc of lumbar region with lower extremity pain  - CMP14+EGFR - CBC with Differential/Platelet  14. Cellulitis of left lower extremity - CMP14+EGFR - CBC with Differential/Platelet    Labs pending Continue current medications  Keep follow  up with specialists  Health Maintenance reviewed Diet and exercise encouraged  Follow up plan: 3 months    Bari Learn, FNP

## 2023-12-14 NOTE — Telephone Encounter (Signed)
 albuterol  (VENTOLIN  HFA) 108 (90 Base) MCG/ACT inhaler        Changed from: Albuterol  Sulfate (PROAIR  RESPICLICK) 108 (90 Base) MCG/ACT AEPB   Pharmacy comment: Alternative Requested:THE PRESCRIBED MEDICATION IS NOT COVERED BY INSURANCE. PLEASE CONSIDER CHANGING TO ONE OF THE SUGGESTED COVERED ALTERNATIVES.   All Pharmacy Suggested Alternatives:  albuterol  (VENTOLIN  HFA) 108 (90 Base) MCG/ACT inhaler albuterol  (VENTOLIN  HFA) 108 (90 Base) MCG/ACT inhaler levalbuterol (XOPENEX HFA) 45 MCG/ACT inhaler

## 2023-12-15 ENCOUNTER — Ambulatory Visit: Payer: Self-pay | Admitting: Family

## 2023-12-15 DIAGNOSIS — G253 Myoclonus: Secondary | ICD-10-CM | POA: Diagnosis not present

## 2023-12-15 DIAGNOSIS — N183 Chronic kidney disease, stage 3 unspecified: Secondary | ICD-10-CM | POA: Diagnosis not present

## 2023-12-15 DIAGNOSIS — Z9181 History of falling: Secondary | ICD-10-CM | POA: Diagnosis not present

## 2023-12-15 DIAGNOSIS — E1122 Type 2 diabetes mellitus with diabetic chronic kidney disease: Secondary | ICD-10-CM | POA: Diagnosis not present

## 2023-12-15 DIAGNOSIS — Z85828 Personal history of other malignant neoplasm of skin: Secondary | ICD-10-CM | POA: Diagnosis not present

## 2023-12-15 DIAGNOSIS — L03116 Cellulitis of left lower limb: Secondary | ICD-10-CM | POA: Diagnosis not present

## 2023-12-15 DIAGNOSIS — J449 Chronic obstructive pulmonary disease, unspecified: Secondary | ICD-10-CM | POA: Diagnosis not present

## 2023-12-15 DIAGNOSIS — D509 Iron deficiency anemia, unspecified: Secondary | ICD-10-CM | POA: Diagnosis not present

## 2023-12-15 DIAGNOSIS — E873 Alkalosis: Secondary | ICD-10-CM | POA: Diagnosis not present

## 2023-12-15 DIAGNOSIS — I5032 Chronic diastolic (congestive) heart failure: Secondary | ICD-10-CM | POA: Diagnosis not present

## 2023-12-15 DIAGNOSIS — M51369 Other intervertebral disc degeneration, lumbar region without mention of lumbar back pain or lower extremity pain: Secondary | ICD-10-CM | POA: Diagnosis not present

## 2023-12-15 DIAGNOSIS — Z87891 Personal history of nicotine dependence: Secondary | ICD-10-CM | POA: Diagnosis not present

## 2023-12-15 DIAGNOSIS — E538 Deficiency of other specified B group vitamins: Secondary | ICD-10-CM | POA: Diagnosis not present

## 2023-12-15 DIAGNOSIS — L03115 Cellulitis of right lower limb: Secondary | ICD-10-CM | POA: Diagnosis not present

## 2023-12-15 DIAGNOSIS — J9611 Chronic respiratory failure with hypoxia: Secondary | ICD-10-CM | POA: Diagnosis not present

## 2023-12-15 DIAGNOSIS — I130 Hypertensive heart and chronic kidney disease with heart failure and stage 1 through stage 4 chronic kidney disease, or unspecified chronic kidney disease: Secondary | ICD-10-CM | POA: Diagnosis not present

## 2023-12-15 DIAGNOSIS — D696 Thrombocytopenia, unspecified: Secondary | ICD-10-CM | POA: Diagnosis not present

## 2023-12-15 DIAGNOSIS — Z7984 Long term (current) use of oral hypoglycemic drugs: Secondary | ICD-10-CM | POA: Diagnosis not present

## 2023-12-15 DIAGNOSIS — Z7982 Long term (current) use of aspirin: Secondary | ICD-10-CM | POA: Diagnosis not present

## 2023-12-15 DIAGNOSIS — I480 Paroxysmal atrial fibrillation: Secondary | ICD-10-CM | POA: Diagnosis not present

## 2023-12-15 DIAGNOSIS — Z9981 Dependence on supplemental oxygen: Secondary | ICD-10-CM | POA: Diagnosis not present

## 2023-12-15 DIAGNOSIS — I2510 Atherosclerotic heart disease of native coronary artery without angina pectoris: Secondary | ICD-10-CM | POA: Diagnosis not present

## 2023-12-15 DIAGNOSIS — Z556 Problems related to health literacy: Secondary | ICD-10-CM | POA: Diagnosis not present

## 2023-12-15 LAB — TSH: TSH: 1.51 u[IU]/mL (ref 0.450–4.500)

## 2023-12-15 LAB — CBC WITH DIFFERENTIAL/PLATELET
Basophils Absolute: 0 x10E3/uL (ref 0.0–0.2)
Basos: 1 %
EOS (ABSOLUTE): 0 x10E3/uL (ref 0.0–0.4)
Eos: 1 %
Hematocrit: 41.6 % (ref 37.5–51.0)
Hemoglobin: 13.2 g/dL (ref 13.0–17.7)
Immature Grans (Abs): 0 x10E3/uL (ref 0.0–0.1)
Immature Granulocytes: 0 %
Lymphocytes Absolute: 1 x10E3/uL (ref 0.7–3.1)
Lymphs: 19 %
MCH: 28 pg (ref 26.6–33.0)
MCHC: 31.7 g/dL (ref 31.5–35.7)
MCV: 88 fL (ref 79–97)
Monocytes Absolute: 0.6 x10E3/uL (ref 0.1–0.9)
Monocytes: 11 %
Neutrophils Absolute: 3.6 x10E3/uL (ref 1.4–7.0)
Neutrophils: 68 %
Platelets: 156 x10E3/uL (ref 150–450)
RBC: 4.71 x10E6/uL (ref 4.14–5.80)
RDW: 14.3 % (ref 11.6–15.4)
WBC: 5.2 x10E3/uL (ref 3.4–10.8)

## 2023-12-15 LAB — CMP14+EGFR
ALT: 13 IU/L (ref 0–44)
AST: 16 IU/L (ref 0–40)
Albumin: 4 g/dL (ref 3.7–4.7)
Alkaline Phosphatase: 119 IU/L (ref 48–129)
BUN/Creatinine Ratio: 17 (ref 10–24)
BUN: 31 mg/dL — AB (ref 8–27)
Bilirubin Total: 0.4 mg/dL (ref 0.0–1.2)
CO2: 25 mmol/L (ref 20–29)
Calcium: 9.3 mg/dL (ref 8.6–10.2)
Chloride: 98 mmol/L (ref 96–106)
Creatinine, Ser: 1.82 mg/dL — AB (ref 0.76–1.27)
Globulin, Total: 2.7 g/dL (ref 1.5–4.5)
Glucose: 96 mg/dL (ref 70–99)
Potassium: 4.7 mmol/L (ref 3.5–5.2)
Sodium: 140 mmol/L (ref 134–144)
Total Protein: 6.7 g/dL (ref 6.0–8.5)
eGFR: 37 mL/min/1.73 — AB (ref >59)

## 2023-12-15 LAB — LIPID PANEL
Cholesterol, Total: 155 mg/dL (ref 100–199)
HDL: 32 mg/dL — AB (ref >39)
LDL CALC COMMENT:: 4.8 ratio (ref 0.0–5.0)
LDL Chol Calc (NIH): 92 mg/dL (ref 0–99)
Triglycerides: 179 mg/dL — AB (ref 0–149)
VLDL Cholesterol Cal: 31 mg/dL (ref 5–40)

## 2023-12-15 LAB — VITAMIN B12: Vitamin B-12: 315 pg/mL (ref 232–1245)

## 2023-12-16 DIAGNOSIS — L03115 Cellulitis of right lower limb: Secondary | ICD-10-CM | POA: Diagnosis not present

## 2023-12-16 DIAGNOSIS — I130 Hypertensive heart and chronic kidney disease with heart failure and stage 1 through stage 4 chronic kidney disease, or unspecified chronic kidney disease: Secondary | ICD-10-CM | POA: Diagnosis not present

## 2023-12-16 DIAGNOSIS — I5032 Chronic diastolic (congestive) heart failure: Secondary | ICD-10-CM | POA: Diagnosis not present

## 2023-12-16 DIAGNOSIS — I480 Paroxysmal atrial fibrillation: Secondary | ICD-10-CM | POA: Diagnosis not present

## 2023-12-16 DIAGNOSIS — J449 Chronic obstructive pulmonary disease, unspecified: Secondary | ICD-10-CM | POA: Diagnosis not present

## 2023-12-16 DIAGNOSIS — D509 Iron deficiency anemia, unspecified: Secondary | ICD-10-CM | POA: Diagnosis not present

## 2023-12-16 DIAGNOSIS — N183 Chronic kidney disease, stage 3 unspecified: Secondary | ICD-10-CM | POA: Diagnosis not present

## 2023-12-16 DIAGNOSIS — Z556 Problems related to health literacy: Secondary | ICD-10-CM | POA: Diagnosis not present

## 2023-12-16 DIAGNOSIS — I2510 Atherosclerotic heart disease of native coronary artery without angina pectoris: Secondary | ICD-10-CM | POA: Diagnosis not present

## 2023-12-16 DIAGNOSIS — J9611 Chronic respiratory failure with hypoxia: Secondary | ICD-10-CM | POA: Diagnosis not present

## 2023-12-16 DIAGNOSIS — L03116 Cellulitis of left lower limb: Secondary | ICD-10-CM | POA: Diagnosis not present

## 2023-12-16 DIAGNOSIS — Z7982 Long term (current) use of aspirin: Secondary | ICD-10-CM | POA: Diagnosis not present

## 2023-12-16 DIAGNOSIS — M51369 Other intervertebral disc degeneration, lumbar region without mention of lumbar back pain or lower extremity pain: Secondary | ICD-10-CM | POA: Diagnosis not present

## 2023-12-16 DIAGNOSIS — Z85828 Personal history of other malignant neoplasm of skin: Secondary | ICD-10-CM | POA: Diagnosis not present

## 2023-12-16 DIAGNOSIS — D696 Thrombocytopenia, unspecified: Secondary | ICD-10-CM | POA: Diagnosis not present

## 2023-12-16 DIAGNOSIS — Z87891 Personal history of nicotine dependence: Secondary | ICD-10-CM | POA: Diagnosis not present

## 2023-12-16 DIAGNOSIS — E873 Alkalosis: Secondary | ICD-10-CM | POA: Diagnosis not present

## 2023-12-16 DIAGNOSIS — G253 Myoclonus: Secondary | ICD-10-CM | POA: Diagnosis not present

## 2023-12-16 DIAGNOSIS — Z9981 Dependence on supplemental oxygen: Secondary | ICD-10-CM | POA: Diagnosis not present

## 2023-12-16 DIAGNOSIS — Z7984 Long term (current) use of oral hypoglycemic drugs: Secondary | ICD-10-CM | POA: Diagnosis not present

## 2023-12-16 DIAGNOSIS — E1122 Type 2 diabetes mellitus with diabetic chronic kidney disease: Secondary | ICD-10-CM | POA: Diagnosis not present

## 2023-12-16 DIAGNOSIS — Z9181 History of falling: Secondary | ICD-10-CM | POA: Diagnosis not present

## 2023-12-16 DIAGNOSIS — E538 Deficiency of other specified B group vitamins: Secondary | ICD-10-CM | POA: Diagnosis not present

## 2023-12-18 DIAGNOSIS — D696 Thrombocytopenia, unspecified: Secondary | ICD-10-CM | POA: Diagnosis not present

## 2023-12-18 DIAGNOSIS — E1122 Type 2 diabetes mellitus with diabetic chronic kidney disease: Secondary | ICD-10-CM | POA: Diagnosis not present

## 2023-12-18 DIAGNOSIS — Z556 Problems related to health literacy: Secondary | ICD-10-CM | POA: Diagnosis not present

## 2023-12-18 DIAGNOSIS — I5032 Chronic diastolic (congestive) heart failure: Secondary | ICD-10-CM | POA: Diagnosis not present

## 2023-12-18 DIAGNOSIS — Z9981 Dependence on supplemental oxygen: Secondary | ICD-10-CM | POA: Diagnosis not present

## 2023-12-18 DIAGNOSIS — N183 Chronic kidney disease, stage 3 unspecified: Secondary | ICD-10-CM | POA: Diagnosis not present

## 2023-12-18 DIAGNOSIS — L03116 Cellulitis of left lower limb: Secondary | ICD-10-CM | POA: Diagnosis not present

## 2023-12-18 DIAGNOSIS — L03115 Cellulitis of right lower limb: Secondary | ICD-10-CM | POA: Diagnosis not present

## 2023-12-18 DIAGNOSIS — Z87891 Personal history of nicotine dependence: Secondary | ICD-10-CM | POA: Diagnosis not present

## 2023-12-18 DIAGNOSIS — J9611 Chronic respiratory failure with hypoxia: Secondary | ICD-10-CM | POA: Diagnosis not present

## 2023-12-18 DIAGNOSIS — Z7982 Long term (current) use of aspirin: Secondary | ICD-10-CM | POA: Diagnosis not present

## 2023-12-18 DIAGNOSIS — I2510 Atherosclerotic heart disease of native coronary artery without angina pectoris: Secondary | ICD-10-CM | POA: Diagnosis not present

## 2023-12-18 DIAGNOSIS — Z9181 History of falling: Secondary | ICD-10-CM | POA: Diagnosis not present

## 2023-12-18 DIAGNOSIS — E873 Alkalosis: Secondary | ICD-10-CM | POA: Diagnosis not present

## 2023-12-18 DIAGNOSIS — E538 Deficiency of other specified B group vitamins: Secondary | ICD-10-CM | POA: Diagnosis not present

## 2023-12-18 DIAGNOSIS — Z85828 Personal history of other malignant neoplasm of skin: Secondary | ICD-10-CM | POA: Diagnosis not present

## 2023-12-18 DIAGNOSIS — I130 Hypertensive heart and chronic kidney disease with heart failure and stage 1 through stage 4 chronic kidney disease, or unspecified chronic kidney disease: Secondary | ICD-10-CM | POA: Diagnosis not present

## 2023-12-18 DIAGNOSIS — Z7984 Long term (current) use of oral hypoglycemic drugs: Secondary | ICD-10-CM | POA: Diagnosis not present

## 2023-12-18 DIAGNOSIS — G253 Myoclonus: Secondary | ICD-10-CM | POA: Diagnosis not present

## 2023-12-18 DIAGNOSIS — M51369 Other intervertebral disc degeneration, lumbar region without mention of lumbar back pain or lower extremity pain: Secondary | ICD-10-CM | POA: Diagnosis not present

## 2023-12-18 DIAGNOSIS — D509 Iron deficiency anemia, unspecified: Secondary | ICD-10-CM | POA: Diagnosis not present

## 2023-12-18 DIAGNOSIS — I480 Paroxysmal atrial fibrillation: Secondary | ICD-10-CM | POA: Diagnosis not present

## 2023-12-18 DIAGNOSIS — J449 Chronic obstructive pulmonary disease, unspecified: Secondary | ICD-10-CM | POA: Diagnosis not present

## 2023-12-20 DIAGNOSIS — I480 Paroxysmal atrial fibrillation: Secondary | ICD-10-CM | POA: Diagnosis not present

## 2023-12-20 DIAGNOSIS — Z95818 Presence of other cardiac implants and grafts: Secondary | ICD-10-CM | POA: Diagnosis not present

## 2023-12-22 DIAGNOSIS — G253 Myoclonus: Secondary | ICD-10-CM | POA: Diagnosis not present

## 2023-12-22 DIAGNOSIS — I480 Paroxysmal atrial fibrillation: Secondary | ICD-10-CM | POA: Diagnosis not present

## 2023-12-22 DIAGNOSIS — E873 Alkalosis: Secondary | ICD-10-CM | POA: Diagnosis not present

## 2023-12-22 DIAGNOSIS — L03115 Cellulitis of right lower limb: Secondary | ICD-10-CM | POA: Diagnosis not present

## 2023-12-22 DIAGNOSIS — M51369 Other intervertebral disc degeneration, lumbar region without mention of lumbar back pain or lower extremity pain: Secondary | ICD-10-CM | POA: Diagnosis not present

## 2023-12-22 DIAGNOSIS — Z87891 Personal history of nicotine dependence: Secondary | ICD-10-CM | POA: Diagnosis not present

## 2023-12-22 DIAGNOSIS — E1122 Type 2 diabetes mellitus with diabetic chronic kidney disease: Secondary | ICD-10-CM | POA: Diagnosis not present

## 2023-12-22 DIAGNOSIS — D696 Thrombocytopenia, unspecified: Secondary | ICD-10-CM | POA: Diagnosis not present

## 2023-12-22 DIAGNOSIS — Z9181 History of falling: Secondary | ICD-10-CM | POA: Diagnosis not present

## 2023-12-22 DIAGNOSIS — N183 Chronic kidney disease, stage 3 unspecified: Secondary | ICD-10-CM | POA: Diagnosis not present

## 2023-12-22 DIAGNOSIS — D509 Iron deficiency anemia, unspecified: Secondary | ICD-10-CM | POA: Diagnosis not present

## 2023-12-22 DIAGNOSIS — J9611 Chronic respiratory failure with hypoxia: Secondary | ICD-10-CM | POA: Diagnosis not present

## 2023-12-22 DIAGNOSIS — L03116 Cellulitis of left lower limb: Secondary | ICD-10-CM | POA: Diagnosis not present

## 2023-12-22 DIAGNOSIS — J449 Chronic obstructive pulmonary disease, unspecified: Secondary | ICD-10-CM | POA: Diagnosis not present

## 2023-12-22 DIAGNOSIS — Z7982 Long term (current) use of aspirin: Secondary | ICD-10-CM | POA: Diagnosis not present

## 2023-12-22 DIAGNOSIS — I2510 Atherosclerotic heart disease of native coronary artery without angina pectoris: Secondary | ICD-10-CM | POA: Diagnosis not present

## 2023-12-22 DIAGNOSIS — Z556 Problems related to health literacy: Secondary | ICD-10-CM | POA: Diagnosis not present

## 2023-12-22 DIAGNOSIS — Z7984 Long term (current) use of oral hypoglycemic drugs: Secondary | ICD-10-CM | POA: Diagnosis not present

## 2023-12-22 DIAGNOSIS — I130 Hypertensive heart and chronic kidney disease with heart failure and stage 1 through stage 4 chronic kidney disease, or unspecified chronic kidney disease: Secondary | ICD-10-CM | POA: Diagnosis not present

## 2023-12-22 DIAGNOSIS — Z85828 Personal history of other malignant neoplasm of skin: Secondary | ICD-10-CM | POA: Diagnosis not present

## 2023-12-22 DIAGNOSIS — I5032 Chronic diastolic (congestive) heart failure: Secondary | ICD-10-CM | POA: Diagnosis not present

## 2023-12-22 DIAGNOSIS — Z9981 Dependence on supplemental oxygen: Secondary | ICD-10-CM | POA: Diagnosis not present

## 2023-12-22 DIAGNOSIS — E538 Deficiency of other specified B group vitamins: Secondary | ICD-10-CM | POA: Diagnosis not present

## 2023-12-23 DIAGNOSIS — N183 Chronic kidney disease, stage 3 unspecified: Secondary | ICD-10-CM | POA: Diagnosis not present

## 2023-12-23 DIAGNOSIS — I480 Paroxysmal atrial fibrillation: Secondary | ICD-10-CM | POA: Diagnosis not present

## 2023-12-23 DIAGNOSIS — Z85828 Personal history of other malignant neoplasm of skin: Secondary | ICD-10-CM | POA: Diagnosis not present

## 2023-12-23 DIAGNOSIS — Z7984 Long term (current) use of oral hypoglycemic drugs: Secondary | ICD-10-CM | POA: Diagnosis not present

## 2023-12-23 DIAGNOSIS — J449 Chronic obstructive pulmonary disease, unspecified: Secondary | ICD-10-CM | POA: Diagnosis not present

## 2023-12-23 DIAGNOSIS — J9611 Chronic respiratory failure with hypoxia: Secondary | ICD-10-CM | POA: Diagnosis not present

## 2023-12-23 DIAGNOSIS — G253 Myoclonus: Secondary | ICD-10-CM | POA: Diagnosis not present

## 2023-12-23 DIAGNOSIS — L03116 Cellulitis of left lower limb: Secondary | ICD-10-CM | POA: Diagnosis not present

## 2023-12-23 DIAGNOSIS — M51369 Other intervertebral disc degeneration, lumbar region without mention of lumbar back pain or lower extremity pain: Secondary | ICD-10-CM | POA: Diagnosis not present

## 2023-12-23 DIAGNOSIS — Z9181 History of falling: Secondary | ICD-10-CM | POA: Diagnosis not present

## 2023-12-23 DIAGNOSIS — E1122 Type 2 diabetes mellitus with diabetic chronic kidney disease: Secondary | ICD-10-CM | POA: Diagnosis not present

## 2023-12-23 DIAGNOSIS — Z7982 Long term (current) use of aspirin: Secondary | ICD-10-CM | POA: Diagnosis not present

## 2023-12-23 DIAGNOSIS — Z9981 Dependence on supplemental oxygen: Secondary | ICD-10-CM | POA: Diagnosis not present

## 2023-12-23 DIAGNOSIS — I130 Hypertensive heart and chronic kidney disease with heart failure and stage 1 through stage 4 chronic kidney disease, or unspecified chronic kidney disease: Secondary | ICD-10-CM | POA: Diagnosis not present

## 2023-12-23 DIAGNOSIS — Z87891 Personal history of nicotine dependence: Secondary | ICD-10-CM | POA: Diagnosis not present

## 2023-12-23 DIAGNOSIS — E873 Alkalosis: Secondary | ICD-10-CM | POA: Diagnosis not present

## 2023-12-23 DIAGNOSIS — I5032 Chronic diastolic (congestive) heart failure: Secondary | ICD-10-CM | POA: Diagnosis not present

## 2023-12-23 DIAGNOSIS — L03115 Cellulitis of right lower limb: Secondary | ICD-10-CM | POA: Diagnosis not present

## 2023-12-23 DIAGNOSIS — I2510 Atherosclerotic heart disease of native coronary artery without angina pectoris: Secondary | ICD-10-CM | POA: Diagnosis not present

## 2023-12-23 DIAGNOSIS — E538 Deficiency of other specified B group vitamins: Secondary | ICD-10-CM | POA: Diagnosis not present

## 2023-12-23 DIAGNOSIS — D509 Iron deficiency anemia, unspecified: Secondary | ICD-10-CM | POA: Diagnosis not present

## 2023-12-23 DIAGNOSIS — Z556 Problems related to health literacy: Secondary | ICD-10-CM | POA: Diagnosis not present

## 2023-12-23 DIAGNOSIS — D696 Thrombocytopenia, unspecified: Secondary | ICD-10-CM | POA: Diagnosis not present

## 2023-12-25 DIAGNOSIS — J449 Chronic obstructive pulmonary disease, unspecified: Secondary | ICD-10-CM | POA: Diagnosis not present

## 2023-12-25 DIAGNOSIS — M51369 Other intervertebral disc degeneration, lumbar region without mention of lumbar back pain or lower extremity pain: Secondary | ICD-10-CM | POA: Diagnosis not present

## 2023-12-25 DIAGNOSIS — E538 Deficiency of other specified B group vitamins: Secondary | ICD-10-CM | POA: Diagnosis not present

## 2023-12-25 DIAGNOSIS — Z7982 Long term (current) use of aspirin: Secondary | ICD-10-CM | POA: Diagnosis not present

## 2023-12-25 DIAGNOSIS — Z9181 History of falling: Secondary | ICD-10-CM | POA: Diagnosis not present

## 2023-12-25 DIAGNOSIS — D509 Iron deficiency anemia, unspecified: Secondary | ICD-10-CM | POA: Diagnosis not present

## 2023-12-25 DIAGNOSIS — D696 Thrombocytopenia, unspecified: Secondary | ICD-10-CM | POA: Diagnosis not present

## 2023-12-25 DIAGNOSIS — L03116 Cellulitis of left lower limb: Secondary | ICD-10-CM | POA: Diagnosis not present

## 2023-12-25 DIAGNOSIS — Z7984 Long term (current) use of oral hypoglycemic drugs: Secondary | ICD-10-CM | POA: Diagnosis not present

## 2023-12-25 DIAGNOSIS — Z87891 Personal history of nicotine dependence: Secondary | ICD-10-CM | POA: Diagnosis not present

## 2023-12-25 DIAGNOSIS — G253 Myoclonus: Secondary | ICD-10-CM | POA: Diagnosis not present

## 2023-12-25 DIAGNOSIS — I5032 Chronic diastolic (congestive) heart failure: Secondary | ICD-10-CM | POA: Diagnosis not present

## 2023-12-25 DIAGNOSIS — Z556 Problems related to health literacy: Secondary | ICD-10-CM | POA: Diagnosis not present

## 2023-12-25 DIAGNOSIS — Z9981 Dependence on supplemental oxygen: Secondary | ICD-10-CM | POA: Diagnosis not present

## 2023-12-25 DIAGNOSIS — I130 Hypertensive heart and chronic kidney disease with heart failure and stage 1 through stage 4 chronic kidney disease, or unspecified chronic kidney disease: Secondary | ICD-10-CM | POA: Diagnosis not present

## 2023-12-25 DIAGNOSIS — N183 Chronic kidney disease, stage 3 unspecified: Secondary | ICD-10-CM | POA: Diagnosis not present

## 2023-12-25 DIAGNOSIS — E873 Alkalosis: Secondary | ICD-10-CM | POA: Diagnosis not present

## 2023-12-25 DIAGNOSIS — I2510 Atherosclerotic heart disease of native coronary artery without angina pectoris: Secondary | ICD-10-CM | POA: Diagnosis not present

## 2023-12-25 DIAGNOSIS — J9611 Chronic respiratory failure with hypoxia: Secondary | ICD-10-CM | POA: Diagnosis not present

## 2023-12-25 DIAGNOSIS — I480 Paroxysmal atrial fibrillation: Secondary | ICD-10-CM | POA: Diagnosis not present

## 2023-12-25 DIAGNOSIS — E1122 Type 2 diabetes mellitus with diabetic chronic kidney disease: Secondary | ICD-10-CM | POA: Diagnosis not present

## 2023-12-25 DIAGNOSIS — Z85828 Personal history of other malignant neoplasm of skin: Secondary | ICD-10-CM | POA: Diagnosis not present

## 2023-12-25 DIAGNOSIS — L03115 Cellulitis of right lower limb: Secondary | ICD-10-CM | POA: Diagnosis not present

## 2023-12-29 DIAGNOSIS — E538 Deficiency of other specified B group vitamins: Secondary | ICD-10-CM | POA: Diagnosis not present

## 2023-12-29 DIAGNOSIS — Z85828 Personal history of other malignant neoplasm of skin: Secondary | ICD-10-CM | POA: Diagnosis not present

## 2023-12-29 DIAGNOSIS — I480 Paroxysmal atrial fibrillation: Secondary | ICD-10-CM | POA: Diagnosis not present

## 2023-12-29 DIAGNOSIS — E873 Alkalosis: Secondary | ICD-10-CM | POA: Diagnosis not present

## 2023-12-29 DIAGNOSIS — N183 Chronic kidney disease, stage 3 unspecified: Secondary | ICD-10-CM | POA: Diagnosis not present

## 2023-12-29 DIAGNOSIS — L03115 Cellulitis of right lower limb: Secondary | ICD-10-CM | POA: Diagnosis not present

## 2023-12-29 DIAGNOSIS — Z87891 Personal history of nicotine dependence: Secondary | ICD-10-CM | POA: Diagnosis not present

## 2023-12-29 DIAGNOSIS — I2510 Atherosclerotic heart disease of native coronary artery without angina pectoris: Secondary | ICD-10-CM | POA: Diagnosis not present

## 2023-12-29 DIAGNOSIS — Z556 Problems related to health literacy: Secondary | ICD-10-CM | POA: Diagnosis not present

## 2023-12-29 DIAGNOSIS — Z9181 History of falling: Secondary | ICD-10-CM | POA: Diagnosis not present

## 2023-12-29 DIAGNOSIS — Z7984 Long term (current) use of oral hypoglycemic drugs: Secondary | ICD-10-CM | POA: Diagnosis not present

## 2023-12-29 DIAGNOSIS — L03116 Cellulitis of left lower limb: Secondary | ICD-10-CM | POA: Diagnosis not present

## 2023-12-29 DIAGNOSIS — I5032 Chronic diastolic (congestive) heart failure: Secondary | ICD-10-CM | POA: Diagnosis not present

## 2023-12-29 DIAGNOSIS — M51369 Other intervertebral disc degeneration, lumbar region without mention of lumbar back pain or lower extremity pain: Secondary | ICD-10-CM | POA: Diagnosis not present

## 2023-12-29 DIAGNOSIS — E1122 Type 2 diabetes mellitus with diabetic chronic kidney disease: Secondary | ICD-10-CM | POA: Diagnosis not present

## 2023-12-29 DIAGNOSIS — G253 Myoclonus: Secondary | ICD-10-CM | POA: Diagnosis not present

## 2023-12-29 DIAGNOSIS — D509 Iron deficiency anemia, unspecified: Secondary | ICD-10-CM | POA: Diagnosis not present

## 2023-12-29 DIAGNOSIS — J449 Chronic obstructive pulmonary disease, unspecified: Secondary | ICD-10-CM | POA: Diagnosis not present

## 2023-12-29 DIAGNOSIS — I130 Hypertensive heart and chronic kidney disease with heart failure and stage 1 through stage 4 chronic kidney disease, or unspecified chronic kidney disease: Secondary | ICD-10-CM | POA: Diagnosis not present

## 2023-12-29 DIAGNOSIS — D696 Thrombocytopenia, unspecified: Secondary | ICD-10-CM | POA: Diagnosis not present

## 2023-12-29 DIAGNOSIS — J9611 Chronic respiratory failure with hypoxia: Secondary | ICD-10-CM | POA: Diagnosis not present

## 2023-12-29 DIAGNOSIS — Z9981 Dependence on supplemental oxygen: Secondary | ICD-10-CM | POA: Diagnosis not present

## 2023-12-29 DIAGNOSIS — Z7982 Long term (current) use of aspirin: Secondary | ICD-10-CM | POA: Diagnosis not present

## 2023-12-30 DIAGNOSIS — Z7984 Long term (current) use of oral hypoglycemic drugs: Secondary | ICD-10-CM | POA: Diagnosis not present

## 2023-12-30 DIAGNOSIS — D509 Iron deficiency anemia, unspecified: Secondary | ICD-10-CM | POA: Diagnosis not present

## 2023-12-30 DIAGNOSIS — I5032 Chronic diastolic (congestive) heart failure: Secondary | ICD-10-CM | POA: Diagnosis not present

## 2023-12-30 DIAGNOSIS — E1122 Type 2 diabetes mellitus with diabetic chronic kidney disease: Secondary | ICD-10-CM | POA: Diagnosis not present

## 2023-12-30 DIAGNOSIS — Z556 Problems related to health literacy: Secondary | ICD-10-CM | POA: Diagnosis not present

## 2023-12-30 DIAGNOSIS — Z85828 Personal history of other malignant neoplasm of skin: Secondary | ICD-10-CM | POA: Diagnosis not present

## 2023-12-30 DIAGNOSIS — J449 Chronic obstructive pulmonary disease, unspecified: Secondary | ICD-10-CM | POA: Diagnosis not present

## 2023-12-30 DIAGNOSIS — I480 Paroxysmal atrial fibrillation: Secondary | ICD-10-CM | POA: Diagnosis not present

## 2023-12-30 DIAGNOSIS — G253 Myoclonus: Secondary | ICD-10-CM | POA: Diagnosis not present

## 2023-12-30 DIAGNOSIS — L03115 Cellulitis of right lower limb: Secondary | ICD-10-CM | POA: Diagnosis not present

## 2023-12-30 DIAGNOSIS — Z7982 Long term (current) use of aspirin: Secondary | ICD-10-CM | POA: Diagnosis not present

## 2023-12-30 DIAGNOSIS — Z87891 Personal history of nicotine dependence: Secondary | ICD-10-CM | POA: Diagnosis not present

## 2023-12-30 DIAGNOSIS — I2510 Atherosclerotic heart disease of native coronary artery without angina pectoris: Secondary | ICD-10-CM | POA: Diagnosis not present

## 2023-12-30 DIAGNOSIS — Z9181 History of falling: Secondary | ICD-10-CM | POA: Diagnosis not present

## 2023-12-30 DIAGNOSIS — M51369 Other intervertebral disc degeneration, lumbar region without mention of lumbar back pain or lower extremity pain: Secondary | ICD-10-CM | POA: Diagnosis not present

## 2023-12-30 DIAGNOSIS — I130 Hypertensive heart and chronic kidney disease with heart failure and stage 1 through stage 4 chronic kidney disease, or unspecified chronic kidney disease: Secondary | ICD-10-CM | POA: Diagnosis not present

## 2023-12-30 DIAGNOSIS — E538 Deficiency of other specified B group vitamins: Secondary | ICD-10-CM | POA: Diagnosis not present

## 2023-12-30 DIAGNOSIS — N183 Chronic kidney disease, stage 3 unspecified: Secondary | ICD-10-CM | POA: Diagnosis not present

## 2023-12-30 DIAGNOSIS — E873 Alkalosis: Secondary | ICD-10-CM | POA: Diagnosis not present

## 2023-12-30 DIAGNOSIS — D696 Thrombocytopenia, unspecified: Secondary | ICD-10-CM | POA: Diagnosis not present

## 2023-12-30 DIAGNOSIS — Z9981 Dependence on supplemental oxygen: Secondary | ICD-10-CM | POA: Diagnosis not present

## 2023-12-30 DIAGNOSIS — J9611 Chronic respiratory failure with hypoxia: Secondary | ICD-10-CM | POA: Diagnosis not present

## 2023-12-30 DIAGNOSIS — L03116 Cellulitis of left lower limb: Secondary | ICD-10-CM | POA: Diagnosis not present

## 2023-12-31 ENCOUNTER — Ambulatory Visit

## 2023-12-31 DIAGNOSIS — I2510 Atherosclerotic heart disease of native coronary artery without angina pectoris: Secondary | ICD-10-CM

## 2023-12-31 DIAGNOSIS — I5032 Chronic diastolic (congestive) heart failure: Secondary | ICD-10-CM | POA: Diagnosis not present

## 2023-12-31 DIAGNOSIS — I480 Paroxysmal atrial fibrillation: Secondary | ICD-10-CM

## 2023-12-31 DIAGNOSIS — E538 Deficiency of other specified B group vitamins: Secondary | ICD-10-CM | POA: Diagnosis not present

## 2023-12-31 DIAGNOSIS — L03115 Cellulitis of right lower limb: Secondary | ICD-10-CM

## 2023-12-31 DIAGNOSIS — D696 Thrombocytopenia, unspecified: Secondary | ICD-10-CM

## 2023-12-31 DIAGNOSIS — I130 Hypertensive heart and chronic kidney disease with heart failure and stage 1 through stage 4 chronic kidney disease, or unspecified chronic kidney disease: Secondary | ICD-10-CM | POA: Diagnosis not present

## 2023-12-31 DIAGNOSIS — G253 Myoclonus: Secondary | ICD-10-CM

## 2023-12-31 DIAGNOSIS — L03116 Cellulitis of left lower limb: Secondary | ICD-10-CM | POA: Diagnosis not present

## 2023-12-31 DIAGNOSIS — E1122 Type 2 diabetes mellitus with diabetic chronic kidney disease: Secondary | ICD-10-CM | POA: Diagnosis not present

## 2023-12-31 DIAGNOSIS — J449 Chronic obstructive pulmonary disease, unspecified: Secondary | ICD-10-CM

## 2023-12-31 DIAGNOSIS — N183 Chronic kidney disease, stage 3 unspecified: Secondary | ICD-10-CM | POA: Diagnosis not present

## 2024-01-01 ENCOUNTER — Other Ambulatory Visit: Payer: Self-pay | Admitting: Family

## 2024-01-05 DIAGNOSIS — Z87891 Personal history of nicotine dependence: Secondary | ICD-10-CM | POA: Diagnosis not present

## 2024-01-05 DIAGNOSIS — D509 Iron deficiency anemia, unspecified: Secondary | ICD-10-CM | POA: Diagnosis not present

## 2024-01-05 DIAGNOSIS — Z85828 Personal history of other malignant neoplasm of skin: Secondary | ICD-10-CM | POA: Diagnosis not present

## 2024-01-05 DIAGNOSIS — J9611 Chronic respiratory failure with hypoxia: Secondary | ICD-10-CM | POA: Diagnosis not present

## 2024-01-05 DIAGNOSIS — L03115 Cellulitis of right lower limb: Secondary | ICD-10-CM | POA: Diagnosis not present

## 2024-01-05 DIAGNOSIS — Z7982 Long term (current) use of aspirin: Secondary | ICD-10-CM | POA: Diagnosis not present

## 2024-01-05 DIAGNOSIS — E1122 Type 2 diabetes mellitus with diabetic chronic kidney disease: Secondary | ICD-10-CM | POA: Diagnosis not present

## 2024-01-05 DIAGNOSIS — E873 Alkalosis: Secondary | ICD-10-CM | POA: Diagnosis not present

## 2024-01-05 DIAGNOSIS — L03116 Cellulitis of left lower limb: Secondary | ICD-10-CM | POA: Diagnosis not present

## 2024-01-05 DIAGNOSIS — Z7984 Long term (current) use of oral hypoglycemic drugs: Secondary | ICD-10-CM | POA: Diagnosis not present

## 2024-01-05 DIAGNOSIS — E538 Deficiency of other specified B group vitamins: Secondary | ICD-10-CM | POA: Diagnosis not present

## 2024-01-05 DIAGNOSIS — D696 Thrombocytopenia, unspecified: Secondary | ICD-10-CM | POA: Diagnosis not present

## 2024-01-05 DIAGNOSIS — I480 Paroxysmal atrial fibrillation: Secondary | ICD-10-CM | POA: Diagnosis not present

## 2024-01-05 DIAGNOSIS — Z556 Problems related to health literacy: Secondary | ICD-10-CM | POA: Diagnosis not present

## 2024-01-05 DIAGNOSIS — I2510 Atherosclerotic heart disease of native coronary artery without angina pectoris: Secondary | ICD-10-CM | POA: Diagnosis not present

## 2024-01-05 DIAGNOSIS — Z9181 History of falling: Secondary | ICD-10-CM | POA: Diagnosis not present

## 2024-01-05 DIAGNOSIS — Z9981 Dependence on supplemental oxygen: Secondary | ICD-10-CM | POA: Diagnosis not present

## 2024-01-05 DIAGNOSIS — J449 Chronic obstructive pulmonary disease, unspecified: Secondary | ICD-10-CM | POA: Diagnosis not present

## 2024-01-05 DIAGNOSIS — M51369 Other intervertebral disc degeneration, lumbar region without mention of lumbar back pain or lower extremity pain: Secondary | ICD-10-CM | POA: Diagnosis not present

## 2024-01-05 DIAGNOSIS — N183 Chronic kidney disease, stage 3 unspecified: Secondary | ICD-10-CM | POA: Diagnosis not present

## 2024-01-05 DIAGNOSIS — I5032 Chronic diastolic (congestive) heart failure: Secondary | ICD-10-CM | POA: Diagnosis not present

## 2024-01-05 DIAGNOSIS — G253 Myoclonus: Secondary | ICD-10-CM | POA: Diagnosis not present

## 2024-01-05 DIAGNOSIS — I130 Hypertensive heart and chronic kidney disease with heart failure and stage 1 through stage 4 chronic kidney disease, or unspecified chronic kidney disease: Secondary | ICD-10-CM | POA: Diagnosis not present

## 2024-01-06 DIAGNOSIS — J449 Chronic obstructive pulmonary disease, unspecified: Secondary | ICD-10-CM | POA: Diagnosis not present

## 2024-01-06 DIAGNOSIS — Z9981 Dependence on supplemental oxygen: Secondary | ICD-10-CM | POA: Diagnosis not present

## 2024-01-06 DIAGNOSIS — L03116 Cellulitis of left lower limb: Secondary | ICD-10-CM | POA: Diagnosis not present

## 2024-01-06 DIAGNOSIS — Z556 Problems related to health literacy: Secondary | ICD-10-CM | POA: Diagnosis not present

## 2024-01-06 DIAGNOSIS — Z7984 Long term (current) use of oral hypoglycemic drugs: Secondary | ICD-10-CM | POA: Diagnosis not present

## 2024-01-06 DIAGNOSIS — I2510 Atherosclerotic heart disease of native coronary artery without angina pectoris: Secondary | ICD-10-CM | POA: Diagnosis not present

## 2024-01-06 DIAGNOSIS — M51369 Other intervertebral disc degeneration, lumbar region without mention of lumbar back pain or lower extremity pain: Secondary | ICD-10-CM | POA: Diagnosis not present

## 2024-01-06 DIAGNOSIS — G253 Myoclonus: Secondary | ICD-10-CM | POA: Diagnosis not present

## 2024-01-06 DIAGNOSIS — N183 Chronic kidney disease, stage 3 unspecified: Secondary | ICD-10-CM | POA: Diagnosis not present

## 2024-01-06 DIAGNOSIS — I480 Paroxysmal atrial fibrillation: Secondary | ICD-10-CM | POA: Diagnosis not present

## 2024-01-06 DIAGNOSIS — I5032 Chronic diastolic (congestive) heart failure: Secondary | ICD-10-CM | POA: Diagnosis not present

## 2024-01-06 DIAGNOSIS — Z9181 History of falling: Secondary | ICD-10-CM | POA: Diagnosis not present

## 2024-01-06 DIAGNOSIS — L03115 Cellulitis of right lower limb: Secondary | ICD-10-CM | POA: Diagnosis not present

## 2024-01-06 DIAGNOSIS — Z7982 Long term (current) use of aspirin: Secondary | ICD-10-CM | POA: Diagnosis not present

## 2024-01-06 DIAGNOSIS — E873 Alkalosis: Secondary | ICD-10-CM | POA: Diagnosis not present

## 2024-01-06 DIAGNOSIS — E1122 Type 2 diabetes mellitus with diabetic chronic kidney disease: Secondary | ICD-10-CM | POA: Diagnosis not present

## 2024-01-06 DIAGNOSIS — J9611 Chronic respiratory failure with hypoxia: Secondary | ICD-10-CM | POA: Diagnosis not present

## 2024-01-06 DIAGNOSIS — Z87891 Personal history of nicotine dependence: Secondary | ICD-10-CM | POA: Diagnosis not present

## 2024-01-06 DIAGNOSIS — E538 Deficiency of other specified B group vitamins: Secondary | ICD-10-CM | POA: Diagnosis not present

## 2024-01-06 DIAGNOSIS — D696 Thrombocytopenia, unspecified: Secondary | ICD-10-CM | POA: Diagnosis not present

## 2024-01-06 DIAGNOSIS — Z85828 Personal history of other malignant neoplasm of skin: Secondary | ICD-10-CM | POA: Diagnosis not present

## 2024-01-06 DIAGNOSIS — D509 Iron deficiency anemia, unspecified: Secondary | ICD-10-CM | POA: Diagnosis not present

## 2024-01-06 DIAGNOSIS — I130 Hypertensive heart and chronic kidney disease with heart failure and stage 1 through stage 4 chronic kidney disease, or unspecified chronic kidney disease: Secondary | ICD-10-CM | POA: Diagnosis not present

## 2024-01-09 ENCOUNTER — Other Ambulatory Visit: Payer: Self-pay | Admitting: Family

## 2024-01-09 DIAGNOSIS — I10 Essential (primary) hypertension: Secondary | ICD-10-CM

## 2024-01-26 ENCOUNTER — Telehealth: Payer: Self-pay

## 2024-01-26 NOTE — Telephone Encounter (Unsigned)
 Copied from CRM [id number]. Topic: Appointments - Appointment Scheduling >> Jan 26, 2024 12:36 PM Victoria B wrote: Patient's wife called to reschedule AWV for patient. Please cb

## 2024-01-27 ENCOUNTER — Ambulatory Visit (INDEPENDENT_AMBULATORY_CARE_PROVIDER_SITE_OTHER)

## 2024-01-27 VITALS — BP 131/69 | HR 84 | Ht 69.0 in | Wt 147.0 lb

## 2024-01-27 DIAGNOSIS — Z0000 Encounter for general adult medical examination without abnormal findings: Secondary | ICD-10-CM | POA: Diagnosis not present

## 2024-01-27 NOTE — Progress Notes (Deleted)
 Subjective:   Marcus Carr is a 83 y.o. male who presents for a Medicare Annual Wellness Visit.  Allergies (verified) Penicillins   History: Past Medical History:  Diagnosis Date   Abscess, jaw    Anxiety    Back pain    chronic back pain treated by Dr. Bonner   Chronic kidney disease    Chronic mastoiditis of both sides 11/07/2014   Cigarette smoker 11/07/2014   COPD (chronic obstructive pulmonary disease) (HCC)    Gout    Hyperlipidemia    Hypertension    Morbid obesity (HCC) 11/07/2014   Osteomyelitis of mandible 11/07/2014   Thyroid  nodule 11/07/2014   Past Surgical History:  Procedure Laterality Date   APPENDECTOMY     TONSILLECTOMY     Family History  Problem Relation Age of Onset   Cancer Mother        esophageal   Heart attack Father 21   Social History   Occupational History   Occupation: Retired  Tobacco Use   Smoking status: Former    Current packs/day: 0.00    Average packs/day: 1.5 packs/day for 50.0 years (75.0 ttl pk-yrs)    Types: Cigarettes    Start date: 10/23/1964    Quit date: 10/24/2014    Years since quitting: 9.2   Smokeless tobacco: Never  Vaping Use   Vaping status: Never Used  Substance and Sexual Activity   Alcohol use: No    Alcohol/week: 0.0 standard drinks of alcohol   Drug use: No   Sexual activity: Not Currently   Tobacco Counseling Counseling given: Yes  SDOH Screenings   Food Insecurity: No Food Insecurity (01/27/2024)  Housing: Unknown (01/27/2024)  Transportation Needs: No Transportation Needs (01/27/2024)  Utilities: Not At Risk (01/27/2024)  Alcohol Screen: Low Risk  (01/21/2023)  Depression (PHQ2-9): Low Risk  (01/27/2024)  Financial Resource Strain: Patient Declined (04/27/2023)   Received from [facility]  Physical Activity: Insufficiently Active (01/27/2024)  Social Connections: Moderately Isolated (01/27/2024)  Stress: No Stress Concern Present (01/27/2024)  Tobacco Use: Medium Risk (01/27/2024)  Health  Literacy: Adequate Health Literacy (01/27/2024)   Depression Screen    01/27/2024    2:45 PM 12/14/2023    2:27 PM 02/18/2023   10:15 AM 02/02/2023   12:01 PM 01/21/2023    1:47 PM 01/17/2022   11:53 AM 01/16/2022   10:29 AM  PHQ 2/9 Scores  PHQ - 2 Score 0 0 3 1 0 0 0  PHQ- 9 Score 0 2 11 5         Goals Addressed   None    Visit info / Clinical Intake: Medicare Wellness Visit Type:: Subsequent Annual Wellness Visit Medicare Wellness Visit Mode:: Telephone If telephone:: video declined If telephone or video:: vitals recorded from last visit Interpreter Needed?: No Pre-visit prep was completed: yes AWV questionnaire completed by patient prior to visit?: no Living arrangements:: lives with spouse/significant other Patient's Overall Health Status Rating: very good Typical amount of pain: none Does pain affect daily life?: no Are you currently prescribed opioids?: no  Dietary Habits and Nutritional Risks How many meals a day?: 3 Eats fruit and vegetables daily?: yes Most meals are obtained by: preparing own meals Diabetic:: no  Functional Status Activities of Daily Living (to include ambulation/medication): Independent Ambulation: Independent with device- listed below Home Assistive Devices/Equipment: Walker (specify Type); Wheelchair Medication Administration: Independent Home Management: Independent Manage your own finances?: (!) no (pt wife) Primary transportation is: family/friends (pt's grandson) Concerns about hearing?: no (  have hearing dif/don't need hearing aids)  Fall Screening Falls in the past year?: 0 Number of falls in past year: 0 Was there an injury with Fall?: 0 Fall Risk Category Calculator: 0 Patient Fall Risk Level: Low Fall Risk  Fall Risk Patient at Risk for Falls Due to: No Fall Risks Fall risk Follow up: Falls evaluation completed; Education provided  Home and Transportation Safety: All rugs have non-skid backing?: yes All stairs or steps  have railings?: (!) no Grab bars in the bathtub or shower?: yes Have non-skid surface in bathtub or shower?: yes Good home lighting?: yes Regular seat belt use?: yes Hospital stays in the last year:: no  Cognitive Assessment Difficulty concentrating, remembering, or making decisions? : yes (a little bit) Will 6CIT or Mini Cog be Completed: yes What year is it?: 0 points What month is it?: 0 points Give patient an address phrase to remember (5 components): [address] [place], [place] About what time is it?: 0 points Count backwards from 20 to 1: 0 points Say the months of the year in reverse: 4 points Repeat the address phrase from earlier: 0 points 6 CIT Score: 4 points  Advance Directives (For Healthcare) Does Patient Have a Medical Advance Directive?: Yes Type of Advance Directive: Healthcare Power of [place]; Living will Copy of Healthcare Power of Attorney in Chart?: No - copy requested Copy of Living Will in Chart?: No - copy requested  Reviewed/Updated  Reviewed/Updated: All; Medical History; Surgical History; Family History; Medications; Allergies; Care Teams; Patient Goals        Objective:    Today's Vitals   01/27/24 1435  BP: 131/69  Pulse: 84  Weight: 147 lb (66.7 kg)  Height: 5' 9 (1.753 m)   Body mass index is 21.71 kg/m.  Current Medications (verified) Outpatient Encounter Medications as of 01/27/2024  Medication Sig   Accu-Chek Softclix Lancets lancets USE UP TO 4 TIMES DAILY AS DIRECTED (E10.9, E11.9)   albuterol  (VENTOLIN  HFA) 108 (90 Base) MCG/ACT inhaler INHALE 2 PUFFS INTO THE LUNGS EVERY 6 HOURS AS NEEDED FOR WHEEZE OR SHORTNESS OF BREATH   albuterol  (VENTOLIN  HFA) 108 (90 Base) MCG/ACT inhaler Inhale 1-2 puffs into the lungs every 6 (six) hours as needed for wheezing or shortness of breath.   aspirin  EC 81 MG tablet Take 81 mg by mouth daily. Swallow whole.   atorvastatin  (LIPITOR) 20 MG tablet Take 1 tablet (20 mg total) by mouth daily.   blood  glucose meter kit and supplies KIT Dispense based on patient and insurance preference. Use up to four times daily as directed.   blood glucose meter kit and supplies Dispense based on patient and insurance preference. Use up to four times daily as directed. (FOR ICD-10 E10.9, E11.9).   cetirizine  (ZYRTEC ) 10 MG tablet Take 1 tablet (10 mg total) by mouth daily.   cholecalciferol  (VITAMIN D) 400 UNITS TABS tablet Take 800 Units by mouth daily.   citalopram  (CELEXA ) 20 MG tablet Take 1 tablet (20 mg total) by mouth daily.   diclofenac  sodium (VOLTAREN ) 1 % GEL Apply 4 g topically 4 (four) times daily. To left knee for pain   fish oil-omega-3 fatty acids  1000 MG capsule Take 1 g by mouth daily.   fluticasone  (FLONASE ) 50 MCG/ACT nasal spray USE 2 SPRAYS IN BOTH NOSTRILS  DAILY   furosemide  (LASIX ) 20 MG tablet Take 2 tablets (40 mg total) by mouth 2 (two) times daily. (With breakfast and lunch)   gabapentin  (NEURONTIN ) 100  MG capsule Take 1 capsule (100 mg total) by mouth 3 (three) times daily.   glucose blood (ACCU-CHEK GUIDE) test strip Test BS up to 4 times daily Dx E11.42   hydrALAZINE  (APRESOLINE ) 50 MG tablet Take 100 mg by mouth every 8 (eight) hours as needed.   indomethacin  (INDOCIN ) 50 MG capsule TAKE 1 CAPSULE BY MOUTH THREE TIMES A DAY AS NEEDED   oxyCODONE -acetaminophen  (PERCOCET) 10-325 MG per tablet Take 1 tablet by mouth every 12 (twelve) hours as needed. (Patient taking differently: Take 1 tablet by mouth every 12 (twelve) hours as needed for pain.)   Specialty Vitamins Products (MAGNESIUM , AMINO ACID CHELATE,) 133 MG tablet Take 1 tablet by mouth daily.   verapamil  (CALAN -SR) 120 MG CR tablet TAKE 1 TABLET BY MOUTH AT  BEDTIME   No facility-administered encounter medications on file as of 01/27/2024.   Hearing/Vision screen Hearing Screening - Comments:: Pt have hearing dif Vision Screening - Comments:: Pt wear glasses/[facility] in [place], [place]/last ov has been 4-5 yrs Immunizations  and Health Maintenance Health Maintenance  Topic Date Due   COVID-19 Vaccine (3 - Moderna risk series) 06/11/2019   Zoster Vaccines- Shingrix  (2 of 2) 01/01/2023   Medicare Annual Wellness (AWV)  01/26/2025   DTaP/Tdap/Td (2 - Td or Tdap) 07/09/2027   Pneumococcal Vaccine: 50+ Years  Completed   Influenza Vaccine  Completed   Meningococcal B Vaccine  Aged Out   Lung Cancer Screening  Discontinued   Hepatitis C Screening  Discontinued        Assessment/Plan:  This is a routine wellness examination for Marcus Carr.  Patient Care Team: Lavell Bari LABOR, FNP as PCP - General (Nurse Practitioner) Jadine, Verneita Loader (Inactive) as Surgeon Cathern Alm DEL, MD as Attending Physician (Infectious Diseases)  I have personally reviewed and noted the following in the patient's chart:   Medical and social history Use of alcohol, tobacco or illicit drugs  Current medications and supplements including opioid prescriptions. Functional ability and status Nutritional status Physical activity Advanced directives List of other physicians Hospitalizations, surgeries, and ER visits in previous 12 months Vitals Screenings to include cognitive, depression, and falls Referrals and appointments  No orders of the defined types were placed in this encounter.  In addition, I have reviewed and discussed with patient certain preventive protocols, quality metrics, and best practice recommendations. A written personalized care plan for preventive services as well as general preventive health recommendations were provided to patient.   Ozie Ned, CMA   01/27/2024   Return in 1 year (on 01/26/2025).  After Visit Summary: (MyChart) Due to this being a telephonic visit, the after visit summary with patients personalized plan was offered to patient via MyChart   Nurse Notes: pt is aware and due the following: 2nd shingles vaccine/covid

## 2024-02-10 NOTE — Progress Notes (Addendum)
 Subjective:   Marcus Carr is a 83 y.o. male who presents for a Medicare Annual Wellness Visit.  I connected with  Marcus Carr on 01/27/24 by a audio enabled telemedicine application and verified that I am speaking with the correct person using two identifiers.  Patient Location: Home  Provider Location: Office/Clinic  Persons Participating in Visit: Patient.  I discussed the limitations of evaluation and management by telemedicine. The patient expressed understanding and agreed to proceed.   Vital Signs: Because this visit was a virtual/telehealth visit, some criteria may be missing or patient reported. Any vitals not documented were not able to be obtained and vitals that have been documented are patient reported.     Allergies (verified) Penicillins   History: Past Medical History:  Diagnosis Date   Abscess, jaw    Anxiety    Back pain    chronic back pain treated by Dr. Bonner   Chronic kidney disease    Chronic mastoiditis of both sides 11/07/2014   Cigarette smoker 11/07/2014   COPD (chronic obstructive pulmonary disease) (HCC)    Gout    Hyperlipidemia    Hypertension    Morbid obesity (HCC) 11/07/2014   Osteomyelitis of mandible 11/07/2014   Thyroid  nodule 11/07/2014   Past Surgical History:  Procedure Laterality Date   APPENDECTOMY     TONSILLECTOMY     Family History  Problem Relation Age of Onset   Cancer Mother        esophageal   Heart attack Father 41   Social History   Occupational History   Occupation: Retired  Tobacco Use   Smoking status: Former    Current packs/day: 0.00    Average packs/day: 1.5 packs/day for 50.0 years (75.0 ttl pk-yrs)    Types: Cigarettes    Start date: 10/23/1964    Quit date: 10/24/2014    Years since quitting: 9.3   Smokeless tobacco: Never  Vaping Use   Vaping status: Never Used  Substance and Sexual Activity   Alcohol use: No    Alcohol/week: 0.0 standard drinks of alcohol   Drug use: No    Sexual activity: Not Currently   Tobacco Counseling Counseling given: Yes  SDOH Screenings   Food Insecurity: No Food Insecurity (01/27/2024)  Housing: Unknown (01/27/2024)  Transportation Needs: No Transportation Needs (01/27/2024)  Utilities: Not At Risk (01/27/2024)  Alcohol Screen: Low Risk  (01/21/2023)  Depression (PHQ2-9): Low Risk  (01/27/2024)  Financial Resource Strain: Patient Declined (04/27/2023)   Received from [facility]  Physical Activity: Insufficiently Active (01/27/2024)  Social Connections: Moderately Isolated (01/27/2024)  Stress: No Stress Concern Present (01/27/2024)  Tobacco Use: Medium Risk (01/27/2024)  Health Literacy: Adequate Health Literacy (01/27/2024)   Depression Screen    01/27/2024    2:45 PM 12/14/2023    2:27 PM 02/18/2023   10:15 AM 02/02/2023   12:01 PM 01/21/2023    1:47 PM 01/17/2022   11:53 AM 01/16/2022   10:29 AM  PHQ 2/9 Scores  PHQ - 2 Score 0 0 3 1 0 0 0  PHQ- 9 Score 0  2  11  5          Data saved with a previous flowsheet row definition     Goals Addressed   None    Visit info / Clinical Intake: Medicare Wellness Visit Type:: Subsequent Annual Wellness Visit Medicare Wellness Visit Mode:: Telephone If telephone:: video declined Because this visit was a virtual/telehealth visit:: vitals recorded from last visit Interpreter  Needed?: No Pre-visit prep was completed: yes AWV questionnaire completed by patient prior to visit?: no Living arrangements:: lives with spouse/significant other Patient's Overall Health Status Rating: very good Typical amount of pain: none Does pain affect daily life?: no Are you currently prescribed opioids?: no  Dietary Habits and Nutritional Risks How many meals a day?: 3 Eats fruit and vegetables daily?: yes Most meals are obtained by: preparing own meals Diabetic:: no  Functional Status Activities of Daily Living (to include ambulation/medication): Independent Ambulation: Independent with  device- listed below Home Assistive Devices/Equipment: Walker (specify Type); Wheelchair Medication Administration: Independent Home Management: Independent Manage your own finances?: (!) no (pt wife) Primary transportation is: family/friends (pt's grandson) Concerns about hearing?: no (have hearing dif/don't need hearing aids)  Fall Screening Falls in the past year?: 0 Number of falls in past year: 0 Was there an injury with Fall?: 0 Fall Risk Category Calculator: 0 Patient Fall Risk Level: Low Fall Risk  Fall Risk Patient at Risk for Falls Due to: No Fall Risks Fall risk Follow up: Falls evaluation completed; Education provided  Home and Transportation Safety: All rugs have non-skid backing?: yes All stairs or steps have railings?: (!) no Grab bars in the bathtub or shower?: yes Have non-skid surface in bathtub or shower?: yes Good home lighting?: yes Regular seat belt use?: yes Hospital stays in the last year:: no  Cognitive Assessment Difficulty concentrating, remembering, or making decisions? : yes (a little bit) Will 6CIT or Mini Cog be Completed: yes What year is it?: 0 points What month is it?: 0 points Give patient an address phrase to remember (5 components): [address] [place], [place] About what time is it?: 0 points Count backwards from 20 to 1: 0 points Say the months of the year in reverse: 4 points Repeat the address phrase from earlier: 0 points 6 CIT Score: 4 points  Advance Directives (For Healthcare) Does Patient Have a Medical Advance Directive?: Yes Type of Advance Directive: Healthcare Power of [place]; Living will Copy of Healthcare Power of Attorney in Chart?: No - copy requested Copy of Living Will in Chart?: No - copy requested  Reviewed/Updated  Reviewed/Updated: All; Medical History; Surgical History; Family History; Medications; Allergies; Care Teams; Patient Goals        Objective:    Today's Vitals   01/27/24 1435  BP: 131/69  Pulse:  84  Weight: 147 lb (66.7 kg)  Height: 5' 9 (1.753 m)   Body mass index is 21.71 kg/m.  Current Medications (verified) Outpatient Encounter Medications as of 01/27/2024  Medication Sig   Accu-Chek Softclix Lancets lancets USE UP TO 4 TIMES DAILY AS DIRECTED (E10.9, E11.9)   albuterol  (VENTOLIN  HFA) 108 (90 Base) MCG/ACT inhaler INHALE 2 PUFFS INTO THE LUNGS EVERY 6 HOURS AS NEEDED FOR WHEEZE OR SHORTNESS OF BREATH   albuterol  (VENTOLIN  HFA) 108 (90 Base) MCG/ACT inhaler Inhale 1-2 puffs into the lungs every 6 (six) hours as needed for wheezing or shortness of breath.   aspirin  EC 81 MG tablet Take 81 mg by mouth daily. Swallow whole.   atorvastatin  (LIPITOR) 20 MG tablet Take 1 tablet (20 mg total) by mouth daily.   blood glucose meter kit and supplies KIT Dispense based on patient and insurance preference. Use up to four times daily as directed.   blood glucose meter kit and supplies Dispense based on patient and insurance preference. Use up to four times daily as directed. (FOR ICD-10 E10.9, E11.9).   cetirizine  (ZYRTEC ) 10  MG tablet Take 1 tablet (10 mg total) by mouth daily.   cholecalciferol  (VITAMIN D) 400 UNITS TABS tablet Take 800 Units by mouth daily.   citalopram  (CELEXA ) 20 MG tablet Take 1 tablet (20 mg total) by mouth daily.   diclofenac  sodium (VOLTAREN ) 1 % GEL Apply 4 g topically 4 (four) times daily. To left knee for pain   fish oil-omega-3 fatty acids  1000 MG capsule Take 1 g by mouth daily.   fluticasone  (FLONASE ) 50 MCG/ACT nasal spray USE 2 SPRAYS IN BOTH NOSTRILS  DAILY   furosemide  (LASIX ) 20 MG tablet Take 2 tablets (40 mg total) by mouth 2 (two) times daily. (With breakfast and lunch)   gabapentin  (NEURONTIN ) 100 MG capsule Take 1 capsule (100 mg total) by mouth 3 (three) times daily.   glucose blood (ACCU-CHEK GUIDE) test strip Test BS up to 4 times daily Dx E11.42   hydrALAZINE  (APRESOLINE ) 50 MG tablet Take 100 mg by mouth every 8 (eight) hours as needed.    indomethacin  (INDOCIN ) 50 MG capsule TAKE 1 CAPSULE BY MOUTH THREE TIMES A DAY AS NEEDED   oxyCODONE -acetaminophen  (PERCOCET) 10-325 MG per tablet Take 1 tablet by mouth every 12 (twelve) hours as needed. (Patient taking differently: Take 1 tablet by mouth every 12 (twelve) hours as needed for pain.)   Specialty Vitamins Products (MAGNESIUM , AMINO ACID CHELATE,) 133 MG tablet Take 1 tablet by mouth daily.   verapamil  (CALAN -SR) 120 MG CR tablet TAKE 1 TABLET BY MOUTH AT  BEDTIME   No facility-administered encounter medications on file as of 01/27/2024.   Hearing/Vision screen Hearing Screening - Comments:: Pt have hearing dif Vision Screening - Comments:: Pt wear glasses/[facility] in [place], [place]/last ov has been 4-5 yrs Immunizations and Health Maintenance Health Maintenance  Topic Date Due   COVID-19 Vaccine (3 - Moderna risk series) 06/11/2019   Zoster Vaccines- Shingrix  (2 of 2) 01/01/2023   Medicare Annual Wellness (AWV)  01/26/2025   DTaP/Tdap/Td (2 - Td or Tdap) 07/09/2027   Pneumococcal Vaccine: 50+ Years  Completed   Influenza Vaccine  Completed   Meningococcal B Vaccine  Aged Out   Lung Cancer Screening  Discontinued   Hepatitis C Screening  Discontinued        Assessment/Plan:  This is a routine wellness examination for Marcus Carr.  Patient Care Team: Lavell Bari LABOR, FNP as PCP - General (Nurse Practitioner) Jadine, Verneita Loader (Inactive) as Surgeon Cathern Alm DEL, MD as Attending Physician (Infectious Diseases)  I have personally reviewed and noted the following in the patient's chart:   Medical and social history Use of alcohol, tobacco or illicit drugs  Current medications and supplements including opioid prescriptions. Functional ability and status Nutritional status Physical activity Advanced directives List of other physicians Hospitalizations, surgeries, and ER visits in previous 12 months Vitals Screenings to include cognitive, depression, and  falls Referrals and appointments  No orders of the defined types were placed in this encounter.  In addition, I have reviewed and discussed with patient certain preventive protocols, quality metrics, and best practice recommendations. A written personalized care plan for preventive services as well as general preventive health recommendations were provided to patient.   Ozie Ned, CMA   02/10/2024   Return in 1 year (on 01/26/2025).  After Visit Summary: (MyChart) Due to this being a telephonic visit, the after visit summary with patients personalized plan was offered to patient via MyChart   Nurse Notes: pt is aware and due the following: 2nd shingles  vaccine/covid

## 2024-03-28 ENCOUNTER — Other Ambulatory Visit: Payer: Self-pay | Admitting: Family

## 2024-03-28 DIAGNOSIS — I10 Essential (primary) hypertension: Secondary | ICD-10-CM

## 2024-06-13 ENCOUNTER — Ambulatory Visit: Payer: Self-pay | Admitting: Family

## 2025-01-30 ENCOUNTER — Ambulatory Visit
